# Patient Record
Sex: Female | Born: 1940 | Race: Black or African American | Hispanic: No | Smoking: Former smoker
Health system: Southern US, Community
[De-identification: ages and names within clinical notes are randomized; demographics above are authoritative.]

## PROBLEM LIST (undated history)

## (undated) DIAGNOSIS — C50919 Malignant neoplasm of unspecified site of unspecified female breast: Secondary | ICD-10-CM

## (undated) DIAGNOSIS — B952 Enterococcus as the cause of diseases classified elsewhere: Secondary | ICD-10-CM

## (undated) DIAGNOSIS — J449 Chronic obstructive pulmonary disease, unspecified: Secondary | ICD-10-CM

## (undated) DIAGNOSIS — I10 Essential (primary) hypertension: Secondary | ICD-10-CM

## (undated) DIAGNOSIS — E1122 Type 2 diabetes mellitus with diabetic chronic kidney disease: Secondary | ICD-10-CM

## (undated) DIAGNOSIS — N289 Disorder of kidney and ureter, unspecified: Secondary | ICD-10-CM

## (undated) DIAGNOSIS — I5022 Chronic systolic (congestive) heart failure: Secondary | ICD-10-CM

## (undated) DIAGNOSIS — I2510 Atherosclerotic heart disease of native coronary artery without angina pectoris: Secondary | ICD-10-CM

## (undated) DIAGNOSIS — M1990 Unspecified osteoarthritis, unspecified site: Secondary | ICD-10-CM

## (undated) DIAGNOSIS — K219 Gastro-esophageal reflux disease without esophagitis: Secondary | ICD-10-CM

## (undated) DIAGNOSIS — N186 End stage renal disease: Secondary | ICD-10-CM

## (undated) DIAGNOSIS — N189 Chronic kidney disease, unspecified: Secondary | ICD-10-CM

## (undated) DIAGNOSIS — R7881 Bacteremia: Secondary | ICD-10-CM

## (undated) DIAGNOSIS — G4730 Sleep apnea, unspecified: Secondary | ICD-10-CM

## (undated) HISTORY — DX: Enterococcus as the cause of diseases classified elsewhere: B95.2

## (undated) HISTORY — DX: Bacteremia: R78.81

## (undated) HISTORY — PX: BREAST SURGERY: SHX581

## (undated) HISTORY — DX: Atherosclerotic heart disease of native coronary artery without angina pectoris: I25.10

---

## 2010-03-24 DIAGNOSIS — I10 Essential (primary) hypertension: Secondary | ICD-10-CM | POA: Diagnosis present

## 2010-03-24 DIAGNOSIS — N289 Disorder of kidney and ureter, unspecified: Secondary | ICD-10-CM | POA: Insufficient documentation

## 2010-11-21 DIAGNOSIS — E119 Type 2 diabetes mellitus without complications: Secondary | ICD-10-CM | POA: Insufficient documentation

## 2010-11-21 DIAGNOSIS — N184 Chronic kidney disease, stage 4 (severe): Secondary | ICD-10-CM | POA: Insufficient documentation

## 2010-11-21 DIAGNOSIS — R609 Edema, unspecified: Secondary | ICD-10-CM | POA: Insufficient documentation

## 2012-07-15 DIAGNOSIS — C50412 Malignant neoplasm of upper-outer quadrant of left female breast: Secondary | ICD-10-CM | POA: Diagnosis present

## 2012-08-11 DIAGNOSIS — M1990 Unspecified osteoarthritis, unspecified site: Secondary | ICD-10-CM | POA: Diagnosis present

## 2012-08-18 DIAGNOSIS — Z9012 Acquired absence of left breast and nipple: Secondary | ICD-10-CM | POA: Insufficient documentation

## 2012-11-05 DIAGNOSIS — T451X5A Adverse effect of antineoplastic and immunosuppressive drugs, initial encounter: Secondary | ICD-10-CM | POA: Insufficient documentation

## 2012-11-06 DIAGNOSIS — Z418 Encounter for other procedures for purposes other than remedying health state: Secondary | ICD-10-CM | POA: Insufficient documentation

## 2012-12-12 DIAGNOSIS — G4733 Obstructive sleep apnea (adult) (pediatric): Secondary | ICD-10-CM | POA: Insufficient documentation

## 2012-12-12 DIAGNOSIS — J90 Pleural effusion, not elsewhere classified: Secondary | ICD-10-CM | POA: Insufficient documentation

## 2013-06-25 DIAGNOSIS — R911 Solitary pulmonary nodule: Secondary | ICD-10-CM | POA: Insufficient documentation

## 2013-06-25 DIAGNOSIS — R0600 Dyspnea, unspecified: Secondary | ICD-10-CM | POA: Diagnosis present

## 2014-08-02 DIAGNOSIS — I890 Lymphedema, not elsewhere classified: Secondary | ICD-10-CM | POA: Insufficient documentation

## 2014-10-12 ENCOUNTER — Inpatient Hospital Stay (HOSPITAL_COMMUNITY): Payer: Medicare (Managed Care)

## 2014-10-12 ENCOUNTER — Other Ambulatory Visit (HOSPITAL_COMMUNITY): Payer: Medicare Other

## 2014-10-12 ENCOUNTER — Emergency Department (HOSPITAL_COMMUNITY): Payer: Medicare (Managed Care)

## 2014-10-12 ENCOUNTER — Encounter (HOSPITAL_COMMUNITY): Payer: Self-pay | Admitting: Emergency Medicine

## 2014-10-12 ENCOUNTER — Inpatient Hospital Stay (HOSPITAL_COMMUNITY)
Admission: EM | Admit: 2014-10-12 | Discharge: 2014-10-27 | DRG: 264 | Disposition: A | Discharge status: Home Health | Payer: Medicare (Managed Care) | Attending: Internal Medicine | Admitting: Internal Medicine

## 2014-10-12 DIAGNOSIS — I472 Ventricular tachycardia: Secondary | ICD-10-CM | POA: Diagnosis not present

## 2014-10-12 DIAGNOSIS — E1122 Type 2 diabetes mellitus with diabetic chronic kidney disease: Secondary | ICD-10-CM | POA: Diagnosis present

## 2014-10-12 DIAGNOSIS — K219 Gastro-esophageal reflux disease without esophagitis: Secondary | ICD-10-CM | POA: Diagnosis present

## 2014-10-12 DIAGNOSIS — N141 Nephropathy induced by other drugs, medicaments and biological substances: Secondary | ICD-10-CM | POA: Diagnosis not present

## 2014-10-12 DIAGNOSIS — I214 Non-ST elevation (NSTEMI) myocardial infarction: Secondary | ICD-10-CM | POA: Diagnosis not present

## 2014-10-12 DIAGNOSIS — K761 Chronic passive congestion of liver: Secondary | ICD-10-CM | POA: Diagnosis present

## 2014-10-12 DIAGNOSIS — N189 Chronic kidney disease, unspecified: Secondary | ICD-10-CM | POA: Diagnosis not present

## 2014-10-12 DIAGNOSIS — C50912 Malignant neoplasm of unspecified site of left female breast: Secondary | ICD-10-CM

## 2014-10-12 DIAGNOSIS — Z79899 Other long term (current) drug therapy: Secondary | ICD-10-CM | POA: Diagnosis not present

## 2014-10-12 DIAGNOSIS — N186 End stage renal disease: Secondary | ICD-10-CM | POA: Diagnosis not present

## 2014-10-12 DIAGNOSIS — N179 Acute kidney failure, unspecified: Secondary | ICD-10-CM | POA: Insufficient documentation

## 2014-10-12 DIAGNOSIS — R55 Syncope and collapse: Secondary | ICD-10-CM | POA: Diagnosis present

## 2014-10-12 DIAGNOSIS — I5023 Acute on chronic systolic (congestive) heart failure: Secondary | ICD-10-CM | POA: Diagnosis present

## 2014-10-12 DIAGNOSIS — R52 Pain, unspecified: Secondary | ICD-10-CM | POA: Diagnosis not present

## 2014-10-12 DIAGNOSIS — Z923 Personal history of irradiation: Secondary | ICD-10-CM | POA: Diagnosis not present

## 2014-10-12 DIAGNOSIS — G4733 Obstructive sleep apnea (adult) (pediatric): Secondary | ICD-10-CM | POA: Diagnosis present

## 2014-10-12 DIAGNOSIS — R079 Chest pain, unspecified: Secondary | ICD-10-CM | POA: Diagnosis present

## 2014-10-12 DIAGNOSIS — Z823 Family history of stroke: Secondary | ICD-10-CM

## 2014-10-12 DIAGNOSIS — Z9221 Personal history of antineoplastic chemotherapy: Secondary | ICD-10-CM

## 2014-10-12 DIAGNOSIS — M1612 Unilateral primary osteoarthritis, left hip: Secondary | ICD-10-CM | POA: Diagnosis present

## 2014-10-12 DIAGNOSIS — J449 Chronic obstructive pulmonary disease, unspecified: Secondary | ICD-10-CM | POA: Diagnosis present

## 2014-10-12 DIAGNOSIS — I429 Cardiomyopathy, unspecified: Secondary | ICD-10-CM | POA: Diagnosis not present

## 2014-10-12 DIAGNOSIS — R778 Other specified abnormalities of plasma proteins: Secondary | ICD-10-CM | POA: Diagnosis present

## 2014-10-12 DIAGNOSIS — Z8249 Family history of ischemic heart disease and other diseases of the circulatory system: Secondary | ICD-10-CM

## 2014-10-12 DIAGNOSIS — I272 Other secondary pulmonary hypertension: Secondary | ICD-10-CM | POA: Diagnosis present

## 2014-10-12 DIAGNOSIS — I447 Left bundle-branch block, unspecified: Secondary | ICD-10-CM | POA: Diagnosis present

## 2014-10-12 DIAGNOSIS — Z853 Personal history of malignant neoplasm of breast: Secondary | ICD-10-CM | POA: Diagnosis not present

## 2014-10-12 DIAGNOSIS — I890 Lymphedema, not elsewhere classified: Secondary | ICD-10-CM | POA: Diagnosis present

## 2014-10-12 DIAGNOSIS — D638 Anemia in other chronic diseases classified elsewhere: Secondary | ICD-10-CM | POA: Diagnosis present

## 2014-10-12 DIAGNOSIS — E875 Hyperkalemia: Secondary | ICD-10-CM | POA: Diagnosis not present

## 2014-10-12 DIAGNOSIS — Z6841 Body Mass Index (BMI) 40.0 and over, adult: Secondary | ICD-10-CM | POA: Diagnosis not present

## 2014-10-12 DIAGNOSIS — I10 Essential (primary) hypertension: Secondary | ICD-10-CM | POA: Diagnosis not present

## 2014-10-12 DIAGNOSIS — I132 Hypertensive heart and chronic kidney disease with heart failure and with stage 5 chronic kidney disease, or end stage renal disease: Secondary | ICD-10-CM | POA: Diagnosis not present

## 2014-10-12 DIAGNOSIS — M1990 Unspecified osteoarthritis, unspecified site: Secondary | ICD-10-CM | POA: Diagnosis not present

## 2014-10-12 DIAGNOSIS — E119 Type 2 diabetes mellitus without complications: Secondary | ICD-10-CM | POA: Insufficient documentation

## 2014-10-12 DIAGNOSIS — J9621 Acute and chronic respiratory failure with hypoxia: Secondary | ICD-10-CM

## 2014-10-12 DIAGNOSIS — Z794 Long term (current) use of insulin: Secondary | ICD-10-CM | POA: Diagnosis not present

## 2014-10-12 DIAGNOSIS — E785 Hyperlipidemia, unspecified: Secondary | ICD-10-CM | POA: Diagnosis present

## 2014-10-12 DIAGNOSIS — T508X5A Adverse effect of diagnostic agents, initial encounter: Secondary | ICD-10-CM | POA: Diagnosis not present

## 2014-10-12 DIAGNOSIS — N184 Chronic kidney disease, stage 4 (severe): Secondary | ICD-10-CM | POA: Diagnosis not present

## 2014-10-12 DIAGNOSIS — R7989 Other specified abnormal findings of blood chemistry: Secondary | ICD-10-CM | POA: Diagnosis not present

## 2014-10-12 DIAGNOSIS — E1165 Type 2 diabetes mellitus with hyperglycemia: Secondary | ICD-10-CM | POA: Diagnosis present

## 2014-10-12 DIAGNOSIS — R0602 Shortness of breath: Secondary | ICD-10-CM | POA: Diagnosis not present

## 2014-10-12 DIAGNOSIS — I2510 Atherosclerotic heart disease of native coronary artery without angina pectoris: Secondary | ICD-10-CM | POA: Diagnosis present

## 2014-10-12 DIAGNOSIS — I5021 Acute systolic (congestive) heart failure: Secondary | ICD-10-CM | POA: Diagnosis present

## 2014-10-12 DIAGNOSIS — M25552 Pain in left hip: Secondary | ICD-10-CM | POA: Insufficient documentation

## 2014-10-12 DIAGNOSIS — N185 Chronic kidney disease, stage 5: Secondary | ICD-10-CM | POA: Diagnosis not present

## 2014-10-12 DIAGNOSIS — C50412 Malignant neoplasm of upper-outer quadrant of left female breast: Secondary | ICD-10-CM | POA: Diagnosis present

## 2014-10-12 DIAGNOSIS — R0600 Dyspnea, unspecified: Secondary | ICD-10-CM | POA: Diagnosis present

## 2014-10-12 DIAGNOSIS — J42 Unspecified chronic bronchitis: Secondary | ICD-10-CM

## 2014-10-12 DIAGNOSIS — I509 Heart failure, unspecified: Secondary | ICD-10-CM

## 2014-10-12 DIAGNOSIS — J9620 Acute and chronic respiratory failure, unspecified whether with hypoxia or hypercapnia: Secondary | ICD-10-CM | POA: Diagnosis not present

## 2014-10-12 DIAGNOSIS — I255 Ischemic cardiomyopathy: Secondary | ICD-10-CM | POA: Diagnosis present

## 2014-10-12 DIAGNOSIS — Z992 Dependence on renal dialysis: Secondary | ICD-10-CM

## 2014-10-12 HISTORY — DX: Disorder of kidney and ureter, unspecified: N28.9

## 2014-10-12 HISTORY — DX: Essential (primary) hypertension: I10

## 2014-10-12 HISTORY — DX: Chronic kidney disease, unspecified: N18.9

## 2014-10-12 HISTORY — DX: Malignant neoplasm of unspecified site of unspecified female breast: C50.919

## 2014-10-12 HISTORY — DX: Unspecified osteoarthritis, unspecified site: M19.90

## 2014-10-12 HISTORY — DX: Chronic obstructive pulmonary disease, unspecified: J44.9

## 2014-10-12 HISTORY — DX: Gastro-esophageal reflux disease without esophagitis: K21.9

## 2014-10-12 LAB — I-STAT ARTERIAL BLOOD GAS, ED
Acid-base deficit: 3 mmol/L — ABNORMAL HIGH (ref 0.0–2.0)
Bicarbonate: 25.4 mEq/L — ABNORMAL HIGH (ref 20.0–24.0)
O2 Saturation: 100 %
PCO2 ART: 64.1 mmHg — AB (ref 35.0–45.0)
Patient temperature: 98.6
TCO2: 27 mmol/L (ref 0–100)
pH, Arterial: 7.206 — ABNORMAL LOW (ref 7.350–7.450)
pO2, Arterial: 252 mmHg — ABNORMAL HIGH (ref 80.0–100.0)

## 2014-10-12 LAB — URINALYSIS, ROUTINE W REFLEX MICROSCOPIC
Bilirubin Urine: NEGATIVE
Glucose, UA: 250 mg/dL — AB
Ketones, ur: NEGATIVE mg/dL
Leukocytes, UA: NEGATIVE
Nitrite: NEGATIVE
PH: 5 (ref 5.0–8.0)
SPECIFIC GRAVITY, URINE: 1.01 (ref 1.005–1.030)
Urobilinogen, UA: 0.2 mg/dL (ref 0.0–1.0)

## 2014-10-12 LAB — HEPARIN LEVEL (UNFRACTIONATED): HEPARIN UNFRACTIONATED: 0.15 [IU]/mL — AB (ref 0.30–0.70)

## 2014-10-12 LAB — COMPREHENSIVE METABOLIC PANEL
ALT: 37 U/L (ref 14–54)
ANION GAP: 14 (ref 5–15)
AST: 59 U/L — ABNORMAL HIGH (ref 15–41)
Albumin: 3.1 g/dL — ABNORMAL LOW (ref 3.5–5.0)
Alkaline Phosphatase: 101 U/L (ref 38–126)
BUN: 42 mg/dL — AB (ref 6–20)
CHLORIDE: 103 mmol/L (ref 101–111)
CO2: 20 mmol/L — ABNORMAL LOW (ref 22–32)
Calcium: 7.2 mg/dL — ABNORMAL LOW (ref 8.9–10.3)
Creatinine, Ser: 3.6 mg/dL — ABNORMAL HIGH (ref 0.44–1.00)
GFR calc non Af Amer: 12 mL/min — ABNORMAL LOW (ref >60)
GFR, EST AFRICAN AMERICAN: 14 mL/min — AB (ref >60)
Glucose, Bld: 352 mg/dL — ABNORMAL HIGH (ref 65–99)
POTASSIUM: 4.3 mmol/L (ref 3.5–5.1)
Sodium: 137 mmol/L (ref 135–145)
TOTAL PROTEIN: 6.9 g/dL (ref 6.5–8.1)
Total Bilirubin: 0.2 mg/dL — ABNORMAL LOW (ref 0.3–1.2)

## 2014-10-12 LAB — CBC WITH DIFFERENTIAL/PLATELET
Basophils Absolute: 0 10*3/uL (ref 0.0–0.1)
Basophils Relative: 0 % (ref 0–1)
Eosinophils Absolute: 0.1 10*3/uL (ref 0.0–0.7)
Eosinophils Relative: 2 % (ref 0–5)
HEMATOCRIT: 36 % (ref 36.0–46.0)
HEMOGLOBIN: 11.1 g/dL — AB (ref 12.0–15.0)
Lymphocytes Relative: 30 % (ref 12–46)
Lymphs Abs: 2.4 10*3/uL (ref 0.7–4.0)
MCH: 25.6 pg — AB (ref 26.0–34.0)
MCHC: 30.8 g/dL (ref 30.0–36.0)
MCV: 83.1 fL (ref 78.0–100.0)
MONOS PCT: 6 % (ref 3–12)
Monocytes Absolute: 0.5 10*3/uL (ref 0.1–1.0)
NEUTROS PCT: 62 % (ref 43–77)
Neutro Abs: 4.9 10*3/uL (ref 1.7–7.7)
Platelets: 278 10*3/uL (ref 150–400)
RBC: 4.33 MIL/uL (ref 3.87–5.11)
RDW: 13.4 % (ref 11.5–15.5)
WBC: 8 10*3/uL (ref 4.0–10.5)

## 2014-10-12 LAB — LIPID PANEL
CHOL/HDL RATIO: 3.8 ratio
Cholesterol: 199 mg/dL (ref 0–200)
HDL: 53 mg/dL (ref >40)
LDL CALC: 123 mg/dL — AB (ref 0–99)
Triglycerides: 113 mg/dL (ref <150)
VLDL: 23 mg/dL (ref 0–40)

## 2014-10-12 LAB — GLUCOSE, CAPILLARY
GLUCOSE-CAPILLARY: 288 mg/dL — AB (ref 65–99)
GLUCOSE-CAPILLARY: 360 mg/dL — AB (ref 65–99)
GLUCOSE-CAPILLARY: 350 mg/dL — AB (ref 65–99)
Glucose-Capillary: 346 mg/dL — ABNORMAL HIGH (ref 65–99)

## 2014-10-12 LAB — TROPONIN I
TROPONIN I: 0.51 ng/mL — AB (ref <0.031)
TROPONIN I: 0.6 ng/mL — AB (ref <0.031)
Troponin I: 0.58 ng/mL (ref <0.031)

## 2014-10-12 LAB — MRSA PCR SCREENING: MRSA by PCR: NEGATIVE

## 2014-10-12 LAB — CREATININE, URINE, RANDOM: Creatinine, Urine: 37.77 mg/dL

## 2014-10-12 LAB — I-STAT TROPONIN, ED: Troponin i, poc: 0.03 ng/mL (ref 0.00–0.08)

## 2014-10-12 LAB — APTT: aPTT: 33 seconds (ref 24–37)

## 2014-10-12 LAB — NA AND K (SODIUM & POTASSIUM), RAND UR
POTASSIUM UR: 22 mmol/L
Sodium, Ur: 117 mmol/L

## 2014-10-12 LAB — PROTIME-INR
INR: 1.06 (ref 0.00–1.49)
PROTHROMBIN TIME: 14 s (ref 11.6–15.2)

## 2014-10-12 LAB — URINE MICROSCOPIC-ADD ON

## 2014-10-12 LAB — BRAIN NATRIURETIC PEPTIDE: B NATRIURETIC PEPTIDE 5: 143.8 pg/mL — AB (ref 0.0–100.0)

## 2014-10-12 MED ORDER — GABAPENTIN 300 MG PO CAPS
300.0000 mg | ORAL_CAPSULE | Freq: Two times a day (BID) | ORAL | Status: DC
  Administered 2014-10-12 – 2014-10-17 (×11): 300 mg via ORAL
  Filled 2014-10-12 (×12): qty 1

## 2014-10-12 MED ORDER — INSULIN ASPART 100 UNIT/ML ~~LOC~~ SOLN
0.0000 [IU] | Freq: Every day | SUBCUTANEOUS | Status: DC
  Administered 2014-10-12: 4 [IU] via SUBCUTANEOUS
  Administered 2014-10-13: 5 [IU] via SUBCUTANEOUS
  Administered 2014-10-14: 4 [IU] via SUBCUTANEOUS
  Administered 2014-10-15: 3 [IU] via SUBCUTANEOUS
  Administered 2014-10-18: 2 [IU] via SUBCUTANEOUS

## 2014-10-12 MED ORDER — CLONIDINE HCL 0.2 MG PO TABS
0.2000 mg | ORAL_TABLET | Freq: Three times a day (TID) | ORAL | Status: DC
  Administered 2014-10-12 – 2014-10-13 (×4): 0.2 mg via ORAL
  Filled 2014-10-12 (×9): qty 1

## 2014-10-12 MED ORDER — CARVEDILOL 6.25 MG PO TABS
6.2500 mg | ORAL_TABLET | Freq: Two times a day (BID) | ORAL | Status: DC
  Administered 2014-10-12 – 2014-10-15 (×7): 6.25 mg via ORAL
  Filled 2014-10-12 (×9): qty 1

## 2014-10-12 MED ORDER — ALBUTEROL SULFATE (2.5 MG/3ML) 0.083% IN NEBU
5.0000 mg | INHALATION_SOLUTION | Freq: Once | RESPIRATORY_TRACT | Status: AC
  Administered 2014-10-12: 5 mg via RESPIRATORY_TRACT
  Filled 2014-10-12: qty 6

## 2014-10-12 MED ORDER — CHLORHEXIDINE GLUCONATE 0.12 % MT SOLN
15.0000 mL | Freq: Two times a day (BID) | OROMUCOSAL | Status: DC
  Administered 2014-10-12: 15 mL via OROMUCOSAL
  Filled 2014-10-12: qty 15

## 2014-10-12 MED ORDER — SODIUM CHLORIDE 0.9 % IJ SOLN
3.0000 mL | Freq: Two times a day (BID) | INTRAMUSCULAR | Status: DC
  Administered 2014-10-13 – 2014-10-25 (×10): 3 mL via INTRAVENOUS

## 2014-10-12 MED ORDER — IPRATROPIUM-ALBUTEROL 0.5-2.5 (3) MG/3ML IN SOLN
3.0000 mL | RESPIRATORY_TRACT | Status: DC
  Administered 2014-10-12: 3 mL via RESPIRATORY_TRACT
  Filled 2014-10-12: qty 3

## 2014-10-12 MED ORDER — CAPSAICIN 0.025 % EX CREA: 1.0000 "application " | TOPICAL_CREAM | Freq: Two times a day (BID) | CUTANEOUS | Status: DC | PRN

## 2014-10-12 MED ORDER — ADULT MULTIVITAMIN W/MINERALS CH
1.0000 | ORAL_TABLET | Freq: Every day | ORAL | Status: DC
  Administered 2014-10-12 – 2014-10-27 (×15): 1 via ORAL
  Filled 2014-10-12 (×16): qty 1

## 2014-10-12 MED ORDER — INSULIN DETEMIR 100 UNIT/ML ~~LOC~~ SOLN
70.0000 [IU] | Freq: Every day | SUBCUTANEOUS | Status: DC
  Filled 2014-10-12: qty 0.7

## 2014-10-12 MED ORDER — METHYLPREDNISOLONE SODIUM SUCC 125 MG IJ SOLR
60.0000 mg | Freq: Every day | INTRAMUSCULAR | Status: DC
  Administered 2014-10-12 – 2014-10-13 (×2): 60 mg via INTRAVENOUS
  Filled 2014-10-12: qty 0.96
  Filled 2014-10-12: qty 2

## 2014-10-12 MED ORDER — ALBUTEROL SULFATE (2.5 MG/3ML) 0.083% IN NEBU: 5.0000 mg | INHALATION_SOLUTION | RESPIRATORY_TRACT | Status: DC | PRN

## 2014-10-12 MED ORDER — METHYLPREDNISOLONE SODIUM SUCC 125 MG IJ SOLR
125.0000 mg | Freq: Once | INTRAMUSCULAR | Status: AC
  Administered 2014-10-12: 125 mg via INTRAVENOUS

## 2014-10-12 MED ORDER — FUROSEMIDE 10 MG/ML IJ SOLN
40.0000 mg | Freq: Once | INTRAMUSCULAR | Status: AC
  Administered 2014-10-12: 40 mg via INTRAVENOUS
  Filled 2014-10-12: qty 4

## 2014-10-12 MED ORDER — HEPARIN BOLUS VIA INFUSION
2000.0000 [IU] | Freq: Once | INTRAVENOUS | Status: AC
  Administered 2014-10-12: 2000 [IU] via INTRAVENOUS
  Filled 2014-10-12: qty 2000

## 2014-10-12 MED ORDER — PANTOPRAZOLE SODIUM 40 MG PO TBEC
40.0000 mg | DELAYED_RELEASE_TABLET | Freq: Every day | ORAL | Status: DC
  Administered 2014-10-12 – 2014-10-27 (×15): 40 mg via ORAL
  Filled 2014-10-12 (×13): qty 1

## 2014-10-12 MED ORDER — METHYLPREDNISOLONE SODIUM SUCC 125 MG IJ SOLR
INTRAMUSCULAR | Status: AC
  Filled 2014-10-12: qty 2

## 2014-10-12 MED ORDER — SODIUM CHLORIDE 0.9 % IJ SOLN
3.0000 mL | INTRAMUSCULAR | Status: DC | PRN
  Administered 2014-10-12: 3 mL via INTRAVENOUS
  Filled 2014-10-12: qty 3

## 2014-10-12 MED ORDER — INSULIN DETEMIR 100 UNIT/ML ~~LOC~~ SOLN
35.0000 [IU] | Freq: Every day | SUBCUTANEOUS | Status: DC
  Administered 2014-10-13: 35 [IU] via SUBCUTANEOUS
  Filled 2014-10-12: qty 0.35

## 2014-10-12 MED ORDER — ACETAMINOPHEN 325 MG PO TABS
650.0000 mg | ORAL_TABLET | ORAL | Status: DC | PRN
  Filled 2014-10-12: qty 2

## 2014-10-12 MED ORDER — FUROSEMIDE 10 MG/ML IJ SOLN
40.0000 mg | Freq: Two times a day (BID) | INTRAMUSCULAR | Status: DC
  Administered 2014-10-12 – 2014-10-14 (×5): 40 mg via INTRAVENOUS
  Filled 2014-10-12 (×7): qty 4

## 2014-10-12 MED ORDER — HEPARIN SODIUM (PORCINE) 5000 UNIT/ML IJ SOLN
5000.0000 [IU] | Freq: Three times a day (TID) | INTRAMUSCULAR | Status: DC
  Administered 2014-10-12: 5000 [IU] via SUBCUTANEOUS
  Filled 2014-10-12: qty 1

## 2014-10-12 MED ORDER — GUAIFENESIN-CODEINE 100-10 MG/5ML PO SOLN
10.0000 mL | Freq: Four times a day (QID) | ORAL | Status: DC | PRN
  Administered 2014-10-14: 10 mL via ORAL
  Filled 2014-10-12: qty 10

## 2014-10-12 MED ORDER — HEPARIN (PORCINE) IN NACL 100-0.45 UNIT/ML-% IJ SOLN
1900.0000 [IU]/h | INTRAMUSCULAR | Status: DC
  Administered 2014-10-12: 1100 [IU]/h via INTRAVENOUS
  Administered 2014-10-13: 1450 [IU]/h via INTRAVENOUS
  Administered 2014-10-13 – 2014-10-15 (×2): 1400 [IU]/h via INTRAVENOUS
  Administered 2014-10-16: 1700 [IU]/h via INTRAVENOUS
  Administered 2014-10-16: 1750 [IU]/h via INTRAVENOUS
  Administered 2014-10-17 – 2014-10-19 (×2): 1900 [IU]/h via INTRAVENOUS
  Filled 2014-10-12 (×21): qty 250

## 2014-10-12 MED ORDER — CETYLPYRIDINIUM CHLORIDE 0.05 % MT LIQD
7.0000 mL | Freq: Two times a day (BID) | OROMUCOSAL | Status: DC
  Administered 2014-10-12 (×2): 7 mL via OROMUCOSAL

## 2014-10-12 MED ORDER — CETYLPYRIDINIUM CHLORIDE 0.05 % MT LIQD
7.0000 mL | Freq: Two times a day (BID) | OROMUCOSAL | Status: DC
  Administered 2014-10-12 – 2014-10-27 (×23): 7 mL via OROMUCOSAL

## 2014-10-12 MED ORDER — PERFLUTREN LIPID MICROSPHERE
1.0000 mL | INTRAVENOUS | Status: AC | PRN
  Administered 2014-10-12: 2 mL via INTRAVENOUS
  Filled 2014-10-12: qty 10

## 2014-10-12 MED ORDER — ALBUTEROL SULFATE (2.5 MG/3ML) 0.083% IN NEBU
INHALATION_SOLUTION | RESPIRATORY_TRACT | Status: AC
  Filled 2014-10-12: qty 6

## 2014-10-12 MED ORDER — ASPIRIN EC 81 MG PO TBEC
81.0000 mg | DELAYED_RELEASE_TABLET | Freq: Every day | ORAL | Status: DC
  Administered 2014-10-12 – 2014-10-27 (×15): 81 mg via ORAL
  Filled 2014-10-12 (×17): qty 1

## 2014-10-12 MED ORDER — SODIUM CHLORIDE 0.9 % IV SOLN
250.0000 mL | INTRAVENOUS | Status: DC | PRN
Start: 2014-10-12 — End: 2014-10-27

## 2014-10-12 MED ORDER — ONDANSETRON HCL 4 MG/2ML IJ SOLN
4.0000 mg | Freq: Four times a day (QID) | INTRAMUSCULAR | Status: DC | PRN
  Administered 2014-10-15 – 2014-10-26 (×3): 4 mg via INTRAVENOUS
  Filled 2014-10-12 (×2): qty 2

## 2014-10-12 MED ORDER — IPRATROPIUM-ALBUTEROL 0.5-2.5 (3) MG/3ML IN SOLN
3.0000 mL | Freq: Three times a day (TID) | RESPIRATORY_TRACT | Status: DC
  Administered 2014-10-12 – 2014-10-17 (×17): 3 mL via RESPIRATORY_TRACT
  Filled 2014-10-12 (×17): qty 3

## 2014-10-12 MED ORDER — INSULIN ASPART 100 UNIT/ML ~~LOC~~ SOLN
0.0000 [IU] | Freq: Three times a day (TID) | SUBCUTANEOUS | Status: DC
  Administered 2014-10-12: 11 [IU] via SUBCUTANEOUS
  Administered 2014-10-12: 8 [IU] via SUBCUTANEOUS
  Administered 2014-10-12: 15 [IU] via SUBCUTANEOUS
  Administered 2014-10-13: 8 [IU] via SUBCUTANEOUS
  Administered 2014-10-13 (×2): 11 [IU] via SUBCUTANEOUS
  Administered 2014-10-14: 8 [IU] via SUBCUTANEOUS
  Administered 2014-10-14: 5 [IU] via SUBCUTANEOUS
  Administered 2014-10-14: 8 [IU] via SUBCUTANEOUS
  Administered 2014-10-15: 3 [IU] via SUBCUTANEOUS
  Administered 2014-10-15: 8 [IU] via SUBCUTANEOUS
  Administered 2014-10-15: 3 [IU] via SUBCUTANEOUS
  Administered 2014-10-16 (×3): 2 [IU] via SUBCUTANEOUS
  Administered 2014-10-17: 3 [IU] via SUBCUTANEOUS
  Administered 2014-10-17 (×2): 5 [IU] via SUBCUTANEOUS
  Administered 2014-10-18 (×2): 3 [IU] via SUBCUTANEOUS
  Administered 2014-10-18: 12 [IU] via SUBCUTANEOUS
  Administered 2014-10-19 (×2): 3 [IU] via SUBCUTANEOUS
  Administered 2014-10-19: 5 [IU] via SUBCUTANEOUS
  Administered 2014-10-20 (×3): 3 [IU] via SUBCUTANEOUS
  Administered 2014-10-21: 2 [IU] via SUBCUTANEOUS
  Administered 2014-10-21: 5 [IU] via SUBCUTANEOUS
  Administered 2014-10-22 (×2): 2 [IU] via SUBCUTANEOUS
  Administered 2014-10-23: 5 [IU] via SUBCUTANEOUS
  Administered 2014-10-23 – 2014-10-24 (×3): 2 [IU] via SUBCUTANEOUS
  Administered 2014-10-24: 3 [IU] via SUBCUTANEOUS
  Administered 2014-10-24: 2 [IU] via SUBCUTANEOUS
  Administered 2014-10-25: 3 [IU] via SUBCUTANEOUS
  Administered 2014-10-26: 5 [IU] via SUBCUTANEOUS
  Administered 2014-10-26: 2 [IU] via SUBCUTANEOUS
  Administered 2014-10-27 (×3): 3 [IU] via SUBCUTANEOUS

## 2014-10-12 MED ORDER — HYDROCODONE-ACETAMINOPHEN 5-325 MG PO TABS
1.0000 | ORAL_TABLET | Freq: Four times a day (QID) | ORAL | Status: DC | PRN
  Administered 2014-10-13 – 2014-10-15 (×6): 1 via ORAL
  Filled 2014-10-12 (×6): qty 1

## 2014-10-12 MED ORDER — NITROGLYCERIN IN D5W 200-5 MCG/ML-% IV SOLN
10.0000 ug/min | INTRAVENOUS | Status: DC
  Administered 2014-10-12: 10 ug/min via INTRAVENOUS
  Filled 2014-10-12 (×2): qty 250

## 2014-10-12 NOTE — ED Provider Notes (Signed)
CSN: [id number]     Arrival date & time 10/12/14  0045 History   None    This chart was scribed for Julianne Rice, MD by Forrestine Him, ED Scribe. This patient was seen in room [facility] and the patient's care was started 12:48 AM.   Chief Complaint  Patient presents with  . Respiratory Distress   HPI  LEVEL 5 CAVEAT DUE TO SEVERE RESPIRATORY DISTRESS  HPI Comments: Jordan [name] is a 74 y.o. female with a PMHx of CHF and A-Fib who presents to the Emergency Department here for constant, ongoing, unchanged respiratory distress this evening. Pt is from out of town and was found unresponsive prior to arrival. Pt was given 5 mg of albuterol breathing treatment en route to department. No known allergies to medications.  Past Medical History  Diagnosis Date  . Breast cancer   . Diabetes mellitus without complication   . Hypertension   . Arthritis   . COPD (chronic obstructive pulmonary disease)   . Renal disorder     Family reports acute renal failure  . GERD (gastroesophageal reflux disease)   . CKD (chronic kidney disease)    Past Surgical History  Procedure Laterality Date  . Breast surgery Left    Family History  Problem Relation Age of Onset  . Diabetes Mother   . Hypertension Mother   . Stroke Sister   . Hypertension Sister    History  Substance Use Topics  . Smoking status: Never Smoker   . Smokeless tobacco: Not on file  . Alcohol Use: No   OB History    No data available     Review of Systems  Unable to perform ROS: Severe respiratory distress      Allergies  Review of patient's allergies indicates no known allergies.  Home Medications   Prior to Admission medications   Medication Sig Start Date End Date Taking? Authorizing Provider  bumetanide (BUMEX) 2 MG tablet Take 2 mg by mouth 2 (two) times daily.   Yes Historical Provider, MD  capsaicin (ZOSTRIX) 0.025 % cream Apply 1 application topically 2 (two) times daily as needed (pain).   Yes  Historical Provider, MD  carvedilol (COREG) 6.25 MG tablet Take 6.25 mg by mouth 2 (two) times daily with a meal.   Yes Historical Provider, MD  cloNIDine (CATAPRES) 0.2 MG tablet Take 0.2 mg by mouth 3 (three) times daily.   Yes Historical Provider, MD  Dextromethorphan Polistirex (DELSYM PO) Take 10 mLs by mouth every 6 (six) hours as needed (cough).   Yes Historical Provider, MD  gabapentin (NEURONTIN) 300 MG capsule Take 300 mg by mouth 2 (two) times daily.   Yes Historical Provider, MD  guaiFENesin-codeine (ROBITUSSIN AC) 100-10 MG/5ML syrup Take 10 mLs by mouth every 6 (six) hours as needed for cough.   Yes Historical Provider, MD  HYDROcodone-acetaminophen (NORCO/VICODIN) 5-325 MG per tablet Take 1 tablet by mouth every 6 (six) hours as needed for moderate pain.   Yes Historical Provider, MD  Insulin Detemir (LEVEMIR FLEXPEN) 100 UNIT/ML Pen Inject 70 Units into the skin every morning.   Yes Historical Provider, MD  insulin lispro (HUMALOG KWIKPEN) 100 UNIT/ML KiwkPen Inject 6-12 Units into the skin 3 (three) times daily as needed (blood sugar).   Yes Historical Provider, MD  Multiple Vitamins-Minerals (CENTRUM SILVER ADULT 50+ PO) Take 1 tablet by mouth daily.   Yes Historical Provider, MD  omeprazole (PRILOSEC) 40 MG capsule Take 40 mg by mouth daily.  Yes Historical Provider, MD  OVER THE COUNTER MEDICATION Place 2-3 tablets under the tongue every 4 (four) hours as needed (restful legs).   Yes Historical Provider, MD  promethazine (PHENERGAN) 12.5 MG tablet Take 12.5 mg by mouth every 6 (six) hours as needed for nausea or vomiting.   Yes Historical Provider, MD  Tiotropium Bromide-Olodaterol (STIOLTO RESPIMAT) 2.5-2.5 MCG/ACT AERS Inhale 2 puffs into the lungs 2 (two) times daily as needed (shortness of breath).    Yes Historical Provider, MD   Triage Vitals: BP 152/50 mmHg  Pulse 75  Temp(Src) 98.4 F (36.9 C) (Oral)  Resp 17  Ht 5\' 5"  (1.651 m)  Wt 320 lb 5.3 oz (145.3 kg)  BMI  53.31 kg/m2  SpO2 98%  Physical Exam  Constitutional: She is oriented to person, place, and time. She appears well-developed and well-nourished. She appears distressed.  HENT:  Head: Normocephalic and atraumatic.  Mouth/Throat: Oropharynx is clear and moist.  Eyes: EOM are normal. Pupils are equal, round, and reactive to light.  Neck: Normal range of motion. Neck supple.  Cardiovascular: Normal rate and regular rhythm.   Pulmonary/Chest: She is in respiratory distress. She has wheezes. She has no rales.  Increased respiratory effort. Using accessory muscles. Extremities and throughout.  Abdominal: Soft. Bowel sounds are normal.  Musculoskeletal: Normal range of motion. She exhibits edema. She exhibits no tenderness.  2+ bilateral pitting edema.  Neurological: She is alert and oriented to person, place, and time.  Moves all extremities. Sensation is grossly intact.  Skin: Skin is warm and dry. No rash noted. No erythema.  Psychiatric: She has a normal mood and affect. Her behavior is normal.  Nursing note and vitals reviewed.   ED Course  Procedures (including critical care time)  DIAGNOSTIC STUDIES: Oxygen Saturation is 60% on RA, low by my interpretation.    COORDINATION OF CARE: 12:50 AM- Will give Lasix, Nitroglycerin, Solu-Medrol, Proventil, CBC, CMP, BNP, i-stat Troponin, and EKG. . Discussed treatment plan with pt at bedside and pt agreed to plan.     Labs Review Labs Reviewed  CBC WITH DIFFERENTIAL/PLATELET - Abnormal; Notable for the following:    Hemoglobin 11.1 (*)    MCH 25.6 (*)    All other components within normal limits  COMPREHENSIVE METABOLIC PANEL - Abnormal; Notable for the following:    CO2 20 (*)    Glucose, Bld 352 (*)    BUN 42 (*)    Creatinine, Ser 3.60 (*)    Calcium 7.2 (*)    Albumin 3.1 (*)    AST 59 (*)    Total Bilirubin 0.2 (*)    GFR calc non Af Amer 12 (*)    GFR calc Af Amer 14 (*)    All other components within normal limits  BRAIN  NATRIURETIC PEPTIDE - Abnormal; Notable for the following:    B Natriuretic Peptide 143.8 (*)    All other components within normal limits  HEMOGLOBIN A1C - Abnormal; Notable for the following:    Hgb A1c MFr Bld 9.4 (*)    All other components within normal limits  LIPID PANEL - Abnormal; Notable for the following:    LDL Cholesterol 123 (*)    All other components within normal limits  TROPONIN I - Abnormal; Notable for the following:    Troponin I 0.51 (*)    All other components within normal limits  TROPONIN I - Abnormal; Notable for the following:    Troponin I 0.58 (*)  All other components within normal limits  TROPONIN I - Abnormal; Notable for the following:    Troponin I 0.60 (*)    All other components within normal limits  GLUCOSE, CAPILLARY - Abnormal; Notable for the following:    Glucose-Capillary 288 (*)    All other components within normal limits  URINALYSIS, ROUTINE W REFLEX MICROSCOPIC (NOT AT [facility]) - Abnormal; Notable for the following:    Glucose, UA 250 (*)    Hgb urine dipstick SMALL (*)    Protein, ur >300 (*)    All other components within normal limits  URINE MICROSCOPIC-ADD ON - Abnormal; Notable for the following:    Bacteria, UA MANY (*)    Casts GRANULAR CAST (*)    All other components within normal limits  GLUCOSE, CAPILLARY - Abnormal; Notable for the following:    Glucose-Capillary 360 (*)    All other components within normal limits  HEPARIN LEVEL (UNFRACTIONATED) - Abnormal; Notable for the following:    Heparin Unfractionated 0.15 (*)    All other components within normal limits  GLUCOSE, CAPILLARY - Abnormal; Notable for the following:    Glucose-Capillary 350 (*)    All other components within normal limits  GLUCOSE, CAPILLARY - Abnormal; Notable for the following:    Glucose-Capillary 346 (*)    All other components within normal limits  BASIC METABOLIC PANEL - Abnormal; Notable for the following:    Chloride 99 (*)    Glucose,  Bld 300 (*)    BUN 53 (*)    Creatinine, Ser 3.90 (*)    Calcium 7.4 (*)    GFR calc non Af Amer 11 (*)    GFR calc Af Amer 12 (*)    All other components within normal limits  CBC - Abnormal; Notable for the following:    WBC 14.5 (*)    Hemoglobin 10.2 (*)    HCT 32.9 (*)    MCH 25.8 (*)    All other components within normal limits  GLUCOSE, CAPILLARY - Abnormal; Notable for the following:    Glucose-Capillary 328 (*)    All other components within normal limits  GLUCOSE, CAPILLARY - Abnormal; Notable for the following:    Glucose-Capillary 296 (*)    All other components within normal limits  BASIC METABOLIC PANEL - Abnormal; Notable for the following:    Sodium 134 (*)    Chloride 96 (*)    Glucose, Bld 316 (*)    BUN 59 (*)    Creatinine, Ser 4.14 (*)    Calcium 7.0 (*)    GFR calc non Af Amer 10 (*)    GFR calc Af Amer 12 (*)    All other components within normal limits  CBC - Abnormal; Notable for the following:    WBC 12.5 (*)    RBC 3.70 (*)    Hemoglobin 9.3 (*)    HCT 30.6 (*)    MCH 25.1 (*)    All other components within normal limits  GLUCOSE, CAPILLARY - Abnormal; Notable for the following:    Glucose-Capillary 319 (*)    All other components within normal limits  GLUCOSE, CAPILLARY - Abnormal; Notable for the following:    Glucose-Capillary 383 (*)    All other components within normal limits  GLUCOSE, CAPILLARY - Abnormal; Notable for the following:    Glucose-Capillary 257 (*)    All other components within normal limits  GLUCOSE, CAPILLARY - Abnormal; Notable for the following:    Glucose-Capillary  218 (*)    All other components within normal limits  BASIC METABOLIC PANEL - Abnormal; Notable for the following:    Glucose, Bld 239 (*)    BUN 71 (*)    Creatinine, Ser 4.03 (*)    Calcium 6.9 (*)    GFR calc non Af Amer 10 (*)    GFR calc Af Amer 12 (*)    All other components within normal limits  CBC - Abnormal; Notable for the following:     WBC 12.9 (*)    RBC 3.76 (*)    Hemoglobin 9.5 (*)    HCT 30.9 (*)    MCH 25.3 (*)    All other components within normal limits  GLUCOSE, CAPILLARY - Abnormal; Notable for the following:    Glucose-Capillary 287 (*)    All other components within normal limits  GLUCOSE, CAPILLARY - Abnormal; Notable for the following:    Glucose-Capillary 309 (*)    All other components within normal limits  GLUCOSE, CAPILLARY - Abnormal; Notable for the following:    Glucose-Capillary 253 (*)    All other components within normal limits  GLUCOSE, CAPILLARY - Abnormal; Notable for the following:    Glucose-Capillary 186 (*)    All other components within normal limits  CBC - Abnormal; Notable for the following:    RBC 3.64 (*)    Hemoglobin 9.5 (*)    HCT 30.7 (*)    All other components within normal limits  HEPARIN LEVEL (UNFRACTIONATED) - Abnormal; Notable for the following:    Heparin Unfractionated 0.18 (*)    All other components within normal limits  GLUCOSE, CAPILLARY - Abnormal; Notable for the following:    Glucose-Capillary 191 (*)    All other components within normal limits  GLUCOSE, CAPILLARY - Abnormal; Notable for the following:    Glucose-Capillary 252 (*)    All other components within normal limits  I-STAT ARTERIAL BLOOD GAS, ED - Abnormal; Notable for the following:    pH, Arterial 7.206 (*)    pCO2 arterial 64.1 (*)    pO2, Arterial 252.0 (*)    Bicarbonate 25.4 (*)    Acid-base deficit 3.0 (*)    All other components within normal limits  MRSA PCR SCREENING  CREATININE, URINE, RANDOM  PROTIME-INR  APTT  NA AND K (SODIUM & POTASSIUM), RAND UR  HEPARIN LEVEL (UNFRACTIONATED)  HEPARIN LEVEL (UNFRACTIONATED)  HEPARIN LEVEL (UNFRACTIONATED)  HEPARIN LEVEL (UNFRACTIONATED)  PROTIME-INR  HEPARIN LEVEL (UNFRACTIONATED)  BASIC METABOLIC PANEL  I-STAT TROPOININ, ED    Imaging Review Dg Chest Port 1 View  10/15/2014   CLINICAL DATA:  CHF  EXAM: PORTABLE CHEST - 1 VIEW   COMPARISON:  10/12/2014  FINDINGS: Cardiomegaly with mild perihilar edema, improved. Left lung base is obscured, likely a combination of atelectasis and small pleural effusion.  IMPRESSION: Cardiomegaly with mild perihilar edema, improved.  Suspected small left pleural effusion.   Electronically Signed   By: Julian Hy M.D.   On: 10/15/2014 08:53     EKG Interpretation   Date/Time:  Tuesday October 12 2014 06:01:10 EDT Ventricular Rate:  67 PR Interval:  225 QRS Duration: 166 QT Interval:  531 QTC Calculation: 561 R Axis:   -38 Text Interpretation:  Sinus rhythm Prolonged PR interval Consider left  atrial enlargement Left bundle branch block Baseline wander in lead(s) V2  ED PHYSICIAN INTERPRETATION AVAILABLE IN [facility] Confirmed by  TEST, Record ([id number]) on 10/13/2014 8:05:00 AM  MDM   Final diagnoses:  CHF exacerbation  Breast cancer, left  Diabetes mellitus without complication  Essential hypertension  Arthritis  AKI (acute kidney injury)   I personally performed the services described in this documentation, which was scribed in my presence. The recorded information has been reviewed and is accurate.  Patient with evidence of pulmonary edema on chest x-ray. Sats improve on nonrebreather. Patient appears comfortable. Discussed with [facility] and will admit.  Julianne Rice, MD 10/16/14 [phone]

## 2014-10-12 NOTE — ED Notes (Signed)
Per Vicente Males, RN, per EMS, pt found unresponsive, shallow breaths, initially 60% on RA, placed on NRB and 02 sats reading 96%. EMS reports pt in A-fib.

## 2014-10-12 NOTE — Evaluation (Signed)
Physical Therapy Evaluation Patient Details Name: Jordan [name] MRN: [id number] DOB: 11/02/1940 Today's Date: 10/12/2014   History of Present Illness  74 y.o. female with PMH of hypertension, diabetes mellitus, GERD, breast cancer, COPD, arthritis, chronic kidney disease (unknown stage), chronic leg edema, who presents with shortness of breath and syncope. Pt visiting from [place] for 3 weeks where she plans to return to care for sister s/p CVA and her spouse is in a SNF in [place].   Clinical Impression  Pt very pleasant and determined to be able to walk and move to return to [place]. Pt states she tends to wear depends and will not get out of the car the entire 5 hr ride to [place], [place]. Pt educated for benefit of emptying her bladder with travel, walking and mobility to prevent DVT and weakness. Pt educated for activity, transfers gait and encouraged to continue to mobilize daily with nursing. Pt with generalized weakness and decreased activity tolerance who will benefit from acute therapy to maximize mobility, function and gait to decrease burden of care and return pt to caring for others.     Follow Up Recommendations Home health PT;Supervision for mobility/OOB    Equipment Recommendations  None recommended by PT    Recommendations for Other Services       Precautions / Restrictions Precautions Precautions: Fall Precaution Comments: watch sats Restrictions Weight Bearing Restrictions: No      Mobility  Bed Mobility Overal bed mobility: Modified Independent             General bed mobility comments: increased time with rail and momentum, pt denied need for assist  Transfers Overall transfer level: Needs assistance   Transfers: Sit to/from Stand Sit to Stand: Min guard         General transfer comment: cues for hand placement with 2 trials, pt not able to push with RUE and standing propping on Right forearm due to pain  Ambulation/Gait Ambulation/Gait assistance:  Min guard;+2 safety/equipment Ambulation Distance (Feet): 20 Feet Assistive device: Rolling walker (2 wheeled) Gait Pattern/deviations: Step-through pattern;Decreased stride length;Trunk flexed   Gait velocity interpretation: Below normal speed for age/gender General Gait Details: cues for posture and position in RW with pt maintaining flexed trunk and propping on LUE  Stairs            Wheelchair Mobility    Modified Rankin (Stroke Patients Only)       Balance Overall balance assessment: Needs assistance   Sitting balance-Leahy Scale: Good       Standing balance-Leahy Scale: Poor                               Pertinent Vitals/Pain Pain Assessment: No/denies pain  HR 88 sats 90-96% on 2L with activity    Home Living Family/patient expects to be discharged to:: Private residence Living Arrangements: Other relatives Available Help at Discharge: Family;Available 24 hours/day Type of Home: House Home Access: Stairs to enter   [employer] of Steps: 1 Home Layout: Two level Home Equipment: Walker - 2 wheels;Walker - 4 wheels;Grab bars - tub/shower;Hand held shower head;Shower seat      Prior Function Level of Independence: Independent with assistive device(s)         Comments: family does the housework and cooking she uses a RW and does her own ADLs     Hand Dominance        Extremity/Trunk Assessment   Upper Extremity  Assessment: Generalized weakness;LUE deficits/detail       LUE Deficits / Details: edema with pain s/p breast CA states she is awaiting compression sleeve   Lower Extremity Assessment: Generalized weakness (bil LE edema with blisters)      Cervical / Trunk Assessment: Kyphotic  Communication   Communication: HOH  Cognition Arousal/Alertness: Awake/alert Behavior During Therapy: WFL for tasks assessed/performed Overall Cognitive Status: Within Functional Limits for tasks assessed                       General Comments      Exercises        Assessment/Plan    PT Assessment Patient needs continued PT services  PT Diagnosis Generalized weakness;Difficulty walking   PT Problem List Decreased strength;Decreased activity tolerance;Decreased balance;Decreased mobility;Cardiopulmonary status limiting activity;Decreased knowledge of use of DME;Obesity  PT Treatment Interventions Gait training;DME instruction;Functional mobility training;Therapeutic activities;Therapeutic exercise;Patient/family education   PT Goals (Current goals can be found in the Care Plan section) Acute Rehab PT Goals Patient Stated Goal: return to [place] PT Goal Formulation: With patient/family Time For Goal Achievement: 10/26/14 Potential to Achieve Goals: Good    Frequency Min 3X/week   Barriers to discharge        Co-evaluation               End of Session Equipment Utilized During Treatment: Oxygen Activity Tolerance: Patient tolerated treatment well Patient left: in chair;with call bell/phone within reach;with family/visitor present Nurse Communication: Mobility status         Time: [id number] PT Time Calculation (min) (ACUTE ONLY): 20 min   Charges:   PT Evaluation $Initial PT Evaluation Tier I: 1 Procedure     PT G CodesMelford Aase 10/12/2014, 9:40 AM Elwyn Reach, [place]

## 2014-10-12 NOTE — Evaluation (Signed)
Occupational Therapy Evaluation Patient Details Name: Jordan [name] MRN: [id number] DOB: 06/19/1940 Today's Date: 10/12/2014    History of Present Illness 74 y.o. female with PMH of hypertension, diabetes mellitus, GERD, breast cancer, COPD, arthritis, chronic kidney disease (unknown stage), chronic leg edema, who presents with shortness of breath and syncope. Pt visiting from [place] for 3 weeks where she plans to return to care for sister s/p CVA and her spouse is in a SNF in [place].    Clinical Impression   Pt was performing self care and mobility at a modified independent level prior to admission.  She uses 02 intermittently at home during exertion.  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  Pt presents with generalized weakness, impaired balance and decreased activity tolerance interfering with ability to perform ADL and mobility at her baseline. Pt tearful this visit, does not want to be a burden to her grandson and is eager to return home to [place].  Will follow acutely.     Follow Up Recommendations  Home health OT    Equipment Recommendations       Recommendations for Other Services       Precautions / Restrictions Precautions Precautions: Fall Precaution Comments: watch sats Restrictions Weight Bearing Restrictions: No      Mobility Bed Mobility               General bed mobility comments: pt up in chair, returned to chair  Transfers Overall transfer level: Needs assistance Equipment used: Rolling walker (2 wheeled) Transfers: Sit to/from Stand;Stand Pivot Transfers Sit to Stand: Min guard Stand pivot transfers: Min guard       General transfer comment: used momentum and UE assist for sit to stand    Balance     Sitting balance-Leahy Scale: Good       Standing balance-Leahy Scale: Poor                              ADL Overall ADL's : Needs assistance/impaired Eating/Feeding: Independent;Sitting   Grooming:  Wash/dry hands;Min guard;Standing   Upper Body Bathing: Set up;Sitting   Lower Body Bathing: Moderate assistance;Sit to/from stand   Upper Body Dressing : Set up;Sitting   Lower Body Dressing: Moderate assistance;Sit to/from stand   Toilet Transfer: Min guard;Stand-pivot;BSC;RW   Toileting- Water quality scientist and Hygiene: Minimal assistance;Sit to/from stand         General ADL Comments: Pt uses a bath brush for washing feet and back at home.  She only wears stockings on Sundays, otherwise wears only slip on shoes.     Vision     Perception     Praxis      Pertinent Vitals/Pain Pain Assessment: No/denies pain     Hand Dominance Right   Extremity/Trunk Assessment Upper Extremity Assessment Upper Extremity Assessment: RUE deficits/detail;LUE deficits/detail RUE Deficits / Details: generalized weakness LUE Deficits / Details: edema with pain s/p breast CA states she is awaiting compression sleeve   Lower Extremity Assessment Lower Extremity Assessment: Defer to PT evaluation   Cervical / Trunk Assessment Cervical / Trunk Assessment: Kyphotic   Communication Communication Communication: HOH   Cognition Arousal/Alertness: Awake/alert Behavior During Therapy: WFL for tasks assessed/performed (tearful at times) Overall Cognitive Status: Within Functional Limits for tasks assessed                     General Comments  Exercises       Shoulder Instructions      Home Living Family/patient expects to be discharged to:: Private residence Living Arrangements: Other relatives (currently staying with her grandson, his wife and their kids) Available Help at Discharge: Family;Available 24 hours/day Type of Home: House Home Access: Stairs to enter [employer] of Steps: 1   Home Layout: Two level     Bathroom Shower/Tub: Teacher, early years/pre: Standard     Home Equipment: Environmental consultant - 2 wheels;Walker - 4 wheels;Grab bars -  tub/shower;Hand held shower head;Shower seat;Other (comment) (02, CPAP)          Prior Functioning/Environment Level of Independence: Independent with assistive device(s)        Comments: family does the housework and cooking she uses a RW and does her own ADLs, at home pt has assist with housekeeping only 1 x per month    OT Diagnosis: Generalized weakness   OT Problem List: Decreased strength;Decreased activity tolerance;Impaired balance (sitting and/or standing);Decreased knowledge of use of DME or AE;Cardiopulmonary status limiting activity;Obesity   OT Treatment/Interventions: Self-care/ADL training;DME and/or AE instruction;Patient/family education;Balance training;Energy conservation    OT Goals(Current goals can be found in the care plan section) Acute Rehab OT Goals Patient Stated Goal: return to [place] OT Goal Formulation: With patient Time For Goal Achievement: 10/26/14 Potential to Achieve Goals: Good ADL Goals Pt Will Perform Grooming: with supervision;standing Pt Will Perform Lower Body Bathing: with supervision;sit to/from stand Pt Will Perform Lower Body Dressing: with supervision;sit to/from stand Pt Will Transfer to Toilet: with supervision;ambulating;regular height toilet Pt Will Perform Toileting - Clothing Manipulation and hygiene: with supervision;sit to/from stand Pt Will Perform Tub/Shower Transfer: Tub transfer;with supervision;ambulating;shower seat;rolling walker Additional ADL Goal #1: Pt will generalize energy conservation strategies in ADL and mobility with minimal verbal cues. Additional ADL Goal #2: Pt will be knowledgeable in use/availability of AE for LB dressing.  OT Frequency: Min 2X/week   Barriers to D/C:            Co-evaluation              End of Session Equipment Utilized During Treatment: Rolling walker;Oxygen  Activity Tolerance: Patient limited by fatigue Patient left: in chair;with call bell/phone within reach;with  nursing/sitter in room;with family/visitor present   Time: 1350-1411 OT Time Calculation (min): 21 min Charges:  OT General Charges $OT Visit: 1 Procedure OT Evaluation $Initial OT Evaluation Tier I: 1 Procedure G-Codes:    Malka So 10/12/2014, 2:43 PM  [phone]

## 2014-10-12 NOTE — Consult Note (Signed)
Patient ID: Jordan [name] MRN: [id number], DOB/AGE: Sep 13, 1940   Admit date: 10/12/2014   Primary Physician: Pcp Not In System Primary Cardiologist: New  Pt. Profile:  74 y/o AAF with h/o HTN, IDDM, CKD and breast CA s/p radiation but no know h/o CAD, admitted after being found unresponsive at home. With elevated troponin and recent h/o CP.  Problem List  Past Medical History  Diagnosis Date  . Breast cancer   . Diabetes mellitus without complication   . Hypertension   . Arthritis   . COPD (chronic obstructive pulmonary disease)   . Renal disorder     Family reports acute renal failure  . GERD (gastroesophageal reflux disease)   . CKD (chronic kidney disease)     Past Surgical History  Procedure Laterality Date  . Breast surgery Left      Allergies  No Known Allergies  HPI  The patient is a 74 y/o AA female, from [place], [place], outside of [place]. She is in [place] visiting her daughter and grandchildren. She has a h/o HTN, DM, COPD, CKD and breast CA s/p chemo and radiation. She presented to the [facility] ED, early this morning just after midnight, via  EMS after being found unresponsive with shallow breaths. She was found by her daughter.   The patient reports a 2 day history of intermittent chest discomfort awakening her from her sleep. Described as substernal chest pressure/ heaviness with associated dyspnea. She tried TUMS at home with little relief. She recalls having recurrent symptoms last night and called for her daughter to get her inhaler and extra pillows. She had no improvement. Shortly after, her daughter noticed that she was unresponsive with shallow breathing. Her granddaughter reportedly tried to give rescue breaths but the patient does not recall this happening. She remembers waking up in the ambulance while being transported to [facility] Per EMS report, O2 sats on arrival to home was 60% on RA. She was placed on a NRB and transported to the ED.   On  arrival, patient was found to have a of BNP of 143, initial troponin was negative, afebrile, slight bradycardia, elevated creatinine at 3.60 (baseline unknown) and BUN at 42. ABG showed pH 7.206, PCO2 64.1, PO2 252. Chest x-ray showed alvola edema. U/A negative. Renal ultrasound negative for hydronephrosis. She was admitted by Internal Medicine. 2D echo is pending and her 2nd and 3rd troponins both returned elevated at 0.51 and 0.58. EKG shows SR with LBBB (no prior EKGs to compare to). She is currently out of bed and in a chair. She is asymptomatic.    Home Medications  Prior to Admission medications   Medication Sig Start Date End Date Taking? Authorizing Provider  bumetanide (BUMEX) 2 MG tablet Take 2 mg by mouth 2 (two) times daily.   Yes Historical Provider, MD  capsaicin (ZOSTRIX) 0.025 % cream Apply 1 application topically 2 (two) times daily as needed (pain).   Yes Historical Provider, MD  carvedilol (COREG) 6.25 MG tablet Take 6.25 mg by mouth 2 (two) times daily with a meal.   Yes Historical Provider, MD  cloNIDine (CATAPRES) 0.2 MG tablet Take 0.2 mg by mouth 3 (three) times daily.   Yes Historical Provider, MD  Dextromethorphan Polistirex (DELSYM PO) Take 10 mLs by mouth every 6 (six) hours as needed (cough).   Yes Historical Provider, MD  gabapentin (NEURONTIN) 300 MG capsule Take 300 mg by mouth 2 (two) times daily.   Yes Historical Provider, MD  guaiFENesin-codeine [facility]  AC) 100-10 MG/5ML syrup Take 10 mLs by mouth every 6 (six) hours as needed for cough.   Yes Historical Provider, MD  HYDROcodone-acetaminophen (NORCO/VICODIN) 5-325 MG per tablet Take 1 tablet by mouth every 6 (six) hours as needed for moderate pain.   Yes Historical Provider, MD  Insulin Detemir (LEVEMIR FLEXPEN) 100 UNIT/ML Pen Inject 70 Units into the skin every morning.   Yes Historical Provider, MD  insulin lispro (HUMALOG KWIKPEN) 100 UNIT/ML KiwkPen Inject 6-12 Units into the skin 3 (three) times daily as  needed (blood sugar).   Yes Historical Provider, MD  Multiple Vitamins-Minerals (CENTRUM SILVER ADULT 50+ PO) Take 1 tablet by mouth daily.   Yes Historical Provider, MD  omeprazole (PRILOSEC) 40 MG capsule Take 40 mg by mouth daily.   Yes Historical Provider, MD  OVER THE COUNTER MEDICATION Place 2-3 tablets under the tongue every 4 (four) hours as needed (restful legs).   Yes Historical Provider, MD  promethazine (PHENERGAN) 12.5 MG tablet Take 12.5 mg by mouth every 6 (six) hours as needed for nausea or vomiting.   Yes Historical Provider, MD  Tiotropium Bromide-Olodaterol (STIOLTO RESPIMAT) 2.5-2.5 MCG/ACT AERS Inhale 2 puffs into the lungs 2 (two) times daily as needed (shortness of breath).    Yes Historical Provider, MD    Family History  Family History  Problem Relation Age of Onset  . Diabetes Mother   . Hypertension Mother   . Stroke Sister   . Hypertension Sister     Social History  History   Social History  . Marital Status: Single    Spouse Name: [name]  . Number of Children: [name]  . Years of Education: [name]   Occupational History  . Not on file.   Social History Main Topics  . Smoking status: Never Smoker   . Smokeless tobacco: Not on file  . Alcohol Use: No  . Drug Use: Not on file  . Sexual Activity: Not on file   Other Topics Concern  . Not on file   Social History Narrative  . No narrative on file     Review of Systems General:  No chills, fever, night sweats or weight changes.  Cardiovascular:  No chest pain, dyspnea on exertion, edema, orthopnea, palpitations, paroxysmal nocturnal dyspnea. Dermatological: No rash, lesions/masses Respiratory: No cough, dyspnea Urologic: No hematuria, dysuria Abdominal:   No nausea, vomiting, diarrhea, bright red blood per rectum, melena, or hematemesis Neurologic:  No visual changes, wkns, changes in mental status. All other systems reviewed and are otherwise negative except as noted above.  Physical Exam  Blood  pressure 204/87, pulse 71, temperature 98.2 F (36.8 C), temperature source Oral, resp. rate 20, height 5\' 5"  (1.651 m), weight 302 lb (136.986 kg), SpO2 100 %.  General: Pleasant, NAD, obese Psych: Normal affect. Neuro: Alert and oriented X 3. Moves all extremities spontaneously. HEENT: Normal  Neck: Supple without bruits or JVD. Lungs:  Resp regular and unlabored, CTA. Heart: RRR no s3, s4, or murmurs. Abdomen: Soft, non-tender, non-distended, BS + x 4.  Extremities: No clubbing, cyanosis or edema. DP/PT/Radials 2+ and equal bilaterally.  Labs  Troponin [facility] of Care Test)  Recent Labs  10/12/14 0137  TROPIPOC 0.03    Recent Labs  10/12/14 0745 10/12/14 1053  TROPONINI 0.51* 0.58*   Lab Results  Component Value Date   WBC 8.0 10/12/2014   HGB 11.1* 10/12/2014   HCT 36.0 10/12/2014   MCV 83.1 10/12/2014   PLT 278 10/12/2014  Recent Labs Lab 10/12/14 0100  NA 137  K 4.3  CL 103  CO2 20*  BUN 42*  CREATININE 3.60*  CALCIUM 7.2*  PROT 6.9  BILITOT 0.2*  ALKPHOS 101  ALT 37  AST 59*  GLUCOSE 352*   Lab Results  Component Value Date   CHOL 199 10/12/2014   HDL 53 10/12/2014   LDLCALC 123* 10/12/2014   TRIG 113 10/12/2014   No results found for: DDIMER   Radiology/Studies  US Renal  10/12/2014   CLINICAL DATA:  Acute renal insufficiency  EXAM: RENAL / URINARY TRACT ULTRASOUND COMPLETE  COMPARISON:  None.  FINDINGS: Right Kidney:  Length: 11.7 cm.  Mildly increased echogenicity.  No hydronephrosis.  Left Kidney:  Length: 12.1 cm.  Mildly increased echogenicity.  No hydronephrosis.  Bladder:  Appears normal for degree of bladder distention.  IMPRESSION: Negative for hydronephrosis. There is mildly increased renal parenchymal echogenicity consistent with medical renal disease.   Electronically Signed   By: Andreas Newport M.D.   On: 10/12/2014 06:29   Dg Chest Portable 1 View  10/12/2014   CLINICAL DATA:  Respiratory distress  EXAM: PORTABLE CHEST - 1  VIEW  COMPARISON:  None.  FINDINGS: Central and basilar airspace opacities are present bilaterally. There is cardiomegaly. There probably is a small left pleural effusion.  IMPRESSION: Extensive airspace opacities and central and basilar distribution. This may represent alveolar edema associated with congestive heart failure. Probable left effusion. Cannot exclude infectious infiltrates.   Electronically Signed   By: Andreas Newport M.D.   On: 10/12/2014 01:51    ECG  SR with LBBB (nor prior EKGs to compare to).   Echocardiogram - pending   ASSESSMENT AND PLAN  Principal Problem:   SOB (shortness of breath) Active Problems:   CHF exacerbation   Breast cancer   Diabetes mellitus without complication   Hypertension   Arthritis   COPD (chronic obstructive pulmonary disease)   Syncope   CKD (chronic kidney disease)   Acute on chronic respiratory failure   1. Elevated Troponin: in the setting of recent 2 day history of intermittent SSCP and dyspnea awakening her from her sleep. Trend 0.51-->0.58. This could potentially be secondary to demand ischemia, however she has multiple cardiac risk factors including IDDM, HTN, HLD (LDL at 123), obesity and prior radiation exposure to chest for breast CA treatments. Her EKG also shows a LBBB. She may potentially have CAD. Would recommend ischemic testing. However, with abnormal renal function with SCr > 3, would not pursue cardiac catheterization initially. Would recommend initial screening with NST to r/o ischemia. Continue medical therapy with ASA and BB. Hold initiation of statin due to elevated AST. Keep BP controlled. Agree with 2D echo.  2. HTN: elevated in the [id number] systolic. Clonidine just recently given. Continue Coreg. Monitor for bradycardia given clonidine and BB. Not a candidate for ACE/ARB given CKD. If 2D echo shows LV dysfunction, would recommend addition of hydralazine + nitrate.  3. HLD: LDL elevated at 123. CMP shows elevated AST at  59. Hold statin therapy.   4. DM: management per IM.   5. Acute CHF: mildly elevated BNP at 143. IV lasix ordered by primary team. 2D echo pending.     Signed, Lyda Jester, PA-C 10/12/2014, 2:46 PM Patient seen and examined and history reviewed. Agree with above findings and plan. Very pleasant 74 yo BF from [place] admitted with dyspnea, chest pain, and hypoxia. She has a history of DM, HL, and HTN. No  known prior cardiac history. Apparent history of OSA and was on CPAP for some time but later discontinued by physician. She reports a nuclear stress test was done 2014 prior to breast surgery and was OK. No prior cardiac cath. Says prior Ecgs were "normal". She has CKD with unknown prior creatinine.  On exam she is obese. Lungs are clear. CV exam without murmur, rub, or gallop. She has 2+ edema with bullous lesions on legs. Ecg shows a LBBB. troponins are slightly elevated. She has a high risk for CAD. Ordinarily I would recommend cardiac cath to define her anatomy and risk but given CKD stage 4-5 we need to proceed cautiously. Echo is pending. Need to procure records from [place]. Will schedule for a Lexiscan myoview study in am. If her stress test is abnormal may need to still consider cardiac cath but there would be a high risk of contrast induced nephropathy and Renal would need to be consulted.   Peter Martinique, [place] 10/12/2014 4:16 PM

## 2014-10-12 NOTE — Care Management Note (Signed)
Case Management Note  Patient Details  Name: Jordan [name] MRN: [id number] Date of Birth: [birth year]  Subjective/Objective:          Adm w shortness of breath         Action/Plan: visiting from [place]   Expected Discharge Date:                  Expected Discharge Plan:     In-House Referral:     Discharge planning Services     Post Acute Care Choice:    Choice offered to:     DME Arranged:    DME Agency:     HH Arranged:    [place] Agency:     Status of Service:     Medicare Important Message Given:    Date Medicare IM Given:    Medicare IM give by:    Date Additional Medicare IM Given:    Additional Medicare Important Message give by:     If discussed at [place] of Stay Meetings, dates discussed:    Additional Comments: ur review done  Lacretia Leigh, RN 10/12/2014, 8:37 AM

## 2014-10-12 NOTE — Progress Notes (Addendum)
Patient admitted after midnight, please see H&P.  Patient is visiting [place] from [place]  SOB (shortness of breath) and acute on chronic respiratory failure:  ?CHF exacerbation vs COPD -will admit to sdu  -prn BiPAP -treat CHF exacerbation and COPD as below  Possible CHF exacerbation: no 2d echo data available. - IV lasix 40 mg bid -continue coreg -will cycle CE X3 -will get 2-D echo to evaluate EF -weight, i/os -No on ACEI because of CKD  COPD: Patient has very mild wheezing on lung auscultation, but no chest pain or cough. Does not seem to have acute exacerbation. -Nebulizers: scheduled Duoneb and prn albuterol -Solu-Medrol 60 mg IV daily  -Robitussin for cough   Syncope: Etiology is not clear. Patient mental status is normal now. No signs of stroke. No signs of infection. Potential differential diagnosis is pulmonary embolism given shortness of breath, but patient does not have any chest pain. Her shortness of breath can be explained by CHF and COPD. -neuro check q4h  DM-II: No A1c on record. Patient is taking levimir at home -will continue levimir 70 units qAM -SSI -Check A1c  Hx of Breast cancer: s/p of L mastectomy, chemotherapy and radiation therapy. Patient has been followed up with oncologist. Last seen was 2 months ago, was told that her breast cancer was stable. -Follow-up with her oncologist.  Hypertension: -on lasix, Coreg, clonidine,  GERD: -Protonix  CKD (chronic kidney disease): stage unknown. Creatinine 3.60, BUN 42. -follow up renal Fx by BMP - US-renal- medical renal disease Urine lytes  Increased troponin -? CHF ex with kidney failure -start heparin gtt and consult cards -on ASA -echo pending  Will request records from PCP in [place] Will try off Bipap= wears 3L O2 at home PRN (mainly with exertion)  Eulogio Bear DO

## 2014-10-12 NOTE — Progress Notes (Signed)
[place] for heparin Indication: chest pain/ACS  No Known Allergies  Patient Measurements: Height: 5\' 5"  (165.1 cm) Weight: (!) 302 lb (136.986 kg) IBW/kg (Calculated) : 57 Heparin Dosing Weight: 89kg  Vital Signs: Temp: 98.1 F (36.7 C) (06/28 1957) Temp Source: Oral (06/28 1957) BP: 157/58 mmHg (06/28 1900) Pulse Rate: 57 (06/28 1900)  Labs:  Recent Labs  10/12/14 0100 10/12/14 0745 10/12/14 1053 10/12/14 1630 10/12/14 2246  HGB 11.1*  --   --   --   --   HCT 36.0  --   --   --   --   PLT 278  --   --   --   --   APTT  --  33  --   --   --   LABPROT  --  14.0  --   --   --   INR  --  1.06  --   --   --   HEPARINUNFRC  --   --   --   --  0.15*  CREATININE 3.60*  --   --   --   --   TROPONINI  --  0.51* 0.58* 0.60*  --     Estimated Creatinine Clearance: 20.1 mL/min (by C-G formula based on Cr of 3.6).  Assessment: 74 yo female with here with SOB/elevated cardiac markers, possible ACS, for heparin   Goal of Therapy:  Heparin level 0.3-0.7 units/ml Monitor platelets by anticoagulation protocol: Yes   Plan:  Heparin 2000 units IV bolus, then increase heparin 1450 units/hr Follow-up am labs.   Phillis Knack, PharmD, BCPS   10/12/2014 11:21 PM

## 2014-10-12 NOTE — H&P (Signed)
[facility] History and Physical  Jordan [name] [id number] DOB: 1940/04/18 DOA: 10/12/2014  Referring physician: ED physician PCP: Pcp Not In System  Specialists:   Chief Complaint: SOB and Syncope  HPI: Jordan [name] is a 74 y.o. female with PMH of hypertension, diabetes mellitus, GERD, breast cancer, COPD, arthritis, chronic kidney disease (unknown stage), chronic leg edema, who presents with shortness of breath and syncope.  Patient comes here from [place] for visiting her daughter. She has been doing fine until 11:30 yesterday when she started having SOB. She does not have cough and chest pain. Per her daughter, and patient moved around in her bed and did some exertion, then she passed out for few seconds. She did not have seizure. No head injury. Patient reports that she is taking Bumex for chronic leg edema, but denies congestive heart failure. She states that her leg swelling has been worsening recently. Patient does not have abdominal pain, diarrhea. No symptoms for UTI. No unilateral weakness numbness or tenderness physician.  In ED, patient was found to have BNP 143, troponin negative, temperature normal, slight bradycardia, creatinine 3.60, BUN 42. ABG showed pH 7.206, PCO2 64.1, PO2 252. Chest x-ray showed alvola edema. Patient is admitted to inpatient for further evaluation and treatment.   Where does patient live?   At home   Can patient participate in ADLs?  Little   Review of Systems:   General: no fevers, chills, has poor appetite, has fatigue HEENT: no blurry vision, hearing changes or sore throat Pulm: has dyspnea, no coughing, has mild wheezing CV: no chest pain, palpitations Abd: no nausea, vomiting, abdominal pain, diarrhea, constipation GU: no dysuria, burning on urination, increased urinary frequency, hematuria  Ext: has leg edema Neuro: no unilateral weakness, numbness, or tingling, no vision change or hearing loss.  Skin: no  rash MSK: No muscle spasm, no deformity, no limitation of range of movement in spin Heme: No easy bruising.  Travel history: No recent long distant travel.  Allergy: No Known Allergies  Past Medical History  Diagnosis Date  . Breast cancer   . Diabetes mellitus without complication   . Hypertension   . Arthritis   . COPD (chronic obstructive pulmonary disease)   . Renal disorder     Family reports acute renal failure  . GERD (gastroesophageal reflux disease)   . CKD (chronic kidney disease)     Past Surgical History  Procedure Laterality Date  . Breast surgery Left     Social History:  reports that she has never smoked. She does not have any smokeless tobacco history on file. She reports that she does not drink alcohol. Her drug history is not on file.  Family History:  Family History  Problem Relation Age of Onset  . Diabetes Mother   . Hypertension Mother   . Stroke Sister   . Hypertension Sister      Prior to Admission medications   Not on File    Physical Exam: Filed Vitals:   10/12/14 0445 10/12/14 0500 10/12/14 0515 10/12/14 0545  BP: 173/79 158/79 166/77 168/88  Pulse: 64 63 62 63  Temp:      TempSrc:      Resp: 14  13 18   Height:      Weight:      SpO2: 100% 99% 100% 100%   General: Not in acute distress HEENT:       Eyes: PERRL, EOMI, no scleral icterus.       ENT: No discharge  from the ears and nose, no pharynx injection, no tonsillar enlargement.        Neck: difficult to assess JVD due to morbid obesity.  no bruit, no mass felt. Heme: No neck lymph node enlargement. Cardiac: S1/S2, RRR, No murmurs, No gallops or rubs. Pulm: has mild wheezing, No rale or rubs. Abd: Soft, nondistended, nontender, no rebound pain, no organomegaly, BS present. Ext: 2 + pitting leg edema bilaterally. 2+DP/PT pulse bilaterally. Musculoskeletal: No joint deformities, No joint redness or warmth, no limitation of ROM in spin. Skin: No rashes.  Neuro: Alert, oriented  X3, cranial nerves II-XII grossly intact, muscle strength 5/5 in all extremities, sensation to light touch intact. Brachial reflex 1+ bilaterally. Knee reflex 1+ bilaterally. Negative Babinski's sign. Normal finger to nose test. Psych: Patient is not psychotic, no suicidal or hemocidal ideation.  Labs on Admission:  Basic Metabolic Panel:  Recent Labs Lab 10/12/14 0100  NA 137  K 4.3  CL 103  CO2 20*  GLUCOSE 352*  BUN 42*  CREATININE 3.60*  CALCIUM 7.2*   Liver Function Tests:  Recent Labs Lab 10/12/14 0100  AST 59*  ALT 37  ALKPHOS 101  BILITOT 0.2*  PROT 6.9  ALBUMIN 3.1*   No results for input(s): LIPASE, AMYLASE in the last 168 hours. No results for input(s): AMMONIA in the last 168 hours. CBC:  Recent Labs Lab 10/12/14 0100  WBC 8.0  NEUTROABS 4.9  HGB 11.1*  HCT 36.0  MCV 83.1  PLT 278   Cardiac Enzymes: No results for input(s): CKTOTAL, CKMB, CKMBINDEX, TROPONINI in the last 168 hours.  BNP (last 3 results)  Recent Labs  10/12/14 0100  BNP 143.8*    ProBNP (last 3 results) No results for input(s): PROBNP in the last 8760 hours.  CBG: No results for input(s): GLUCAP in the last 168 hours.  Radiological Exams on Admission: Dg Chest Portable 1 View  10/12/2014   CLINICAL DATA:  Respiratory distress  EXAM: PORTABLE CHEST - 1 VIEW  COMPARISON:  None.  FINDINGS: Central and basilar airspace opacities are present bilaterally. There is cardiomegaly. There probably is a small left pleural effusion.  IMPRESSION: Extensive airspace opacities and central and basilar distribution. This may represent alveolar edema associated with congestive heart failure. Probable left effusion. Cannot exclude infectious infiltrates.   Electronically Signed   By: Andreas Newport M.D.   On: 10/12/2014 01:51    EKG: Independently reviewed.  Abnormal findings: QTC 548, sinus arrhythmia, widened QRS, LAD,  Assessment/Plan Principal Problem:   SOB (shortness of  breath) Active Problems:   CHF exacerbation   Breast cancer   Diabetes mellitus without complication   Hypertension   Arthritis   COPD (chronic obstructive pulmonary disease)   Syncope   CKD (chronic kidney disease)   Acute on chronic respiratory failure  SOB (shortness of breath) and acute on chronic respiratory failure: Patient's shortness of breath and acute respiratory failure are most likely caused by CHF exacerbation. Patient denies history of CHF, but she is taking Bumex for chronic leg edema. It is likely that the patient has undiagnosed congestive heart failure, alternatively she may not know that she has congestive heart failure although she is being treated for CHF by her doctor. Patient's shortness of breath, chest x-ray finding, leg edema are consistent with congestive heart failure. Her comparatively low BNP is likely due to obesity. Patient has COPD, which may have also contributed partially.  -will admit to sdu  -prn BiPAP -treat CHF exacerbation  and COPD as below  Possible CHF exacerbation: no 2d echo data available. -will treat with IV lasix 40 mg bid -start  -continue coreg -will cycle CE X3 -will get 2-D echo to evaluate EF -will get EKG -strict In/Out -Daily body weight. -heart diet -No on ACEI because of CKD  COPD: Patient has very mild wheezing on lung auscultation, but no chest pain or cough. Does not seem to have acute exacerbation. -Nebulizers: scheduled Duoneb and prn albuterol -Solu-Medrol 60 mg IV daily  -Robitussin for cough   Syncope: Etiology is not clear. Patient mental status is normal now. No signs of stroke. No signs of infection. Potential differential diagnosis is pulmonary embolism given shortness of breath, but patient does not have any chest pain. Her shortness of breath can be explained by CHF and COPD. -neuro check q4h  DM-II: No A1c on record. Patient is taking levimir at home -will continue levimir 70 units qAM -SSI -Check  A1c  Hx of Breast cancer: s/p of L mastectomy, chemotherapy and radiation therapy. Patient has been followed up with oncologist. Last seen was 2 months ago, was told that her breast cancer was stable. -Follow-up with her oncologist.  Hypertension: -on lasix, Coreg, clonidine,  GERD: -Protonix  CKD (chronic kidney disease): stage unknown. Creatinine 3.60, BUN 42. -follow up renal Fx by BMP -check FeUrea and US-renal   DVT ppx: SQ Heparin   Code Status: Full code Family Communication:  Yes, patient's family      at bed side Disposition Plan: Admit to inpatient   Date of Service 10/12/2014    Ivor Costa [facility] Pager [phone]  If 7PM-7AM, please contact night-coverage [email or web address] Password [facility] 10/12/2014, 6:08 AM

## 2014-10-12 NOTE — ED Notes (Signed)
Ultrasound at bedside

## 2014-10-12 NOTE — Progress Notes (Signed)
Patient taken off of Bipap and placed on 4L nasal cannula.  Currently tolerating well.  Will continue to monitor.

## 2014-10-12 NOTE — Progress Notes (Signed)
Critical ABG value given to MD.

## 2014-10-12 NOTE — Progress Notes (Signed)
ANTICOAGULATION CONSULT NOTE - Initial Consult  Pharmacy Consult for heparin Indication: chest pain/ACS  No Known Allergies  Patient Measurements: Height: 5\' 5"  (165.1 cm) Weight: (!) 302 lb (136.986 kg) IBW/kg (Calculated) : 57 Heparin Dosing Weight: 89kg  Vital Signs: Temp: 98.2 F (36.8 C) (06/28 1218) Temp Source: Oral (06/28 1218) BP: 179/87 mmHg (06/28 1218) Pulse Rate: 67 (06/28 1218)  Labs:  Recent Labs  10/12/14 0100 10/12/14 0745 10/12/14 1053  HGB 11.1*  --   --   HCT 36.0  --   --   PLT 278  --   --   APTT  --  33  --   LABPROT  --  14.0  --   INR  --  1.06  --   CREATININE 3.60*  --   --   TROPONINI  --  0.51* 0.58*    Estimated Creatinine Clearance: 20.1 mL/min (by C-G formula based on Cr of 3.6).   Medical History: Past Medical History  Diagnosis Date  . Breast cancer   . Diabetes mellitus without complication   . Hypertension   . Arthritis   . COPD (chronic obstructive pulmonary disease)   . Renal disorder     Family reports acute renal failure  . GERD (gastroesophageal reflux disease)   . CKD (chronic kidney disease)     Medications:  Prescriptions prior to admission  Medication Sig Dispense Refill Last Dose  . bumetanide (BUMEX) 2 MG tablet Take 2 mg by mouth 2 (two) times daily.   10/11/2014 at Unknown time  . capsaicin (ZOSTRIX) 0.025 % cream Apply 1 application topically 2 (two) times daily as needed (pain).   unk  . carvedilol (COREG) 6.25 MG tablet Take 6.25 mg by mouth 2 (two) times daily with a meal.   10/11/2014 at unk  . cloNIDine (CATAPRES) 0.2 MG tablet Take 0.2 mg by mouth 3 (three) times daily.   10/11/2014 at Unknown time  . Dextromethorphan Polistirex (DELSYM PO) Take 10 mLs by mouth every 6 (six) hours as needed (cough).   unk  . gabapentin (NEURONTIN) 300 MG capsule Take 300 mg by mouth 2 (two) times daily.   10/11/2014 at Unknown time  . guaiFENesin-codeine (ROBITUSSIN AC) 100-10 MG/5ML syrup Take 10 mLs by mouth every 6  (six) hours as needed for cough.   unk  . HYDROcodone-acetaminophen (NORCO/VICODIN) 5-325 MG per tablet Take 1 tablet by mouth every 6 (six) hours as needed for moderate pain.   unk  . Insulin Detemir (LEVEMIR FLEXPEN) 100 UNIT/ML Pen Inject 70 Units into the skin every morning.   10/11/2014 at Unknown time  . insulin lispro (HUMALOG KWIKPEN) 100 UNIT/ML KiwkPen Inject 6-12 Units into the skin 3 (three) times daily as needed (blood sugar).   10/11/2014 at Unknown time  . Multiple Vitamins-Minerals (CENTRUM SILVER ADULT 50+ PO) Take 1 tablet by mouth daily.   10/11/2014 at Unknown time  . omeprazole (PRILOSEC) 40 MG capsule Take 40 mg by mouth daily.   10/11/2014 at Unknown time  . OVER THE COUNTER MEDICATION Place 2-3 tablets under the tongue every 4 (four) hours as needed (restful legs).   unk  . promethazine (PHENERGAN) 12.5 MG tablet Take 12.5 mg by mouth every 6 (six) hours as needed for nausea or vomiting.   unk  . Tiotropium Bromide-Olodaterol (STIOLTO RESPIMAT) 2.5-2.5 MCG/ACT AERS Inhale 2 puffs into the lungs 2 (two) times daily as needed (shortness of breath).    10/11/2014 at Unknown time   Scheduled:  .  antiseptic oral rinse  7 mL Mouth Rinse q12n4p  . aspirin EC  81 mg Oral Daily  . carvedilol  6.25 mg Oral BID WC  . chlorhexidine  15 mL Mouth Rinse BID  . cloNIDine  0.2 mg Oral TID  . furosemide  40 mg Intravenous Q12H  . gabapentin  300 mg Oral BID  . insulin aspart  0-15 Units Subcutaneous TID WC  . insulin aspart  0-5 Units Subcutaneous QHS  . [START ON 10/13/2014] insulin detemir  35 Units Subcutaneous Daily  . ipratropium-albuterol  3 mL Nebulization TID  . methylPREDNISolone (SOLU-MEDROL) injection  60 mg Intravenous Daily  . multivitamin with minerals  1 tablet Oral Daily  . pantoprazole  40 mg Oral Daily  . sodium chloride  3 mL Intravenous Q12H    Assessment: 74 yo female with here with SOB and syncope and possible CHF now noted with increased troponin. Pharmacy has been  consulted to dose heparin for ACS. Heparin sq 5000 units was given at about 6am today.   Goal of Therapy:  Heparin level 0.3-0.7 units/ml Monitor platelets by anticoagulation protocol: Yes   Plan:  -Heparin bolus 2000 units IV followed by 1100 units/hr (~ 13 units/kg/hr) -Heparin level in 8 hours and daily wth CBC daily  Hildred Laser, Pharm D 10/12/2014 1:37 PM

## 2014-10-12 NOTE — ED Notes (Signed)
Admitting MD at bedside.

## 2014-10-12 NOTE — Progress Notes (Signed)
Troponin-0.51, MD aware. Huddle done for foley insertion as ordered by MD.

## 2014-10-12 NOTE — Progress Notes (Signed)
  Echocardiogram 2D Echocardiogram has been performed.  Darlina Sicilian M 10/12/2014, 12:49 PM

## 2014-10-13 ENCOUNTER — Inpatient Hospital Stay (HOSPITAL_COMMUNITY): Payer: Medicare (Managed Care)

## 2014-10-13 DIAGNOSIS — I5021 Acute systolic (congestive) heart failure: Secondary | ICD-10-CM

## 2014-10-13 DIAGNOSIS — N189 Chronic kidney disease, unspecified: Secondary | ICD-10-CM

## 2014-10-13 DIAGNOSIS — I214 Non-ST elevation (NSTEMI) myocardial infarction: Principal | ICD-10-CM

## 2014-10-13 DIAGNOSIS — I272 Pulmonary hypertension, unspecified: Secondary | ICD-10-CM | POA: Diagnosis present

## 2014-10-13 LAB — BASIC METABOLIC PANEL
Anion gap: 9 (ref 5–15)
BUN: 53 mg/dL — AB (ref 6–20)
CALCIUM: 7.4 mg/dL — AB (ref 8.9–10.3)
CHLORIDE: 99 mmol/L — AB (ref 101–111)
CO2: 28 mmol/L (ref 22–32)
CREATININE: 3.9 mg/dL — AB (ref 0.44–1.00)
GFR calc non Af Amer: 11 mL/min — ABNORMAL LOW (ref >60)
GFR, EST AFRICAN AMERICAN: 12 mL/min — AB (ref >60)
Glucose, Bld: 300 mg/dL — ABNORMAL HIGH (ref 65–99)
POTASSIUM: 4.8 mmol/L (ref 3.5–5.1)
SODIUM: 136 mmol/L (ref 135–145)

## 2014-10-13 LAB — CBC
HCT: 32.9 % — ABNORMAL LOW (ref 36.0–46.0)
HEMOGLOBIN: 10.2 g/dL — AB (ref 12.0–15.0)
MCH: 25.8 pg — ABNORMAL LOW (ref 26.0–34.0)
MCHC: 31 g/dL (ref 30.0–36.0)
MCV: 83.3 fL (ref 78.0–100.0)
Platelets: 220 10*3/uL (ref 150–400)
RBC: 3.95 MIL/uL (ref 3.87–5.11)
RDW: 13.6 % (ref 11.5–15.5)
WBC: 14.5 10*3/uL — AB (ref 4.0–10.5)

## 2014-10-13 LAB — GLUCOSE, CAPILLARY
GLUCOSE-CAPILLARY: 328 mg/dL — AB (ref 65–99)
GLUCOSE-CAPILLARY: 296 mg/dL — AB (ref 65–99)
GLUCOSE-CAPILLARY: 319 mg/dL — AB (ref 65–99)
Glucose-Capillary: 383 mg/dL — ABNORMAL HIGH (ref 65–99)

## 2014-10-13 LAB — HEMOGLOBIN A1C
HEMOGLOBIN A1C: 9.4 % — AB (ref 4.8–5.6)
MEAN PLASMA GLUCOSE: 223 mg/dL

## 2014-10-13 LAB — HEPARIN LEVEL (UNFRACTIONATED)
Heparin Unfractionated: 0.59 IU/mL (ref 0.30–0.70)
Heparin Unfractionated: 0.68 IU/mL (ref 0.30–0.70)

## 2014-10-13 MED ORDER — PREDNISONE 20 MG PO TABS
40.0000 mg | ORAL_TABLET | Freq: Every day | ORAL | Status: AC
  Administered 2014-10-14: 40 mg via ORAL
  Filled 2014-10-13 (×2): qty 2

## 2014-10-13 MED ORDER — SODIUM CHLORIDE 0.9 % IJ SOLN: 3.0000 mL | INTRAMUSCULAR | Status: DC | PRN

## 2014-10-13 MED ORDER — INSULIN DETEMIR 100 UNIT/ML ~~LOC~~ SOLN
40.0000 [IU] | Freq: Every day | SUBCUTANEOUS | Status: DC
  Filled 2014-10-13: qty 0.4

## 2014-10-13 MED ORDER — HYDRALAZINE HCL 25 MG PO TABS
25.0000 mg | ORAL_TABLET | Freq: Three times a day (TID) | ORAL | Status: DC
Start: 2014-10-13 — End: 2014-10-15
  Administered 2014-10-13 – 2014-10-15 (×6): 25 mg via ORAL
  Filled 2014-10-13 (×9): qty 1

## 2014-10-13 MED ORDER — ISOSORBIDE MONONITRATE ER 30 MG PO TB24
30.0000 mg | ORAL_TABLET | Freq: Every day | ORAL | Status: DC
  Administered 2014-10-13 – 2014-10-14 (×2): 30 mg via ORAL
  Filled 2014-10-13 (×3): qty 1

## 2014-10-13 MED ORDER — SODIUM CHLORIDE 0.9 % WEIGHT BASED INFUSION
1.0000 mL/kg/h | INTRAVENOUS | Status: DC
  Administered 2014-10-13 – 2014-10-14 (×2): 1 mL/kg/h via INTRAVENOUS

## 2014-10-13 MED ORDER — SODIUM CHLORIDE 0.9 % IV SOLN: 250.0000 mL | INTRAVENOUS | Status: DC | PRN

## 2014-10-13 MED ORDER — CLONIDINE HCL 0.1 MG PO TABS
0.1000 mg | ORAL_TABLET | Freq: Three times a day (TID) | ORAL | Status: DC
  Administered 2014-10-13 – 2014-10-14 (×3): 0.1 mg via ORAL
  Filled 2014-10-13 (×6): qty 1

## 2014-10-13 MED ORDER — SODIUM CHLORIDE 0.9 % IJ SOLN
3.0000 mL | Freq: Two times a day (BID) | INTRAMUSCULAR | Status: DC
  Administered 2014-10-13 – 2014-10-19 (×9): 3 mL via INTRAVENOUS

## 2014-10-13 NOTE — Progress Notes (Signed)
PROGRESS NOTE  Jordan [name] [id number] DOB: 1941-02-20 DOA: 10/12/2014 PCP: Pcp Not In System  Assessment/Plan: SOB (shortness of breath) and acute on chronic respiratory failure:  ?CHF exacerbation vs COPD -treat CHF exacerbation and COPD as below  Possible CHF exacerbation: no 2d echo data available. - IV lasix 40 mg bid -continue coreg -will cycle CE X3 -will get 2-D echo to evaluate EF -weight, i/os -No on ACEI because of CKD  COPD: Patient has very mild wheezing on lung auscultation, but no chest pain or cough. Does not seem to have acute exacerbation. -Nebulizers: scheduled Duoneb and prn albuterol -change to prednisone and taper quickly -Robitussin for cough   Syncope: Etiology is not clear. Patient mental status is normal now. No signs of stroke. No signs of infection. Potential differential diagnosis is pulmonary embolism given shortness of breath, but patient does not have any chest pain. Her shortness of breath can be explained by CHF and COPD.  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 Patient is taking levimir at home -resume levemir at lower dose -SSI -Check A1c  Hx of Breast cancer: s/p of L mastectomy, chemotherapy and radiation therapy. Patient has been followed up with oncologist. Last seen was 2 months ago, was told that her breast cancer was stable. -Follow-up with her oncologist.  Hypertension: -on lasix, Coreg, clonidine,  GERD: -Protonix  CKD (chronic kidney disease): stage unknown. Creatinine 3.60, BUN 42. -renal consult as cath needed - US-renal- medical renal disease -called PCP to get baseline- placed on hold for > 10 minutes. Records have been requested  Increased troponin -start heparin gtt and consult cards -on ASA -plan for cath in AM -echo:  - Left ventricle: The images without contrast are impossible to interpret. Contrast images show akinesis of the inferior septum, akinesis of the inferior wall, hypokinesis of the  anterior septum, and akinesis of the anterior wall. The EF is 30%.  Hip pain -dg hip x ray   Code Status:full Family Communication:  No family at bedside Disposition Plan:    Consultants:  Renal  cardiology  Procedures:      HPI/Subjective: C/o hip pain  Objective: Filed Vitals:   10/13/14 0400  BP: 154/63  Pulse: 57  Temp: 97.8 F (36.6 C)  Resp: 13    Intake/Output Summary (Last 24 hours) at 10/13/14 0818 Last data filed at 10/13/14 0600  Gross per 24 hour  Intake  700.5 ml  Output   1800 ml  Net -1099.5 ml   Filed Weights   10/12/14 0211 10/13/14 0400  Weight: 136.986 kg (302 lb) 141.84 kg (312 lb 11.2 oz)    Exam:   General:  No increased work of breathing, obsese  Cardiovascular: rrr  Respiratory: clear  Abdomen: +Bs, soft  Musculoskeletal: pain on left hip with palpation  Data Reviewed: Basic Metabolic Panel:  Recent Labs Lab 10/12/14 0100  NA 137  K 4.3  CL 103  CO2 20*  GLUCOSE 352*  BUN 42*  CREATININE 3.60*  CALCIUM 7.2*   Liver Function Tests:  Recent Labs Lab 10/12/14 0100  AST 59*  ALT 37  ALKPHOS 101  BILITOT 0.2*  PROT 6.9  ALBUMIN 3.1*   No results for input(s): LIPASE, AMYLASE in the last 168 hours. No results for input(s): AMMONIA in the last 168 hours. CBC:  Recent Labs Lab 10/12/14 0100  WBC 8.0  NEUTROABS 4.9  HGB 11.1*  HCT 36.0  MCV 83.1  PLT 278   Cardiac Enzymes:  Recent Labs Lab  10/12/14 0745 10/12/14 1053 10/12/14 1630  TROPONINI 0.51* 0.58* 0.60*   BNP (last 3 results)  Recent Labs  10/12/14 0100  BNP 143.8*    ProBNP (last 3 results) No results for input(s): PROBNP in the last 8760 hours.  CBG:  Recent Labs Lab 10/12/14 0817 10/12/14 1221 10/12/14 1620 10/12/14 2210  GLUCAP 288* 360* 350* 346*    Recent Results (from the past 240 hour(s))  MRSA PCR Screening     Status: None   Collection Time: 10/12/14  6:42 AM  Result Value Ref Range Status   MRSA  by PCR NEGATIVE NEGATIVE Final    Comment:        The GeneXpert MRSA Assay (FDA approved for NASAL specimens only), is one component of a comprehensive MRSA colonization surveillance program. It is not intended to diagnose MRSA infection nor to guide or monitor treatment for MRSA infections.      Studies: US Renal  10/12/2014   CLINICAL DATA:  Acute renal insufficiency  EXAM: RENAL / URINARY TRACT ULTRASOUND COMPLETE  COMPARISON:  None.  FINDINGS: Right Kidney:  Length: 11.7 cm.  Mildly increased echogenicity.  No hydronephrosis.  Left Kidney:  Length: 12.1 cm.  Mildly increased echogenicity.  No hydronephrosis.  Bladder:  Appears normal for degree of bladder distention.  IMPRESSION: Negative for hydronephrosis. There is mildly increased renal parenchymal echogenicity consistent with medical renal disease.   Electronically Signed   By: Andreas Newport M.D.   On: 10/12/2014 06:29   Dg Chest Portable 1 View  10/12/2014   CLINICAL DATA:  Respiratory distress  EXAM: PORTABLE CHEST - 1 VIEW  COMPARISON:  None.  FINDINGS: Central and basilar airspace opacities are present bilaterally. There is cardiomegaly. There probably is a small left pleural effusion.  IMPRESSION: Extensive airspace opacities and central and basilar distribution. This may represent alveolar edema associated with congestive heart failure. Probable left effusion. Cannot exclude infectious infiltrates.   Electronically Signed   By: Andreas Newport M.D.   On: 10/12/2014 01:51    Scheduled Meds: . antiseptic oral rinse  7 mL Mouth Rinse BID  . aspirin EC  81 mg Oral Daily  . carvedilol  6.25 mg Oral BID WC  . cloNIDine  0.2 mg Oral TID  . furosemide  40 mg Intravenous Q12H  . gabapentin  300 mg Oral BID  . insulin aspart  0-15 Units Subcutaneous TID WC  . insulin aspart  0-5 Units Subcutaneous QHS  . insulin detemir  35 Units Subcutaneous Daily  . ipratropium-albuterol  3 mL Nebulization TID  . methylPREDNISolone  (SOLU-MEDROL) injection  60 mg Intravenous Daily  . multivitamin with minerals  1 tablet Oral Daily  . pantoprazole  40 mg Oral Daily  . sodium chloride  3 mL Intravenous Q12H   Continuous Infusions: . heparin 1,450 Units/hr (10/13/14 0615)   Antibiotics Given (last 72 hours)    None      Principal Problem:   SOB (shortness of breath) Active Problems:   CHF exacerbation   Breast cancer   Diabetes mellitus without complication   Hypertension   Arthritis   COPD (chronic obstructive pulmonary disease)   Syncope   CKD (chronic kidney disease)   Acute on chronic respiratory failure   Chest pain with high risk for cardiac etiology   LBBB (left bundle branch block)   Elevated troponin    Time spent: 35 min    Trashaun Streight  [facility] Pager [phone]. If 7PM-7AM, please contact night-coverage  at [email or web address], password [facility] 10/13/2014, 8:18 AM  LOS: 1 day

## 2014-10-13 NOTE — Consult Note (Signed)
Jordan [name] is a 74 y.o. female with PMH of hypertension, diabetes mellitus, GERD, breast cancer, COPD, arthritis, chronic kidney disease (unknown stage), chronic leg edema, who presented yesterday with shortness of breath and syncope. In ED, patient was found to have BNP 143, troponin negative, temperature normal, slight bradycardia, creatinine 3.60, BUN 42. ABG showed pH 7.206, PCO2 64.1, PO2 252. Chest x-ray showed alvola edema. She is felt high risk for CAD and cardiology isd planning heart catheterization.  Renal is asked to assist.  Of note, pt reports seeing a nephrologist in [place] for several years for CKD.  Admission creat was 3.38m/dl.  Past Medical History  Diagnosis Date  . Breast cancer   . Diabetes mellitus without complication   . Hypertension   . Arthritis   . COPD (chronic obstructive pulmonary disease)   . Renal disorder     Family reports acute renal failure  . GERD (gastroesophageal reflux disease)   . CKD (chronic kidney disease)    Past Surgical History  Procedure Laterality Date  . Breast surgery Left    Social History:  reports that she has never smoked. She does not have any smokeless tobacco history on file. She reports that she does not drink alcohol. Her drug history is not on file. Allergies: No Known Allergies Family History  Problem Relation Age of Onset  . Diabetes Mother   . Hypertension Mother   . Stroke Sister   . Hypertension Sister     Medications:  Scheduled: . antiseptic oral rinse  7 mL Mouth Rinse BID  . aspirin EC  81 mg Oral Daily  . carvedilol  6.25 mg Oral BID WC  . cloNIDine  0.1 mg Oral TID  . furosemide  40 mg Intravenous Q12H  . gabapentin  300 mg Oral BID  . hydrALAZINE  25 mg Oral 3 times per day  . insulin aspart  0-15 Units Subcutaneous TID WC  . insulin aspart  0-5 Units Subcutaneous QHS  . [START ON 10/14/2014] insulin detemir  40 Units Subcutaneous Daily  . ipratropium-albuterol  3 mL Nebulization TID  .  isosorbide mononitrate  30 mg Oral Daily  . multivitamin with minerals  1 tablet Oral Daily  . pantoprazole  40 mg Oral Daily  . [START ON 10/14/2014] predniSONE  40 mg Oral Q breakfast  . sodium chloride  3 mL Intravenous Q12H   ROS: as per HPI   Blood pressure 138/46, pulse 57, temperature 97.9 F (36.6 C), temperature source Oral, resp. rate 18, height _0  (1.651 m), weight 141.84 kg (312 lb 11.2 oz), SpO2 99 %.  General appearance: alert and cooperative Head: Normocephalic, without obvious abnormality, atraumatic Eyes: negative  EOMI Nose: Nares normal. Septum midline. Mucosa normal. No drainage or sinus tenderness. Throat: lips, mucosa, and tongue normal; teeth and gums normal Resp: clear to auscultation bilaterally few wheezes Chest wall: no tenderness Cardio: regular rate and rhythm, S1, S2 normal, no murmur, click, rub or gallop GI: soft, non-tender; bowel sounds normal; no masses,  no organomegaly Extremities: edema tr Skin: Skin color, texture, turgor normal. No rashes or lesions Neurologic: Grossly normal Results for orders placed or performed during the hospital encounter of 10/12/14 (from the past 48 hour(s))  CBC with Differential     Status: Abnormal   Collection Time: 10/12/14  1:00 AM  Result Value Ref Range   WBC 8.0 4.0 - 10.5 K/uL   RBC 4.33 3.87 - 5.11 MIL/uL   Hemoglobin 11.1 (L) 12.0 -  15.0 g/dL   HCT 36.0 36.0 - 46.0 %   MCV 83.1 78.0 - 100.0 fL   MCH 25.6 (L) 26.0 - 34.0 pg   MCHC 30.8 30.0 - 36.0 g/dL   RDW 13.4 11.5 - 15.5 %   Platelets 278 150 - 400 K/uL   Neutrophils Relative % 62 43 - 77 %   Neutro Abs 4.9 1.7 - 7.7 K/uL   Lymphocytes Relative 30 12 - 46 %   Lymphs Abs 2.4 0.7 - 4.0 K/uL   Monocytes Relative 6 3 - 12 %   Monocytes Absolute 0.5 0.1 - 1.0 K/uL   Eosinophils Relative 2 0 - 5 %   Eosinophils Absolute 0.1 0.0 - 0.7 K/uL   Basophils Relative 0 0 - 1 %   Basophils Absolute 0.0 0.0 - 0.1 K/uL  Comprehensive metabolic panel      Status: Abnormal   Collection Time: 10/12/14  1:00 AM  Result Value Ref Range   Sodium 137 135 - 145 mmol/L   Potassium 4.3 3.5 - 5.1 mmol/L   Chloride 103 101 - 111 mmol/L   CO2 20 (L) 22 - 32 mmol/L   Glucose, Bld 352 (H) 65 - 99 mg/dL   BUN 42 (H) 6 - 20 mg/dL   Creatinine, Ser 3.60 (H) 0.44 - 1.00 mg/dL   Calcium 7.2 (L) 8.9 - 10.3 mg/dL   Total Protein 6.9 6.5 - 8.1 g/dL   Albumin 3.1 (L) 3.5 - 5.0 g/dL   AST 59 (H) 15 - 41 U/L   ALT 37 14 - 54 U/L   Alkaline Phosphatase 101 38 - 126 U/L   Total Bilirubin 0.2 (L) 0.3 - 1.2 mg/dL   GFR calc non Af Amer 12 (L) >60 mL/min   GFR calc Af Amer 14 (L) >60 mL/min    Comment: (NOTE) The eGFR has been calculated using the CKD EPI equation. This calculation has not been validated in all clinical situations. eGFR's persistently <60 mL/min signify possible Chronic Kidney Disease.    Anion gap 14 5 - 15  Brain natriuretic peptide     Status: Abnormal   Collection Time: 10/12/14  1:00 AM  Result Value Ref Range   B Natriuretic Peptide 143.8 (H) 0.0 - 100.0 pg/mL  I-Stat arterial blood gas, ED     Status: Abnormal   Collection Time: 10/12/14  1:11 AM  Result Value Ref Range   pH, Arterial 7.206 (L) 7.350 - 7.450   pCO2 arterial 64.1 (HH) 35.0 - 45.0 mmHg   pO2, Arterial 252.0 (H) 80.0 - 100.0 mmHg   Bicarbonate 25.4 (H) 20.0 - 24.0 mEq/L   TCO2 27 0 - 100 mmol/L   O2 Saturation 100.0 %   Acid-base deficit 3.0 (H) 0.0 - 2.0 mmol/L   Patient temperature 98.6 F    Collection site RADIAL, ALLEN'S TEST ACCEPTABLE    Drawn by RT    Sample type ARTERIAL    Comment NOTIFIED PHYSICIAN   I-Stat Troponin, ED (not at [facility])     Status: None   Collection Time: 10/12/14  1:37 AM  Result Value Ref Range   Troponin i, poc 0.03 0.00 - 0.08 ng/mL   Comment 3            Comment: Due to the release kinetics of cTnI, a negative result within the first hours of the onset of symptoms does not rule out myocardial infarction with certainty. If  myocardial infarction is still suspected, repeat the test at  appropriate intervals.   MRSA PCR Screening     Status: None   Collection Time: 10/12/14  6:42 AM  Result Value Ref Range   MRSA by PCR NEGATIVE NEGATIVE    Comment:        The GeneXpert MRSA Assay (FDA approved for NASAL specimens only), is one component of a comprehensive MRSA colonization surveillance program. It is not intended to diagnose MRSA infection nor to guide or monitor treatment for MRSA infections.   Hemoglobin A1c     Status: Abnormal   Collection Time: 10/12/14  7:45 AM  Result Value Ref Range   Hgb A1c MFr Bld 9.4 (H) 4.8 - 5.6 %    Comment: (NOTE)         Pre-diabetes: 5.7 - 6.4         Diabetes: >6.4         Glycemic control for adults with diabetes: <7.0    Mean Plasma Glucose 223 mg/dL    Comment: (NOTE) Performed At: [facility] [place], [place] [id number] Lindon Romp MD [id number]   Lipid panel     Status: Abnormal   Collection Time: 10/12/14  7:45 AM  Result Value Ref Range   Cholesterol 199 0 - 200 mg/dL   Triglycerides 113 <150 mg/dL   HDL 53 >40 mg/dL   Total CHOL/HDL Ratio 3.8 RATIO   VLDL 23 0 - 40 mg/dL   LDL Cholesterol 123 (H) 0 - 99 mg/dL    Comment:        Total Cholesterol/HDL:CHD Risk Coronary Heart Disease Risk Table                     Men   Women  1/2 Average Risk   3.4   3.3  Average Risk       5.0   4.4  2 X Average Risk   9.6   7.1  3 X Average Risk  23.4   11.0        Use the calculated Patient Ratio above and the CHD Risk Table to determine the patient's CHD Risk.        ATP III CLASSIFICATION (LDL):  <100     mg/dL   Optimal  100-129  mg/dL   Near or Above                    Optimal  130-159  mg/dL   Borderline  160-189  mg/dL   High  >190     mg/dL   Very High   Troponin I (q 6hr x 3)     Status: Abnormal   Collection Time: 10/12/14  7:45 AM  Result Value Ref Range   Troponin I 0.51 (HH) <0.031 ng/mL    Comment:         POSSIBLE MYOCARDIAL ISCHEMIA. SERIAL TESTING RECOMMENDED. CRITICAL RESULT CALLED TO, READ BACK BY AND VERIFIED WITH: BENCA C RN 10/12/14 0931 COSTELLO B REPEATED TO VERIFY   Protime-INR     Status: None   Collection Time: 10/12/14  7:45 AM  Result Value Ref Range   Prothrombin Time 14.0 11.6 - 15.2 seconds   INR 1.06 0.00 - 1.49  APTT     Status: None   Collection Time: 10/12/14  7:45 AM  Result Value Ref Range   aPTT 33 24 - 37 seconds  Glucose, capillary     Status: Abnormal   Collection Time: 10/12/14  8:17 AM  Result  Value Ref Range   Glucose-Capillary 288 (H) 65 - 99 mg/dL   Comment 1 Capillary Specimen   Creatinine, urine, random     Status: None   Collection Time: 10/12/14 10:05 AM  Result Value Ref Range   Creatinine, Urine 37.77 mg/dL  Urinalysis, Routine w reflex microscopic (not at [facility])     Status: Abnormal   Collection Time: 10/12/14 10:05 AM  Result Value Ref Range   Color, Urine YELLOW YELLOW   APPearance CLEAR CLEAR   Specific Gravity, Urine 1.010 1.005 - 1.030   pH 5.0 5.0 - 8.0   Glucose, UA 250 (A) NEGATIVE mg/dL   Hgb urine dipstick SMALL (A) NEGATIVE   Bilirubin Urine NEGATIVE NEGATIVE   Ketones, ur NEGATIVE NEGATIVE mg/dL   Protein, ur >300 (A) NEGATIVE mg/dL   Urobilinogen, UA 0.2 0.0 - 1.0 mg/dL   Nitrite NEGATIVE NEGATIVE   Leukocytes, UA NEGATIVE NEGATIVE  Na and K (sodium & potassium), rand urine     Status: None   Collection Time: 10/12/14 10:05 AM  Result Value Ref Range   Sodium, Ur 117 mmol/L   Potassium Urine Timed 22 mmol/L  Urine microscopic-add on     Status: Abnormal   Collection Time: 10/12/14 10:05 AM  Result Value Ref Range   Squamous Epithelial / LPF RARE RARE   WBC, UA 3-6 <3 WBC/hpf   RBC / HPF 0-2 <3 RBC/hpf   Bacteria, UA MANY (A) RARE   Casts GRANULAR CAST (A) NEGATIVE   Urine-Other AMORPHOUS URATES/PHOSPHATES   Troponin I (q 6hr x 3)     Status: Abnormal   Collection Time: 10/12/14 10:53 AM  Result Value Ref  Range   Troponin I 0.58 (HH) <0.031 ng/mL    Comment:        POSSIBLE MYOCARDIAL ISCHEMIA. SERIAL TESTING RECOMMENDED. CRITICAL VALUE NOTED.  VALUE IS CONSISTENT WITH PREVIOUSLY REPORTED AND CALLED VALUE. REPEATED TO VERIFY   Glucose, capillary     Status: Abnormal   Collection Time: 10/12/14 12:21 PM  Result Value Ref Range   Glucose-Capillary 360 (H) 65 - 99 mg/dL   Comment 1 Capillary Specimen   Glucose, capillary     Status: Abnormal   Collection Time: 10/12/14  4:20 PM  Result Value Ref Range   Glucose-Capillary 350 (H) 65 - 99 mg/dL   Comment 1 Capillary Specimen   Troponin I (q 6hr x 3)     Status: Abnormal   Collection Time: 10/12/14  4:30 PM  Result Value Ref Range   Troponin I 0.60 (HH) <0.031 ng/mL    Comment:        POSSIBLE MYOCARDIAL ISCHEMIA. SERIAL TESTING RECOMMENDED. REPEATED TO VERIFY CRITICAL VALUE NOTED.  VALUE IS CONSISTENT WITH PREVIOUSLY REPORTED AND CALLED VALUE.   Glucose, capillary     Status: Abnormal   Collection Time: 10/12/14 10:10 PM  Result Value Ref Range   Glucose-Capillary 346 (H) 65 - 99 mg/dL  Heparin level (unfractionated)     Status: Abnormal   Collection Time: 10/12/14 10:46 PM  Result Value Ref Range   Heparin Unfractionated 0.15 (L) 0.30 - 0.70 IU/mL    Comment:        IF HEPARIN RESULTS ARE BELOW EXPECTED VALUES, AND PATIENT DOSAGE HAS BEEN CONFIRMED, SUGGEST FOLLOW UP TESTING OF ANTITHROMBIN III LEVELS.   Glucose, capillary     Status: Abnormal   Collection Time: 10/13/14  8:24 AM  Result Value Ref Range   Glucose-Capillary 328 (H) 65 - 99 mg/dL  Comment 1 Capillary Specimen   Basic metabolic panel     Status: Abnormal   Collection Time: 10/13/14  9:05 AM  Result Value Ref Range   Sodium 136 135 - 145 mmol/L   Potassium 4.8 3.5 - 5.1 mmol/L   Chloride 99 (L) 101 - 111 mmol/L   CO2 28 22 - 32 mmol/L   Glucose, Bld 300 (H) 65 - 99 mg/dL   BUN 53 (H) 6 - 20 mg/dL   Creatinine, Ser 3.90 (H) 0.44 - 1.00 mg/dL    Calcium 7.4 (L) 8.9 - 10.3 mg/dL   GFR calc non Af Amer 11 (L) >60 mL/min   GFR calc Af Amer 12 (L) >60 mL/min    Comment: (NOTE) The eGFR has been calculated using the CKD EPI equation. This calculation has not been validated in all clinical situations. eGFR's persistently <60 mL/min signify possible Chronic Kidney Disease.    Anion gap 9 5 - 15  CBC     Status: Abnormal   Collection Time: 10/13/14  9:05 AM  Result Value Ref Range   WBC 14.5 (H) 4.0 - 10.5 K/uL   RBC 3.95 3.87 - 5.11 MIL/uL   Hemoglobin 10.2 (L) 12.0 - 15.0 g/dL   HCT 32.9 (L) 36.0 - 46.0 %   MCV 83.3 78.0 - 100.0 fL   MCH 25.8 (L) 26.0 - 34.0 pg   MCHC 31.0 30.0 - 36.0 g/dL   RDW 13.6 11.5 - 15.5 %   Platelets 220 150 - 400 K/uL  Heparin level (unfractionated)     Status: None   Collection Time: 10/13/14  9:05 AM  Result Value Ref Range   Heparin Unfractionated 0.59 0.30 - 0.70 IU/mL    Comment:        IF HEPARIN RESULTS ARE BELOW EXPECTED VALUES, AND PATIENT DOSAGE HAS BEEN CONFIRMED, SUGGEST FOLLOW UP TESTING OF ANTITHROMBIN III LEVELS.   Glucose, capillary     Status: Abnormal   Collection Time: 10/13/14 11:58 AM  Result Value Ref Range   Glucose-Capillary 296 (H) 65 - 99 mg/dL   US Renal  10/12/2014   CLINICAL DATA:  Acute renal insufficiency  EXAM: RENAL / URINARY TRACT ULTRASOUND COMPLETE  COMPARISON:  None.  FINDINGS: Right Kidney:  Length: 11.7 cm.  Mildly increased echogenicity.  No hydronephrosis.  Left Kidney:  Length: 12.1 cm.  Mildly increased echogenicity.  No hydronephrosis.  Bladder:  Appears normal for degree of bladder distention.  IMPRESSION: Negative for hydronephrosis. There is mildly increased renal parenchymal echogenicity consistent with medical renal disease.   Electronically Signed   By: Andreas Newport M.D.   On: 10/12/2014 06:29   Dg Chest Portable 1 View  10/12/2014   CLINICAL DATA:  Respiratory distress  EXAM: PORTABLE CHEST - 1 VIEW  COMPARISON:  None.  FINDINGS: Central  and basilar airspace opacities are present bilaterally. There is cardiomegaly. There probably is a small left pleural effusion.  IMPRESSION: Extensive airspace opacities and central and basilar distribution. This may represent alveolar edema associated with congestive heart failure. Probable left effusion. Cannot exclude infectious infiltrates.   Electronically Signed   By: Andreas Newport M.D.   On: 10/12/2014 01:51   Dg Hip Unilat With Pelvis 2-3 Views Left  10/13/2014   CLINICAL DATA:  Left hip pain.  EXAM: LEFT HIP (WITH PELVIS) 2-3 VIEWS  COMPARISON:  None.  FINDINGS: The left hip is located. No acute bone or soft tissue abnormality is present. Degenerative changes are noted in the  SI joints bilaterally and lower lumbar spine.  IMPRESSION: 1. Normal radiographic appearance of the left hip. 2. Degenerative changes within the lower lumbar spine and SI joints.   Electronically Signed   By: San Morelle M.D.   On: 10/13/2014 09:48    Renal Assessment:  1 Stage IV CKD  Rec: 1 Minimize contrast and IVF hydration if possible per cardiology 2 Risk of CIN discussed with pt. Dorrance Sellick C 10/13/2014, 2:48 PM

## 2014-10-13 NOTE — Progress Notes (Signed)
CR on 6/17: 3.15 Echo: 09/08/14: No regional wall motion abnormalities, EF 55-60, mild LVH  Eulogio Bear

## 2014-10-13 NOTE — Progress Notes (Addendum)
TELEMETRY: Reviewed telemetry pt in NSR: Filed Vitals:   10/13/14 0000 10/13/14 0400 10/13/14 0825 10/13/14 0837  BP: 166/60 154/63 154/52 154/52  Pulse: 69 57 56 56  Temp: 98.1 F (36.7 C) 97.8 F (36.6 C) 97.9 F (36.6 C)   TempSrc: Oral Oral Oral   Resp: 21 13 13 13   Height:      Weight:  141.84 kg (312 lb 11.2 oz)    SpO2: 99% 99% 99% 99%    Intake/Output Summary (Last 24 hours) at 10/13/14 0857 Last data filed at 10/13/14 0600  Gross per 24 hour  Intake  700.5 ml  Output   1800 ml  Net -1099.5 ml   Filed Weights   10/12/14 0211 10/13/14 0400  Weight: 136.986 kg (302 lb) 141.84 kg (312 lb 11.2 oz)    Subjective Complains of left hip pain. No chest pain or SOB.   Marland Kitchen antiseptic oral rinse  7 mL Mouth Rinse BID  . aspirin EC  81 mg Oral Daily  . carvedilol  6.25 mg Oral BID WC  . cloNIDine  0.2 mg Oral TID  . furosemide  40 mg Intravenous Q12H  . gabapentin  300 mg Oral BID  . insulin aspart  0-15 Units Subcutaneous TID WC  . insulin aspart  0-5 Units Subcutaneous QHS  . insulin detemir  35 Units Subcutaneous Daily  . ipratropium-albuterol  3 mL Nebulization TID  . methylPREDNISolone (SOLU-MEDROL) injection  60 mg Intravenous Daily  . multivitamin with minerals  1 tablet Oral Daily  . pantoprazole  40 mg Oral Daily  . sodium chloride  3 mL Intravenous Q12H   . heparin 1,450 Units/hr (10/13/14 0615)    LABS: Basic Metabolic Panel:  Recent Labs  10/12/14 0100  NA 137  K 4.3  CL 103  CO2 20*  GLUCOSE 352*  BUN 42*  CREATININE 3.60*  CALCIUM 7.2*   Liver Function Tests:  Recent Labs  10/12/14 0100  AST 59*  ALT 37  ALKPHOS 101  BILITOT 0.2*  PROT 6.9  ALBUMIN 3.1*   No results for input(s): LIPASE, AMYLASE in the last 72 hours. CBC:  Recent Labs  10/12/14 0100  WBC 8.0  NEUTROABS 4.9  HGB 11.1*  HCT 36.0  MCV 83.1  PLT 278   Cardiac Enzymes:  Recent Labs  10/12/14 0745 10/12/14 1053 10/12/14 1630  TROPONINI 0.51* 0.58*  0.60*   BNP: No results for input(s): PROBNP in the last 72 hours. D-Dimer: No results for input(s): DDIMER in the last 72 hours. Hemoglobin A1C:  Recent Labs  10/12/14 0745  HGBA1C 9.4*   Fasting Lipid Panel:  Recent Labs  10/12/14 0745  CHOL 199  HDL 53  LDLCALC 123*  TRIG 113  CHOLHDL 3.8   Thyroid Function Tests: No results for input(s): TSH, T4TOTAL, T3FREE, THYROIDAB in the last 72 hours.  Invalid input(s): FREET3   Radiology/Studies:  US Renal  10/12/2014   CLINICAL DATA:  Acute renal insufficiency  EXAM: RENAL / URINARY TRACT ULTRASOUND COMPLETE  COMPARISON:  None.  FINDINGS: Right Kidney:  Length: 11.7 cm.  Mildly increased echogenicity.  No hydronephrosis.  Left Kidney:  Length: 12.1 cm.  Mildly increased echogenicity.  No hydronephrosis.  Bladder:  Appears normal for degree of bladder distention.  IMPRESSION: Negative for hydronephrosis. There is mildly increased renal parenchymal echogenicity consistent with medical renal disease.   Electronically Signed   By: Andreas Newport M.D.   On: 10/12/2014 06:29   Dg Chest  Portable 1 View  10/12/2014   CLINICAL DATA:  Respiratory distress  EXAM: PORTABLE CHEST - 1 VIEW  COMPARISON:  None.  FINDINGS: Central and basilar airspace opacities are present bilaterally. There is cardiomegaly. There probably is a small left pleural effusion.  IMPRESSION: Extensive airspace opacities and central and basilar distribution. This may represent alveolar edema associated with congestive heart failure. Probable left effusion. Cannot exclude infectious infiltrates.   Electronically Signed   By: Andreas Newport M.D.   On: 10/12/2014 01:51   Ecg NSR with LBBB. LAD  Echo: Study Conclusions  - Left ventricle: The images without contrast are impossible to interpret. Contrast images show akinesis of the inferior septum, akinesis of the inferior wall, hypokinesis of the anterior septum, and akinesis of the anterior wall. The EF is  30%. - Mitral valve: Calcified annulus. - Right ventricle: The cavity size was normal. Systolic function was normal. - Pulmonary arteries: PA peak pressure: 57 mm Hg (S).  I have personally reviewed and interpreted this study.   PHYSICAL EXAM General: Well developed, obese, in no acute distress. Head: Normocephalic, atraumatic, sclera non-icteric, oropharynx is clear Neck: Negative for carotid bruits. JVD is elevated. No adenopathy Lungs: Clear bilaterally to auscultation without wheezes, rales, or rhonchi. Breathing is unlabored. Heart: RRR S1 S2 without murmurs, rubs, or gallops.  Abdomen: Soft, non-tender, non-distended with normoactive bowel sounds. No hepatomegaly. No rebound/guarding. No obvious abdominal masses. Msk:  Strength and tone appears normal for age. Extremities: 2+ edema with bullous lesions on legs.  Distal pedal pulses are 2+ and equal bilaterally. Neuro: Alert and oriented X 3. Moves all extremities spontaneously. Psych:  Responds to questions appropriately with a normal affect.  ASSESSMENT AND PLAN: 1. NSTEMI. Troponin elevation with flat curve suggests more of a demand type ischemia due to CHF and hypoxia. She has a LBBB of unknown duration. Echo demonstrates severe LV dysfunction with EF of 30% and regional wall motion abnormalities consistent with ischemic cardiomyopathy. She is high risk. I do not think that stress testing will add useful information. Since she is high risk I would favor a right and left heart cath to define coronary anatomy and define treatment options. She is at higher risk for contrast induced nephropathy. Will ask Renal to see today. Tentatively schedule for right and left heart cath tomorrow. Hold lasix tonight and begin hydration. Will need to minimize IV contrast (coronaries only). If PCI indicated this will need to be staged. Continue ASA and IV heparin. Add oral nitrates. Continue Coreg. The procedure and risks were reviewed including but not  limited to death, myocardial infarction, stroke, arrythmias, bleeding, transfusion, emergency surgery, dye allergy, or renal dysfunction. The patient voices understanding and is agreeable to proceed..  2. Acute on chronic systolic CHF. EF 30%. Diuresing well on IV lasix. I/O negative 1399 cc. Weigh increased. Breathing is doing much better and oxygen levels improved. Not a candidate for ACEi/ARB due to CKD. Continue Coreg. Add nitrates. I would hold clonidine and start hydralazine.   3. CKD stage 4. Repeat BMET pending. Baseline is unknown. Renal to see. Awaiting old records from [place].   4. DM poorly controlled. On insulin. Per primary team.   5. HTN  6. Hyperlipidemia. I would recommend continued statins. Elevated AST probably due to hepatic congestion with CHF.   7. COPD. I suspect much of her breathing issues more related to CHF and obesity.   8. History of breast CA  9. Obesity.  10. Pulmonary HTN  Present  on Admission:  . COPD (chronic obstructive pulmonary disease) . Hypertension . Breast cancer . Arthritis . Syncope . SOB (shortness of breath) . CKD (chronic kidney disease) . Acute on chronic respiratory failure . Chest pain with high risk for cardiac etiology . LBBB (left bundle branch block) . Elevated troponin  Signed, Haroon Shatto [place], [place] 10/13/2014 8:57 AM

## 2014-10-13 NOTE — Progress Notes (Signed)
[facility] for heparin Indication: chest pain/ACS  No Known Allergies  Patient Measurements: Height: 5\' 5"  (165.1 cm) Weight: (!) 312 lb 11.2 oz (141.84 kg) IBW/kg (Calculated) : 57 Heparin Dosing Weight: 89kg  Vital Signs: Temp: 97.9 F (36.6 C) (06/29 0825) Temp Source: Oral (06/29 0825) BP: 155/61 mmHg (06/29 0900) Pulse Rate: 59 (06/29 0900)  Labs:  Recent Labs  10/12/14 0100 10/12/14 0745 10/12/14 1053 10/12/14 1630 10/12/14 2246 10/13/14 0905  HGB 11.1*  --   --   --   --  10.2*  HCT 36.0  --   --   --   --  32.9*  PLT 278  --   --   --   --  220  APTT  --  33  --   --   --   --   LABPROT  --  14.0  --   --   --   --   INR  --  1.06  --   --   --   --   HEPARINUNFRC  --   --   --   --  0.15* 0.59  CREATININE 3.60*  --   --   --   --   --   TROPONINI  --  0.51* 0.58* 0.60*  --   --     Estimated Creatinine Clearance: 20.6 mL/min (by C-G formula based on Cr of 3.6).  Assessment: 74 yo female with here with SOB/elevated cardiac markers and noted with NSTEMI. Pharmacy is dosing heparin -Heparin level=  0.59, Hg= 10.2, plt= 220 -Noted for cath 6/30  Goal of Therapy:  Heparin level 0.3-0.7 units/ml Monitor platelets by anticoagulation protocol: Yes   Plan:  -No heparin changes needed -Will confirm a heparin level later today -Daily heparin level and CBC  Hildred Laser, Pharm D 10/13/2014 10:26 AM

## 2014-10-13 NOTE — Progress Notes (Signed)
ANTICOAGULATION CONSULT NOTE  Pharmacy Consult for heparin Indication: chest pain/ACS  No Known Allergies  Patient Measurements: Height: 5\' 5"  (165.1 cm) Weight: (!) 312 lb 11.2 oz (141.84 kg) IBW/kg (Calculated) : 57 Heparin Dosing Weight: 89kg  Vital Signs: Temp: 98 F (36.7 C) (06/29 1628) Temp Source: Oral (06/29 1628) BP: 138/83 mmHg (06/29 1628) Pulse Rate: 55 (06/29 1628)  Labs:  Recent Labs  10/12/14 0100 10/12/14 0745 10/12/14 1053 10/12/14 1630 10/12/14 2246 10/13/14 0905 10/13/14 1656  HGB 11.1*  --   --   --   --  10.2*  --   HCT 36.0  --   --   --   --  32.9*  --   PLT 278  --   --   --   --  220  --   APTT  --  33  --   --   --   --   --   LABPROT  --  14.0  --   --   --   --   --   INR  --  1.06  --   --   --   --   --   HEPARINUNFRC  --   --   --   --  0.15* 0.59 0.68  CREATININE 3.60*  --   --   --   --  3.90*  --   TROPONINI  --  0.51* 0.58* 0.60*  --   --   --     Estimated Creatinine Clearance: 19 mL/min (by C-G formula based on Cr of 3.9).  Assessment: 74 yo female with here with SOB/elevated cardiac markers and noted with NSTEMI. Pharmacy is dosing heparin -Heparin level=  0.68 this evening. Second therapetuic level at this rate, though is on the higher end of range.   Goal of Therapy:  Heparin level 0.3-0.7 units/ml Monitor platelets by anticoagulation protocol: Yes   Plan:  -decrease heparin slightly to 1400 units/hr so level does not become too high -Daily heparin level and CBC -Noted for cath 6/30  Cylee Dattilo D. Rima Blizzard, PharmD, BCPS Clinical Pharmacist Pager: [phone] 10/13/2014 5:31 PM

## 2014-10-14 ENCOUNTER — Encounter (HOSPITAL_COMMUNITY)
Admission: EM | Disposition: A | Discharge status: Home Health | Payer: Self-pay | Source: Home / Self Care | Attending: Family Medicine

## 2014-10-14 DIAGNOSIS — I5023 Acute on chronic systolic (congestive) heart failure: Secondary | ICD-10-CM

## 2014-10-14 DIAGNOSIS — E785 Hyperlipidemia, unspecified: Secondary | ICD-10-CM

## 2014-10-14 DIAGNOSIS — N184 Chronic kidney disease, stage 4 (severe): Secondary | ICD-10-CM

## 2014-10-14 DIAGNOSIS — I214 Non-ST elevation (NSTEMI) myocardial infarction: Secondary | ICD-10-CM

## 2014-10-14 LAB — BASIC METABOLIC PANEL
ANION GAP: 10 (ref 5–15)
BUN: 59 mg/dL — AB (ref 6–20)
CHLORIDE: 96 mmol/L — AB (ref 101–111)
CO2: 28 mmol/L (ref 22–32)
Calcium: 7 mg/dL — ABNORMAL LOW (ref 8.9–10.3)
Creatinine, Ser: 4.14 mg/dL — ABNORMAL HIGH (ref 0.44–1.00)
GFR calc Af Amer: 12 mL/min — ABNORMAL LOW (ref >60)
GFR calc non Af Amer: 10 mL/min — ABNORMAL LOW (ref >60)
Glucose, Bld: 316 mg/dL — ABNORMAL HIGH (ref 65–99)
Potassium: 4.7 mmol/L (ref 3.5–5.1)
Sodium: 134 mmol/L — ABNORMAL LOW (ref 135–145)

## 2014-10-14 LAB — CBC
HCT: 30.6 % — ABNORMAL LOW (ref 36.0–46.0)
Hemoglobin: 9.3 g/dL — ABNORMAL LOW (ref 12.0–15.0)
MCH: 25.1 pg — ABNORMAL LOW (ref 26.0–34.0)
MCHC: 30.4 g/dL (ref 30.0–36.0)
MCV: 82.7 fL (ref 78.0–100.0)
Platelets: 215 10*3/uL (ref 150–400)
RBC: 3.7 MIL/uL — ABNORMAL LOW (ref 3.87–5.11)
RDW: 13.6 % (ref 11.5–15.5)
WBC: 12.5 10*3/uL — ABNORMAL HIGH (ref 4.0–10.5)

## 2014-10-14 LAB — GLUCOSE, CAPILLARY
GLUCOSE-CAPILLARY: 257 mg/dL — AB (ref 65–99)
GLUCOSE-CAPILLARY: 309 mg/dL — AB (ref 65–99)
Glucose-Capillary: 218 mg/dL — ABNORMAL HIGH (ref 65–99)
Glucose-Capillary: 287 mg/dL — ABNORMAL HIGH (ref 65–99)

## 2014-10-14 LAB — HEPARIN LEVEL (UNFRACTIONATED): HEPARIN UNFRACTIONATED: 0.48 [IU]/mL (ref 0.30–0.70)

## 2014-10-14 SURGERY — RIGHT/LEFT HEART CATH AND CORONARY ANGIOGRAPHY

## 2014-10-14 MED ORDER — ATORVASTATIN CALCIUM 40 MG PO TABS
40.0000 mg | ORAL_TABLET | Freq: Every day | ORAL | Status: DC
  Administered 2014-10-14 – 2014-10-27 (×14): 40 mg via ORAL
  Filled 2014-10-14 (×14): qty 1

## 2014-10-14 MED ORDER — INSULIN ASPART 100 UNIT/ML ~~LOC~~ SOLN
7.0000 [IU] | Freq: Three times a day (TID) | SUBCUTANEOUS | Status: DC
  Administered 2014-10-14 – 2014-10-17 (×9): 7 [IU] via SUBCUTANEOUS

## 2014-10-14 MED ORDER — INSULIN DETEMIR 100 UNIT/ML ~~LOC~~ SOLN
50.0000 [IU] | Freq: Every day | SUBCUTANEOUS | Status: DC
  Administered 2014-10-14 – 2014-10-17 (×4): 50 [IU] via SUBCUTANEOUS
  Filled 2014-10-14 (×4): qty 0.5

## 2014-10-14 MED ORDER — SODIUM CHLORIDE 0.9 % IV SOLN
INTRAVENOUS | Status: DC
  Administered 2014-10-14 – 2014-10-15 (×2): via INTRAVENOUS

## 2014-10-14 MED ORDER — LIDOCAINE 5 % EX PTCH
1.0000 | MEDICATED_PATCH | CUTANEOUS | Status: DC
  Administered 2014-10-14 – 2014-10-27 (×13): 1 via TRANSDERMAL
  Filled 2014-10-14 (×14): qty 1

## 2014-10-14 NOTE — Progress Notes (Signed)
  1 Stage IV CKD ( Cr3.15 on 6/17 per Dr. Eliseo Squires), with acute component, likely hemodynamically mediated 2 Hypertension, labile  Rec: At risk for CIN with plans for usual preventative strategies Plans per cardiology regarding timing of cardiac intervention based upon clinical urgency   Subjective: Interval History: Feels better today  Objective: Vital signs in last 24 hours: Temp:  [97.5 F (36.4 [name])-98.3 F (36.8 [name])] 98.1 F (36.7 [name]) (06/30 1118) Pulse Rate:  [50-69] 57 (06/30 1118) Resp:  [12-20] 18 (06/30 1118) BP: (138-223)/(45-88) 157/63 mmHg (06/30 1118) SpO2:  [95 %-100 %] 98 % (06/30 1118) Weight:  [144.244 kg (318 lb)] 144.244 kg (318 lb) (06/30 0300) Weight change: 2.404 kg (5 lb 4.8 oz)  Intake/Output from previous day: 06/29 0701 - 06/30 0700 In: 2369.3 [P.O.:480; I.V.:1889.3] Out: 1520 [Urine:1520] Intake/Output this shift: Total I/O In: 1106.9 [P.O.:360; I.V.:746.9] Out: 700 [Urine:700]  General appearance: alert and cooperative Chest wall: no tenderness Cardio: regular rate and rhythm, S1, S2 normal, no murmur, click, rub or gallop Extremities: edema 1-2+\  Lungs decreased BS Pt weak  Lab Results:  Recent Labs  10/13/14 0905 10/14/14 0320  WBC 14.5* 12.5*  HGB 10.2* 9.3*  HCT 32.9* 30.6*  PLT 220 215   BMET:  Recent Labs  10/13/14 0905 10/14/14 0320  NA 136 134*  K 4.8 4.7  CL 99* 96*  CO2 28 28  GLUCOSE 300* 316*  BUN 53* 59*  CREATININE 3.90* 4.14*  CALCIUM 7.4* 7.0*   No results for input(s): PTH in the last 72 hours. Iron Studies: No results for input(s): IRON, TIBC, TRANSFERRIN, FERRITIN in the last 72 hours. Studies/Results: Dg Hip Unilat With Pelvis 2-3 Views Left  10/13/2014   CLINICAL DATA:  Left hip pain.  EXAM: LEFT HIP (WITH PELVIS) 2-3 VIEWS  COMPARISON:  None.  FINDINGS: The left hip is located. No acute bone or soft tissue abnormality is present. Degenerative changes are noted in the SI joints bilaterally and lower lumbar  spine.  IMPRESSION: 1. Normal radiographic appearance of the left hip. 2. Degenerative changes within the lower lumbar spine and SI joints.   Electronically Signed   By: San Morelle M.D.   On: 10/13/2014 09:48    Scheduled: . antiseptic oral rinse  7 mL Mouth Rinse BID  . aspirin EC  81 mg Oral Daily  . atorvastatin  40 mg Oral q1800  . carvedilol  6.25 mg Oral BID WC  . gabapentin  300 mg Oral BID  . hydrALAZINE  25 mg Oral 3 times per day  . insulin aspart  0-15 Units Subcutaneous TID WC  . insulin aspart  0-5 Units Subcutaneous QHS  . insulin aspart  7 Units Subcutaneous TID WC  . insulin detemir  50 Units Subcutaneous Daily  . ipratropium-albuterol  3 mL Nebulization TID  . isosorbide mononitrate  30 mg Oral Daily  . lidocaine  1 patch Transdermal Q24H  . multivitamin with minerals  1 tablet Oral Daily  . pantoprazole  40 mg Oral Daily  . sodium chloride  3 mL Intravenous Q12H  . sodium chloride  3 mL Intravenous Q12H      LOS: 2 days   Jordan [name] 10/14/2014,1:05 PM

## 2014-10-14 NOTE — Progress Notes (Signed)
[place] for heparin Indication: chest pain/ACS  No Known Allergies  Patient Measurements: Height: 5\' 5"  (165.1 cm) Weight: (!) 318 lb (144.244 kg) IBW/kg (Calculated) : 57 Heparin Dosing Weight: 89kg  Vital Signs: Temp: 98.3 F (36.8 C) (06/30 0813) Temp Source: Oral (06/30 0813) BP: 192/55 mmHg (06/30 0856) Pulse Rate: 65 (06/30 0856)  Labs:  Recent Labs  10/12/14 0100 10/12/14 0745 10/12/14 1053 10/12/14 1630  10/13/14 0905 10/13/14 1656 10/14/14 0320  HGB 11.1*  --   --   --   --  10.2*  --  9.3*  HCT 36.0  --   --   --   --  32.9*  --  30.6*  PLT 278  --   --   --   --  220  --  215  APTT  --  33  --   --   --   --   --   --   LABPROT  --  14.0  --   --   --   --   --   --   INR  --  1.06  --   --   --   --   --   --   HEPARINUNFRC  --   --   --   --   < > 0.59 0.68 0.48  CREATININE 3.60*  --   --   --   --  3.90*  --  4.14*  TROPONINI  --  0.51* 0.58* 0.60*  --   --   --   --   < > = values in this interval not displayed.  Estimated Creatinine Clearance: 18.1 mL/min (by C-G formula based on Cr of 4.14).  Assessment: 74 yo female with here with SOB/elevated cardiac markers and noted with NSTEMI. Pharmacy is dosing heparin -Heparin level=  048 and at goal, Hg= 9.3 and plt= 215. Cath cancelled today due to renal function.    Goal of Therapy:  Heparin level 0.3-0.7 units/ml Monitor platelets by anticoagulation protocol: Yes   Plan:  -No heparin changes needed -Will follow cath plans -Daily heparin level and CBC  Hildred Laser, Pharm D 10/14/2014 10:11 AM

## 2014-10-14 NOTE — Progress Notes (Addendum)
PROGRESS NOTE  Jordan [name] [id number] DOB: Oct 12, 1940 DOA: 10/12/2014 PCP: Pcp Not In [name] is a 74 y.o. female with PMH of hypertension, diabetes mellitus, GERD, breast cancer, COPD, arthritis, chronic kidney disease (unknown stage), chronic leg edema, who presents with shortness of breath and syncope. Patient comes here from [place] for visiting her daughter. She has been doing fine until 11:30 yesterday when she started having SOB. She does not have cough and chest pain. Per her daughter, and patient moved around in her bed and did some exertion, then she passed out for few seconds. She did not have seizure. No head injury. Patient reports that she is taking Bumex for chronic leg edema, but denies congestive heart failure. She states that her leg swelling has been worsening recently. Patient does not have abdominal pain, diarrhea. No symptoms for UTI. No unilateral weakness numbness or tenderness physician.   Assessment/Plan: SOB (shortness of breath) and acute on chronic respiratory failure:  ?CHF exacerbation vs COPD -treat CHF exacerbation and COPD as below  Possible CHF exacerbation:  Echo from 5/16 in [place] shows 55-60%- no wall abnormalities -continue coreg +CE - 2-D echo to evaluate EF here shows: Contrast images show akinesis of the inferior septum, akinesis of the inferior wall, hypokinesis of the anterior septum, and akinesis of the anterior wall. The EF is 30% -weight, i/os -No on ACEI because of CKD  COPD: Patient has very mild wheezing on lung auscultation, but no chest pain or cough. Does not seem to have acute exacerbation. -Nebulizers: scheduled Duoneb and prn albuterol -change to prednisone and d/c today -Robitussin for cough   Syncope: Etiology is not clear. Patient mental status is normal now. No signs of stroke. No signs of infection. Potential differential diagnosis is pulmonary embolism given shortness of breath, but patient  does not have any chest pain. Her shortness of breath can be explained by CHF and COPD.  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 Patient is taking levimir at home -titrate up levemir -SSI+ meal coverage - A1c: 9.4 -d/c steroids  Hx of Breast cancer: s/p of L mastectomy, chemotherapy and radiation therapy. Patient has been followed up with oncologist. Last seen was 2 months ago, was told that her breast cancer was stable. -Follow-up with her oncologist.  Hypertension: -Coreg, clonidine, nitrates  GERD: -Protonix  AKI on CKD (chronic kidney disease): baseline appears to be: 3.1/16 -renal consult - US-renal- medical renal disease - hold lasix -gentle IVF  NSTEMI -start heparin gtt  -on ASA -cath on hold due to renal issues Card consult  Hip pain -hip x ray does not show fracture -if pain continues may need CT scan for occult fracture rule out   Code Status:full Family Communication:  No family at bedside Disposition Plan:    Consultants:  Renal  cardiology  Procedures:      HPI/Subjective: Hip pain slightly better  Objective: Filed Vitals:   10/14/14 0400  BP:   Pulse: 50  Temp:   Resp: 12    Intake/Output Summary (Last 24 hours) at 10/14/14 0801 Last data filed at 10/14/14 0600  Gross per 24 hour  Intake 2354.77 ml  Output   1520 ml  Net 834.77 ml   Filed Weights   10/12/14 0211 10/13/14 0400 10/14/14 0300  Weight: 136.986 kg (302 lb) 141.84 kg (312 lb 11.2 oz) 144.244 kg (318 lb)    Exam:   General:  Obese, NAD  Cardiovascular: rrr  Respiratory: clear, no wheezing  Abdomen: +  Bs, soft  Musculoskeletal: pain on left hip with palpation  Data Reviewed: Basic Metabolic Panel:  Recent Labs Lab 10/12/14 0100 10/13/14 0905 10/14/14 0320  NA 137 136 134*  K 4.3 4.8 4.7  CL 103 99* 96*  CO2 20* 28 28  GLUCOSE 352* 300* 316*  BUN 42* 53* 59*  CREATININE 3.60* 3.90* 4.14*  CALCIUM 7.2* 7.4* 7.0*   Liver Function Tests:  Recent Labs Lab  10/12/14 0100  AST 59*  ALT 37  ALKPHOS 101  BILITOT 0.2*  PROT 6.9  ALBUMIN 3.1*   No results for input(s): LIPASE, AMYLASE in the last 168 hours. No results for input(s): AMMONIA in the last 168 hours. CBC:  Recent Labs Lab 10/12/14 0100 10/13/14 0905 10/14/14 0320  WBC 8.0 14.5* 12.5*  NEUTROABS 4.9  --   --   HGB 11.1* 10.2* 9.3*  HCT 36.0 32.9* 30.6*  MCV 83.1 83.3 82.7  PLT 278 220 215   Cardiac Enzymes:  Recent Labs Lab 10/12/14 0745 10/12/14 1053 10/12/14 1630  TROPONINI 0.51* 0.58* 0.60*   BNP (last 3 results)  Recent Labs  10/12/14 0100  BNP 143.8*    ProBNP (last 3 results) No results for input(s): PROBNP in the last 8760 hours.  CBG:  Recent Labs Lab 10/12/14 2210 10/13/14 0824 10/13/14 1158 10/13/14 1627 10/13/14 2142  GLUCAP 346* 328* 296* 319* 383*    Recent Results (from the past 240 hour(s))  MRSA PCR Screening     Status: None   Collection Time: 10/12/14  6:42 AM  Result Value Ref Range Status   MRSA by PCR NEGATIVE NEGATIVE Final    Comment:        The GeneXpert MRSA Assay (FDA approved for NASAL specimens only), is one component of a comprehensive MRSA colonization surveillance program. It is not intended to diagnose MRSA infection nor to guide or monitor treatment for MRSA infections.      Studies: Dg Hip Unilat With Pelvis 2-3 Views Left  10/13/2014   CLINICAL DATA:  Left hip pain.  EXAM: LEFT HIP (WITH PELVIS) 2-3 VIEWS  COMPARISON:  None.  FINDINGS: The left hip is located. No acute bone or soft tissue abnormality is present. Degenerative changes are noted in the SI joints bilaterally and lower lumbar spine.  IMPRESSION: 1. Normal radiographic appearance of the left hip. 2. Degenerative changes within the lower lumbar spine and SI joints.   Electronically Signed   By: San Morelle M.D.   On: 10/13/2014 09:48    Scheduled Meds: . antiseptic oral rinse  7 mL Mouth Rinse BID  . aspirin EC  81 mg Oral Daily   . carvedilol  6.25 mg Oral BID WC  . cloNIDine  0.1 mg Oral TID  . gabapentin  300 mg Oral BID  . hydrALAZINE  25 mg Oral 3 times per day  . insulin aspart  0-15 Units Subcutaneous TID WC  . insulin aspart  0-5 Units Subcutaneous QHS  . insulin aspart  7 Units Subcutaneous TID WC  . insulin detemir  50 Units Subcutaneous Daily  . ipratropium-albuterol  3 mL Nebulization TID  . isosorbide mononitrate  30 mg Oral Daily  . multivitamin with minerals  1 tablet Oral Daily  . pantoprazole  40 mg Oral Daily  . predniSONE  40 mg Oral Q breakfast  . sodium chloride  3 mL Intravenous Q12H  . sodium chloride  3 mL Intravenous Q12H   Continuous Infusions: . sodium chloride 1 mL/kg/hr (  10/14/14 0303)  . heparin 1,400 Units/hr (10/14/14 0000)   Antibiotics Given (last 72 hours)    None      Principal Problem:   SOB (shortness of breath) Active Problems:   Breast cancer   Diabetes mellitus without complication   Hypertension   Arthritis   COPD (chronic obstructive pulmonary disease)   Syncope   CKD (chronic kidney disease)   Acute on chronic respiratory failure   Chest pain with high risk for cardiac etiology   LBBB (left bundle branch block)   Elevated troponin   Acute systolic CHF (congestive heart failure)   NSTEMI (non-ST elevated myocardial infarction)   Pulmonary HTN    Time spent: 35 min    Kitt Ledet  [facility] Pager [phone]. If 7PM-7AM, please contact night-coverage at [email or web address], password [facility] 10/14/2014, 8:01 AM  LOS: 2 days

## 2014-10-14 NOTE — Progress Notes (Signed)
Physical Therapy Treatment Patient Details Name: Jordan [name] MRN: [id number] DOB: 07/26/1940 Today's Date: 10/14/2014    History of Present Illness 74 y.o. female with PMH of hypertension, diabetes mellitus, GERD, breast cancer, COPD, arthritis, chronic kidney disease (unknown stage), chronic leg edema, who presents with shortness of breath and syncope. Pt visiting from [place] for 3 weeks where she plans to return to care for sister s/p CVA and her spouse is in a SNF in [place].     PT Comments    Pt continues to be very motivated to return to caring for herself and her family. Pt with sats 96% on RA and with first gait trial dropped to 80% with 46'. With seated rest and 2L recovered to 96% and maintained 92% on 2L with return gait. HR 60-70 throughout. Encouraged increased mobility and bil LE HEP throughout the day and pt reports left hip pain with gait limiting distance.   Follow Up Recommendations  Home health PT;Supervision for mobility/OOB     Equipment Recommendations       Recommendations for Other Services       Precautions / Restrictions Precautions Precautions: Fall Precaution Comments: watch sats    Mobility  Bed Mobility               General bed mobility comments: in chair on arrival  Transfers Overall transfer level: Needs assistance   Transfers: Sit to/from Stand Sit to Stand: Min guard         General transfer comment: cues for sequence  Ambulation/Gait Ambulation/Gait assistance: Min guard Ambulation Distance (Feet): 46 Feet Assistive device: Rolling walker (2 wheeled) Gait Pattern/deviations: Step-through pattern;Decreased stride length;Trunk flexed   Gait velocity interpretation: Below normal speed for age/gender General Gait Details: cues for posture and position in RW with pt maintaining flexed trunk but able to support herself on RUE today with gait. seated rest between two gait trials of 46' each   Stairs             Wheelchair Mobility    Modified Rankin (Stroke Patients Only)       Balance Overall balance assessment: Needs assistance   Sitting balance-Leahy Scale: Good       Standing balance-Leahy Scale: Poor                      Cognition Arousal/Alertness: Awake/alert Behavior During Therapy: WFL for tasks assessed/performed Overall Cognitive Status: Within Functional Limits for tasks assessed                      Exercises General Exercises - Lower Extremity Long Arc Quad: AROM;Seated;Both;20 reps Hip Flexion/Marching: AROM;Seated;Both;20 reps    General Comments        Pertinent Vitals/Pain Pain Assessment: 0-10 Pain Score: 5  Pain Location: left hip pain at rest with increase to 8/10 with gait Pain Descriptors / Indicators: Aching Pain Intervention(s): Limited activity within patient's tolerance;Repositioned    Home Living                      Prior Function            PT Goals (current goals can now be found in the care plan section) Progress towards PT goals: Progressing toward goals    Frequency       PT Plan Current plan remains appropriate    Co-evaluation             End of Session  Equipment Utilized During Treatment: Oxygen Activity Tolerance: Patient tolerated treatment well Patient left: in chair;with call bell/phone within [name]     Time: 1131-1156 PT Time Calculation (min) (ACUTE ONLY): 25 min  Charges:  $Gait Training: 8-22 mins $Therapeutic Exercise: 8-22 mins                    G Codes:      Jordan [name] 2014-10-25, 1:51 PM Jordan [name], [place]

## 2014-10-14 NOTE — Progress Notes (Signed)
TELEMETRY: Reviewed telemetry pt in NSR: Filed Vitals:   10/13/14 2000 10/14/14 0000 10/14/14 0300 10/14/14 0400  BP: 138/55  167/66   Pulse: 53 69 51 50  Temp:   97.5 F (36.4 C)   TempSrc:   Oral   Resp: 17 20 12 12   Height:      Weight:   144.244 kg (318 lb)   SpO2: 100% 100% 100% 100%    Intake/Output Summary (Last 24 hours) at 10/14/14 0807 Last data filed at 10/14/14 0600  Gross per 24 hour  Intake 2354.77 ml  Output   1520 ml  Net 834.77 ml   Filed Weights   10/12/14 0211 10/13/14 0400 10/14/14 0300  Weight: 136.986 kg (302 lb) 141.84 kg (312 lb 11.2 oz) 144.244 kg (318 lb)    Subjective Still complains of left hip pain. Minor left chest pain. Denies SOB.    Marland Kitchen antiseptic oral rinse  7 mL Mouth Rinse BID  . aspirin EC  81 mg Oral Daily  . carvedilol  6.25 mg Oral BID WC  . cloNIDine  0.1 mg Oral TID  . gabapentin  300 mg Oral BID  . hydrALAZINE  25 mg Oral 3 times per day  . insulin aspart  0-15 Units Subcutaneous TID WC  . insulin aspart  0-5 Units Subcutaneous QHS  . insulin aspart  7 Units Subcutaneous TID WC  . insulin detemir  50 Units Subcutaneous Daily  . ipratropium-albuterol  3 mL Nebulization TID  . isosorbide mononitrate  30 mg Oral Daily  . multivitamin with minerals  1 tablet Oral Daily  . pantoprazole  40 mg Oral Daily  . predniSONE  40 mg Oral Q breakfast  . sodium chloride  3 mL Intravenous Q12H  . sodium chloride  3 mL Intravenous Q12H   . sodium chloride    . heparin 1,400 Units/hr (10/14/14 0000)    LABS: Basic Metabolic Panel:  Recent Labs  10/13/14 0905 10/14/14 0320  NA 136 134*  K 4.8 4.7  CL 99* 96*  CO2 28 28  GLUCOSE 300* 316*  BUN 53* 59*  CREATININE 3.90* 4.14*  CALCIUM 7.4* 7.0*   Liver Function Tests:  Recent Labs  10/12/14 0100  AST 59*  ALT 37  ALKPHOS 101  BILITOT 0.2*  PROT 6.9  ALBUMIN 3.1*   No results for input(s): LIPASE, AMYLASE in the last 72 hours. CBC:  Recent Labs  10/12/14 0100  10/13/14 0905 10/14/14 0320  WBC 8.0 14.5* 12.5*  NEUTROABS 4.9  --   --   HGB 11.1* 10.2* 9.3*  HCT 36.0 32.9* 30.6*  MCV 83.1 83.3 82.7  PLT 278 220 215   Cardiac Enzymes:  Recent Labs  10/12/14 0745 10/12/14 1053 10/12/14 1630  TROPONINI 0.51* 0.58* 0.60*   BNP: No results for input(s): PROBNP in the last 72 hours. D-Dimer: No results for input(s): DDIMER in the last 72 hours. Hemoglobin A1C:  Recent Labs  10/12/14 0745  HGBA1C 9.4*   Fasting Lipid Panel:  Recent Labs  10/12/14 0745  CHOL 199  HDL 53  LDLCALC 123*  TRIG 113  CHOLHDL 3.8   Thyroid Function Tests: No results for input(s): TSH, T4TOTAL, T3FREE, THYROIDAB in the last 72 hours.  Invalid input(s): FREET3   Radiology/Studies:  Dg Hip Unilat With Pelvis 2-3 Views Left  10/13/2014   CLINICAL DATA:  Left hip pain.  EXAM: LEFT HIP (WITH PELVIS) 2-3 VIEWS  COMPARISON:  None.  FINDINGS: The left  hip is located. No acute bone or soft tissue abnormality is present. Degenerative changes are noted in the SI joints bilaterally and lower lumbar spine.  IMPRESSION: 1. Normal radiographic appearance of the left hip. 2. Degenerative changes within the lower lumbar spine and SI joints.   Electronically Signed   By: San Morelle M.D.   On: 10/13/2014 09:48   Ecg NSR with LBBB. LAD  Echo: Study Conclusions  - Left ventricle: The images without contrast are impossible to interpret. Contrast images show akinesis of the inferior septum, akinesis of the inferior wall, hypokinesis of the anterior septum, and akinesis of the anterior wall. The EF is 30%. - Mitral valve: Calcified annulus. - Right ventricle: The cavity size was normal. Systolic function was normal. - Pulmonary arteries: PA peak pressure: 57 mm Hg (S).  I have personally reviewed and interpreted this study.   PHYSICAL EXAM General: Well developed, obese, in no acute distress. Head: Normocephalic, atraumatic, sclera non-icteric,  oropharynx is clear Neck: Negative for carotid bruits. JVD is elevated. No adenopathy Lungs: Clear bilaterally to auscultation without wheezes, rales, or rhonchi. Breathing is unlabored. Heart: RRR S1 S2 without murmurs, rubs, or gallops.  Abdomen: Soft, non-tender, non-distended with normoactive bowel sounds. No hepatomegaly. No rebound/guarding. No obvious abdominal masses. Msk:  Strength and tone appears normal for age. Extremities: 2+ edema with bullous lesions on legs.  Distal pedal pulses are 2+ and equal bilaterally. Neuro: Alert and oriented X 3. Moves all extremities spontaneously. Psych:  Responds to questions appropriately with a normal affect.  ASSESSMENT AND PLAN: 1. NSTEMI. Troponin elevation with flat curve suggests more of a demand type ischemia due to CHF and hypoxia. She has a LBBB of unknown duration. Echo demonstrates severe LV dysfunction with EF of 30% and regional wall motion abnormalities consistent with ischemic cardiomyopathy. These findings are new compared to Echo she had in [place] in May. She is high risk.  Since she is high risk I would favor a right and left heart cath to define coronary anatomy and define treatment options. Cath was planned for today but renal function is worse despite overnight hydration. We will need to postpone cardiac cath until renal function has stabilized. Renal input appreciated.   2. Acute on chronic systolic CHF. EF 30%. I/O positive 863 cc with hydration.  Weigh increased. Breathing is doing much better and oxygen levels are good. Not a candidate for ACEi/ARB due to CKD. Continue Coreg. Add nitrates. I would DC clonidine and titrate hydralazine as tolerated. Will reduce IVF to 75 cc/hr and hold diuretics for now.   3. CKD stage 4. Baseline creatinine 3.15 on June 8. Now rising due to acute cardiac issues with CHF. Will hold lasix and continue hydration today. Renal following.   4. DM poorly controlled. On insulin. Per primary team.   5.  HTN  6. Hyperlipidemia. I would recommend continued statins. Elevated AST probably due to hepatic congestion with CHF.   7. COPD. I suspect much of her breathing issues more related to CHF and obesity.   8. History of breast CA  9. Obesity.  10. Pulmonary HTN  Present on Admission:  . COPD (chronic obstructive pulmonary disease) . Hypertension . Breast cancer . Arthritis . Syncope . SOB (shortness of breath) . CKD (chronic kidney disease) . Acute on chronic respiratory failure . Chest pain with high risk for cardiac etiology . LBBB (left bundle branch block) . Elevated troponin . Acute systolic CHF (congestive heart failure) . NSTEMI (non-ST  elevated myocardial infarction) . Pulmonary HTN  Signed, Peter Martinique, [place] 10/14/2014 8:07 AM

## 2014-10-15 ENCOUNTER — Inpatient Hospital Stay (HOSPITAL_COMMUNITY): Payer: Medicare (Managed Care)

## 2014-10-15 DIAGNOSIS — I270 Primary pulmonary hypertension: Secondary | ICD-10-CM

## 2014-10-15 DIAGNOSIS — N179 Acute kidney failure, unspecified: Secondary | ICD-10-CM | POA: Insufficient documentation

## 2014-10-15 LAB — GLUCOSE, CAPILLARY
GLUCOSE-CAPILLARY: 191 mg/dL — AB (ref 65–99)
GLUCOSE-CAPILLARY: 252 mg/dL — AB (ref 65–99)
Glucose-Capillary: 253 mg/dL — ABNORMAL HIGH (ref 65–99)
Glucose-Capillary: 186 mg/dL — ABNORMAL HIGH (ref 65–99)

## 2014-10-15 LAB — BASIC METABOLIC PANEL
Anion gap: 8 (ref 5–15)
BUN: 71 mg/dL — ABNORMAL HIGH (ref 6–20)
CHLORIDE: 102 mmol/L (ref 101–111)
CO2: 27 mmol/L (ref 22–32)
Calcium: 6.9 mg/dL — ABNORMAL LOW (ref 8.9–10.3)
Creatinine, Ser: 4.03 mg/dL — ABNORMAL HIGH (ref 0.44–1.00)
GFR calc Af Amer: 12 mL/min — ABNORMAL LOW (ref >60)
GFR, EST NON AFRICAN AMERICAN: 10 mL/min — AB (ref >60)
GLUCOSE: 239 mg/dL — AB (ref 65–99)
Potassium: 4.7 mmol/L (ref 3.5–5.1)
Sodium: 137 mmol/L (ref 135–145)

## 2014-10-15 LAB — CBC
HEMATOCRIT: 30.9 % — AB (ref 36.0–46.0)
Hemoglobin: 9.5 g/dL — ABNORMAL LOW (ref 12.0–15.0)
MCH: 25.3 pg — AB (ref 26.0–34.0)
MCHC: 30.7 g/dL (ref 30.0–36.0)
MCV: 82.2 fL (ref 78.0–100.0)
Platelets: 229 10*3/uL (ref 150–400)
RBC: 3.76 MIL/uL — ABNORMAL LOW (ref 3.87–5.11)
RDW: 13.8 % (ref 11.5–15.5)
WBC: 12.9 10*3/uL — ABNORMAL HIGH (ref 4.0–10.5)

## 2014-10-15 LAB — HEPARIN LEVEL (UNFRACTIONATED): HEPARIN UNFRACTIONATED: 0.35 [IU]/mL (ref 0.30–0.70)

## 2014-10-15 LAB — PROTIME-INR
INR: 1.09 (ref 0.00–1.49)
Prothrombin Time: 14.3 seconds (ref 11.6–15.2)

## 2014-10-15 MED ORDER — HYDROCODONE-ACETAMINOPHEN 5-325 MG PO TABS
1.0000 | ORAL_TABLET | Freq: Four times a day (QID) | ORAL | Status: DC | PRN
  Administered 2014-10-15 (×2): 2 via ORAL
  Administered 2014-10-16: 1 via ORAL
  Filled 2014-10-15 (×2): qty 2
  Filled 2014-10-15: qty 1

## 2014-10-15 MED ORDER — HYDRALAZINE HCL 50 MG PO TABS
50.0000 mg | ORAL_TABLET | Freq: Three times a day (TID) | ORAL | Status: DC
  Administered 2014-10-15 – 2014-10-17 (×6): 50 mg via ORAL
  Filled 2014-10-15 (×9): qty 1

## 2014-10-15 MED ORDER — DEXTROSE 5 % IV SOLN
100.0000 mg | Freq: Three times a day (TID) | INTRAVENOUS | Status: DC
  Administered 2014-10-15 – 2014-10-16 (×4): 100 mg via INTRAVENOUS
  Filled 2014-10-15 (×7): qty 10

## 2014-10-15 MED ORDER — ISOSORBIDE MONONITRATE ER 60 MG PO TB24
60.0000 mg | ORAL_TABLET | Freq: Every day | ORAL | Status: DC
  Administered 2014-10-15 – 2014-10-16 (×2): 60 mg via ORAL
  Filled 2014-10-15 (×3): qty 1

## 2014-10-15 MED ORDER — CARVEDILOL 12.5 MG PO TABS
12.5000 mg | ORAL_TABLET | Freq: Two times a day (BID) | ORAL | Status: DC
  Administered 2014-10-15 – 2014-10-16 (×3): 12.5 mg via ORAL
  Filled 2014-10-15 (×6): qty 1

## 2014-10-15 NOTE — Progress Notes (Signed)
Inpatient Diabetes Program Recommendations  AACE/ADA: New Consensus Statement on Inpatient Glycemic Control (2013)  Target Ranges:  Prepandial:   less than 140 mg/dL      Peak postprandial:   less than 180 mg/dL (1-2 hours)      Critically ill patients:  140 - 180 mg/dL   Inpatient Diabetes Program Recommendations Insulin - Basal: Increase Levemir to home dose  Thank you  Raoul Pitch BSN, RN,CDE Inpatient Diabetes Coordinator [phone] (team pager)

## 2014-10-15 NOTE — Progress Notes (Signed)
[place] for heparin Indication: chest pain/ACS  No Known Allergies  Patient Measurements: Height: 5\' 5"  (165.1 cm) Weight: (!) 322 lb (146.058 kg) IBW/kg (Calculated) : 57 Heparin Dosing Weight: 89kg  Vital Signs: Temp: 97.8 F (36.6 C) (07/01 0300) Temp Source: Oral (07/01 0716) BP: 202/66 mmHg (07/01 0716) Pulse Rate: 72 (07/01 0800)  Labs:  Recent Labs  10/12/14 1053 10/12/14 1630  10/13/14 0905 10/13/14 1656 10/14/14 0320 10/15/14 0324  HGB  --   --   < > 10.2*  --  9.3* 9.5*  HCT  --   --   --  32.9*  --  30.6* 30.9*  PLT  --   --   --  220  --  215 229  LABPROT  --   --   --   --   --   --  14.3  INR  --   --   --   --   --   --  1.09  HEPARINUNFRC  --   --   < > 0.59 0.68 0.48 0.35  CREATININE  --   --   --  3.90*  --  4.14* 4.03*  TROPONINI 0.58* 0.60*  --   --   --   --   --   < > = values in this interval not displayed.  Estimated Creatinine Clearance: 18.7 mL/min (by C-G formula based on Cr of 4.03).  Assessment: 74 yo female with here with SOB/elevated cardiac markers and noted with NSTEMI. Pharmacy is dosing heparin -Heparin level=  0.35 and at goal, Hg= 9.5 and plt= 229. Possible cath on Monday.     Goal of Therapy:  Heparin level 0.3-0.7 units/ml Monitor platelets by anticoagulation protocol: Yes   Plan:  -No heparin changes needed -Will follow cath plans -Daily heparin level and CBC  Hildred Laser, Pharm D 10/15/2014 9:28 AM

## 2014-10-15 NOTE — Progress Notes (Signed)
PROGRESS NOTE  Jordan [name] [id number] DOB: Mar 18, 1941 DOA: 10/12/2014 PCP: Pcp Not In [place] [name] is a 74 y.o. female with PMH of hypertension, diabetes mellitus, GERD, breast cancer, COPD, arthritis, chronic kidney disease (unknown stage), chronic leg edema, who presents with shortness of breath and syncope. Patient comes here from [place] for visiting her daughter. She had been doing fine until 11:30 on night of admission when she started having SOB. She does not have cough and chest pain. Per her daughter, patient  did some exertion, then she passed out for few seconds. She did not have seizure. No head injury. Patient reports that she is taking Bumex for chronic leg edema, but denies congestive heart failure. She states that her leg swelling has been worsening recently.  Patient was found to have NSTEMI and is in need of a cath.  Cath has been postponed while her kidney function is optimized.   Assessment/Plan: SOB (shortness of breath) and acute on chronic respiratory failure:  ?CHF exacerbation vs COPD -treat CHF exacerbation and COPD as below-- most likely CHF  Possible CHF exacerbation:  Echo from 5/16 in [place] shows 55-60%- no wall abnormalities -continue coreg +CE - 2-D echo to evaluate EF here shows: Contrast images show akinesis of the inferior septum, akinesis of the inferior wall, hypokinesis of the anterior septum, and akinesis of the anterior wall. The EF is 30% -weight, i/os- weight up -No on ACEI because of CKD  COPD:  - Does not seem to have acute exacerbation- wheezing was most likely fluid -Nebulizers: scheduled Duoneb and prn albuterol -change to  PO prednisone and d/c 7/1 -Robitussin for cough   DM-II:  -titrate up levemir closer to home dose (watch kidney function) -SSI+ meal coverage - A1c: 9.4 -d/c steroids  Hx of Breast cancer: s/p of L mastectomy, chemotherapy and radiation therapy. Patient has been followed up with  oncologist. Last seen was 2 months ago, was told that her breast cancer was stable. -Follow-up with her oncologist in [place].  Hypertension: -Coreg, nitrates  GERD: -Protonix  AKI on CKD (chronic kidney disease): baseline appears to be: 3.1/16 -renal consult - US-renal- medical renal disease - high dose lasix resumed-- may need dialysis sooner rather than later  NSTEMI - heparin gtt  - ASA -cath on hold due to renal issues Card consulted  Hip pain -hip x ray does not show fracture -if pain continues may need CT scan for occult fracture rule out  obesity -encourage weight loss   Code Status:full Family Communication:  No family at bedside Disposition Plan:    Consultants:  Renal  cardiology  Procedures:      HPI/Subjective: C/o pain in b/l (LE tight with fluid)  Objective: Filed Vitals:   10/15/14 0716  BP: 202/66  Pulse: 72  Temp:   Resp: 20    Intake/Output Summary (Last 24 hours) at 10/15/14 0759 Last data filed at 10/15/14 0600  Gross per 24 hour  Intake 2846.5 ml  Output   1825 ml  Net 1021.5 ml   Filed Weights   10/13/14 0400 10/14/14 0300 10/15/14 0300  Weight: 141.84 kg (312 lb 11.2 oz) 144.244 kg (318 lb) 146.058 kg (322 lb)    Exam:   General:  Obese, NAD  Cardiovascular: rrr  Respiratory: crackles at bases  Abdomen: +Bs, soft  Musculoskeletal: +edema- tight  Data Reviewed: Basic Metabolic Panel:  Recent Labs Lab 10/12/14 0100 10/13/14 0905 10/14/14 0320 10/15/14 0324  NA 137 136 134* 137  K 4.3 4.8 4.7 4.7  CL 103 99* 96* 102  CO2 20* 28 28 27   GLUCOSE 352* 300* 316* 239*  BUN 42* 53* 59* 71*  CREATININE 3.60* 3.90* 4.14* 4.03*  CALCIUM 7.2* 7.4* 7.0* 6.9*   Liver Function Tests:  Recent Labs Lab 10/12/14 0100  AST 59*  ALT 37  ALKPHOS 101  BILITOT 0.2*  PROT 6.9  ALBUMIN 3.1*   No results for input(s): LIPASE, AMYLASE in the last 168 hours. No results for input(s): AMMONIA in the last 168  hours. CBC:  Recent Labs Lab 10/12/14 0100 10/13/14 0905 10/14/14 0320 10/15/14 0324  WBC 8.0 14.5* 12.5* 12.9*  NEUTROABS 4.9  --   --   --   HGB 11.1* 10.2* 9.3* 9.5*  HCT 36.0 32.9* 30.6* 30.9*  MCV 83.1 83.3 82.7 82.2  PLT 278 220 215 229   Cardiac Enzymes:  Recent Labs Lab 10/12/14 0745 10/12/14 1053 10/12/14 1630  TROPONINI 0.51* 0.58* 0.60*   BNP (last 3 results)  Recent Labs  10/12/14 0100  BNP 143.8*    ProBNP (last 3 results) No results for input(s): PROBNP in the last 8760 hours.  CBG:  Recent Labs Lab 10/13/14 2142 10/14/14 0815 10/14/14 1219 10/14/14 1635 10/14/14 2125  GLUCAP 383* 257* 218* 287* 309*    Recent Results (from the past 240 hour(s))  MRSA PCR Screening     Status: None   Collection Time: 10/12/14  6:42 AM  Result Value Ref Range Status   MRSA by PCR NEGATIVE NEGATIVE Final    Comment:        The GeneXpert MRSA Assay (FDA approved for NASAL specimens only), is one component of a comprehensive MRSA colonization surveillance program. It is not intended to diagnose MRSA infection nor to guide or monitor treatment for MRSA infections.      Studies: Dg Hip Unilat With Pelvis 2-3 Views Left  10/13/2014   CLINICAL DATA:  Left hip pain.  EXAM: LEFT HIP (WITH PELVIS) 2-3 VIEWS  COMPARISON:  None.  FINDINGS: The left hip is located. No acute bone or soft tissue abnormality is present. Degenerative changes are noted in the SI joints bilaterally and lower lumbar spine.  IMPRESSION: 1. Normal radiographic appearance of the left hip. 2. Degenerative changes within the lower lumbar spine and SI joints.   Electronically Signed   By: San Morelle M.D.   On: 10/13/2014 09:48    Scheduled Meds: . antiseptic oral rinse  7 mL Mouth Rinse BID  . aspirin EC  81 mg Oral Daily  . atorvastatin  40 mg Oral q1800  . carvedilol  6.25 mg Oral BID WC  . gabapentin  300 mg Oral BID  . hydrALAZINE  25 mg Oral 3 times per day  . insulin  aspart  0-15 Units Subcutaneous TID WC  . insulin aspart  0-5 Units Subcutaneous QHS  . insulin aspart  7 Units Subcutaneous TID WC  . insulin detemir  50 Units Subcutaneous Daily  . ipratropium-albuterol  3 mL Nebulization TID  . isosorbide mononitrate  30 mg Oral Daily  . lidocaine  1 patch Transdermal Q24H  . multivitamin with minerals  1 tablet Oral Daily  . pantoprazole  40 mg Oral Daily  . sodium chloride  3 mL Intravenous Q12H  . sodium chloride  3 mL Intravenous Q12H   Continuous Infusions: . sodium chloride 75 mL/hr at 10/15/14 0100  . heparin 1,400 Units/hr (10/14/14 2000)   Antibiotics Given (last 72  hours)    None      Principal Problem:   SOB (shortness of breath) Active Problems:   Breast cancer   Diabetes mellitus without complication   Hypertension   Arthritis   COPD (chronic obstructive pulmonary disease)   Syncope   CKD (chronic kidney disease)   Acute on chronic respiratory failure   Chest pain with high risk for cardiac etiology   LBBB (left bundle branch block)   Elevated troponin   Acute systolic CHF (congestive heart failure)   NSTEMI (non-ST elevated myocardial infarction)   Pulmonary HTN    Time spent: 35 min    Laurella Tull  [facility] Pager [phone]. If 7PM-7AM, please contact night-coverage at [email or web address], password [facility] 10/15/2014, 7:59 AM  LOS: 3 days

## 2014-10-15 NOTE — Progress Notes (Signed)
Occupational Therapy Treatment Patient Details Name: Jordan [name] MRN: [id number] DOB: [birth year] Today's Date: 10/15/2014    History of present illness 74 y.o. female with PMH of hypertension, diabetes mellitus, GERD, breast cancer, COPD, arthritis, chronic kidney disease (unknown stage), chronic leg edema, who presents with shortness of breath and syncope. Pt visiting from [place] for 3 weeks where she plans to return to care for sister s/p CVA and her spouse is in a SNF in [place].    OT comments  Focus of session on educating pt in energy conservation and instruction in use of adaptive equipment. Pt with LE pain and fatigue today, declined working on self care activities.  Will continue to follow.  Follow Up Recommendations  Home health OT    Equipment Recommendations       Recommendations for Other Services      Precautions / Restrictions Precautions Precautions: Fall Precaution Comments: watch sats Restrictions Weight Bearing Restrictions: No       Mobility Bed Mobility                  Transfers                      Balance                                   ADL                                         General ADL Comments: Pt up in chair. Declined working on bathing, dressing and standing at the sink due to not feeling well.  Used session to instruct in use of adaptive equipment for LB bathing and dressing and education in energy conservation strategies with handout issued.      Vision                     Perception     Praxis      Cognition   Behavior During Therapy: WFL for tasks assessed/performed Overall Cognitive Status: Within Functional Limits for tasks assessed                       Extremity/Trunk Assessment               Exercises     Shoulder Instructions       General Comments      Pertinent Vitals/ Pain       Pain Assessment: 0-10 Pain Score: 7  Pain Location:  LEs Pain Descriptors / Indicators: Grimacing Pain Intervention(s): Limited activity within patient's tolerance;Monitored during session  Home Living                                          Prior Functioning/Environment              Frequency Min 2X/week     Progress Toward Goals  OT Goals(current goals can now be found in the care plan section)  Progress towards OT goals: Progressing toward goals  Acute Rehab OT Goals Patient Stated Goal: return to [place] Time For Goal Achievement: 10/26/14  Plan Discharge plan remains appropriate    Co-evaluation  End of Session Equipment Utilized During Treatment: Oxygen   Activity Tolerance Patient limited by fatigue;No increased pain   Patient Left in chair;with call bell/phone within reach;with family/visitor present   Nurse Communication          Time: [id number] OT Time Calculation (min): 15 min  Charges: OT General Charges $OT Visit: 1 Procedure OT Treatments $Self Care/Home Management : 8-22 mins  Malka So 10/15/2014, 11:56 AM  [phone]

## 2014-10-15 NOTE — Progress Notes (Signed)
TELEMETRY: Reviewed telemetry pt in NSR: Filed Vitals:   10/15/14 0625 10/15/14 0716 10/15/14 0800 10/15/14 0828  BP: 190/80 202/66    Pulse: 66 72 72   Temp:      TempSrc:  Oral    Resp: 17 20 22    Height:      Weight:      SpO2: 98% 98% 98% 98%    Intake/Output Summary (Last 24 hours) at 10/15/14 0905 Last data filed at 10/15/14 0800  Gross per 24 hour  Intake   2810 ml  Output   1650 ml  Net   1160 ml   Filed Weights   10/13/14 0400 10/14/14 0300 10/15/14 0300  Weight: 141.84 kg (312 lb 11.2 oz) 144.244 kg (318 lb) 146.058 kg (322 lb)    Subjective Still complains of left hip pain and bilateral leg pain. No chest pain. Denies SOB but has some cough.  Marland Kitchen antiseptic oral rinse  7 mL Mouth Rinse BID  . aspirin EC  81 mg Oral Daily  . atorvastatin  40 mg Oral q1800  . carvedilol  12.5 mg Oral BID WC  . furosemide  100 mg Intravenous TID  . gabapentin  300 mg Oral BID  . hydrALAZINE  50 mg Oral 3 times per day  . insulin aspart  0-15 Units Subcutaneous TID WC  . insulin aspart  0-5 Units Subcutaneous QHS  . insulin aspart  7 Units Subcutaneous TID WC  . insulin detemir  50 Units Subcutaneous Daily  . ipratropium-albuterol  3 mL Nebulization TID  . isosorbide mononitrate  60 mg Oral Daily  . lidocaine  1 patch Transdermal Q24H  . multivitamin with minerals  1 tablet Oral Daily  . pantoprazole  40 mg Oral Daily  . sodium chloride  3 mL Intravenous Q12H  . sodium chloride  3 mL Intravenous Q12H   . heparin 1,400 Units/hr (10/14/14 2000)    LABS: Basic Metabolic Panel:  Recent Labs  10/14/14 0320 10/15/14 0324  NA 134* 137  K 4.7 4.7  CL 96* 102  CO2 28 27  GLUCOSE 316* 239*  BUN 59* 71*  CREATININE 4.14* 4.03*  CALCIUM 7.0* 6.9*   Liver Function Tests: No results for input(s): AST, ALT, ALKPHOS, BILITOT, PROT, ALBUMIN in the last 72 hours. No results for input(s): LIPASE, AMYLASE in the last 72 hours. CBC:  Recent Labs  10/14/14 0320  10/15/14 0324  WBC 12.5* 12.9*  HGB 9.3* 9.5*  HCT 30.6* 30.9*  MCV 82.7 82.2  PLT 215 229   Cardiac Enzymes:  Recent Labs  10/12/14 1053 10/12/14 1630  TROPONINI 0.58* 0.60*   BNP: No results for input(s): PROBNP in the last 72 hours. D-Dimer: No results for input(s): DDIMER in the last 72 hours. Hemoglobin A1C: No results for input(s): HGBA1C in the last 72 hours. Fasting Lipid Panel: No results for input(s): CHOL, HDL, LDLCALC, TRIG, CHOLHDL, LDLDIRECT in the last 72 hours. Thyroid Function Tests: No results for input(s): TSH, T4TOTAL, T3FREE, THYROIDAB in the last 72 hours.  Invalid input(s): FREET3   Radiology/Studies:  Dg Chest Port 1 View  10/15/2014   CLINICAL DATA:  CHF  EXAM: PORTABLE CHEST - 1 VIEW  COMPARISON:  10/12/2014  FINDINGS: Cardiomegaly with mild perihilar edema, improved. Left lung base is obscured, likely a combination of atelectasis and small pleural effusion.  IMPRESSION: Cardiomegaly with mild perihilar edema, improved.  Suspected small left pleural effusion.   Electronically Signed   By: Bertis Ruddy  [place] Pink M.D.   On: 10/15/2014 08:53   Dg Hip Unilat With Pelvis 2-3 Views Left  10/13/2014   CLINICAL DATA:  Left hip pain.  EXAM: LEFT HIP (WITH PELVIS) 2-3 VIEWS  COMPARISON:  None.  FINDINGS: The left hip is located. No acute bone or soft tissue abnormality is present. Degenerative changes are noted in the SI joints bilaterally and lower lumbar spine.  IMPRESSION: 1. Normal radiographic appearance of the left hip. 2. Degenerative changes within the lower lumbar spine and SI joints.   Electronically Signed   By: San Morelle M.D.   On: 10/13/2014 09:48   Ecg NSR with LBBB. LAD  Echo: Study Conclusions  - Left ventricle: The images without contrast are impossible to interpret. Contrast images show akinesis of the inferior septum, akinesis of the inferior wall, hypokinesis of the anterior septum, and akinesis of the anterior wall. The EF is  30%. - Mitral valve: Calcified annulus. - Right ventricle: The cavity size was normal. Systolic function was normal. - Pulmonary arteries: PA peak pressure: 57 mm Hg (S).  I have personally reviewed and interpreted this study.   PHYSICAL EXAM General: Well developed, obese, in no acute distress. Head: Normocephalic, atraumatic, sclera non-icteric, oropharynx is clear Neck: Negative for carotid bruits. JVD is elevated. No adenopathy Lungs: Clear bilaterally to auscultation without wheezes, rales, or rhonchi. Breathing is unlabored. Heart: RRR S1 S2 without murmurs, rubs, or gallops.  Abdomen: Soft, non-tender, non-distended with normoactive bowel sounds. No hepatomegaly. No rebound/guarding. No obvious abdominal masses. Msk:  Strength and tone appears normal for age. Extremities: 2+ edema with bullous lesions on legs.  Distal pedal pulses are 2+ and equal bilaterally. Neuro: Alert and oriented X 3. Moves all extremities spontaneously. Psych:  Responds to questions appropriately with a normal affect.  ASSESSMENT AND PLAN: 1. NSTEMI. Troponin elevation with flat curve suggests more of a demand type ischemia due to CHF and hypoxia. She has a LBBB of unknown duration. Echo demonstrates severe LV dysfunction with EF of 30% and regional wall motion abnormalities consistent with ischemic cardiomyopathy. These findings are new compared to Echo she had in [place] in May. She is high risk.  Since she is high risk I would favor a right and left heart cath to define coronary anatomy and define treatment options.  We will need to postpone cardiac cath until renal function has stabilized. Renal input appreciated. It appears that renal function is leveling off. If we see continued stability over the weekend then hopefully we can proceed with cardiac cath on Monday. If she progresses to needing dialysis then we could proceed with cath as well.   2. Acute on chronic systolic CHF. EF 30%. I/O positive 1447  cc.   Weigh increased. Breathing is better oxygen levels are good. CXR with left pleural effusion. Not a candidate for ACEi/ARB due to CKD. Continue Coreg. Titrate nitrates and hydralazine. Lasix resumed today per Renal.  3. CKD stage 4. Baseline creatinine 3.15 on June 8. Now rising due to acute cardiac issues with CHF.  Renal following. Resume lasix today.  4. DM poorly controlled. On insulin. Per primary team.   5. HTN- severe. Clonidine held. Will titrate Coreg, hydralazine, and nitrates for now. Diuresis may help as well.   6. Hyperlipidemia. I would recommend continued statins. Elevated AST probably due to hepatic congestion with CHF.   7. COPD. I suspect much of her breathing issues more related to CHF and obesity.   8. History of breast CA  9. Obesity.  10. Pulmonary HTN  Present on Admission:  . COPD (chronic obstructive pulmonary disease) . Hypertension . Breast cancer . Arthritis . Syncope . SOB (shortness of breath) . CKD (chronic kidney disease) . Acute on chronic respiratory failure . Chest pain with high risk for cardiac etiology . LBBB (left bundle branch block) . Elevated troponin . Acute systolic CHF (congestive heart failure) . NSTEMI (non-ST elevated myocardial infarction) . Pulmonary HTN  Signed, Twinkle Sockwell [place], [place] 10/15/2014 9:05 AM

## 2014-10-15 NOTE — Progress Notes (Signed)
PT Cancellation Note  Patient Details Name: Jordan [name] MRN: [id number] DOB: 1940/10/20   Cancelled Treatment:    Reason Eval/Treat Not Completed: Pain limiting ability to participate (pt with bil LE pain 7/10 at rest and declined mobility at this time)   Melford Aase 10/15/2014, 9:50 AM Elwyn Reach, [place]

## 2014-10-15 NOTE — Progress Notes (Signed)
  1 Stage IV CKD ( Cr3.15 on 6/17 per Dr. Eliseo Squires), with acute component, likely hemodynamically mediated 2 Hypertension, uncontrolled 3 Volume overload/CHF, weight up, pos fluid balance 4 NSTEMI  Rec: Will attempt to diurese and obtain CXR.  Not sure we will avoid dialysis this admission At risk for CIN with plans for usual preventative strategies Plans per cardiology regarding timing of cardiac intervention based upon clinical urgency  Subjective: Interval History: Increased WOB, feels bloated  Objective: Vital signs in last 24 hours: Temp:  [97.8 F (36.6 C)-98.3 F (36.8 C)] 97.8 F (36.6 C) (07/01 0300) Pulse Rate:  [57-79] 72 (07/01 0716) Resp:  [11-21] 20 (07/01 0716) BP: (157-223)/(45-80) 202/66 mmHg (07/01 0716) SpO2:  [96 %-100 %] 98 % (07/01 0716) Weight:  [146.058 kg (322 lb)] 146.058 kg (322 lb) (07/01 0300) Weight change: 1.814 kg (4 lb)  Intake/Output from previous day: 06/30 0701 - 07/01 0700 In: 3271.9 [P.O.:920; I.V.:2351.9] Out: 1825 [Urine:1825] Intake/Output this shift:    General appearance: alert and mild distress Resp: diminished breath sounds bilaterally Chest wall: no tenderness Cardio: regular rate and rhythm, S1, S2 normal, no murmur, click, rub or gallop GI: protuberant and large Extremities: edema 2-3+ WITH CHRONIC CHANGES  Lab Results:  Recent Labs  10/14/14 0320 10/15/14 0324  WBC 12.5* 12.9*  HGB 9.3* 9.5*  HCT 30.6* 30.9*  PLT 215 229   BMET:  Recent Labs  10/14/14 0320 10/15/14 0324  NA 134* 137  K 4.7 4.7  CL 96* 102  CO2 28 27  GLUCOSE 316* 239*  BUN 59* 71*  CREATININE 4.14* 4.03*  CALCIUM 7.0* 6.9*   No results for input(s): PTH in the last 72 hours. Iron Studies: No results for input(s): IRON, TIBC, TRANSFERRIN, FERRITIN in the last 72 hours. Studies/Results: Dg Hip Unilat With Pelvis 2-3 Views Left  10/13/2014   CLINICAL DATA:  Left hip pain.  EXAM: LEFT HIP (WITH PELVIS) 2-3 VIEWS  COMPARISON:  None.   FINDINGS: The left hip is located. No acute bone or soft tissue abnormality is present. Degenerative changes are noted in the SI joints bilaterally and lower lumbar spine.  IMPRESSION: 1. Normal radiographic appearance of the left hip. 2. Degenerative changes within the lower lumbar spine and SI joints.   Electronically Signed   By: San Morelle M.D.   On: 10/13/2014 09:48   Scheduled: . antiseptic oral rinse  7 mL Mouth Rinse BID  . aspirin EC  81 mg Oral Daily  . atorvastatin  40 mg Oral q1800  . carvedilol  6.25 mg Oral BID WC  . gabapentin  300 mg Oral BID  . hydrALAZINE  25 mg Oral 3 times per day  . insulin aspart  0-15 Units Subcutaneous TID WC  . insulin aspart  0-5 Units Subcutaneous QHS  . insulin aspart  7 Units Subcutaneous TID WC  . insulin detemir  50 Units Subcutaneous Daily  . ipratropium-albuterol  3 mL Nebulization TID  . isosorbide mononitrate  30 mg Oral Daily  . lidocaine  1 patch Transdermal Q24H  . multivitamin with minerals  1 tablet Oral Daily  . pantoprazole  40 mg Oral Daily  . sodium chloride  3 mL Intravenous Q12H  . sodium chloride  3 mL Intravenous Q12H     LOS: 3 days   [name] C 10/15/2014,7:37 AM

## 2014-10-16 ENCOUNTER — Inpatient Hospital Stay (HOSPITAL_COMMUNITY): Payer: Medicare (Managed Care)

## 2014-10-16 ENCOUNTER — Encounter (HOSPITAL_COMMUNITY): Payer: Self-pay | Admitting: Radiology

## 2014-10-16 DIAGNOSIS — N185 Chronic kidney disease, stage 5: Secondary | ICD-10-CM

## 2014-10-16 DIAGNOSIS — N179 Acute kidney failure, unspecified: Secondary | ICD-10-CM

## 2014-10-16 LAB — CBC
HEMATOCRIT: 30.7 % — AB (ref 36.0–46.0)
HEMOGLOBIN: 9.5 g/dL — AB (ref 12.0–15.0)
MCH: 26.1 pg (ref 26.0–34.0)
MCHC: 30.9 g/dL (ref 30.0–36.0)
MCV: 84.3 fL (ref 78.0–100.0)
PLATELETS: 208 10*3/uL (ref 150–400)
RBC: 3.64 MIL/uL — AB (ref 3.87–5.11)
RDW: 14.1 % (ref 11.5–15.5)
WBC: 9.7 10*3/uL (ref 4.0–10.5)

## 2014-10-16 LAB — GLUCOSE, CAPILLARY
Glucose-Capillary: 133 mg/dL — ABNORMAL HIGH (ref 65–99)
Glucose-Capillary: 130 mg/dL — ABNORMAL HIGH (ref 65–99)
Glucose-Capillary: 141 mg/dL — ABNORMAL HIGH (ref 65–99)
Glucose-Capillary: 136 mg/dL — ABNORMAL HIGH (ref 65–99)

## 2014-10-16 LAB — BASIC METABOLIC PANEL
Anion gap: 8 (ref 5–15)
BUN: 73 mg/dL — AB (ref 6–20)
CHLORIDE: 97 mmol/L — AB (ref 101–111)
CO2: 30 mmol/L (ref 22–32)
Calcium: 6.9 mg/dL — ABNORMAL LOW (ref 8.9–10.3)
Creatinine, Ser: 3.98 mg/dL — ABNORMAL HIGH (ref 0.44–1.00)
GFR, EST AFRICAN AMERICAN: 12 mL/min — AB (ref >60)
GFR, EST NON AFRICAN AMERICAN: 10 mL/min — AB (ref >60)
GLUCOSE: 141 mg/dL — AB (ref 65–99)
Potassium: 4.6 mmol/L (ref 3.5–5.1)
Sodium: 135 mmol/L (ref 135–145)

## 2014-10-16 LAB — HEPARIN LEVEL (UNFRACTIONATED)
HEPARIN UNFRACTIONATED: 0.28 [IU]/mL — AB (ref 0.30–0.70)
Heparin Unfractionated: 0.18 IU/mL — ABNORMAL LOW (ref 0.30–0.70)

## 2014-10-16 MED ORDER — FUROSEMIDE 10 MG/ML IJ SOLN
80.0000 mg | Freq: Three times a day (TID) | INTRAMUSCULAR | Status: DC
  Administered 2014-10-16 – 2014-10-17 (×3): 80 mg via INTRAVENOUS
  Filled 2014-10-16 (×6): qty 8

## 2014-10-16 MED ORDER — HEPARIN BOLUS VIA INFUSION
2000.0000 [IU] | Freq: Once | INTRAVENOUS | Status: AC
  Administered 2014-10-16: 2000 [IU] via INTRAVENOUS
  Filled 2014-10-16: qty 2000

## 2014-10-16 NOTE — Progress Notes (Signed)
Patient Name: Jordan [name] Date of Encounter: 10/16/2014  Principal Problem:   SOB (shortness of breath) Active Problems:   Breast cancer   Diabetes mellitus without complication   Hypertension   Arthritis   COPD (chronic obstructive pulmonary disease)   Syncope   CKD (chronic kidney disease)   Acute on chronic respiratory failure   Chest pain with high risk for cardiac etiology   LBBB (left bundle branch block)   Elevated troponin   Acute systolic CHF (congestive heart failure)   NSTEMI (non-ST elevated myocardial infarction)   Pulmonary HTN   AKI (acute kidney injury)   Length of Stay: 4  SUBJECTIVE  Substantially improved from yesterday, no chest pressure, improved orthopnea. Mild "plateau" elevation in troponin. BP better. Slight improvement in renal parameters yesterday, creat 4.0. No BMET today  CURRENT MEDS . antiseptic oral rinse  7 mL Mouth Rinse BID  . aspirin EC  81 mg Oral Daily  . atorvastatin  40 mg Oral q1800  . carvedilol  12.5 mg Oral BID WC  . furosemide  100 mg Intravenous TID  . gabapentin  300 mg Oral BID  . hydrALAZINE  50 mg Oral 3 times per day  . insulin aspart  0-15 Units Subcutaneous TID WC  . insulin aspart  0-5 Units Subcutaneous QHS  . insulin aspart  7 Units Subcutaneous TID WC  . insulin detemir  50 Units Subcutaneous Daily  . ipratropium-albuterol  3 mL Nebulization TID  . isosorbide mononitrate  60 mg Oral Daily  . lidocaine  1 patch Transdermal Q24H  . multivitamin with minerals  1 tablet Oral Daily  . pantoprazole  40 mg Oral Daily  . sodium chloride  3 mL Intravenous Q12H  . sodium chloride  3 mL Intravenous Q12H    OBJECTIVE   Intake/Output Summary (Last 24 hours) at 10/16/14 0925 Last data filed at 10/16/14 0600  Gross per 24 hour  Intake  734.1 ml  Output   2550 ml  Net -1815.9 ml   Filed Weights   10/14/14 0300 10/15/14 0300 10/16/14 0500  Weight: 318 lb (144.244 kg) 322 lb (146.058 kg) 320 lb 5.3 oz  (145.3 kg)    PHYSICAL EXAM Filed Vitals:   10/16/14 0041 10/16/14 0418 10/16/14 0500 10/16/14 0830  BP: 132/68 152/50  96/54  Pulse: 70 75  82  Temp: 97.3 F (36.3 C) 98.4 F (36.9 C)  98.8 F (37.1 C)  TempSrc: Oral Oral  Oral  Resp: 18 17  16   Height:      Weight:   320 lb 5.3 oz (145.3 kg)   SpO2: 98% 98%  96%   General: Alert, oriented x3, no distress; morbid obesity limits the exam Head: no evidence of trauma, PERRL, EOMI, no exophtalmos or lid lag, no myxedema, no xanthelasma; normal ears, nose and oropharynx Neck: hard to evaluate the jugular venous pulsations and no hepatojugular reflux; brisk carotid pulses without delay and no carotid bruits Chest: clear to auscultation, no signs of consolidation by percussion or palpation, normal fremitus, symmetrical and full respiratory excursions Cardiovascular: unable to locate the apical impulse, regular rhythm, normal first and second heart sounds, no rubs or gallops, no murmur Abdomen: no tenderness or distention, no masses by palpation, no abnormal pulsatility or arterial bruits, normal bowel sounds, no hepatosplenomegaly Extremities: no clubbing, cyanosis; lymphedema left upper extremity; 3-4+ chronic brawny edema of both legs, to level of knees, R>L; 2+ radial, ulnar and brachial pulses bilaterally; no subclavian bruits. Unable  to evaluate lower extremity pulses Neurological: grossly nonfocal  LABS  CBC  Recent Labs  10/15/14 0324 10/16/14 0225  WBC 12.9* 9.7  HGB 9.5* 9.5*  HCT 30.9* 30.7*  MCV 82.2 84.3  PLT 229 [id number]   Basic Metabolic Panel  Recent Labs  10/14/14 0320 10/15/14 0324  NA 134* 137  K 4.7 4.7  CL 96* 102  CO2 28 27  GLUCOSE 316* 239*  BUN 59* 71*  CREATININE 4.14* 4.03*  CALCIUM 7.0* 6.9*    Imaging results have been reviewed and Dg Chest Port 1 View  10/15/2014   CLINICAL DATA:  CHF  EXAM: PORTABLE CHEST - 1 VIEW  COMPARISON:  10/12/2014  FINDINGS: Cardiomegaly with mild perihilar edema,  improved. Left lung base is obscured, likely a combination of atelectasis and small pleural effusion.  IMPRESSION: Cardiomegaly with mild perihilar edema, improved.  Suspected small left pleural effusion.   Electronically Signed   By: Julian Hy M.D.   On: 10/15/2014 08:53    TELE NSR  ECG NSR, LBBB  ASSESSMENT AND PLAN  1. NSTEMI. Troponin elevation with flat curve suggests more of a demand type ischemia due to CHF and hypoxia. She has a LBBB of unknown duration. Echo demonstrates severe LV dysfunction with EF of 30% and regional wall motion abnormalities consistent with ischemic cardiomyopathy. These findings are new compared to Echo she had in [place] in May. She is high risk.   Plan right and left heart cath to define coronary anatomy and define treatment options, no sooner than Monday if renal function allows. If she progresses to dialysis then we could proceed with cath as well.   2. Acute on chronic systolic CHF. EF 30%. I/O negative 1.5L in last 24 h, almost neutral from admission. Weight recording probably inaccurate on first two hospital days. Not a candidate for ACEi/ARB due to CKD. Continue Coreg, nitrates and hydralazine. Diuretics per Renal.  3. CKD stage 4. Baseline creatinine 3.15 on June 8. Waiting for labs today  4. DM poorly controlled. On insulin. Per primary team.   5. HTN- severe, improved control. Will titrate Coreg, hydralazine, and nitrates for now. Diuresis may help as well.   6. Hyperlipidemia - continue statins. Elevated AST probably due to hepatic congestion with CHF.   7. COPD. Agree much of her breathing issues more related to CHF and obesity , "cardiac asthma".   8. History of breast CA  With right arm lymphedema  9. Super Obesity.  10. Pulmonary HTN - suspect major component of OSA/obesity-hypoventilation, but also left heart failure  11. LBBB - CRT may be an option down the road, if she fails medical management. Obesity increases complexity of  device implantation and increases risk of complications.  Sanda Klein, MD, [facility] [phone] office [phone] pager 10/16/2014 9:25 AM

## 2014-10-16 NOTE — Progress Notes (Signed)
[facility]                                                                              Patient Demographics  Jordan [name], is a 74 y.o. female, DOB - 1940-10-19, [id number]  Admit date - 10/12/2014   Admitting Physician Ivor Costa, MD  Outpatient Primary MD for the patient is Pcp Not In System  LOS - 4   Chief Complaint  Patient presents with  . Respiratory Distress       Brief HPI   Jordan [name] is a 74 y.o. female with PMH of hypertension, diabetes mellitus, GERD, breast cancer, COPD, arthritis, chronic kidney disease (unknown stage), chronic leg edema, who presents with shortness of breath and syncope. Patient comes here from [place] for visiting her daughter. She had been doing fine until 11:30 on night of admission when she started having SOB. She does not have cough and chest pain. Per her daughter, patient did some exertion, then she passed out for few seconds. She did not have seizure. No head injury. Patient reports that she is taking Bumex for chronic leg edema, but denies congestive heart failure. She states that her leg swelling has been worsening recently.  Patient was found to have NSTEMI and is in need of a cath. Cath has been postponed while her kidney function is optimized.   Assessment & Plan    Principal Problem: Acute on chronic hypoxic respiratory failure likely due to NSTEMI and acute on chronic systolic CHF - Progressively improving  Active problems:  Acute on chronic systolic CHF exacerbation: Patient has left bundle branch block of unknown duration, troponin elevation - Echo from 5/16 in [place] shows 55-60%- no wall abnormalities - 2-D echo 6/28 showed EF of 30% with akinesis of inferior septum, akinesis of inferior wall, hypokinesis of anterior septum and akinesis of anterior wall - Not a candidate for ACEI/ARB due to chronic kidney disease - Continue Coreg, nitrates, hydralazine - Renal service also following for  diuresis, negative balance of 1.4 L, on Lasix 80 mg IV 3 times a day  NSTEMI - 2-D echo 6/28 showed EF of 30% with akinesis of inferior septum, akinesis of inferior wall, hypokinesis of anterior septum and akinesis of anterior wall - Cardiology planning right and left heart cath if the renal function allows, continue heparin drip, Coreg, statin  COPD:  - Does not seem to have acute exacerbation- wheezing was most likely fluid - Improving, continued on abscess, diuresis   DM-II: Insulin-dependent -Continue Levemir, sliding scale insulin and meal coverage, fluctuating  - Hemoglobin A1c: 9.4  Hx of Breast cancer: s/p of L mastectomy, chemotherapy and radiation therapy. Patient has been followed up with oncologist. Last seen was 2 months ago, was told that her breast cancer was stable. -Follow-up with her oncologist in [place].  Hypertension: -Continue  Coreg, nitrates  GERD: -Continue PPI   AKI on CKD (chronic kidney disease): baseline appears to be: 3 1/16 -Nephrology following, urine output improving, has underlying stage IV COPD - US-renal- medical renal disease - high dose lasix resumed-- may need dialysis,   Hip pain: Continues to complain of left hip  pain, has history of osteoarthritis  -hip x ray does not show fracture -Obtain  CT scan for occult fracture rule out  obesity -encourage weight loss, diet control  Code Status: full code  Family Communication: Discussed in detail with the patient, all imaging results, lab results explained to the patient    Disposition Plan: Remain in stepdown unit  Time Spent in minutes   25 minutes  Procedures  2d echo Left hip xray Renal US  Consults   Cardiology  Nephrology  DVT Prophylaxis  heparin drip  Medications  Scheduled Meds: . antiseptic oral rinse  7 mL Mouth Rinse BID  . aspirin EC  81 mg Oral Daily  . atorvastatin  40 mg Oral q1800  . carvedilol  12.5 mg Oral BID WC  . furosemide  80 mg Intravenous TID  .  gabapentin  300 mg Oral BID  . hydrALAZINE  50 mg Oral 3 times per day  . insulin aspart  0-15 Units Subcutaneous TID WC  . insulin aspart  0-5 Units Subcutaneous QHS  . insulin aspart  7 Units Subcutaneous TID WC  . insulin detemir  50 Units Subcutaneous Daily  . ipratropium-albuterol  3 mL Nebulization TID  . isosorbide mononitrate  60 mg Oral Daily  . lidocaine  1 patch Transdermal Q24H  . multivitamin with minerals  1 tablet Oral Daily  . pantoprazole  40 mg Oral Daily  . sodium chloride  3 mL Intravenous Q12H  . sodium chloride  3 mL Intravenous Q12H   Continuous Infusions: . heparin 1,700 Units/hr (10/16/14 0830)   PRN Meds:.sodium chloride, sodium chloride, acetaminophen, albuterol, capsaicin, guaiFENesin-codeine, HYDROcodone-acetaminophen, ondansetron (ZOFRAN) IV, sodium chloride, sodium chloride   Antibiotics   Anti-infectives    None        Subjective:   Jordan [name] was seen and examined today. Patient denies dizziness, chest pain, shortness of breath, abdominal pain, N/V/D/C, new weakness, numbess, tingling.Complaining of hip pain bothering her.  Objective:   Blood pressure 96/54, pulse 82, temperature 98.8 F (37.1 C), temperature source Oral, resp. rate 16, height 5\' 5"  (1.651 m), weight 145.3 kg (320 lb 5.3 oz), SpO2 96 %.  Wt Readings from Last 3 Encounters:  10/16/14 145.3 kg (320 lb 5.3 oz)     Intake/Output Summary (Last 24 hours) at 10/16/14 1045 Last data filed at 10/16/14 0926  Gross per 24 hour  Intake  607.6 ml  Output   3450 ml  Net -2842.4 ml    Exam  General: Alert and oriented x 3, NAD  HEENT:  PERRLA, EOMI, Anicteric Sclera, mucous membranes moist.   Neck: Supple, no JVD, no masses  CVS: S1 S2 auscultated, no rubs, murmurs or gallops. Regular rate and rhythm.  Respiratory:Bibasilar crackles   Abdomen:  obese, Soft, nontender, nondistended, + bowel sounds  Ext: no cyanosis clubbing, 3+ edemaBilateral lower extremities    Neuro: AAOx3, Cr N's II- XII. Strength 5/5 upper and lower extremities bilaterally  Skin: No rashes  Psych: Normal affect and demeanor, alert and oriented x3    Data Review   Micro Results Recent Results (from the past 240 hour(s))  MRSA PCR Screening     Status: None   Collection Time: 10/12/14  6:42 AM  Result Value Ref Range Status   MRSA by PCR NEGATIVE NEGATIVE Final    Comment:        The GeneXpert MRSA Assay (FDA approved for NASAL specimens only), is one component of a comprehensive MRSA colonization surveillance  program. It is not intended to diagnose MRSA infection nor to guide or monitor treatment for MRSA infections.     Radiology Reports US Renal  10/12/2014   CLINICAL DATA:  Acute renal insufficiency  EXAM: RENAL / URINARY TRACT ULTRASOUND COMPLETE  COMPARISON:  None.  FINDINGS: Right Kidney:  Length: 11.7 cm.  Mildly increased echogenicity.  No hydronephrosis.  Left Kidney:  Length: 12.1 cm.  Mildly increased echogenicity.  No hydronephrosis.  Bladder:  Appears normal for degree of bladder distention.  IMPRESSION: Negative for hydronephrosis. There is mildly increased renal parenchymal echogenicity consistent with medical renal disease.   Electronically Signed   By: Andreas Newport M.D.   On: 10/12/2014 06:29   Dg Chest Port 1 View  10/15/2014   CLINICAL DATA:  CHF  EXAM: PORTABLE CHEST - 1 VIEW  COMPARISON:  10/12/2014  FINDINGS: Cardiomegaly with mild perihilar edema, improved. Left lung base is obscured, likely a combination of atelectasis and small pleural effusion.  IMPRESSION: Cardiomegaly with mild perihilar edema, improved.  Suspected small left pleural effusion.   Electronically Signed   By: Julian Hy M.D.   On: 10/15/2014 08:53   Dg Chest Portable 1 View  10/12/2014   CLINICAL DATA:  Respiratory distress  EXAM: PORTABLE CHEST - 1 VIEW  COMPARISON:  None.  FINDINGS: Central and basilar airspace opacities are present bilaterally. There is  cardiomegaly. There probably is a small left pleural effusion.  IMPRESSION: Extensive airspace opacities and central and basilar distribution. This may represent alveolar edema associated with congestive heart failure. Probable left effusion. Cannot exclude infectious infiltrates.   Electronically Signed   By: Andreas Newport M.D.   On: 10/12/2014 01:51   Dg Hip Unilat With Pelvis 2-3 Views Left  10/13/2014   CLINICAL DATA:  Left hip pain.  EXAM: LEFT HIP (WITH PELVIS) 2-3 VIEWS  COMPARISON:  None.  FINDINGS: The left hip is located. No acute bone or soft tissue abnormality is present. Degenerative changes are noted in the SI joints bilaterally and lower lumbar spine.  IMPRESSION: 1. Normal radiographic appearance of the left hip. 2. Degenerative changes within the lower lumbar spine and SI joints.   Electronically Signed   By: [place] M.D.   On: 10/13/2014 09:48    CBC  Recent Labs Lab 10/12/14 0100 10/13/14 0905 10/14/14 0320 10/15/14 0324 10/16/14 0225  WBC 8.0 14.5* 12.5* 12.9* 9.7  HGB 11.1* 10.2* 9.3* 9.5* 9.5*  HCT 36.0 32.9* 30.6* 30.9* 30.7*  PLT 278 220 215 229 208  MCV 83.1 83.3 82.7 82.2 84.3  MCH 25.6* 25.8* 25.1* 25.3* 26.1  MCHC 30.8 31.0 30.4 30.7 30.9  RDW 13.4 13.6 13.6 13.8 14.1  LYMPHSABS 2.4  --   --   --   --   MONOABS 0.5  --   --   --   --   EOSABS 0.1  --   --   --   --   BASOSABS 0.0  --   --   --   --     Chemistries   Recent Labs Lab 10/12/14 0100 10/13/14 0905 10/14/14 0320 10/15/14 0324 10/16/14 0845  NA 137 136 134* 137 135  K 4.3 4.8 4.7 4.7 4.6  CL 103 99* 96* 102 97*  CO2 20* 28 28 27 30   GLUCOSE 352* 300* 316* 239* 141*  BUN 42* 53* 59* 71* 73*  CREATININE 3.60* 3.90* 4.14* 4.03* 3.98*  CALCIUM 7.2* 7.4* 7.0* 6.9* 6.9*  AST 59*  --   --   --   --  ALT 37  --   --   --   --   ALKPHOS 101  --   --   --   --   BILITOT 0.2*  --   --   --   --     ------------------------------------------------------------------------------------------------------------------ estimated creatinine clearance is 18.9 mL/min (by C-G formula based on Cr of 3.98). ------------------------------------------------------------------------------------------------------------------ No results for input(s): HGBA1C in the last 72 hours. ------------------------------------------------------------------------------------------------------------------ No results for input(s): CHOL, HDL, LDLCALC, TRIG, CHOLHDL, LDLDIRECT in the last 72 hours. ------------------------------------------------------------------------------------------------------------------ No results for input(s): TSH, T4TOTAL, T3FREE, THYROIDAB in the last 72 hours.  Invalid input(s): FREET3 ------------------------------------------------------------------------------------------------------------------ No results for input(s): VITAMINB12, FOLATE, FERRITIN, TIBC, IRON, RETICCTPCT in the last 72 hours.  Coagulation profile  Recent Labs Lab 10/12/14 0745 10/15/14 0324  INR 1.06 1.09    No results for input(s): DDIMER in the last 72 hours.  Cardiac Enzymes  Recent Labs Lab 10/12/14 0745 10/12/14 1053 10/12/14 1630  TROPONINI 0.51* 0.58* 0.60*   ------------------------------------------------------------------------------------------------------------------ Invalid input(s): POCBNP   Recent Labs  10/14/14 2125 10/15/14 0726 10/15/14 1225 10/15/14 1708 10/15/14 2151 10/16/14 0827  GLUCAP 309* 253* 186* 191* 48* 133*     RAI,RIPUDEEP M.D. [facility] 10/16/2014, 10:45 AM  Pager: [phone]   Between 7am to 7pm - call Pager - [phone]  After 7pm go to [email or web address] - password [email or web address]  Call night coverage person covering after 7pm

## 2014-10-16 NOTE — Progress Notes (Signed)
ANTICOAGULATION CONSULT NOTE - Follow Up Consult  Pharmacy Consult for heparin Indication: NSTEMI   Labs:  Recent Labs  10/13/14 0905  10/14/14 0320 10/15/14 0324 10/16/14 0225  HGB 10.2*  --  9.3* 9.5* 9.5*  HCT 32.9*  --  30.6* 30.9* 30.7*  PLT 220  --  215 229 208  LABPROT  --   --   --  14.3  --   INR  --   --   --  1.09  --   HEPARINUNFRC 0.59  < > 0.48 0.35 0.18*  CREATININE 3.90*  --  4.14* 4.03*  --   < > = values in this interval not displayed.   Assessment: 74yo female now subtherapeutic on heparin after several levels at goal though had been trending down, plan to cath when renal function stabilizes.  Goal of Therapy:  Heparin level 0.3-0.7 units/ml   Plan:  Will rebolus with heparin 2000 units and increase gtt by 3 units/kgABW/hr to 1700 units/hr and check level in [place], PharmD, BCPS  10/16/2014,3:28 AM

## 2014-10-16 NOTE — Progress Notes (Signed)
[facility] for heparin Indication: chest pain/ACS  No Known Allergies  Patient Measurements: Height: 5\' 5"  (165.1 cm) Weight: (!) 320 lb 5.3 oz (145.3 kg) IBW/kg (Calculated) : 57 Heparin Dosing Weight: 89kg  Vital Signs: Temp: 98.8 F (37.1 C) (07/02 0830) Temp Source: Oral (07/02 0830) BP: 174/62 mmHg (07/02 1200) Pulse Rate: 75 (07/02 1200)  Labs:  Recent Labs  10/14/14 0320 10/15/14 0324 10/16/14 0225 10/16/14 0845 10/16/14 1119  HGB 9.3* 9.5* 9.5*  --   --   HCT 30.6* 30.9* 30.7*  --   --   PLT 215 229 208  --   --   LABPROT  --  14.3  --   --   --   INR  --  1.09  --   --   --   HEPARINUNFRC 0.48 0.35 0.18*  --  0.28*  CREATININE 4.14* 4.03*  --  3.98*  --     Estimated Creatinine Clearance: 18.9 mL/min (by C-G formula based on Cr of 3.98).  Assessment: 74 yo female with here with SOB/elevated cardiac markers and noted with NSTEMI possible cath next week. Pharmacy is dosing heparin -Heparin level=  0.28 slightly less than goal drip rate 1700 uts/hr.  No bleeding noted, CBC stable.      Goal of Therapy:  Heparin level 0.3-0.7 units/ml Monitor platelets by anticoagulation protocol: Yes   Plan:  -increase heparin 1750 uts/hr -Will follow cath plans -Daily heparin level and CBC  Bonnita Nasuti Pharm.D. CPP, BCPS Clinical Pharmacist [phone] 10/16/2014 1:10 PM

## 2014-10-16 NOTE — Progress Notes (Signed)
1 Stage IV CKD ( Cr3.15 on 6/17 per Dr. Eliseo Squires), with acute component, likely hemodynamically mediated and stabilized 2 Hypertension, variable control 3 Volume overload/CHF, weight up, pos fluid balance 4 NSTEMI  Rec: With improved UOP will decrease Furosemide dosage At risk for CIN with plans for usual preventative strategies Plans per cardiology regarding timing of cardiac intervention based upon clinical urgency  Subjective: Interval History: Improved UOP  Objective: Vital signs in last 24 hours: Temp:  [97.3 F (36.3 C)-98.8 F (37.1 C)] 98.8 F (37.1 C) (07/02 0830) Pulse Rate:  [66-82] 82 (07/02 0830) Resp:  [16-22] 16 (07/02 0830) BP: (96-158)/(33-68) 96/54 mmHg (07/02 0830) SpO2:  [95 %-100 %] 96 % (07/02 0955) Weight:  [145.3 kg (320 lb 5.3 oz)] 145.3 kg (320 lb 5.3 oz) (07/02 0500) Weight change: -0.758 kg (-1 lb 10.8 oz)  Intake/Output from previous day: 07/01 0701 - 07/02 0700 In: 1157.1 [P.O.:520; I.V.:517.1; IV Piggyback:120] Out: 2725 [Urine:2725] Intake/Output this shift: Total I/O In: 60 [IV Piggyback:60] Out: 900 [Urine:900]  General appearance: alert and cooperative Chest wall: no tenderness Cardio: regular rate and rhythm, S1, S2 normal, no murmur, click, rub or gallop Extremities: edema 2-3+  Lab Results:  Recent Labs  10/15/14 0324 10/16/14 0225  WBC 12.9* 9.7  HGB 9.5* 9.5*  HCT 30.9* 30.7*  PLT 229 208   BMET:  Recent Labs  10/15/14 0324 10/16/14 0845  NA 137 135  K 4.7 4.6  CL 102 97*  CO2 27 30  GLUCOSE 239* 141*  BUN 71* 73*  CREATININE 4.03* 3.98*  CALCIUM 6.9* 6.9*   No results for input(s): PTH in the last 72 hours. Iron Studies: No results for input(s): IRON, TIBC, TRANSFERRIN, FERRITIN in the last 72 hours. Studies/Results: Dg Chest Port 1 View  10/15/2014   CLINICAL DATA:  CHF  EXAM: PORTABLE CHEST - 1 VIEW  COMPARISON:  10/12/2014  FINDINGS: Cardiomegaly with mild perihilar edema, improved. Left lung base is obscured,  likely a combination of atelectasis and small pleural effusion.  IMPRESSION: Cardiomegaly with mild perihilar edema, improved.  Suspected small left pleural effusion.   Electronically Signed   By: Julian Hy M.D.   On: 10/15/2014 08:53   Scheduled: . antiseptic oral rinse  7 mL Mouth Rinse BID  . aspirin EC  81 mg Oral Daily  . atorvastatin  40 mg Oral q1800  . carvedilol  12.5 mg Oral BID WC  . furosemide  100 mg Intravenous TID  . gabapentin  300 mg Oral BID  . hydrALAZINE  50 mg Oral 3 times per day  . insulin aspart  0-15 Units Subcutaneous TID WC  . insulin aspart  0-5 Units Subcutaneous QHS  . insulin aspart  7 Units Subcutaneous TID WC  . insulin detemir  50 Units Subcutaneous Daily  . ipratropium-albuterol  3 mL Nebulization TID  . isosorbide mononitrate  60 mg Oral Daily  . lidocaine  1 patch Transdermal Q24H  . multivitamin with minerals  1 tablet Oral Daily  . pantoprazole  40 mg Oral Daily  . sodium chloride  3 mL Intravenous Q12H  . sodium chloride  3 mL Intravenous Q12H     LOS: 4 days   Jasir Rother C 10/16/2014,10:22 AM

## 2014-10-17 ENCOUNTER — Inpatient Hospital Stay (HOSPITAL_COMMUNITY): Payer: Medicare (Managed Care)

## 2014-10-17 DIAGNOSIS — R52 Pain, unspecified: Secondary | ICD-10-CM

## 2014-10-17 LAB — CBC
HEMATOCRIT: 29.7 % — AB (ref 36.0–46.0)
HEMOGLOBIN: 9.2 g/dL — AB (ref 12.0–15.0)
MCH: 25.5 pg — ABNORMAL LOW (ref 26.0–34.0)
MCHC: 31 g/dL (ref 30.0–36.0)
MCV: 82.3 fL (ref 78.0–100.0)
PLATELETS: 222 10*3/uL (ref 150–400)
RBC: 3.61 MIL/uL — ABNORMAL LOW (ref 3.87–5.11)
RDW: 13.9 % (ref 11.5–15.5)
WBC: 9.5 10*3/uL (ref 4.0–10.5)

## 2014-10-17 LAB — BASIC METABOLIC PANEL
ANION GAP: 10 (ref 5–15)
BUN: 72 mg/dL — AB (ref 6–20)
CO2: 31 mmol/L (ref 22–32)
Calcium: 7.2 mg/dL — ABNORMAL LOW (ref 8.9–10.3)
Chloride: 96 mmol/L — ABNORMAL LOW (ref 101–111)
Creatinine, Ser: 3.99 mg/dL — ABNORMAL HIGH (ref 0.44–1.00)
GFR calc Af Amer: 12 mL/min — ABNORMAL LOW (ref >60)
GFR calc non Af Amer: 10 mL/min — ABNORMAL LOW (ref >60)
GLUCOSE: 232 mg/dL — AB (ref 65–99)
Potassium: 4.7 mmol/L (ref 3.5–5.1)
Sodium: 137 mmol/L (ref 135–145)

## 2014-10-17 LAB — GLUCOSE, CAPILLARY
GLUCOSE-CAPILLARY: 210 mg/dL — AB (ref 65–99)
GLUCOSE-CAPILLARY: 175 mg/dL — AB (ref 65–99)
Glucose-Capillary: 246 mg/dL — ABNORMAL HIGH (ref 65–99)
Glucose-Capillary: 146 mg/dL — ABNORMAL HIGH (ref 65–99)

## 2014-10-17 LAB — HEPARIN LEVEL (UNFRACTIONATED)
HEPARIN UNFRACTIONATED: 0.21 [IU]/mL — AB (ref 0.30–0.70)
Heparin Unfractionated: 0.37 IU/mL (ref 0.30–0.70)

## 2014-10-17 MED ORDER — POLYETHYLENE GLYCOL 3350 17 G PO PACK
17.0000 g | PACK | Freq: Every day | ORAL | Status: DC | PRN
  Filled 2014-10-17: qty 1

## 2014-10-17 MED ORDER — FUROSEMIDE 10 MG/ML IJ SOLN
40.0000 mg | Freq: Three times a day (TID) | INTRAMUSCULAR | Status: DC
  Administered 2014-10-17 (×2): 40 mg via INTRAVENOUS
  Filled 2014-10-17 (×3): qty 4

## 2014-10-17 MED ORDER — CARVEDILOL 25 MG PO TABS
25.0000 mg | ORAL_TABLET | Freq: Two times a day (BID) | ORAL | Status: DC
  Administered 2014-10-17 – 2014-10-24 (×14): 25 mg via ORAL
  Filled 2014-10-17 (×19): qty 1

## 2014-10-17 MED ORDER — HYDRALAZINE HCL 50 MG PO TABS
100.0000 mg | ORAL_TABLET | Freq: Three times a day (TID) | ORAL | Status: DC
  Administered 2014-10-17 – 2014-10-24 (×19): 100 mg via ORAL
  Filled 2014-10-17 (×27): qty 2

## 2014-10-17 MED ORDER — INSULIN ASPART 100 UNIT/ML ~~LOC~~ SOLN
10.0000 [IU] | Freq: Three times a day (TID) | SUBCUTANEOUS | Status: DC
  Administered 2014-10-17 – 2014-10-27 (×23): 10 [IU] via SUBCUTANEOUS

## 2014-10-17 MED ORDER — GABAPENTIN 300 MG PO CAPS
300.0000 mg | ORAL_CAPSULE | Freq: Three times a day (TID) | ORAL | Status: DC
  Administered 2014-10-17 – 2014-10-19 (×8): 300 mg via ORAL
  Filled 2014-10-17 (×11): qty 1

## 2014-10-17 MED ORDER — INSULIN DETEMIR 100 UNIT/ML ~~LOC~~ SOLN
60.0000 [IU] | Freq: Every day | SUBCUTANEOUS | Status: DC
Start: 2014-10-18 — End: 2014-10-19
  Administered 2014-10-18: 60 [IU] via SUBCUTANEOUS
  Filled 2014-10-17 (×2): qty 0.6

## 2014-10-17 MED ORDER — ISOSORBIDE MONONITRATE ER 60 MG PO TB24
120.0000 mg | ORAL_TABLET | Freq: Every day | ORAL | Status: DC
  Administered 2014-10-17 – 2014-10-24 (×7): 120 mg via ORAL
  Filled 2014-10-17 (×9): qty 2

## 2014-10-17 MED ORDER — INSULIN DETEMIR 100 UNIT/ML ~~LOC~~ SOLN: 70.0000 [IU] | Freq: Every day | SUBCUTANEOUS | Status: DC

## 2014-10-17 MED ORDER — OXYCODONE HCL 5 MG PO TABS
5.0000 mg | ORAL_TABLET | ORAL | Status: DC | PRN
  Administered 2014-10-19 – 2014-10-23 (×3): 5 mg via ORAL
  Filled 2014-10-17 (×3): qty 1

## 2014-10-17 MED ORDER — IPRATROPIUM-ALBUTEROL 0.5-2.5 (3) MG/3ML IN SOLN: 3.0000 mL | Freq: Four times a day (QID) | RESPIRATORY_TRACT | Status: DC | PRN

## 2014-10-17 MED ORDER — DOCUSATE SODIUM 100 MG PO CAPS
100.0000 mg | ORAL_CAPSULE | Freq: Two times a day (BID) | ORAL | Status: DC
  Administered 2014-10-17 – 2014-10-27 (×7): 100 mg via ORAL
  Filled 2014-10-17 (×22): qty 1

## 2014-10-17 MED ORDER — LABETALOL HCL 5 MG/ML IV SOLN
20.0000 mg | Freq: Once | INTRAVENOUS | Status: AC
  Administered 2014-10-17: 20 mg via INTRAVENOUS
  Filled 2014-10-17: qty 4

## 2014-10-17 NOTE — Progress Notes (Signed)
[facility] for heparin Indication: chest pain/ACS  No Known Allergies  Patient Measurements: Height: 5\' 5"  (165.1 cm) Weight: (!) 318 lb 6.4 oz (144.425 kg) IBW/kg (Calculated) : 57 Heparin Dosing Weight: 89kg  Vital Signs: Temp: 98.2 F (36.8 C) (07/03 1123) Temp Source: Oral (07/03 1123) BP: 119/41 mmHg (07/03 1325) Pulse Rate: 70 (07/03 1325)  Labs:  Recent Labs  10/15/14 0324 10/16/14 0225 10/16/14 0845 10/16/14 1119 10/17/14 0250 10/17/14 1242  HGB 9.5* 9.5*  --   --  9.2*  --   HCT 30.9* 30.7*  --   --  29.7*  --   PLT 229 208  --   --  222  --   LABPROT 14.3  --   --   --   --   --   INR 1.09  --   --   --   --   --   HEPARINUNFRC 0.35 0.18*  --  0.28* 0.21* 0.37  CREATININE 4.03*  --  3.98*  --  3.99*  --     Estimated Creatinine Clearance: 18.8 mL/min (by C-G formula based on Cr of 3.99).  Assessment: 74 yo female with here with SOB/elevated cardiac markers and noted with NSTEMI probable cath next week. Pharmacy is dosing heparin -Heparin level=  0.37 at goal drip rate 1900 uts/hr.  No bleeding noted, CBC stable.      Goal of Therapy:  Heparin level 0.3-0.7 units/ml Monitor platelets by anticoagulation protocol: Yes   Plan:  - Continue heparin 1900 uts/hr -Will follow cath plans -Daily heparin level and CBC  Bonnita Nasuti Pharm.D. CPP, BCPS Clinical Pharmacist [phone] 10/17/2014 2:02 PM

## 2014-10-17 NOTE — Progress Notes (Signed)
[place] for heparin Indication: chest pain/ACS  No Known Allergies  Patient Measurements: Height: 5\' 5"  (165.1 cm) Weight: (!) 320 lb 5.3 oz (145.3 kg) IBW/kg (Calculated) : 57 Heparin Dosing Weight: 89kg  Vital Signs: Temp: 98 F (36.7 C) (07/02 2351) Temp Source: Oral (07/02 2351) BP: 183/61 mmHg (07/03 0400) Pulse Rate: 84 (07/03 0400)  Labs:  Recent Labs  10/15/14 0324 10/16/14 0225 10/16/14 0845 10/16/14 1119 10/17/14 0250  HGB 9.5* 9.5*  --   --  9.2*  HCT 30.9* 30.7*  --   --  29.7*  PLT 229 208  --   --  222  LABPROT 14.3  --   --   --   --   INR 1.09  --   --   --   --   HEPARINUNFRC 0.35 0.18*  --  0.28* 0.21*  CREATININE 4.03*  --  3.98*  --   --     Estimated Creatinine Clearance: 18.9 mL/min (by C-G formula based on Cr of 3.98).  Assessment: 74 yo female with here with SOB/elevated cardiac markers and noted with NSTEMI possible cath next week. Pharmacy is dosing heparin. Heparin level 0.21 (subtherapeutic) - actually down since small rate increase yesterday. CBC stable. No bleeding noted.  Goal of Therapy:  Heparin level 0.3-0.7 units/ml Monitor platelets by anticoagulation protocol: Yes   Plan:  -Increase heparin to 1900 units/hr -F/u 8 hour heparin level -Will follow cath plans -Daily heparin level and CBC  Sherlon Handing, PharmD, BCPS Clinical pharmacist, pager [phone] 10/17/2014 4:46 AM

## 2014-10-17 NOTE — Progress Notes (Signed)
Assessment: 1 Stage IV CKD ( Cr3.15 on 6/17 per Dr. Eliseo Squires), with acute component, likely hemodynamically mediated and stabilized 2 Hypertension, variable control 3 Volume overload/CHF / anasarca LE's 4 NSTEMI 5 Ischemic CM w wall motion abnormalities  Rec: Diuretics on hold per cardiology in prep for heart cath on Tuesday. Nothing further to offer at this time, will sign off,k please call as needed.    Subjective: Interval History: Improved UOP  Objective: Vital signs in last 24 hours: Temp:  [97.9 F (36.6 C)-98.3 F (36.8 C)] 98.2 F (36.8 C) (07/03 1123) Pulse Rate:  [66-96] 66 (07/03 1123) Resp:  [11-22] 18 (07/03 1123) BP: (133-199)/(39-74) 180/53 mmHg (07/03 1123) SpO2:  [94 %-99 %] 99 % (07/03 1123) Weight:  [144.425 kg (318 lb 6.4 oz)] 144.425 kg (318 lb 6.4 oz) (07/03 0500) Weight change: -0.875 kg (-1 lb 14.9 oz)  Intake/Output from previous day: 07/02 0701 - 07/03 0700 In: 274.5 [I.V.:214.5; IV Piggyback:60] Out: [id number] [Urine:4600] Intake/Output this shift: Total I/O In: 114 [I.V.:114] Out: 500 [Urine:500]  General appearance: alert and cooperative Chest wall: no tenderness Cardio: regular rate and rhythm, S1, S2 normal, no murmur, click, rub or gallop Extremities: edema 2-3+  Lab Results:  Recent Labs  10/16/14 0225 10/17/14 0250  WBC 9.7 9.5  HGB 9.5* 9.2*  HCT 30.7* 29.7*  PLT 208 222   BMET:   Recent Labs  10/16/14 0845 10/17/14 0250  NA 135 137  K 4.6 4.7  CL 97* 96*  CO2 30 31  GLUCOSE 141* 232*  BUN 73* 72*  CREATININE 3.98* 3.99*  CALCIUM 6.9* 7.2*   No results for input(s): PTH in the last 72 hours. Iron Studies: No results for input(s): IRON, TIBC, TRANSFERRIN, FERRITIN in the last 72 hours. Studies/Results: Ct Hip Left Wo Contrast  10/16/2014   CLINICAL DATA:  Patient presented 10/12/2014 with shortness of breath and syncope and was found to have a non ST elevation MI. Prior history of left breast cancer post mastectomy. Acute  superimposed upon chronic left hip pain. No known injuries.  EXAM: CT OF THE LEFT HIP WITHOUT CONTRAST  TECHNIQUE: Multidetector CT imaging of the left hip was performed according to the standard protocol. Multiplanar CT image reconstructions were also generated.  COMPARISON:  Left hip x-rays 10/13/2014.  FINDINGS: No evidence of acute, subacute or healed fractures. Severe narrowing of the joint space posteriorly with associated hypertrophic spurring involving the posterior column of the acetabulum. Moderate joint space narrowing elsewhere. Very small joint effusion. No evidence of osseous metastatic disease involving the visualized bones comprising the left hip.  IMPRESSION: 1. No acute or subacute osseous abnormality. 2. Moderate osteoarthritis. 3. Very small joint effusion. 4. No evidence of osseous metastatic disease.   Electronically Signed   By: Evangeline Dakin M.D.   On: 10/16/2014 14:15   Scheduled: . antiseptic oral rinse  7 mL Mouth Rinse BID  . aspirin EC  81 mg Oral Daily  . atorvastatin  40 mg Oral q1800  . carvedilol  25 mg Oral BID WC  . docusate sodium  100 mg Oral BID  . furosemide  40 mg Intravenous TID  . gabapentin  300 mg Oral TID  . hydrALAZINE  100 mg Oral 3 times per day  . insulin aspart  0-15 Units Subcutaneous TID WC  . insulin aspart  0-5 Units Subcutaneous QHS  . insulin aspart  10 Units Subcutaneous TID WC  . [START ON 10/18/2014] insulin detemir  60 Units Subcutaneous  Daily  . ipratropium-albuterol  3 mL Nebulization TID  . isosorbide mononitrate  120 mg Oral Daily  . lidocaine  1 patch Transdermal Q24H  . multivitamin with minerals  1 tablet Oral Daily  . pantoprazole  40 mg Oral Daily  . sodium chloride  3 mL Intravenous Q12H  . sodium chloride  3 mL Intravenous Q12H     LOS: 5 days   Jordan [name] D 10/17/2014,1:16 PM

## 2014-10-17 NOTE — Progress Notes (Signed)
Patient Name: Jordan [name] Date of Encounter: 10/17/2014  Principal Problem:   SOB (shortness of breath) Active Problems:   Breast cancer   Diabetes mellitus without complication   Hypertension   Arthritis   COPD (chronic obstructive pulmonary disease)   Syncope   CKD (chronic kidney disease)   Acute on chronic respiratory failure   Chest pain with high risk for cardiac etiology   LBBB (left bundle branch block)   Elevated troponin   Acute systolic CHF (congestive heart failure)   NSTEMI (non-ST elevated myocardial infarction)   Pulmonary HTN   AKI (acute kidney injury)   Length of Stay: 5  SUBJECTIVE  Feeling better each day, but still orthopneic. >4L net fluid loss last 24 h,  Admission weight was likely inaccurate, but weight down 4 lb last 48h Creatinine unchanged around 4.0 last 3 days (admission 3.6, peak 4.3, "baseline" 3.15 on June 8). BP still mostly elevated, but improving CURRENT MEDS . antiseptic oral rinse  7 mL Mouth Rinse BID  . aspirin EC  81 mg Oral Daily  . atorvastatin  40 mg Oral q1800  . carvedilol  25 mg Oral BID WC  . furosemide  40 mg Intravenous TID  . gabapentin  300 mg Oral BID  . hydrALAZINE  100 mg Oral 3 times per day  . insulin aspart  0-15 Units Subcutaneous TID WC  . insulin aspart  0-5 Units Subcutaneous QHS  . insulin aspart  7 Units Subcutaneous TID WC  . insulin detemir  50 Units Subcutaneous Daily  . ipratropium-albuterol  3 mL Nebulization TID  . isosorbide mononitrate  60 mg Oral Daily  . lidocaine  1 patch Transdermal Q24H  . multivitamin with minerals  1 tablet Oral Daily  . pantoprazole  40 mg Oral Daily  . sodium chloride  3 mL Intravenous Q12H  . sodium chloride  3 mL Intravenous Q12H    OBJECTIVE   Intake/Output Summary (Last 24 hours) at 10/17/14 0903 Last data filed at 10/17/14 0700  Gross per 24 hour  Intake  274.5 ml  Output   4600 ml  Net -4325.5 ml   Filed Weights   10/15/14 0300 10/16/14 0500  10/17/14 0500  Weight: 322 lb (146.058 kg) 320 lb 5.3 oz (145.3 kg) 318 lb 6.4 oz (144.425 kg)    PHYSICAL EXAM Filed Vitals:   10/17/14 0022 10/17/14 0400 10/17/14 0500 10/17/14 0720  BP: 195/60 183/61  199/67  Pulse: 96 84  80  Temp:  98.1 F (36.7 C)  98 F (36.7 C)  TempSrc:  Oral  Oral  Resp: 22 18  13   Height:      Weight:   318 lb 6.4 oz (144.425 kg)   SpO2: 95% 98%  98%   General: Alert, oriented x3, no distress; morbid obesity limits the exam Head: no evidence of trauma, PERRL, EOMI, no exophtalmos or lid lag, no myxedema, no xanthelasma; normal ears, nose and oropharynx Neck: hard to evaluate the jugular venous pulsations and no hepatojugular reflux; brisk carotid pulses without delay and no carotid bruits Chest: clear to auscultation, no signs of consolidation by percussion or palpation, normal fremitus, symmetrical and full respiratory excursions Cardiovascular: unable to locate the apical impulse, regular rhythm, normal first and second heart sounds, no rubs or gallops, no murmur Abdomen: no tenderness or distention, no masses by palpation, no abnormal pulsatility or arterial bruits, normal bowel sounds, no hepatosplenomegaly Extremities: no clubbing, cyanosis; lymphedema left upper extremity; 3-4+ chronic brawny  edema of both legs, to level of knees, R>L; 2+ radial, ulnar and brachial pulses bilaterally; no subclavian bruits. Unable to evaluate lower extremity pulses Neurological: grossly nonfocal  LABS  CBC  Recent Labs  10/16/14 0225 10/17/14 0250  WBC 9.7 9.5  HGB 9.5* 9.2*  HCT 30.7* 29.7*  MCV 84.3 82.3  PLT 208 [id number]   Basic Metabolic Panel  Recent Labs  10/16/14 0845 10/17/14 0250  NA 135 137  K 4.6 4.7  CL 97* 96*  CO2 30 31  GLUCOSE 141* 232*  BUN 73* 72*  CREATININE 3.98* 3.99*  CALCIUM 6.9* 7.2*    Radiology Studies Imaging results have been reviewed and Ct Hip Left Wo Contrast  10/16/2014   CLINICAL DATA:  Patient presented 10/12/2014  with shortness of breath and syncope and was found to have a non ST elevation MI. Prior history of left breast cancer post mastectomy. Acute superimposed upon chronic left hip pain. No known injuries.  EXAM: CT OF THE LEFT HIP WITHOUT CONTRAST  TECHNIQUE: Multidetector CT imaging of the left hip was performed according to the standard protocol. Multiplanar CT image reconstructions were also generated.  COMPARISON:  Left hip x-rays 10/13/2014.  FINDINGS: No evidence of acute, subacute or healed fractures. Severe narrowing of the joint space posteriorly with associated hypertrophic spurring involving the posterior column of the acetabulum. Moderate joint space narrowing elsewhere. Very small joint effusion. No evidence of osseous metastatic disease involving the visualized bones comprising the left hip.  IMPRESSION: 1. No acute or subacute osseous abnormality. 2. Moderate osteoarthritis. 3. Very small joint effusion. 4. No evidence of osseous metastatic disease.   Electronically Signed   By: Evangeline Dakin M.D.   On: 10/16/2014 14:15    TELE NSR   ASSESSMENT AND PLAN  1. NSTEMI. Troponin elevation with flat curve suggests more of a demand type ischemia due to CHF and hypoxia. She has a LBBB of unknown duration. Echo demonstrates severe LV dysfunction with EF of 30% and regional wall motion abnormalities consistent with ischemic cardiomyopathy. These findings are new compared to Echo she had in [place] in May. She is high risk.   Plan right and left heart cath to define coronary anatomy and define treatment options, no sooner than Monday if renal function allows. If she progresses to dialysis then we could proceed with cath as well.   2. Acute on chronic systolic CHF. EF 30%. I/O negative >4L in last 24 h, almost 5L from admission. Weight recording probably inaccurate on first two hospital days. Not a candidate for ACEi/ARB due to CKD. Continue Coreg, nitrates (increase dose today) and hydralazine.  Diuretics per Renal.  3. CKD stage 4. Baseline creatinine 3.15 on June 8. Waiting for labs today. Slow down diuretics tomorrow in anticipation of contrast exposure on Tuesday  4. DM poorly controlled. On insulin. Per primary team.   5. HTN- severe, improved control. Will titrate Coreg, hydralazine, and nitrates for now. Diuresis may help as well.   6. Hyperlipidemia - continue statins. Elevated AST probably due to hepatic congestion with CHF.   7. COPD. Agree much of her breathing issues more related to CHF and obesity , "cardiac asthma".   8. History of breast CA With left arm lymphedema - do not use for IV or radial access  9. Super Obesity.  10. Pulmonary HTN - suspect major component of OSA/obesity-hypoventilation, but also left heart failure  11. LBBB - CRT may be an option down the road, if she fails  medical management. Obesity increases complexity of device implantation and increases risk of complications.   Sanda Klein, MD, [facility] [phone] office [phone] pager 10/17/2014 9:03 AM

## 2014-10-17 NOTE — Progress Notes (Signed)
*  Preliminary Results* Left lower extremity venous duplex completed. Study was technically limited due to patient body habitus. Visualized veins of the left lower extremity are negative for deep vein thrombosis. There is no evidence of left Baker's cyst.  10/17/2014 3:14 PM  Maudry Mayhew, RVT, RDCS, RDMS

## 2014-10-17 NOTE — Progress Notes (Addendum)
[facility]                                                                              Patient Demographics  Jordan [name], is a 74 y.o. female, DOB - 1941/03/13, [id number]  Admit date - 10/12/2014   Admitting Physician Ivor Costa, MD  Outpatient Primary MD for the patient is Pcp Not In System  LOS - 5   Chief Complaint  Patient presents with  . Respiratory Distress       Brief HPI   Jordan [name] is a 74 y.o. female with PMH of hypertension, diabetes mellitus, GERD, breast cancer, COPD, arthritis, chronic kidney disease (unknown stage), chronic leg edema, who presents with shortness of breath and syncope. Patient comes here from [place] for visiting her daughter. She had been doing fine until 11:30 on night of admission when she started having SOB. She does not have cough and chest pain. Per her daughter, patient did some exertion, then she passed out for few seconds. She did not have seizure. No head injury. Patient reports that she is taking Bumex for chronic leg edema, but denies congestive heart failure. She states that her leg swelling has been worsening recently.  Patient was found to have NSTEMI and is in need of a cath. Cath has been postponed while her kidney function is optimized.   Assessment & Plan    Principal Problem: Acute on chronic hypoxic respiratory failure likely due to NSTEMI and acute on chronic systolic CHF - Progressively improving  Active problems:  Acute on chronic systolic CHF exacerbation: Patient has left bundle branch block of unknown duration, troponin elevation - Echo from 5/16 in [place] shows 55-60%- no wall abnormalities, 2-D echo 6/28 showed EF of 30% with akinesis of inferior septum, akinesis of inferior wall, hypokinesis of anterior septum and akinesis of anterior wall - Not a candidate for ACEI/ARB due to chronic kidney disease - Continue Coreg, nitrates, hydralazine - Renal service also following for  diuresis, negative balance of 5.3 L , on IV Lasix - Cardiology planning cardiac cath  NSTEMI - 2-D echo 6/28 showed EF of 30% with akinesis of inferior septum, akinesis of inferior wall, hypokinesis of anterior septum and akinesis of anterior wall - Cardiology planning right and left heart cath if the renal function allows, continue heparin drip, Coreg, statin  COPD:  - Does not seem to have acute exacerbation- wheezing was most likely fluid - Improving, continue diuresis     DM-II: Insulin-dependent, uncontrolled -Increase Levemir to 60 units, increase meal coverage to 10 units 3 times a day, sliding scale insulin  - Hemoglobin A1c: 9.4  Hx of Breast cancer: s/p of L mastectomy, chemotherapy and radiation therapy. Patient has been followed up with oncologist. Last seen was 2 months ago, was told that her breast cancer was stable. -Follow-up with her oncologist in [place].  Hypertension: -Continue  Coreg, nitrates  GERD: -Continue PPI   AKI on CKD (chronic kidney disease): baseline appears to be: 3 1/16, underlying stage IV CKD - US-renal- medical renal disease -May need hemodialysis, nephrology following   Hip pain: Continues to complain of left hip  pain, has history of osteoarthritis  - hip x ray does not show fracture - CT of the left hip showed moderate osteoarthritis with very small joint effusion, no acute or subacute abnormality, will need. PT OT evaluation, increase ambulation - Place on pain control, increase Neurontin, added oxycodone    obesity -encourage weight loss, diet control  Code Status: full code  Family Communication: Discussed in detail with the patient, all imaging results, lab results explained to the patient    Disposition Plan: Remain in stepdown unit  Time Spent in minutes   25 minutes  Procedures  2d echo Left hip xray Renal US  Consults   Cardiology  Nephrology  DVT Prophylaxis  heparin drip  Medications  Scheduled Meds: . antiseptic  oral rinse  7 mL Mouth Rinse BID  . aspirin EC  81 mg Oral Daily  . atorvastatin  40 mg Oral q1800  . carvedilol  25 mg Oral BID WC  . furosemide  40 mg Intravenous TID  . gabapentin  300 mg Oral BID  . hydrALAZINE  100 mg Oral 3 times per day  . insulin aspart  0-15 Units Subcutaneous TID WC  . insulin aspart  0-5 Units Subcutaneous QHS  . insulin aspart  7 Units Subcutaneous TID WC  . insulin detemir  50 Units Subcutaneous Daily  . ipratropium-albuterol  3 mL Nebulization TID  . isosorbide mononitrate  120 mg Oral Daily  . lidocaine  1 patch Transdermal Q24H  . multivitamin with minerals  1 tablet Oral Daily  . pantoprazole  40 mg Oral Daily  . sodium chloride  3 mL Intravenous Q12H  . sodium chloride  3 mL Intravenous Q12H   Continuous Infusions: . heparin 1,900 Units/hr (10/17/14 0453)   PRN Meds:.sodium chloride, sodium chloride, acetaminophen, albuterol, capsaicin, guaiFENesin-codeine, HYDROcodone-acetaminophen, ondansetron (ZOFRAN) IV, sodium chloride, sodium chloride   Antibiotics   Anti-infectives    None        Subjective:   Jordan [name] was seen and examined today. Except for the hip pain, no other complaints. Patient denies dizziness, chest pain, shortness of breath, abdominal pain, N/V/D/C, new weakness, numbess, tingling.   Objective:   Blood pressure 145/41, pulse 66, temperature 98 F (36.7 C), temperature source Oral, resp. rate 17, height 5\' 5"  (1.651 m), weight 144.425 kg (318 lb 6.4 oz), SpO2 99 %.  Wt Readings from Last 3 Encounters:  10/17/14 144.425 kg (318 lb 6.4 oz)     Intake/Output Summary (Last 24 hours) at 10/17/14 1016 Last data filed at 10/17/14 1000  Gross per 24 hour  Intake  271.5 ml  Output   4200 ml  Net -3928.5 ml    Exam  General: Alert and oriented x 3, NAD  HEENT:  PERRLA, EOMI,  Neck: Supple,   CVS: S1 S2 auscultated, no mrg, NSR  Respiratory: Clear to auscultation bilaterally  Abdomen:  obese, Soft,  nontender, nondistended, + bowel sounds  Ext: no cyanosis clubbing, 3+ edema b/l LE   Neuro: no new changes  Skin: No rashes  Psych: Normal affect and demeanor, alert and oriented x3    Data Review   Micro Results Recent Results (from the past 240 hour(s))  MRSA PCR Screening     Status: None   Collection Time: 10/12/14  6:42 AM  Result Value Ref Range Status   MRSA by PCR NEGATIVE NEGATIVE Final    Comment:        The GeneXpert MRSA Assay (FDA approved for NASAL  specimens only), is one component of a comprehensive MRSA colonization surveillance program. It is not intended to diagnose MRSA infection nor to guide or monitor treatment for MRSA infections.     Radiology Reports US Renal  10/12/2014   CLINICAL DATA:  Acute renal insufficiency  EXAM: RENAL / URINARY TRACT ULTRASOUND COMPLETE  COMPARISON:  None.  FINDINGS: Right Kidney:  Length: 11.7 cm.  Mildly increased echogenicity.  No hydronephrosis.  Left Kidney:  Length: 12.1 cm.  Mildly increased echogenicity.  No hydronephrosis.  Bladder:  Appears normal for degree of bladder distention.  IMPRESSION: Negative for hydronephrosis. There is mildly increased renal parenchymal echogenicity consistent with medical renal disease.   Electronically Signed   By: Andreas Newport M.D.   On: 10/12/2014 06:29   Ct Hip Left Wo Contrast  10/16/2014   CLINICAL DATA:  Patient presented 10/12/2014 with shortness of breath and syncope and was found to have a non ST elevation MI. Prior history of left breast cancer post mastectomy. Acute superimposed upon chronic left hip pain. No known injuries.  EXAM: CT OF THE LEFT HIP WITHOUT CONTRAST  TECHNIQUE: Multidetector CT imaging of the left hip was performed according to the standard protocol. Multiplanar CT image reconstructions were also generated.  COMPARISON:  Left hip x-rays 10/13/2014.  FINDINGS: No evidence of acute, subacute or healed fractures. Severe narrowing of the joint space posteriorly  with associated hypertrophic spurring involving the posterior column of the acetabulum. Moderate joint space narrowing elsewhere. Very small joint effusion. No evidence of osseous metastatic disease involving the visualized bones comprising the left hip.  IMPRESSION: 1. No acute or subacute osseous abnormality. 2. Moderate osteoarthritis. 3. Very small joint effusion. 4. No evidence of osseous metastatic disease.   Electronically Signed   By: Evangeline Dakin M.D.   On: 10/16/2014 14:15   Dg Chest Port 1 View  10/15/2014   CLINICAL DATA:  CHF  EXAM: PORTABLE CHEST - 1 VIEW  COMPARISON:  10/12/2014  FINDINGS: Cardiomegaly with mild perihilar edema, improved. Left lung base is obscured, likely a combination of atelectasis and small pleural effusion.  IMPRESSION: Cardiomegaly with mild perihilar edema, improved.  Suspected small left pleural effusion.   Electronically Signed   By: Julian Hy M.D.   On: 10/15/2014 08:53   Dg Chest Portable 1 View  10/12/2014   CLINICAL DATA:  Respiratory distress  EXAM: PORTABLE CHEST - 1 VIEW  COMPARISON:  None.  FINDINGS: Central and basilar airspace opacities are present bilaterally. There is cardiomegaly. There probably is a small left pleural effusion.  IMPRESSION: Extensive airspace opacities and central and basilar distribution. This may represent alveolar edema associated with congestive heart failure. Probable left effusion. Cannot exclude infectious infiltrates.   Electronically Signed   By: Andreas Newport M.D.   On: 10/12/2014 01:51   Dg Hip Unilat With Pelvis 2-3 Views Left  10/13/2014   CLINICAL DATA:  Left hip pain.  EXAM: LEFT HIP (WITH PELVIS) 2-3 VIEWS  COMPARISON:  None.  FINDINGS: The left hip is located. No acute bone or soft tissue abnormality is present. Degenerative changes are noted in the SI joints bilaterally and lower lumbar spine.  IMPRESSION: 1. Normal radiographic appearance of the left hip. 2. Degenerative changes within the lower lumbar  spine and SI joints.   Electronically Signed   By: San Morelle M.D.   On: 10/13/2014 09:48    CBC  Recent Labs Lab 10/12/14 0100 10/13/14 0905 10/14/14 0320 10/15/14 0324 10/16/14 0225 10/17/14  0250  WBC 8.0 14.5* 12.5* 12.9* 9.7 9.5  HGB 11.1* 10.2* 9.3* 9.5* 9.5* 9.2*  HCT 36.0 32.9* 30.6* 30.9* 30.7* 29.7*  PLT 278 220 215 229 208 222  MCV 83.1 83.3 82.7 82.2 84.3 82.3  MCH 25.6* 25.8* 25.1* 25.3* 26.1 25.5*  MCHC 30.8 31.0 30.4 30.7 30.9 31.0  RDW 13.4 13.6 13.6 13.8 14.1 13.9  LYMPHSABS 2.4  --   --   --   --   --   MONOABS 0.5  --   --   --   --   --   EOSABS 0.1  --   --   --   --   --   BASOSABS 0.0  --   --   --   --   --     Chemistries   Recent Labs Lab 10/12/14 0100 10/13/14 0905 10/14/14 0320 10/15/14 0324 10/16/14 0845 10/17/14 0250  NA 137 136 134* 137 135 137  K 4.3 4.8 4.7 4.7 4.6 4.7  CL 103 99* 96* 102 97* 96*  CO2 20* 28 28 27 30 31   GLUCOSE 352* 300* 316* 239* 141* 232*  BUN 42* 53* 59* 71* 73* 72*  CREATININE 3.60* 3.90* 4.14* 4.03* 3.98* 3.99*  CALCIUM 7.2* 7.4* 7.0* 6.9* 6.9* 7.2*  AST 59*  --   --   --   --   --   ALT 37  --   --   --   --   --   ALKPHOS 101  --   --   --   --   --   BILITOT 0.2*  --   --   --   --   --    ------------------------------------------------------------------------------------------------------------------ estimated creatinine clearance is 18.8 mL/min (by C-G formula based on Cr of 3.99). ------------------------------------------------------------------------------------------------------------------ No results for input(s): HGBA1C in the last 72 hours. ------------------------------------------------------------------------------------------------------------------ No results for input(s): CHOL, HDL, LDLCALC, TRIG, CHOLHDL, LDLDIRECT in the last 72 hours. ------------------------------------------------------------------------------------------------------------------ No results for input(s):  TSH, T4TOTAL, T3FREE, THYROIDAB in the last 72 hours.  Invalid input(s): FREET3 ------------------------------------------------------------------------------------------------------------------ No results for input(s): VITAMINB12, FOLATE, FERRITIN, TIBC, IRON, RETICCTPCT in the last 72 hours.  Coagulation profile  Recent Labs Lab 10/12/14 0745 10/15/14 0324  INR 1.06 1.09    No results for input(s): DDIMER in the last 72 hours.  Cardiac Enzymes  Recent Labs Lab 10/12/14 0745 10/12/14 1053 10/12/14 1630  TROPONINI 0.51* 0.58* 0.60*   ------------------------------------------------------------------------------------------------------------------ Invalid input(s): POCBNP   Recent Labs  10/15/14 2151 10/16/14 0827 10/16/14 1208 10/16/14 1538 10/16/14 2159 10/17/14 0719  GLUCAP 252* 133* 130* 141* 136* 210*     Ollen Rao M.D. [facility] 10/17/2014, 10:16 AM  Pager: [phone]   Between 7am to 7pm - call Pager - [phone]  After 7pm go to [email or web address] - password [email or web address]  Call night coverage person covering after 7pm

## 2014-10-18 LAB — CBC
HEMATOCRIT: 27.2 % — AB (ref 36.0–46.0)
HEMOGLOBIN: 8.4 g/dL — AB (ref 12.0–15.0)
MCH: 25.5 pg — ABNORMAL LOW (ref 26.0–34.0)
MCHC: 30.9 g/dL (ref 30.0–36.0)
MCV: 82.4 fL (ref 78.0–100.0)
PLATELETS: 204 10*3/uL (ref 150–400)
RBC: 3.3 MIL/uL — ABNORMAL LOW (ref 3.87–5.11)
RDW: 13.9 % (ref 11.5–15.5)
WBC: 10.7 10*3/uL — ABNORMAL HIGH (ref 4.0–10.5)

## 2014-10-18 LAB — BASIC METABOLIC PANEL
Anion gap: 12 (ref 5–15)
BUN: 80 mg/dL — ABNORMAL HIGH (ref 6–20)
CALCIUM: 6.9 mg/dL — AB (ref 8.9–10.3)
CHLORIDE: 94 mmol/L — AB (ref 101–111)
CO2: 27 mmol/L (ref 22–32)
CREATININE: 4.39 mg/dL — AB (ref 0.44–1.00)
GFR calc Af Amer: 11 mL/min — ABNORMAL LOW (ref >60)
GFR calc non Af Amer: 9 mL/min — ABNORMAL LOW (ref >60)
GLUCOSE: 154 mg/dL — AB (ref 65–99)
POTASSIUM: 4.8 mmol/L (ref 3.5–5.1)
Sodium: 133 mmol/L — ABNORMAL LOW (ref 135–145)

## 2014-10-18 LAB — HEPARIN LEVEL (UNFRACTIONATED): Heparin Unfractionated: 0.39 IU/mL (ref 0.30–0.70)

## 2014-10-18 LAB — GLUCOSE, CAPILLARY
GLUCOSE-CAPILLARY: 168 mg/dL — AB (ref 65–99)
GLUCOSE-CAPILLARY: 209 mg/dL — AB (ref 65–99)
Glucose-Capillary: 171 mg/dL — ABNORMAL HIGH (ref 65–99)
Glucose-Capillary: 138 mg/dL — ABNORMAL HIGH (ref 65–99)

## 2014-10-18 MED ORDER — SODIUM CHLORIDE 0.9 % IJ SOLN: 3.0000 mL | INTRAMUSCULAR | Status: DC | PRN

## 2014-10-18 MED ORDER — SODIUM CHLORIDE 0.9 % IJ SOLN
3.0000 mL | Freq: Two times a day (BID) | INTRAMUSCULAR | Status: DC
Start: 2014-10-18 — End: 2014-10-18

## 2014-10-18 MED ORDER — SODIUM CHLORIDE 0.9 % WEIGHT BASED INFUSION
1.0000 mL/kg/h | INTRAVENOUS | Status: DC
  Administered 2014-10-18 – 2014-10-19 (×2): 1 mL/kg/h via INTRAVENOUS

## 2014-10-18 MED ORDER — SODIUM CHLORIDE 0.9 % IV SOLN: 250.0000 mL | INTRAVENOUS | Status: DC | PRN

## 2014-10-18 NOTE — Progress Notes (Signed)
Physical Therapy Treatment Patient Details Name: Jordan [name] MRN: [id number] DOB: [birth year] Today's Date: 10/18/2014    History of Present Illness 74 y.o. female with PMH of hypertension, diabetes mellitus, GERD, breast cancer, COPD, arthritis, chronic kidney disease (unknown stage), chronic leg edema, who presents with shortness of breath and syncope. Pt visiting from [place] for 3 weeks where she plans to return to care for sister s/p CVA and her spouse is in a SNF in [place].     PT Comments    Patient progressing slowly with ambulation.  More stiff and painful today reports arthritis flare.  Will continue skilled PT during acute stay and progress as tolerated.  Continue to recommend HHPT at d/c.  Follow Up Recommendations  Home health PT;Supervision for mobility/OOB     Equipment Recommendations  None recommended by PT    Recommendations for Other Services       Precautions / Restrictions Precautions Precautions: Fall Precaution Comments: watch sats Restrictions Weight Bearing Restrictions: No    Mobility  Bed Mobility               General bed mobility comments: in chair on arrival  Transfers Overall transfer level: Needs assistance Equipment used: Rolling walker (2 wheeled) Transfers: Sit to/from Stand Sit to Stand: +2 physical assistance;Mod assist         General transfer comment: increased assist needed from low surface of chair and due to c/o increased arthritic pain today  Ambulation/Gait Ambulation/Gait assistance: Min assist Ambulation Distance (Feet): 20 Feet (and 40') Assistive device: Rolling walker (2 wheeled) Gait Pattern/deviations: Step-through pattern;Decreased stride length;Shuffle;Wide base of support;Trunk flexed     General Gait Details: pt with flexed posture throughout and c/o weird feeling in chest after first walk to doorway, RN reports due to activity so pt encouraged to try second trial of ambulation and able to go another 40'  after seated rest   Stairs            Wheelchair Mobility    Modified Rankin (Stroke Patients Only)       Balance Overall balance assessment: Needs assistance         Standing balance support: Bilateral upper extremity supported Standing balance-Leahy Scale: Poor Standing balance comment: heavy reliance on UE support due to flexed posture (h/o arthritis)                    Cognition Arousal/Alertness: Awake/alert Behavior During Therapy: WFL for tasks assessed/performed Overall Cognitive Status: Within Functional Limits for tasks assessed                      Exercises General Exercises - Lower Extremity Ankle Circles/Pumps: AROM;Seated;Both;10 reps Long Arc Quad: AROM;Seated;Both;10 reps Hip Flexion/Marching: AROM;Seated;Both;10 reps    General Comments        Pertinent Vitals/Pain Pain Assessment: Faces Faces Pain Scale: Hurts even more Pain Location: legs, left hip Pain Descriptors / Indicators: Aching Pain Intervention(s): Monitored during session;Repositioned    Home Living                      Prior Function            PT Goals (current goals can now be found in the care plan section) Progress towards PT goals: Progressing toward goals    Frequency  Min 3X/week    PT Plan Current plan remains appropriate    Co-evaluation  End of Session Equipment Utilized During Treatment: Gait belt;Oxygen Activity Tolerance: Patient limited by fatigue Patient left: in chair;with call bell/phone within reach     Time: 0857-0924 PT Time Calculation (min) (ACUTE ONLY): 27 min  Charges:  $Gait Training: 8-22 mins $Therapeutic Exercise: 8-22 mins                    G Codes:      Tyaira Heward,CYNDI 2014-11-06, 10:11 AM  Magda Kiel, PT [phone] 2014/11/06

## 2014-10-18 NOTE — Progress Notes (Signed)
[place] for heparin Indication: chest pain/ACS Labs:  Recent Labs  10/16/14 0225 10/16/14 0845  10/17/14 0250 10/17/14 1242 10/18/14 0236  HGB 9.5*  --   --  9.2*  --  8.4*  HCT 30.7*  --   --  29.7*  --  27.2*  PLT 208  --   --  222  --  204  HEPARINUNFRC 0.18*  --   < > 0.21* 0.37 0.39  CREATININE  --  3.98*  --  3.99*  --  4.39*  < > = values in this interval not displayed.  Estimated Creatinine Clearance: 16.9 mL/min (by C-G formula based on Cr of 4.39).  Assessment: 74 yo female with admitted 10/12/2014  with SOB and syncope and possible CHF now noted with increased troponin. Pharmacy has been consulted to dose heparin for ACS. Pt from [place] here visiting daughter  PMH: arthritis, breast CA, CKD, COPD, DM, HTN   Anticoagulation/heme: ACS.  HL at goal, CBC trend down, no bleeding noted Hep 1900 u/h    Goal of Therapy:  Heparin level 0.3-0.7 units/ml Monitor platelets by anticoagulation protocol: Yes   Plan:  - Continue heparin 1900 uts/hr -Will follow cath plans -Daily heparin level and CBC  Thank you for allowing pharmacy to be a part of this patients care team.  Rowe Robert Pharm.D., BCPS, AQ-Cardiology Clinical Pharmacist 10/18/2014 9:41 AM Pager: [phone] Phone: [phone]

## 2014-10-18 NOTE — Progress Notes (Signed)
[facility]                                                                              Patient Demographics  Jordan [name], is a 74 y.o. female, DOB - 1940/07/21, [id number]  Admit date - 10/12/2014   Admitting Physician Ivor Costa, MD  Outpatient Primary MD for the patient is Pcp Not In System  LOS - 6   Chief Complaint  Patient presents with  . Respiratory Distress       Brief HPI   Jordan [name] is a 74 y.o. female with PMH of hypertension, diabetes mellitus, GERD, breast cancer, COPD, arthritis, chronic kidney disease (unknown stage), chronic leg edema, who presents with shortness of breath and syncope. Patient comes here from [place] for visiting her daughter. She had been doing fine until 11:30 on night of admission when she started having SOB. She does not have cough and chest pain. Per her daughter, patient did some exertion, then she passed out for few seconds. She did not have seizure. No head injury. Patient reports that she is taking Bumex for chronic leg edema, but denies congestive heart failure. She states that her leg swelling has been worsening recently.  Patient was found to have NSTEMI and is in need of a cath. Cath has been postponed while her kidney function is optimized.   Assessment & Plan    Principal Problem: Acute on chronic hypoxic respiratory failure likely due to NSTEMI and acute on chronic systolic CHF - Progressively improving  Active problems:  Acute on chronic systolic CHF exacerbation: Patient has left bundle branch block of unknown duration, troponin elevation - Echo from 5/16 in [place] shows 55-60%- no wall abnormalities, 2-D echo 6/28 showed EF of 30% with akinesis of inferior septum, akinesis of inferior wall, hypokinesis of anterior septum and akinesis of anterior wall - Not a candidate for ACEI/ARB due to chronic kidney disease - Continue Coreg, nitrates, hydralazine - Renal service also following for  diuresis, negative balance of 5.3 L , on IV Lasix - Cardiology planning cardiac cath tomorrow  NSTEMI - 2-D echo 6/28 showed EF of 30% with akinesis of inferior septum, akinesis of inferior wall, hypokinesis of anterior septum and akinesis of anterior wall - Cardiology planning right and left heart cath if the renal function allows, continue heparin drip, Coreg, statin - Dietetics currently on hold for planned cath  COPD:  - Does not seem to have acute exacerbation- wheezing was most likely fluid  DM-II: Insulin-dependent, uncontrolled - CBGs better today, continue Levemir 60 units, meal coverage to 10 units 3 times a day, sliding scale insulin  - Hemoglobin A1c: 9.4  Hx of Breast cancer: s/p of L mastectomy, chemotherapy and radiation therapy. Patient has been followed up with oncologist. Last seen was 2 months ago, was told that her breast cancer was stable. -Follow-up with her oncologist in [place].  Hypertension: Improving today -Continue Coreg, nitrates, hydralazine  GERD: -Continue PPI   AKI on CKD (chronic kidney disease): baseline appears to be: 3 1/16, underlying stage IV CKD - US-renal- medical renal disease - Nephrology signed off 7/3, if creatinine worsening after  the cardiac cath, will need to call back  Left Hip pain has history of osteoarthritis : Feeling better today - hip x ray does not show fracture - CT of the left hip showed moderate osteoarthritis with very small joint effusion, no acute or subacute abnormality, will need.  - PT OT evaluation, increase ambulation - Place on pain control, increase Neurontin, added oxycodone    obesity -encourage weight loss, diet control  Code Status: full code  Family Communication: Discussed in detail with the patient, all imaging results, lab results explained to the patient    Disposition Plan: Remain in stepdown unit   Time Spent in minutes   25 minutes  Procedures  2d echo Left hip xray Renal US  Consults     Cardiology  Nephrology  DVT Prophylaxis  heparin drip  Medications  Scheduled Meds: . antiseptic oral rinse  7 mL Mouth Rinse BID  . aspirin EC  81 mg Oral Daily  . atorvastatin  40 mg Oral q1800  . carvedilol  25 mg Oral BID WC  . docusate sodium  100 mg Oral BID  . gabapentin  300 mg Oral TID  . hydrALAZINE  100 mg Oral 3 times per day  . insulin aspart  0-15 Units Subcutaneous TID WC  . insulin aspart  0-5 Units Subcutaneous QHS  . insulin aspart  10 Units Subcutaneous TID WC  . insulin detemir  60 Units Subcutaneous Daily  . isosorbide mononitrate  120 mg Oral Daily  . lidocaine  1 patch Transdermal Q24H  . multivitamin with minerals  1 tablet Oral Daily  . pantoprazole  40 mg Oral Daily  . sodium chloride  3 mL Intravenous Q12H  . sodium chloride  3 mL Intravenous Q12H   Continuous Infusions: . heparin 1,900 Units/hr (10/18/14 0000)   PRN Meds:.sodium chloride, sodium chloride, acetaminophen, albuterol, capsaicin, guaiFENesin-codeine, ipratropium-albuterol, ondansetron (ZOFRAN) IV, oxyCODONE, polyethylene glycol, sodium chloride, sodium chloride   Antibiotics   Anti-infectives    None        Subjective:   Myanna [name] was seen and examined today. Feeling better today, left hip pain better, no other complaints, no chest pain, no shortness of breath, no fevers or chills. Patient denies dizziness, abdominal pain, N/V/D/C, new weakness, numbess, tingling.   Objective:   Blood pressure 142/40, pulse 93, temperature 98 F (36.7 C), temperature source Oral, resp. rate 17, height 5\' 5"  (1.651 m), weight 142.702 kg (314 lb 9.6 oz), SpO2 92 %.  Wt Readings from Last 3 Encounters:  10/18/14 142.702 kg (314 lb 9.6 oz)     Intake/Output Summary (Last 24 hours) at 10/18/14 1134 Last data filed at 10/18/14 1100  Gross per 24 hour  Intake   1157 ml  Output   1550 ml  Net   -393 ml    Exam  General: Alert and oriented x 3, NAD  HEENT:  PERRLA, EOMI,  Neck:  Supple,   CVS: S1 S2clear, no mrg  Respiratory: CTAB  Abdomen:  obese, soft, NT, ND, NBS  Ext: no cyanosis clubbing, 3+ edema b/l LE   Neuro: no new changes  Skin: No rashes  Psych: Normal affect and demeanor, alert and oriented x3    Data Review   Micro Results Recent Results (from the past 240 hour(s))  MRSA PCR Screening     Status: None   Collection Time: 10/12/14  6:42 AM  Result Value Ref Range Status   MRSA by PCR NEGATIVE NEGATIVE Final  Comment:        The GeneXpert MRSA Assay (FDA approved for NASAL specimens only), is one component of a comprehensive MRSA colonization surveillance program. It is not intended to diagnose MRSA infection nor to guide or monitor treatment for MRSA infections.     Radiology Reports US Renal  10/12/2014   CLINICAL DATA:  Acute renal insufficiency  EXAM: RENAL / URINARY TRACT ULTRASOUND COMPLETE  COMPARISON:  None.  FINDINGS: Right Kidney:  Length: 11.7 cm.  Mildly increased echogenicity.  No hydronephrosis.  Left Kidney:  Length: 12.1 cm.  Mildly increased echogenicity.  No hydronephrosis.  Bladder:  Appears normal for degree of bladder distention.  IMPRESSION: Negative for hydronephrosis. There is mildly increased renal parenchymal echogenicity consistent with medical renal disease.   Electronically Signed   By: Andreas Newport M.D.   On: 10/12/2014 06:29   Ct Hip Left Wo Contrast  10/16/2014   CLINICAL DATA:  Patient presented 10/12/2014 with shortness of breath and syncope and was found to have a non ST elevation MI. Prior history of left breast cancer post mastectomy. Acute superimposed upon chronic left hip pain. No known injuries.  EXAM: CT OF THE LEFT HIP WITHOUT CONTRAST  TECHNIQUE: Multidetector CT imaging of the left hip was performed according to the standard protocol. Multiplanar CT image reconstructions were also generated.  COMPARISON:  Left hip x-rays 10/13/2014.  FINDINGS: No evidence of acute, subacute or healed  fractures. Severe narrowing of the joint space posteriorly with associated hypertrophic spurring involving the posterior column of the acetabulum. Moderate joint space narrowing elsewhere. Very small joint effusion. No evidence of osseous metastatic disease involving the visualized bones comprising the left hip.  IMPRESSION: 1. No acute or subacute osseous abnormality. 2. Moderate osteoarthritis. 3. Very small joint effusion. 4. No evidence of osseous metastatic disease.   Electronically Signed   By: Evangeline Dakin M.D.   On: 10/16/2014 14:15   Dg Chest Port 1 View  10/15/2014   CLINICAL DATA:  CHF  EXAM: PORTABLE CHEST - 1 VIEW  COMPARISON:  10/12/2014  FINDINGS: Cardiomegaly with mild perihilar edema, improved. Left lung base is obscured, likely a combination of atelectasis and small pleural effusion.  IMPRESSION: Cardiomegaly with mild perihilar edema, improved.  Suspected small left pleural effusion.   Electronically Signed   By: Julian Hy M.D.   On: 10/15/2014 08:53   Dg Chest Portable 1 View  10/12/2014   CLINICAL DATA:  Respiratory distress  EXAM: PORTABLE CHEST - 1 VIEW  COMPARISON:  None.  FINDINGS: Central and basilar airspace opacities are present bilaterally. There is cardiomegaly. There probably is a small left pleural effusion.  IMPRESSION: Extensive airspace opacities and central and basilar distribution. This may represent alveolar edema associated with congestive heart failure. Probable left effusion. Cannot exclude infectious infiltrates.   Electronically Signed   By: Andreas Newport M.D.   On: 10/12/2014 01:51   Dg Hip Unilat With Pelvis 2-3 Views Left  10/13/2014   CLINICAL DATA:  Left hip pain.  EXAM: LEFT HIP (WITH PELVIS) 2-3 VIEWS  COMPARISON:  None.  FINDINGS: The left hip is located. No acute bone or soft tissue abnormality is present. Degenerative changes are noted in the SI joints bilaterally and lower lumbar spine.  IMPRESSION: 1. Normal radiographic appearance of the  left hip. 2. Degenerative changes within the lower lumbar spine and SI joints.   Electronically Signed   By: San Morelle M.D.   On: 10/13/2014 09:48  CBC  Recent Labs Lab 10/12/14 0100  10/14/14 0320 10/15/14 0324 10/16/14 0225 10/17/14 0250 10/18/14 0236  WBC 8.0  < > 12.5* 12.9* 9.7 9.5 10.7*  HGB 11.1*  < > 9.3* 9.5* 9.5* 9.2* 8.4*  HCT 36.0  < > 30.6* 30.9* 30.7* 29.7* 27.2*  PLT 278  < > 215 229 208 222 204  MCV 83.1  < > 82.7 82.2 84.3 82.3 82.4  MCH 25.6*  < > 25.1* 25.3* 26.1 25.5* 25.5*  MCHC 30.8  < > 30.4 30.7 30.9 31.0 30.9  RDW 13.4  < > 13.6 13.8 14.1 13.9 13.9  LYMPHSABS 2.4  --   --   --   --   --   --   MONOABS 0.5  --   --   --   --   --   --   EOSABS 0.1  --   --   --   --   --   --   BASOSABS 0.0  --   --   --   --   --   --   < > = values in this interval not displayed.  Chemistries   Recent Labs Lab 10/12/14 0100  10/14/14 0320 10/15/14 0324 10/16/14 0845 10/17/14 0250 10/18/14 0236  NA 137  < > 134* 137 135 137 133*  K 4.3  < > 4.7 4.7 4.6 4.7 4.8  CL 103  < > 96* 102 97* 96* 94*  CO2 20*  < > 28 27 30 31 27   GLUCOSE 352*  < > 316* 239* 141* 232* 154*  BUN 42*  < > 59* 71* 73* 72* 80*  CREATININE 3.60*  < > 4.14* 4.03* 3.98* 3.99* 4.39*  CALCIUM 7.2*  < > 7.0* 6.9* 6.9* 7.2* 6.9*  AST 59*  --   --   --   --   --   --   ALT 37  --   --   --   --   --   --   ALKPHOS 101  --   --   --   --   --   --   BILITOT 0.2*  --   --   --   --   --   --   < > = values in this interval not displayed. ------------------------------------------------------------------------------------------------------------------ estimated creatinine clearance is 16.9 mL/min (by C-G formula based on Cr of 4.39). ------------------------------------------------------------------------------------------------------------------ No results for input(s): HGBA1C in the last 72  hours. ------------------------------------------------------------------------------------------------------------------ No results for input(s): CHOL, HDL, LDLCALC, TRIG, CHOLHDL, LDLDIRECT in the last 72 hours. ------------------------------------------------------------------------------------------------------------------ No results for input(s): TSH, T4TOTAL, T3FREE, THYROIDAB in the last 72 hours.  Invalid input(s): FREET3 ------------------------------------------------------------------------------------------------------------------ No results for input(s): VITAMINB12, FOLATE, FERRITIN, TIBC, IRON, RETICCTPCT in the last 72 hours.  Coagulation profile  Recent Labs Lab 10/12/14 0745 10/15/14 0324  INR 1.06 1.09    No results for input(s): DDIMER in the last 72 hours.  Cardiac Enzymes  Recent Labs Lab 10/12/14 0745 10/12/14 1053 10/12/14 1630  TROPONINI 0.51* 0.58* 0.60*   ------------------------------------------------------------------------------------------------------------------ Invalid input(s): POCBNP   Recent Labs  10/16/14 2159 10/17/14 0719 10/17/14 1126 10/17/14 1617 10/17/14 2111 10/18/14 0812  GLUCAP 136* 210* 246* 175* 146* 168*     Camyah Pultz M.D. [facility] 10/18/2014, 11:34 AM  Pager: [phone]   Between 7am to 7pm - call Pager - [phone]  After 7pm go to [email or web address] - password [email or web address]  Call night coverage person covering after 7pm

## 2014-10-18 NOTE — Progress Notes (Signed)
Patient Name: Jordan [name] Date of Encounter: 10/18/2014  Principal Problem:   SOB (shortness of breath) Active Problems:   Breast cancer   Diabetes mellitus without complication   Hypertension   Arthritis   COPD (chronic obstructive pulmonary disease)   Syncope   CKD (chronic kidney disease)   Acute on chronic respiratory failure   Chest pain with high risk for cardiac etiology   LBBB (left bundle branch block)   Elevated troponin   Acute systolic CHF (congestive heart failure)   NSTEMI (non-ST elevated myocardial infarction)   Pulmonary HTN   AKI (acute kidney injury)   Length of Stay: 6  SUBJECTIVE  Feeling better each day, breathing is better 5.7L net fluid loss this admission Admission weight was likely inaccurate, but weight down 8 lb last 72h BP improved CURRENT MEDS . antiseptic oral rinse  7 mL Mouth Rinse BID  . aspirin EC  81 mg Oral Daily  . atorvastatin  40 mg Oral q1800  . carvedilol  25 mg Oral BID WC  . docusate sodium  100 mg Oral BID  . furosemide  40 mg Intravenous TID  . gabapentin  300 mg Oral TID  . hydrALAZINE  100 mg Oral 3 times per day  . insulin aspart  0-15 Units Subcutaneous TID WC  . insulin aspart  0-5 Units Subcutaneous QHS  . insulin aspart  10 Units Subcutaneous TID WC  . insulin detemir  60 Units Subcutaneous Daily  . isosorbide mononitrate  120 mg Oral Daily  . lidocaine  1 patch Transdermal Q24H  . multivitamin with minerals  1 tablet Oral Daily  . pantoprazole  40 mg Oral Daily  . sodium chloride  3 mL Intravenous Q12H  . sodium chloride  3 mL Intravenous Q12H    OBJECTIVE   Intake/Output Summary (Last 24 hours) at 10/18/14 [id number] Last data filed at 10/18/14 0500  Gross per 24 hour  Intake    999 ml  Output   1825 ml  Net   -826 ml   Filed Weights   10/16/14 0500 10/17/14 0500 10/18/14 0500  Weight: 145.3 kg (320 lb 5.3 oz) 144.425 kg (318 lb 6.4 oz) 142.702 kg (314 lb 9.6 oz)    PHYSICAL EXAM Filed  Vitals:   10/17/14 2330 10/18/14 0000 10/18/14 0400 10/18/14 0500  BP:      Pulse: 70 64 63   Temp: 98.5 F (36.9 C)  98.7 F (37.1 C)   TempSrc: Oral  Oral   Resp: 23 9 14    Height:      Weight:    142.702 kg (314 lb 9.6 oz)  SpO2: 97% 99% 98%    General: Alert, oriented x3, no distress; morbid obesity limits the exam Head: Normal Neck: hard to evaluate the jugular venous pulsations and no hepatojugular reflux; brisk carotid pulses without delay and no carotid bruits Chest: clear to auscultation, no signs of consolidation by percussion or palpation,symmetrical and full respiratory excursions Cardiovascular:  regular rhythm, normal first and second heart sounds, no rubs or gallops, no murmur Abdomen: no tenderness or distention, no masses by palpation, no abnormal pulsatility or arterial bruits, normal bowel sounds, no hepatosplenomegaly Extremities: no clubbing, cyanosis; lymphedema left upper extremity; 2+ chronic brawny edema of both legs, to level of knees, R>L; 2+ radial, ulnar and brachial pulses bilaterally; no subclavian bruits. Unable to evaluate lower extremity pulses Neurological: grossly nonfocal  LABS  CBC  Recent Labs  10/17/14 0250 10/18/14 0236  WBC  9.5 10.7*  HGB 9.2* 8.4*  HCT 29.7* 27.2*  MCV 82.3 82.4  PLT 222 0000000   Basic Metabolic Panel  Recent Labs  10/17/14 0250 10/18/14 0236  NA 137 133*  K 4.7 4.8  CL 96* 94*  CO2 31 27  GLUCOSE 232* 154*  BUN 72* 80*  CREATININE 3.99* 4.39*  CALCIUM 7.2* 6.9*    Radiology Studies Imaging results have been reviewed and Ct Hip Left Wo Contrast  10/16/2014   CLINICAL DATA:  Patient presented 10/12/2014 with shortness of breath and syncope and was found to have a non ST elevation MI. Prior history of left breast cancer post mastectomy. Acute superimposed upon chronic left hip pain. No known injuries.  EXAM: CT OF THE LEFT HIP WITHOUT CONTRAST  TECHNIQUE: Multidetector CT imaging of the left hip was performed  according to the standard protocol. Multiplanar CT image reconstructions were also generated.  COMPARISON:  Left hip x-rays 10/13/2014.  FINDINGS: No evidence of acute, subacute or healed fractures. Severe narrowing of the joint space posteriorly with associated hypertrophic spurring involving the posterior column of the acetabulum. Moderate joint space narrowing elsewhere. Very small joint effusion. No evidence of osseous metastatic disease involving the visualized bones comprising the left hip.  IMPRESSION: 1. No acute or subacute osseous abnormality. 2. Moderate osteoarthritis. 3. Very small joint effusion. 4. No evidence of osseous metastatic disease.   Electronically Signed   By: Evangeline Dakin M.D.   On: 10/16/2014 14:15    TELE NSR   ASSESSMENT AND PLAN  1. NSTEMI. Troponin elevation with flat curve suggests more of a demand type ischemia due to CHF and hypoxia. She has a LBBB of unknown duration. Echo demonstrates severe LV dysfunction with EF of 30% and regional wall motion abnormalities consistent with ischemic cardiomyopathy. These findings are new compared to Echo she had in [place] in May. She is high risk.   Plan right and left heart cath to define coronary anatomy and define treatment options, will plan to do Monday. If she progresses to dialysis then we could proceed with cath as well.   2. Acute on chronic systolic CHF. EF 30%. I/O negative 5.7L since admission. Weight is down. Not a candidate for ACEi/ARB due to CKD. Continue Coreg, nitrates, and hydralazine. Diuretics on hold for planned cath.  3. CKD stage 4. Baseline creatinine 3.15 on June 8. Creatinine fluctuating 3.65-4.3 this admission. Renal evaluated and signed off yesterday. Will hydrate overnight tonight in plan for cardiac cath tomorrow.   4. DM poorly controlled. On insulin. Per primary team.   5. HTN- severe, improved control. On Coreg, hydralazine, and nitrates for now.    6. Hyperlipidemia - continue statins.  Elevated AST probably due to hepatic congestion with CHF.   7. COPD. Agree much of her breathing issues more related to CHF and obesity , "cardiac asthma".   8. History of breast CA With left arm lymphedema - do not use for IV or radial access  9. Super Obesity.  10. Pulmonary HTN - suspect major component of OSA/obesity-hypoventilation, but also left heart failure  11. LBBB - CRT may be an option down the road, if she fails medical management. Obesity increases complexity of device implantation and increases risk of complications.  Peter Martinique MD, [facility]   [facility] [phone] office [phone] pager 10/18/2014 7:12 AM

## 2014-10-18 NOTE — Care Management (Signed)
Important Message  Patient Details  Name: Jordan [name] MRN: [id number] Date of Birth: 1940-07-28   Medicare Important Message Given:  Yes-second notification given    Loann Quill 10/18/2014, 9:16 AM

## 2014-10-19 ENCOUNTER — Encounter (HOSPITAL_COMMUNITY)
Admission: EM | Disposition: A | Discharge status: Home Health | Payer: Self-pay | Source: Home / Self Care | Attending: Family Medicine

## 2014-10-19 ENCOUNTER — Encounter (HOSPITAL_COMMUNITY): Payer: Self-pay | Admitting: Cardiology

## 2014-10-19 HISTORY — PX: CARDIAC CATHETERIZATION: SHX172

## 2014-10-19 LAB — BASIC METABOLIC PANEL
Anion gap: 11 (ref 5–15)
BUN: 90 mg/dL — ABNORMAL HIGH (ref 6–20)
CALCIUM: 6.8 mg/dL — AB (ref 8.9–10.3)
CHLORIDE: 97 mmol/L — AB (ref 101–111)
CO2: 26 mmol/L (ref 22–32)
CREATININE: 4.49 mg/dL — AB (ref 0.44–1.00)
GFR calc Af Amer: 10 mL/min — ABNORMAL LOW (ref >60)
GFR calc non Af Amer: 9 mL/min — ABNORMAL LOW (ref >60)
Glucose, Bld: 184 mg/dL — ABNORMAL HIGH (ref 65–99)
Potassium: 5.1 mmol/L (ref 3.5–5.1)
Sodium: 134 mmol/L — ABNORMAL LOW (ref 135–145)

## 2014-10-19 LAB — CBC
HCT: 26.6 % — ABNORMAL LOW (ref 36.0–46.0)
Hemoglobin: 8.5 g/dL — ABNORMAL LOW (ref 12.0–15.0)
MCH: 26.6 pg (ref 26.0–34.0)
MCHC: 32 g/dL (ref 30.0–36.0)
MCV: 83.1 fL (ref 78.0–100.0)
PLATELETS: 204 10*3/uL (ref 150–400)
RBC: 3.2 MIL/uL — AB (ref 3.87–5.11)
RDW: 14.3 % (ref 11.5–15.5)
WBC: 10.5 10*3/uL (ref 4.0–10.5)

## 2014-10-19 LAB — GLUCOSE, CAPILLARY
GLUCOSE-CAPILLARY: 176 mg/dL — AB (ref 65–99)
Glucose-Capillary: 181 mg/dL — ABNORMAL HIGH (ref 65–99)
Glucose-Capillary: 218 mg/dL — ABNORMAL HIGH (ref 65–99)
Glucose-Capillary: 180 mg/dL — ABNORMAL HIGH (ref 65–99)

## 2014-10-19 LAB — POCT I-STAT 3, ART BLOOD GAS (G3+)
Acid-Base Excess: 1 mmol/L (ref 0.0–2.0)
BICARBONATE: 27.5 meq/L — AB (ref 20.0–24.0)
O2 Saturation: 96 %
PCO2 ART: 53 mmHg — AB (ref 35.0–45.0)
PH ART: 7.322 — AB (ref 7.350–7.450)
TCO2: 29 mmol/L (ref 0–100)
pO2, Arterial: 88 mmHg (ref 80.0–100.0)

## 2014-10-19 LAB — IRON AND TIBC
Iron: 60 ug/dL (ref 28–170)
Saturation Ratios: 27 % (ref 10.4–31.8)
TIBC: 221 ug/dL — AB (ref 250–450)
UIBC: 161 ug/dL

## 2014-10-19 LAB — POCT I-STAT 3, VENOUS BLOOD GAS (G3P V)
Acid-Base Excess: 2 mmol/L (ref 0.0–2.0)
BICARBONATE: 28.7 meq/L — AB (ref 20.0–24.0)
O2 Saturation: 58 %
PCO2 VEN: 59 mmHg — AB (ref 45.0–50.0)
PH VEN: 7.296 (ref 7.250–7.300)
TCO2: 30 mmol/L (ref 0–100)
pO2, Ven: 34 mmHg (ref 30.0–45.0)

## 2014-10-19 LAB — FERRITIN: FERRITIN: 296 ng/mL (ref 11–307)

## 2014-10-19 LAB — HEPARIN LEVEL (UNFRACTIONATED): HEPARIN UNFRACTIONATED: 0.27 [IU]/mL — AB (ref 0.30–0.70)

## 2014-10-19 SURGERY — RIGHT/LEFT HEART CATH AND CORONARY ANGIOGRAPHY
Anesthesia: LOCAL

## 2014-10-19 MED ORDER — SODIUM CHLORIDE 0.9 % IJ SOLN: 3.0000 mL | INTRAMUSCULAR | Status: DC | PRN

## 2014-10-19 MED ORDER — HEPARIN SODIUM (PORCINE) 5000 UNIT/ML IJ SOLN
5000.0000 [IU] | Freq: Three times a day (TID) | INTRAMUSCULAR | Status: DC
  Administered 2014-10-19 – 2014-10-20 (×3): 5000 [IU] via SUBCUTANEOUS
  Filled 2014-10-19 (×6): qty 1

## 2014-10-19 MED ORDER — INSULIN DETEMIR 100 UNIT/ML ~~LOC~~ SOLN
60.0000 [IU] | Freq: Every day | SUBCUTANEOUS | Status: DC
  Administered 2014-10-20 – 2014-10-27 (×6): 60 [IU] via SUBCUTANEOUS
  Filled 2014-10-19 (×8): qty 0.6

## 2014-10-19 MED ORDER — LIDOCAINE HCL (PF) 1 % IJ SOLN
INTRAMUSCULAR | Status: DC | PRN
  Administered 2014-10-19: 12:00:00

## 2014-10-19 MED ORDER — SODIUM CHLORIDE 0.9 % IJ SOLN
3.0000 mL | Freq: Two times a day (BID) | INTRAMUSCULAR | Status: DC
  Administered 2014-10-19 – 2014-10-20 (×2): 3 mL via INTRAVENOUS

## 2014-10-19 MED ORDER — SODIUM CHLORIDE 0.9 % IV SOLN: 250.0000 mL | INTRAVENOUS | Status: DC | PRN

## 2014-10-19 MED ORDER — HEPARIN SODIUM (PORCINE) 1000 UNIT/ML IJ SOLN
INTRAMUSCULAR | Status: DC | PRN
  Administered 2014-10-19: 6000 [IU] via INTRAVENOUS

## 2014-10-19 MED ORDER — INSULIN DETEMIR 100 UNIT/ML ~~LOC~~ SOLN
30.0000 [IU] | Freq: Once | SUBCUTANEOUS | Status: AC
  Administered 2014-10-19: 30 [IU] via SUBCUTANEOUS
  Filled 2014-10-19 (×2): qty 0.3

## 2014-10-19 MED ORDER — IOHEXOL 350 MG/ML SOLN
INTRAVENOUS | Status: DC | PRN
  Administered 2014-10-19: 40 mL via INTRA_ARTERIAL

## 2014-10-19 MED ORDER — FUROSEMIDE 10 MG/ML IJ SOLN
80.0000 mg | Freq: Two times a day (BID) | INTRAMUSCULAR | Status: DC
  Administered 2014-10-19 – 2014-10-22 (×5): 80 mg via INTRAVENOUS
  Filled 2014-10-19 (×9): qty 8

## 2014-10-19 MED ORDER — HEPARIN (PORCINE) IN NACL 2-0.9 UNIT/ML-% IJ SOLN
INTRAMUSCULAR | Status: AC
Start: 2014-10-19 — End: 2014-10-19
  Filled 2014-10-19: qty 1500

## 2014-10-19 MED ORDER — HEPARIN SODIUM (PORCINE) 1000 UNIT/ML IJ SOLN
INTRAMUSCULAR | Status: AC
  Filled 2014-10-19: qty 1

## 2014-10-19 MED ORDER — VERAPAMIL HCL 2.5 MG/ML IV SOLN
INTRAVENOUS | Status: DC | PRN
  Administered 2014-10-19: 11:00:00 via INTRA_ARTERIAL

## 2014-10-19 MED ORDER — LIDOCAINE HCL (PF) 1 % IJ SOLN
INTRAMUSCULAR | Status: AC
  Filled 2014-10-19: qty 30

## 2014-10-19 MED ORDER — HEPARIN (PORCINE) IN NACL 2-0.9 UNIT/ML-% IJ SOLN
INTRAMUSCULAR | Status: AC
  Filled 2014-10-19: qty 500

## 2014-10-19 MED ORDER — SODIUM CHLORIDE 0.9 % IJ SOLN
3.0000 mL | Freq: Two times a day (BID) | INTRAMUSCULAR | Status: DC
  Administered 2014-10-19 – 2014-10-24 (×10): 3 mL via INTRAVENOUS

## 2014-10-19 MED ORDER — VERAPAMIL HCL 2.5 MG/ML IV SOLN
INTRAVENOUS | Status: AC
  Filled 2014-10-19: qty 2

## 2014-10-19 SURGICAL SUPPLY — 21 items
CATH BALLN WEDGE 5F 110CM (CATHETERS) ×2 IMPLANT
CATH INFINITI 5 FR JL3.5 (CATHETERS) ×2 IMPLANT
CATH INFINITI 5FR ANG PIGTAIL (CATHETERS) ×2 IMPLANT
CATH INFINITI 5FR MULTPACK ANG (CATHETERS) IMPLANT
CATH INFINITI JR4 5F (CATHETERS) ×2 IMPLANT
CATH SWAN GANZ 7F STRAIGHT (CATHETERS) IMPLANT
DEVICE RAD COMP TR BAND LRG (VASCULAR PRODUCTS) ×2 IMPLANT
GLIDESHEATH SLEND SS 6F .021 (SHEATH) ×2 IMPLANT
GUIDEWIRE .025 260CM (WIRE) ×2 IMPLANT
HOVERMATT SINGLE USE (MISCELLANEOUS) ×2 IMPLANT
KIT HEART LEFT (KITS) ×2 IMPLANT
KIT HEART RIGHT NAMIC (KITS) ×2 IMPLANT
PACK CARDIAC CATHETERIZATION (CUSTOM PROCEDURE TRAY) ×2 IMPLANT
SHEATH FAST CATH BRACH 5F 5CM (SHEATH) ×2 IMPLANT
SHEATH PINNACLE 5F 10CM (SHEATH) IMPLANT
SHEATH PINNACLE 7F 10CM (SHEATH) IMPLANT
SYR MEDRAD MARK V 150ML (SYRINGE) ×2 IMPLANT
TRANSDUCER W/STOPCOCK (MISCELLANEOUS) ×4 IMPLANT
TUBING CIL FLEX 10 FLL-RA (TUBING) ×2 IMPLANT
WIRE EMERALD 3MM-J .035X150CM (WIRE) IMPLANT
WIRE SAFE-T 1.5MM-J .035X260CM (WIRE) ×2 IMPLANT

## 2014-10-19 NOTE — Progress Notes (Signed)
[facility]                                                                              Patient Demographics  Jordan [name], is a 74 y.o. female, DOB - [birth year], [id number]  Admit date - 10/12/2014   Admitting Physician Ivor Costa, MD  Outpatient Primary MD for the patient is Pcp Not In System  LOS - 7   Chief Complaint  Patient presents with  . Respiratory Distress       Brief HPI   Jordan [name] is a 74 y.o. female with PMH of hypertension, diabetes mellitus, GERD, breast cancer, COPD, arthritis, chronic kidney disease (unknown stage), chronic leg edema, who presents with shortness of breath and syncope. Patient comes here from [place] for visiting her daughter. She had been doing fine until 11:30 on night of admission when she started having SOB. She does not have cough and chest pain. Per her daughter, patient did some exertion, then she passed out for few seconds. She did not have seizure. No head injury. Patient reports that she is taking Bumex for chronic leg edema, but denies congestive heart failure. She states that her leg swelling has been worsening recently.  Patient was found to have NSTEMI and is in need of a cath. Cath has been postponed while her kidney function is optimized.   Assessment & Plan    Principal Problem: Acute on chronic hypoxic respiratory failure likely due to NSTEMI and acute on chronic systolic CHF - Progressively improving  Active problems:  Acute on chronic systolic CHF exacerbation: Patient has left bundle branch block of unknown duration, troponin elevation - Echo from 5/16 in [place] shows 55-60%- no wall abnormalities, 2-D echo 6/28 showed EF of 30% with akinesis of inferior septum, akinesis of inferior wall, hypokinesis of anterior septum and akinesis of anterior wall - Not a candidate for ACEI/ARB due to chronic kidney disease - Continue Coreg, nitrates, hydralazine - Cardiac cath today  NSTEMI - 2-D  echo 6/28 showed EF of 30% with akinesis of inferior septum, akinesis of inferior wall, hypokinesis of anterior septum and akinesis of anterior wall -Cardiac cath today  AKI on CKD (chronic kidney disease): baseline appears to be: 3 1/16, underlying stage IV CKD - US-renal- medical renal disease - Nephrology following, creatinine 4.49 today, may need hemodialysis if worsening  COPD:  - Does not seem to have acute exacerbation- wheezing was most likely fluid  DM-II: Insulin-dependent, uncontrolled - continue Levemir 60 units, meal coverage to 10 units 3 times a day, sliding scale insulin  - Hemoglobin A1c: 9.4  Hx of Breast cancer: s/p of L mastectomy, chemotherapy and radiation therapy. Patient has been followed up with oncologist. Last seen was 2 months ago, was told that her breast cancer was stable. -Follow-up with her oncologist in [place].  Hypertension: Improving today -Continue Coreg, nitrates, hydralazine  GERD: -Continue PPI   Left Hip pain has history of osteoarthritis : Feeling better today - hip x ray does not show fracture - CT of the left hip showed moderate osteoarthritis with very small joint effusion, no acute or subacute abnormality - Place on  pain control, increase Neurontin, added oxycodone    obesity -encourage weight loss, diet control  Code Status: full code  Family Communication: Discussed in detail with the patient, all imaging results, lab results explained to the patient    Disposition Plan: Remain in stepdown unit   Time Spent in minutes   25 minutes  Procedures  2d echo Left hip xray Renal US CT of the left hip Cardiac CATH  Consults   Cardiology  Nephrology   DVT Prophylaxis  heparin drip   Medications  Scheduled Meds: . antiseptic oral rinse  7 mL Mouth Rinse BID  . aspirin EC  81 mg Oral Daily  . atorvastatin  40 mg Oral q1800  . carvedilol  25 mg Oral BID WC  . docusate sodium  100 mg Oral BID  . furosemide  80 mg Intravenous  Q12H  . gabapentin  300 mg Oral TID  . heparin  5,000 Units Subcutaneous 3 times per day  . hydrALAZINE  100 mg Oral 3 times per day  . insulin aspart  0-15 Units Subcutaneous TID WC  . insulin aspart  0-5 Units Subcutaneous QHS  . insulin aspart  10 Units Subcutaneous TID WC  . [START ON 10/20/2014] insulin detemir  60 Units Subcutaneous Daily  . isosorbide mononitrate  120 mg Oral Daily  . lidocaine  1 patch Transdermal Q24H  . multivitamin with minerals  1 tablet Oral Daily  . pantoprazole  40 mg Oral Daily  . sodium chloride  3 mL Intravenous Q12H  . sodium chloride  3 mL Intravenous Q12H  . sodium chloride  3 mL Intravenous Q12H   Continuous Infusions:   PRN Meds:.sodium chloride, sodium chloride, sodium chloride, acetaminophen, albuterol, capsaicin, guaiFENesin-codeine, ipratropium-albuterol, ondansetron (ZOFRAN) IV, oxyCODONE, polyethylene glycol, sodium chloride, sodium chloride, sodium chloride   Antibiotics   Anti-infectives    None        Subjective:   Jordan [name] was seen and examined today. Denies any specific complaints, feeling nasal stuffiness today. Otherwise no chest pain or shortness of breath, no fevers or chills. Left hip pain controlled. Patient denies dizziness, abdominal pain, N/V/D/C, new weakness, numbess, tingling.   Objective:   Blood pressure 164/59, pulse 63, temperature 98.5 F (36.9 C), temperature source Oral, resp. rate 21, height 5\' 5"  (1.651 m), weight 142.1 kg (313 lb 4.4 oz), SpO2 100 %.  Wt Readings from Last 3 Encounters:  10/19/14 142.1 kg (313 lb 4.4 oz)     Intake/Output Summary (Last 24 hours) at 10/19/14 1256 Last data filed at 10/19/14 1200  Gross per 24 hour  Intake 2434.76 ml  Output    675 ml  Net 1759.76 ml    Exam  General: Alert and oriented x 3, NAD  HEENT:  PERRLA, EOMI,  Neck: Supple,    CVS: S1 S2clear, no mrg  Respiratory: Clear to auscultation bilaterally  Abdomen:  obese, soft, NT, ND,  NBS  Ext: no cyanosis clubbing, 3+ edema b/l LE   Neuro: no new changes  Skin: No rashes  Psych: Normal affect and demeanor, alert and oriented x3    Data Review   Micro Results Recent Results (from the past 240 hour(s))  MRSA PCR Screening     Status: None   Collection Time: 10/12/14  6:42 AM  Result Value Ref Range Status   MRSA by PCR NEGATIVE NEGATIVE Final    Comment:        The GeneXpert MRSA Assay (FDA approved for NASAL  specimens only), is one component of a comprehensive MRSA colonization surveillance program. It is not intended to diagnose MRSA infection nor to guide or monitor treatment for MRSA infections.     Radiology Reports US Renal  10/12/2014   CLINICAL DATA:  Acute renal insufficiency  EXAM: RENAL / URINARY TRACT ULTRASOUND COMPLETE  COMPARISON:  None.  FINDINGS: Right Kidney:  Length: 11.7 cm.  Mildly increased echogenicity.  No hydronephrosis.  Left Kidney:  Length: 12.1 cm.  Mildly increased echogenicity.  No hydronephrosis.  Bladder:  Appears normal for degree of bladder distention.  IMPRESSION: Negative for hydronephrosis. There is mildly increased renal parenchymal echogenicity consistent with medical renal disease.   Electronically Signed   By: Andreas Newport M.D.   On: 10/12/2014 06:29   Ct Hip Left Wo Contrast  10/16/2014   CLINICAL DATA:  Patient presented 10/12/2014 with shortness of breath and syncope and was found to have a non ST elevation MI. Prior history of left breast cancer post mastectomy. Acute superimposed upon chronic left hip pain. No known injuries.  EXAM: CT OF THE LEFT HIP WITHOUT CONTRAST  TECHNIQUE: Multidetector CT imaging of the left hip was performed according to the standard protocol. Multiplanar CT image reconstructions were also generated.  COMPARISON:  Left hip x-rays 10/13/2014.  FINDINGS: No evidence of acute, subacute or healed fractures. Severe narrowing of the joint space posteriorly with associated hypertrophic  spurring involving the posterior column of the acetabulum. Moderate joint space narrowing elsewhere. Very small joint effusion. No evidence of osseous metastatic disease involving the visualized bones comprising the left hip.  IMPRESSION: 1. No acute or subacute osseous abnormality. 2. Moderate osteoarthritis. 3. Very small joint effusion. 4. No evidence of osseous metastatic disease.   Electronically Signed   By: Evangeline Dakin M.D.   On: 10/16/2014 14:15   Dg Chest Port 1 View  10/15/2014   CLINICAL DATA:  CHF  EXAM: PORTABLE CHEST - 1 VIEW  COMPARISON:  10/12/2014  FINDINGS: Cardiomegaly with mild perihilar edema, improved. Left lung base is obscured, likely a combination of atelectasis and small pleural effusion.  IMPRESSION: Cardiomegaly with mild perihilar edema, improved.  Suspected small left pleural effusion.   Electronically Signed   By: Julian Hy M.D.   On: 10/15/2014 08:53   Dg Chest Portable 1 View  10/12/2014   CLINICAL DATA:  Respiratory distress  EXAM: PORTABLE CHEST - 1 VIEW  COMPARISON:  None.  FINDINGS: Central and basilar airspace opacities are present bilaterally. There is cardiomegaly. There probably is a small left pleural effusion.  IMPRESSION: Extensive airspace opacities and central and basilar distribution. This may represent alveolar edema associated with congestive heart failure. Probable left effusion. Cannot exclude infectious infiltrates.   Electronically Signed   By: Andreas Newport M.D.   On: 10/12/2014 01:51   Dg Hip Unilat With Pelvis 2-3 Views Left  10/13/2014   CLINICAL DATA:  Left hip pain.  EXAM: LEFT HIP (WITH PELVIS) 2-3 VIEWS  COMPARISON:  None.  FINDINGS: The left hip is located. No acute bone or soft tissue abnormality is present. Degenerative changes are noted in the SI joints bilaterally and lower lumbar spine.  IMPRESSION: 1. Normal radiographic appearance of the left hip. 2. Degenerative changes within the lower lumbar spine and SI joints.    Electronically Signed   By: San Morelle M.D.   On: 10/13/2014 09:48    CBC  Recent Labs Lab 10/15/14 0324 10/16/14 0225 10/17/14 0250 10/18/14 0236 10/19/14 0700  WBC 12.9* 9.7 9.5 10.7* 10.5  HGB 9.5* 9.5* 9.2* 8.4* 8.5*  HCT 30.9* 30.7* 29.7* 27.2* 26.6*  PLT 229 208 222 204 204  MCV 82.2 84.3 82.3 82.4 83.1  MCH 25.3* 26.1 25.5* 25.5* 26.6  MCHC 30.7 30.9 31.0 30.9 32.0  RDW 13.8 14.1 13.9 13.9 14.3    Chemistries   Recent Labs Lab 10/15/14 0324 10/16/14 0845 10/17/14 0250 10/18/14 0236 10/19/14 0700  NA 137 135 137 133* 134*  K 4.7 4.6 4.7 4.8 5.1  CL 102 97* 96* 94* 97*  CO2 27 30 31 27 26   GLUCOSE 239* 141* 232* 154* 184*  BUN 71* 73* 72* 80* 90*  CREATININE 4.03* 3.98* 3.99* 4.39* 4.49*  CALCIUM 6.9* 6.9* 7.2* 6.9* 6.8*   ------------------------------------------------------------------------------------------------------------------ estimated creatinine clearance is 16.5 mL/min (by C-G formula based on Cr of 4.49). ------------------------------------------------------------------------------------------------------------------ No results for input(s): HGBA1C in the last 72 hours. ------------------------------------------------------------------------------------------------------------------ No results for input(s): CHOL, HDL, LDLCALC, TRIG, CHOLHDL, LDLDIRECT in the last 72 hours. ------------------------------------------------------------------------------------------------------------------ No results for input(s): TSH, T4TOTAL, T3FREE, THYROIDAB in the last 72 hours.  Invalid input(s): FREET3 ------------------------------------------------------------------------------------------------------------------ No results for input(s): VITAMINB12, FOLATE, FERRITIN, TIBC, IRON, RETICCTPCT in the last 72 hours.  Coagulation profile  Recent Labs Lab 10/15/14 0324  INR 1.09    No results for input(s): DDIMER in the last 72 hours.  Cardiac  Enzymes  Recent Labs Lab 10/12/14 1630  TROPONINI 0.60*   ------------------------------------------------------------------------------------------------------------------ Invalid input(s): POCBNP   Recent Labs  10/17/14 2111 10/18/14 0812 10/18/14 1232 10/18/14 1723 10/18/14 2046 10/19/14 0810  GLUCAP 146* 168* 171* 138* 209* 181*     Jordi Kamm M.D. [facility] 10/19/2014, 12:56 PM  Pager: [phone]   Between 7am to 7pm - call Pager - [phone]  After 7pm go to [email or web address] - password [email or web address]  Call night coverage person covering after 7pm

## 2014-10-19 NOTE — Progress Notes (Signed)
Back from the cath lab buy bed awake and alert. TR band to right wrist in placed, elevated with pillow. Instructed to avoid moving right arm ,and observe for any signs of bleeding.

## 2014-10-19 NOTE — Progress Notes (Signed)
Patient ID: Jordan [name], female   DOB: January 27, 1941, 74 y.o.   MRN: [id number]  [facility] Progress Note    Assessment/ Plan:   1. Acute renal failure on chronic kidney disease stage IV: This appears to be hemodynamically mediated with recent new onset congestive heart failure-renal function also likely affected by diuretic therapy/RAS activation. She is now status post coronary angiogram (with contrast sparing, diuretics held and some gentle intravenous fluids to minimize nephrotoxicity). We'll continue to follow her closely as the risk for her to worsened renal function/need dialysis remains very high. I have ordered for vein mapping to help [place] with access planning should her renal function head that way. 2. Non-ST elevation myocardial infarction: With optimized medical management and now status post coronary angiogram 3. Hypertension: Blood pressure is elevated on current therapy-likely amplified by volume excess, monitor and adjust therapy 4. Volume overload/anasarca: Holding diuretic therapy at this time at least for the next 24 hours prior to restarting (as attempt to limiting nephrotoxicity from contrast) for anasarca management. 5. Anemia: We'll check iron studies today and decide on need for ESA therapy  Subjective:   Reports to be feeling well-denies any chest pain or shortness of breath and expresses some apprehension about possibly needing dialysis in the near future.    Objective:   BP 160/53 mmHg  Pulse 56  Temp(Src) 98.5 F (36.9 C) (Oral)  Resp 4  Ht 5\' 5"  (1.651 m)  Wt 142.1 kg (313 lb 4.4 oz)  BMI 52.13 kg/m2  SpO2 0%  Intake/Output Summary (Last 24 hours) at 10/19/14 1221 Last data filed at 10/19/14 1050  Gross per 24 hour  Intake 2424.76 ml  Output    675 ml  Net 1749.76 ml   Weight change: -0.602 kg (-1 lb 5.2 oz)  Physical Exam: [id number] resting in bed s/p coronary angiogram  CVS: Regular bradycardia, S1 and S2 normal Resp:  Clear to auscultation-no distinct rales or rhonchi Abd: Soft, obese, nontender Ext: 2+ lower extremity edema  Imaging: No results found.  Labs: BMET  Recent Labs Lab 10/13/14 0905 10/14/14 0320 10/15/14 0324 10/16/14 0845 10/17/14 0250 10/18/14 0236 10/19/14 0700  NA 136 134* 137 135 137 133* 134*  K 4.8 4.7 4.7 4.6 4.7 4.8 5.1  CL 99* 96* 102 97* 96* 94* 97*  CO2 28 28 27 30 31 27 26   GLUCOSE 300* 316* 239* 141* 232* 154* 184*  BUN 53* 59* 71* 73* 72* 80* 90*  CREATININE 3.90* 4.14* 4.03* 3.98* 3.99* 4.39* 4.49*  CALCIUM 7.4* 7.0* 6.9* 6.9* 7.2* 6.9* 6.8*   CBC  Recent Labs Lab 10/16/14 0225 10/17/14 0250 10/18/14 0236 10/19/14 0700  WBC 9.7 9.5 10.7* 10.5  HGB 9.5* 9.2* 8.4* 8.5*  HCT 30.7* 29.7* 27.2* 26.6*  MCV 84.3 82.3 82.4 83.1  PLT 208 222 204 204    Medications:    . antiseptic oral rinse  7 mL Mouth Rinse BID  . aspirin EC  81 mg Oral Daily  . atorvastatin  40 mg Oral q1800  . carvedilol  25 mg Oral BID WC  . docusate sodium  100 mg Oral BID  . furosemide  80 mg Intravenous Q12H  . gabapentin  300 mg Oral TID  . heparin  5,000 Units Subcutaneous 3 times per day  . hydrALAZINE  100 mg Oral 3 times per day  . insulin aspart  0-15 Units Subcutaneous TID WC  . insulin aspart  0-5 Units Subcutaneous QHS  . insulin  aspart  10 Units Subcutaneous TID WC  . [START ON 10/20/2014] insulin detemir  60 Units Subcutaneous Daily  . isosorbide mononitrate  120 mg Oral Daily  . lidocaine  1 patch Transdermal Q24H  . multivitamin with minerals  1 tablet Oral Daily  . pantoprazole  40 mg Oral Daily  . sodium chloride  3 mL Intravenous Q12H  . sodium chloride  3 mL Intravenous Q12H  . sodium chloride  3 mL Intravenous Q12H   Elmarie Shiley, MD 10/19/2014, 12:21 PM

## 2014-10-19 NOTE — Progress Notes (Signed)
Patient Name: Jordan [name] Date of Encounter: 10/19/2014  Principal Problem:   SOB (shortness of breath) Active Problems:   Breast cancer   Diabetes mellitus without complication   Hypertension   Arthritis   COPD (chronic obstructive pulmonary disease)   Syncope   CKD (chronic kidney disease)   Acute on chronic respiratory failure   Chest pain with high risk for cardiac etiology   LBBB (left bundle branch block)   Elevated troponin   Acute systolic CHF (congestive heart failure)   NSTEMI (non-ST elevated myocardial infarction)   Pulmonary HTN   AKI (acute kidney injury)   Length of Stay: 7  SUBJECTIVE Breathing has improved. Creatinine up today to 4.49 from 4.39. Weight down to 313.  CURRENT MEDS . antiseptic oral rinse  7 mL Mouth Rinse BID  . aspirin EC  81 mg Oral Daily  . atorvastatin  40 mg Oral q1800  . carvedilol  25 mg Oral BID WC  . docusate sodium  100 mg Oral BID  . gabapentin  300 mg Oral TID  . hydrALAZINE  100 mg Oral 3 times per day  . insulin aspart  0-15 Units Subcutaneous TID WC  . insulin aspart  0-5 Units Subcutaneous QHS  . insulin aspart  10 Units Subcutaneous TID WC  . insulin detemir  60 Units Subcutaneous Daily  . isosorbide mononitrate  120 mg Oral Daily  . lidocaine  1 patch Transdermal Q24H  . multivitamin with minerals  1 tablet Oral Daily  . pantoprazole  40 mg Oral Daily  . sodium chloride  3 mL Intravenous Q12H  . sodium chloride  3 mL Intravenous Q12H    OBJECTIVE   Intake/Output Summary (Last 24 hours) at 10/19/14 0853 Last data filed at 10/19/14 0700  Gross per 24 hour  Intake 2014.76 ml  Output    900 ml  Net 1114.76 ml   Filed Weights   10/17/14 0500 10/18/14 0500 10/19/14 0244  Weight: 318 lb 6.4 oz (144.425 kg) 314 lb 9.6 oz (142.702 kg) 313 lb 4.4 oz (142.1 kg)    PHYSICAL EXAM Filed Vitals:   10/19/14 0244 10/19/14 0400 10/19/14 0600 10/19/14 0800  BP:  147/47 132/102 139/42  Pulse:  67 80 85    Temp:  98.7 F (37.1 C)  98.5 F (36.9 C)  TempSrc:  Oral  Oral  Resp:  13 17 17   Height:      Weight: 313 lb 4.4 oz (142.1 kg)     SpO2:  99% 98% 100%   General: Alert, oriented x3, no distress; morbid obesity limits the exam Head: Normal Neck: hard to evaluate the jugular venous pulsations and no hepatojugular reflux; brisk carotid pulses without delay and no carotid bruits Chest: clear to auscultation, no signs of consolidation by percussion or palpation,symmetrical and full respiratory excursions Cardiovascular:  regular rhythm, normal first and second heart sounds, no rubs or gallops, no murmur Abdomen: no tenderness or distention, no masses by palpation, no abnormal pulsatility or arterial bruits, normal bowel sounds, no hepatosplenomegaly Extremities: no clubbing, cyanosis; lymphedema left upper extremity; 2+ chronic brawny edema of both legs, to level of knees, R>L; 2+ radial, ulnar and brachial pulses bilaterally; no subclavian bruits. Unable to evaluate lower extremity pulses Neurological: grossly nonfocal  LABS  CBC  Recent Labs  10/18/14 0236 10/19/14 0700  WBC 10.7* 10.5  HGB 8.4* 8.5*  HCT 27.2* 26.6*  MCV 82.4 83.1  PLT 204 0000000   Basic Metabolic Panel  Recent Labs  10/18/14 0236 10/19/14 0700  NA 133* 134*  K 4.8 5.1  CL 94* 97*  CO2 27 26  GLUCOSE 154* 184*  BUN 80* 90*  CREATININE 4.39* 4.49*  CALCIUM 6.9* 6.8*    Radiology Studies Imaging results have been reviewed and No results found.  TELE NSR   ASSESSMENT AND PLAN  1. NSTEMI. Troponin elevation with flat curve suggests more of a demand type ischemia due to CHF and hypoxia. She has a LBBB of unknown duration. Echo demonstrates severe LV dysfunction with EF of 30% and regional wall motion abnormalities consistent with ischemic cardiomyopathy. These findings are new compared to Echo she had in [place] in May. She is high risk.   Plan right and left heart cath to define coronary anatomy and  define treatment options- creatinine slowly rising, but seems that plan is to proceed today with minimal contrast dye. If she progresses to dialysis then we could proceed with cath as well.   2. Acute on chronic systolic CHF. EF 30%. I/O negative 5.7L since admission. Weight is down. Not a candidate for ACEi/ARB due to CKD. Continue Coreg, nitrates, and hydralazine. Diuretics on hold for planned cath.  3. CKD stage 4. Baseline creatinine 3.15 on June 8. Creatinine fluctuating 3.65-4.3 this admission. Renal evaluated and signed off yesterday. Creatinine slightly higher today.  4. DM poorly controlled. On insulin. Per primary team.   5. HTN- severe, improved control. On Coreg, hydralazine, and nitrates for now.    6. Hyperlipidemia - continue statins. Elevated AST probably due to hepatic congestion with CHF.   7. COPD. Agree much of her breathing issues more related to CHF and obesity , "cardiac asthma".   8. History of breast CA With left arm lymphedema - do not use for IV or radial access  9. Super Morbid Obesity.  10. Pulmonary HTN - suspect major component of OSA/obesity-hypoventilation, but also left heart failure  11. LBBB - CRT may be an option down the road, if she fails medical management. Obesity increases complexity of device implantation and increases risk of complications.  12. Hip pain - continue lidocaine patches.  Pixie Casino, MD, [facility] Attending Cardiologist [facility]  10/19/2014 8:53 AM

## 2014-10-19 NOTE — Progress Notes (Signed)
ANTICOAGULATION CONSULT NOTE  Pharmacy Consult for heparin Indication: chest pain/ACS Labs:  Recent Labs  10/17/14 0250 10/17/14 1242 10/18/14 0236 10/19/14 0700  HGB 9.2*  --  8.4* 8.5*  HCT 29.7*  --  27.2* 26.6*  PLT 222  --  204 204  HEPARINUNFRC 0.21* 0.37 0.39 0.27*  CREATININE 3.99*  --  4.39* 4.49*    Estimated Creatinine Clearance: 16.5 mL/min (by C-G formula based on Cr of 4.49).  Assessment: 74 yo female with admitted 10/12/2014  with SOB and syncope and possible CHF now noted with increased troponin. Pharmacy has been consulted to dose heparin for ACS. Pt from [place] here visiting daughter  PMH: arthritis, breast CA, CKD, COPD, DM, HTN   Anticoagulation/heme: ACS.  HL just below goal but plan for cath later this morning will continue at current rate for now Hgb low but stable at 8.5, pltc normal, no bleeding noted Hep 1900 u/h    Goal of Therapy:  Heparin level 0.3-0.7 units/ml Monitor platelets by anticoagulation protocol: Yes   Plan:  - Continue heparin 1900 uts/hr -Will follow cath plans -Daily heparin level and CBC  Thank you for allowing pharmacy to be a part of this patients care team.  Erin Hearing PharmD., BCPS Clinical Pharmacist Pager [phone] 10/19/2014 10:00 AM

## 2014-10-19 NOTE — Interval H&P Note (Signed)
History and Physical Interval Note:  10/19/2014 10:45 AM  Jordan [name]  has presented today for surgery, with the diagnosis of unstable   The various methods of treatment have been discussed with the patient and family. After consideration of risks, benefits and other options for treatment, the patient has consented to  Procedure(s): Right/Left Heart Cath and Coronary Angiography ([name]) as a surgical intervention .  The patient's history has been reviewed, patient examined, no change in status, stable for surgery.  I have reviewed the patient's chart and labs.  Questions were answered to the patient's satisfaction.   Cath Lab Visit (complete for each Cath Lab visit)  Clinical Evaluation Leading to the Procedure:   ACS: Yes.    Non-ACS:    Anginal Classification: CCS IV  Anti-ischemic medical therapy: Maximal Therapy (2 or more classes of medications)  Non-Invasive Test Results: No non-invasive testing performed  Prior CABG: No previous CABG        Jordan [name] [facility] 10/19/2014 10:45 AM

## 2014-10-19 NOTE — Progress Notes (Signed)
TR band removed 2x2  Gauze and tegaderm applied. Instructions given regarding right arm restrictions. Continue to monitor.

## 2014-10-19 NOTE — H&P (View-Only) (Signed)
Patient Name: Jordan [name] Date of Encounter: 10/19/2014  Principal Problem:   SOB (shortness of breath) Active Problems:   Breast cancer   Diabetes mellitus without complication   Hypertension   Arthritis   COPD (chronic obstructive pulmonary disease)   Syncope   CKD (chronic kidney disease)   Acute on chronic respiratory failure   Chest pain with high risk for cardiac etiology   LBBB (left bundle branch block)   Elevated troponin   Acute systolic CHF (congestive heart failure)   NSTEMI (non-ST elevated myocardial infarction)   Pulmonary HTN   AKI (acute kidney injury)   Length of Stay: 7  SUBJECTIVE Breathing has improved. Creatinine up today to 4.49 from 4.39. Weight down to 313.  CURRENT MEDS . antiseptic oral rinse  7 mL Mouth Rinse BID  . aspirin EC  81 mg Oral Daily  . atorvastatin  40 mg Oral q1800  . carvedilol  25 mg Oral BID WC  . docusate sodium  100 mg Oral BID  . gabapentin  300 mg Oral TID  . hydrALAZINE  100 mg Oral 3 times per day  . insulin aspart  0-15 Units Subcutaneous TID WC  . insulin aspart  0-5 Units Subcutaneous QHS  . insulin aspart  10 Units Subcutaneous TID WC  . insulin detemir  60 Units Subcutaneous Daily  . isosorbide mononitrate  120 mg Oral Daily  . lidocaine  1 patch Transdermal Q24H  . multivitamin with minerals  1 tablet Oral Daily  . pantoprazole  40 mg Oral Daily  . sodium chloride  3 mL Intravenous Q12H  . sodium chloride  3 mL Intravenous Q12H    OBJECTIVE   Intake/Output Summary (Last 24 hours) at 10/19/14 0853 Last data filed at 10/19/14 0700  Gross per 24 hour  Intake 2014.76 ml  Output    900 ml  Net 1114.76 ml   Filed Weights   10/17/14 0500 10/18/14 0500 10/19/14 0244  Weight: 318 lb 6.4 oz (144.425 kg) 314 lb 9.6 oz (142.702 kg) 313 lb 4.4 oz (142.1 kg)    PHYSICAL EXAM Filed Vitals:   10/19/14 0244 10/19/14 0400 10/19/14 0600 10/19/14 0800  BP:  147/47 132/102 139/42  Pulse:  67 80 85    Temp:  98.7 F (37.1 C)  98.5 F (36.9 C)  TempSrc:  Oral  Oral  Resp:  13 17 17   Height:      Weight: 313 lb 4.4 oz (142.1 kg)     SpO2:  99% 98% 100%   General: Alert, oriented x3, no distress; morbid obesity limits the exam Head: Normal Neck: hard to evaluate the jugular venous pulsations and no hepatojugular reflux; brisk carotid pulses without delay and no carotid bruits Chest: clear to auscultation, no signs of consolidation by percussion or palpation,symmetrical and full respiratory excursions Cardiovascular:  regular rhythm, normal first and second heart sounds, no rubs or gallops, no murmur Abdomen: no tenderness or distention, no masses by palpation, no abnormal pulsatility or arterial bruits, normal bowel sounds, no hepatosplenomegaly Extremities: no clubbing, cyanosis; lymphedema left upper extremity; 2+ chronic brawny edema of both legs, to level of knees, R>L; 2+ radial, ulnar and brachial pulses bilaterally; no subclavian bruits. Unable to evaluate lower extremity pulses Neurological: grossly nonfocal  LABS  CBC  Recent Labs  10/18/14 0236 10/19/14 0700  WBC 10.7* 10.5  HGB 8.4* 8.5*  HCT 27.2* 26.6*  MCV 82.4 83.1  PLT 204 0000000   Basic Metabolic Panel  Recent Labs  10/18/14 0236 10/19/14 0700  NA 133* 134*  K 4.8 5.1  CL 94* 97*  CO2 27 26  GLUCOSE 154* 184*  BUN 80* 90*  CREATININE 4.39* 4.49*  CALCIUM 6.9* 6.8*    Radiology Studies Imaging results have been reviewed and No results found.  TELE NSR   ASSESSMENT AND PLAN  1. NSTEMI. Troponin elevation with flat curve suggests more of a demand type ischemia due to CHF and hypoxia. She has a LBBB of unknown duration. Echo demonstrates severe LV dysfunction with EF of 30% and regional wall motion abnormalities consistent with ischemic cardiomyopathy. These findings are new compared to Echo she had in [place] in May. She is high risk.   Plan right and left heart cath to define coronary anatomy and  define treatment options- creatinine slowly rising, but seems that plan is to proceed today with minimal contrast dye. If she progresses to dialysis then we could proceed with cath as well.   2. Acute on chronic systolic CHF. EF 30%. I/O negative 5.7L since admission. Weight is down. Not a candidate for ACEi/ARB due to CKD. Continue Coreg, nitrates, and hydralazine. Diuretics on hold for planned cath.  3. CKD stage 4. Baseline creatinine 3.15 on June 8. Creatinine fluctuating 3.65-4.3 this admission. Renal evaluated and signed off yesterday. Creatinine slightly higher today.  4. DM poorly controlled. On insulin. Per primary team.   5. HTN- severe, improved control. On Coreg, hydralazine, and nitrates for now.    6. Hyperlipidemia - continue statins. Elevated AST probably due to hepatic congestion with CHF.   7. COPD. Agree much of her breathing issues more related to CHF and obesity , "cardiac asthma".   8. History of breast CA With left arm lymphedema - do not use for IV or radial access  9. Super Morbid Obesity.  10. Pulmonary HTN - suspect major component of OSA/obesity-hypoventilation, but also left heart failure  11. LBBB - CRT may be an option down the road, if she fails medical management. Obesity increases complexity of device implantation and increases risk of complications.  12. Hip pain - continue lidocaine patches.  Pixie Casino, MD, [facility] Attending Cardiologist [facility]  10/19/2014 8:53 AM

## 2014-10-19 NOTE — Progress Notes (Signed)
OT Cancellation Note  Patient Details Name: Jordan [name] MRN: [id number] DOB: 27-Jul-1940   Cancelled Treatment:    Reason Eval/Treat Not Completed: Patient at procedure or test/ unavailable Cardiac Cath. Will attempt tomorrow.  [place], OTR/L  [id number] 10/19/2014 10/19/2014, 11:31 AM

## 2014-10-20 ENCOUNTER — Ambulatory Visit (HOSPITAL_COMMUNITY): Payer: Medicare (Managed Care)

## 2014-10-20 ENCOUNTER — Encounter (HOSPITAL_COMMUNITY): Payer: Self-pay | Admitting: Physician Assistant

## 2014-10-20 DIAGNOSIS — N184 Chronic kidney disease, stage 4 (severe): Secondary | ICD-10-CM

## 2014-10-20 DIAGNOSIS — M25552 Pain in left hip: Secondary | ICD-10-CM | POA: Insufficient documentation

## 2014-10-20 LAB — BASIC METABOLIC PANEL
ANION GAP: 9 (ref 5–15)
Anion gap: 9 (ref 5–15)
BUN: 92 mg/dL — ABNORMAL HIGH (ref 6–20)
BUN: 95 mg/dL — ABNORMAL HIGH (ref 6–20)
CHLORIDE: 98 mmol/L — AB (ref 101–111)
CO2: 27 mmol/L (ref 22–32)
CO2: 28 mmol/L (ref 22–32)
CREATININE: 4.79 mg/dL — AB (ref 0.44–1.00)
Calcium: 7 mg/dL — ABNORMAL LOW (ref 8.9–10.3)
Calcium: 6.9 mg/dL — ABNORMAL LOW (ref 8.9–10.3)
Chloride: 97 mmol/L — ABNORMAL LOW (ref 101–111)
Creatinine, Ser: 4.96 mg/dL — ABNORMAL HIGH (ref 0.44–1.00)
GFR calc Af Amer: 10 mL/min — ABNORMAL LOW (ref >60)
GFR calc non Af Amer: 8 mL/min — ABNORMAL LOW (ref >60)
GFR, EST AFRICAN AMERICAN: 9 mL/min — AB (ref >60)
GFR, EST NON AFRICAN AMERICAN: 8 mL/min — AB (ref >60)
GLUCOSE: 202 mg/dL — AB (ref 65–99)
Glucose, Bld: 200 mg/dL — ABNORMAL HIGH (ref 65–99)
Potassium: 5.9 mmol/L — ABNORMAL HIGH (ref 3.5–5.1)
Potassium: 5.1 mmol/L (ref 3.5–5.1)
SODIUM: 134 mmol/L — AB (ref 135–145)
Sodium: 134 mmol/L — ABNORMAL LOW (ref 135–145)

## 2014-10-20 LAB — GLUCOSE, CAPILLARY
GLUCOSE-CAPILLARY: 162 mg/dL — AB (ref 65–99)
Glucose-Capillary: 180 mg/dL — ABNORMAL HIGH (ref 65–99)
Glucose-Capillary: 187 mg/dL — ABNORMAL HIGH (ref 65–99)
Glucose-Capillary: 99 mg/dL (ref 65–99)

## 2014-10-20 LAB — CBC
HEMATOCRIT: 25.6 % — AB (ref 36.0–46.0)
HEMOGLOBIN: 7.8 g/dL — AB (ref 12.0–15.0)
MCH: 25.7 pg — AB (ref 26.0–34.0)
MCHC: 30.5 g/dL (ref 30.0–36.0)
MCV: 84.2 fL (ref 78.0–100.0)
Platelets: 199 10*3/uL (ref 150–400)
RBC: 3.04 MIL/uL — ABNORMAL LOW (ref 3.87–5.11)
RDW: 14.7 % (ref 11.5–15.5)
WBC: 9.1 10*3/uL (ref 4.0–10.5)

## 2014-10-20 MED ORDER — DARBEPOETIN ALFA 60 MCG/0.3ML IJ SOSY
60.0000 ug | PREFILLED_SYRINGE | INTRAMUSCULAR | Status: DC
  Administered 2014-10-20: 60 ug via SUBCUTANEOUS
  Filled 2014-10-20: qty 0.3

## 2014-10-20 MED ORDER — GABAPENTIN 300 MG PO CAPS
300.0000 mg | ORAL_CAPSULE | Freq: Two times a day (BID) | ORAL | Status: DC
  Administered 2014-10-20 – 2014-10-22 (×4): 300 mg via ORAL
  Filled 2014-10-20 (×6): qty 1

## 2014-10-20 MED ORDER — HEPARIN SODIUM (PORCINE) 5000 UNIT/ML IJ SOLN
5000.0000 [IU] | Freq: Three times a day (TID) | INTRAMUSCULAR | Status: DC
  Administered 2014-10-20 – 2014-10-27 (×17): 5000 [IU] via SUBCUTANEOUS
  Filled 2014-10-20 (×21): qty 1

## 2014-10-20 MED ORDER — SODIUM POLYSTYRENE SULFONATE 15 GM/60ML PO SUSP
45.0000 g | Freq: Once | ORAL | Status: AC
  Administered 2014-10-20: 45 g via ORAL
  Filled 2014-10-20: qty 180

## 2014-10-20 NOTE — Progress Notes (Signed)
Pt transported off unit to vascular for procedure. Francis Gaines Manasa Spease RN.

## 2014-10-20 NOTE — Progress Notes (Signed)
Patient ID: Jordan [name], female   DOB: 1941-01-12, 74 y.o.   MRN: [id number]  [facility] Progress Note    Assessment/ Plan:   1. Acute renal failure on chronic kidney disease stage IV: This appears to be hemodynamically mediated with recent new onset congestive heart failure-renal function also likely affected by diuretic therapy/RAS activation. With evidence of contrast induced nephropathy and worsening of renal function, however, no acute HD needs. Remains at high risk for needing HD in near future 2. Non-ST elevation myocardial infarction: With optimized medical management and now status post coronary angiogram 3. Hypertension: Blood pressure is acceptable on current therapy 4. Volume overload/anasarca: Holding diuretic therapy at this time at least for the next 24 hours prior to restarting (as attempt to limiting nephrotoxicity from contrast) for anasarca management. 5. Anemia: Iron stores appear adequate, give aranesp 6. Hyperkalemia: give kayexalate and recheck labs today at 2pm-- plan for HD catheter tomorrow if continues to worsen  Subjective:   Reports to be feeling well-had a comfortable night. Denies any CP/SOB.   Objective:   BP 138/45 mmHg  Pulse 82  Temp(Src) 98.1 F (36.7 C) (Oral)  Resp 19  Ht 5\' 5"  (1.651 m)  Wt 151.637 kg (334 lb 4.8 oz)  BMI 55.63 kg/m2  SpO2 99%  Intake/Output Summary (Last 24 hours) at 10/20/14 0818 Last data filed at 10/20/14 0600  Gross per 24 hour  Intake    737 ml  Output    775 ml  Net    -38 ml   Weight change: 9.537 kg (21 lb 0.4 oz)  Physical Exam: [id number] resting in recliner  CVS: Regular bradycardia, S1 and S2 normal Resp: Clear to auscultation-no distinct rales or rhonchi Abd: Soft, obese, nontender Ext: 2+ lower extremity edema  Imaging: No results found.  Labs: BMET  Recent Labs Lab 10/14/14 0320 10/15/14 0324 10/16/14 0845 10/17/14 0250 10/18/14 0236 10/19/14 0700  10/20/14 0545  NA 134* 137 135 137 133* 134* 134*  K 4.7 4.7 4.6 4.7 4.8 5.1 5.9*  CL 96* 102 97* 96* 94* 97* 98*  CO2 28 27 30 31 27 26 27   GLUCOSE 316* 239* 141* 232* 154* 184* 202*  BUN 59* 71* 73* 72* 80* 90* 92*  CREATININE 4.14* 4.03* 3.98* 3.99* 4.39* 4.49* 4.79*  CALCIUM 7.0* 6.9* 6.9* 7.2* 6.9* 6.8* 7.0*   CBC  Recent Labs Lab 10/17/14 0250 10/18/14 0236 10/19/14 0700 10/20/14 0545  WBC 9.5 10.7* 10.5 9.1  HGB 9.2* 8.4* 8.5* 7.8*  HCT 29.7* 27.2* 26.6* 25.6*  MCV 82.3 82.4 83.1 84.2  PLT 222 204 204 199    Medications:    . antiseptic oral rinse  7 mL Mouth Rinse BID  . aspirin EC  81 mg Oral Daily  . atorvastatin  40 mg Oral q1800  . carvedilol  25 mg Oral BID WC  . docusate sodium  100 mg Oral BID  . furosemide  80 mg Intravenous Q12H  . gabapentin  300 mg Oral TID  . heparin  5,000 Units Subcutaneous 3 times per day  . hydrALAZINE  100 mg Oral 3 times per day  . insulin aspart  0-15 Units Subcutaneous TID WC  . insulin aspart  0-5 Units Subcutaneous QHS  . insulin aspart  10 Units Subcutaneous TID WC  . insulin detemir  60 Units Subcutaneous Daily  . isosorbide mononitrate  120 mg Oral Daily  . lidocaine  1 patch Transdermal Q24H  . multivitamin with minerals  1 tablet Oral Daily  . pantoprazole  40 mg Oral Daily  . sodium chloride  3 mL Intravenous Q12H  . sodium chloride  3 mL Intravenous Q12H  . sodium chloride  3 mL Intravenous Q12H  . sodium polystyrene  45 g Oral Once   Elmarie Shiley, MD 10/20/2014, 8:18 AM

## 2014-10-20 NOTE — Progress Notes (Signed)
Patient Name: Jordan [name] Date of Encounter: 10/20/2014  Principal Problem:   SOB (shortness of breath) Active Problems:   Breast cancer   Diabetes mellitus without complication   Hypertension   Arthritis   COPD (chronic obstructive pulmonary disease)   Syncope   CKD (chronic kidney disease)   Acute on chronic respiratory failure   Chest pain with high risk for cardiac etiology   LBBB (left bundle branch block)   Elevated troponin   Acute systolic CHF (congestive heart failure)   NSTEMI (non-ST elevated myocardial infarction)   Pulmonary HTN   AKI (acute kidney injury)   Length of Stay: 8  SUBJECTIVE Cath yesterday showed no significant CAD.  Creatinine up today to 4.79 - per nephrology, holding diuretics due to risk of contrast nephropathy - by RHC findings, she remains volume overloaded. Suspect she will need HD or at least UF tomorrow. Appreciate Renal assistance.  CURRENT MEDS . antiseptic oral rinse  7 mL Mouth Rinse BID  . aspirin EC  81 mg Oral Daily  . atorvastatin  40 mg Oral q1800  . carvedilol  25 mg Oral BID WC  . darbepoetin (ARANESP) injection - NON-DIALYSIS  60 mcg Subcutaneous Q Wed-1800  . docusate sodium  100 mg Oral BID  . furosemide  80 mg Intravenous Q12H  . gabapentin  300 mg Oral BID  . heparin  5,000 Units Subcutaneous 3 times per day  . hydrALAZINE  100 mg Oral 3 times per day  . insulin aspart  0-15 Units Subcutaneous TID WC  . insulin aspart  0-5 Units Subcutaneous QHS  . insulin aspart  10 Units Subcutaneous TID WC  . insulin detemir  60 Units Subcutaneous Daily  . isosorbide mononitrate  120 mg Oral Daily  . lidocaine  1 patch Transdermal Q24H  . multivitamin with minerals  1 tablet Oral Daily  . pantoprazole  40 mg Oral Daily  . sodium chloride  3 mL Intravenous Q12H  . sodium chloride  3 mL Intravenous Q12H  . sodium chloride  3 mL Intravenous Q12H  . sodium polystyrene  45 g Oral Once     OBJECTIVE   Intake/Output Summary (Last 24 hours) at 10/20/14 0913 Last data filed at 10/20/14 0900  Gross per 24 hour  Intake    815 ml  Output    775 ml  Net     40 ml   Filed Weights   10/18/14 0500 10/19/14 0244 10/20/14 0244  Weight: 314 lb 9.6 oz (142.702 kg) 313 lb 4.4 oz (142.1 kg) 334 lb 4.8 oz (151.637 kg)    PHYSICAL EXAM Filed Vitals:   10/20/14 0400 10/20/14 0500 10/20/14 0600 10/20/14 0700  BP: 154/34 164/15 133/39 138/45  Pulse: 80 77 81 82  Temp: 98.2 F (36.8 C)   98.1 F (36.7 C)  TempSrc: Oral   Oral  Resp: 12 13 11 19   Height:      Weight:      SpO2: 100% 99% 99% 99%   General: Alert, oriented x3, no distress; morbid obesity limits the exam Head: Normal Neck: hard to evaluate the jugular venous pulsations and no hepatojugular reflux; brisk carotid pulses without delay and no carotid bruits Chest: clear to auscultation, no signs of consolidation by percussion or palpation,symmetrical and full respiratory excursions Cardiovascular:  regular rhythm, normal first and second heart sounds, no rubs or gallops, no murmur Abdomen: no tenderness or distention, no masses by palpation, no abnormal pulsatility or arterial bruits,  normal bowel sounds, no hepatosplenomegaly Extremities: no clubbing, cyanosis; lymphedema left upper extremity; 2+ chronic brawny edema of both legs, to level of knees, R>L; 2+ radial, ulnar and brachial pulses bilaterally; no subclavian bruits. Unable to evaluate lower extremity pulses Neurological: grossly nonfocal  LABS  CBC  Recent Labs  10/19/14 0700 10/20/14 0545  WBC 10.5 9.1  HGB 8.5* 7.8*  HCT 26.6* 25.6*  MCV 83.1 84.2  PLT 204 [id number]   Basic Metabolic Panel  Recent Labs  10/19/14 0700 10/20/14 0545  NA 134* 134*  K 5.1 5.9*  CL 97* 98*  CO2 26 27  GLUCOSE 184* 202*  BUN 90* 92*  CREATININE 4.49* 4.79*  CALCIUM 6.8* 7.0*    Radiology Studies Imaging results have been reviewed and No results  found.  TELE NSR   ASSESSMENT AND PLAN  1. NSTEMI. Troponin elevation with flat curve suggests more of a demand type ischemia due to CHF and hypoxia. She has a LBBB of unknown duration. Echo demonstrates severe LV dysfunction with EF of 30% and regional wall motion abnormalities. Cath showed minimal CAD.  Plan right and left heart cath to define coronary anatomy and define treatment options- creatinine continues to rise - will need UF based on being volume overloaded by RHC.   2. Acute on chronic systolic CHF. EF 30%. I/O negative 5.7L since admission. Weight is now trending up.  Will need help with ultrafiltration to remove volume. She is on hydralazine and nitrates.   3. CKD stage 4. Baseline creatinine 3.15 on June 8. Creatinine now rising. Renal continues to evaluate for dialysis. Creatinine slightly higher today, may need HD with UF tomorrow.  4. DM poorly controlled. On insulin. Per primary team.   5. HTN- severe, improved control. On Coreg, hydralazine, and nitrates for now.    6. Hyperlipidemia - continue statins. Elevated AST probably due to hepatic congestion with CHF.   7. COPD. Agree much of her breathing issues more related to CHF and obesity , "cardiac asthma".   8. History of breast CA With left arm lymphedema - do not use for IV or radial access  9. Super Morbid Obesity.  10. Pulmonary HTN - suspect major component of OSA/obesity-hypoventilation, but also left heart failure  11. LBBB - CRT may be an option down the road, if she fails medical management. Obesity increases complexity of device implantation and increases risk of complications.  12. Hip pain - continue lidocaine patches.  Pixie Casino, MD, [facility] Attending Cardiologist [facility]  10/20/2014 9:13 AM

## 2014-10-20 NOTE — H&P (Signed)
Chief Complaint: Chief Complaint  Patient presents with  . Respiratory Distress    Referring Physician(s): Graylon Gunning  History of Present Illness: Jordan [name] is a 74 y.o. female who presented to the hospital complaining of increasing SOB.  She was found to have acute on chronic renal failure.  She now needs dialysis  We are asked to evaluate her for placement of tunneled dialysis catheter (permacath)  She has h/o Port A Cath on the right from history of breast cancer.  Past Medical History  Diagnosis Date  . Breast cancer   . Diabetes mellitus without complication   . Hypertension   . Arthritis   . COPD (chronic obstructive pulmonary disease)   . Renal disorder     Family reports acute renal failure  . GERD (gastroesophageal reflux disease)   . CKD (chronic kidney disease)     Past Surgical History  Procedure Laterality Date  . Breast surgery Left   . Cardiac catheterization [name] 10/19/2014    Procedure: Right/Left Heart Cath and Coronary Angiography;  Surgeon: Peter M Martinique, MD;  Location: [facility];  Service: Cardiovascular;  Laterality: [name];    Allergies: Review of patient's allergies indicates no known allergies.  Medications: Prior to Admission medications   Medication Sig Start Date End Date Taking? Authorizing Provider  bumetanide (BUMEX) 2 MG tablet Take 2 mg by mouth 2 (two) times daily.   Yes Historical Provider, MD  capsaicin (ZOSTRIX) 0.025 % cream Apply 1 application topically 2 (two) times daily as needed (pain).   Yes Historical Provider, MD  carvedilol (COREG) 6.25 MG tablet Take 6.25 mg by mouth 2 (two) times daily with a meal.   Yes Historical Provider, MD  cloNIDine (CATAPRES) 0.2 MG tablet Take 0.2 mg by mouth 3 (three) times daily.   Yes Historical Provider, MD  Dextromethorphan Polistirex (DELSYM PO) Take 10 mLs by mouth every 6 (six) hours as needed (cough).   Yes Historical Provider, MD  gabapentin (NEURONTIN) 300 MG  capsule Take 300 mg by mouth 2 (two) times daily.   Yes Historical Provider, MD  guaiFENesin-codeine (ROBITUSSIN AC) 100-10 MG/5ML syrup Take 10 mLs by mouth every 6 (six) hours as needed for cough.   Yes Historical Provider, MD  HYDROcodone-acetaminophen (NORCO/VICODIN) 5-325 MG per tablet Take 1 tablet by mouth every 6 (six) hours as needed for moderate pain.   Yes Historical Provider, MD  Insulin Detemir (LEVEMIR FLEXPEN) 100 UNIT/ML Pen Inject 70 Units into the skin every morning.   Yes Historical Provider, MD  insulin lispro (HUMALOG KWIKPEN) 100 UNIT/ML KiwkPen Inject 6-12 Units into the skin 3 (three) times daily as needed (blood sugar).   Yes Historical Provider, MD  Multiple Vitamins-Minerals (CENTRUM SILVER ADULT 50+ PO) Take 1 tablet by mouth daily.   Yes Historical Provider, MD  omeprazole (PRILOSEC) 40 MG capsule Take 40 mg by mouth daily.   Yes Historical Provider, MD  OVER THE COUNTER MEDICATION Place 2-3 tablets under the tongue every 4 (four) hours as needed (restful legs).   Yes Historical Provider, MD  promethazine (PHENERGAN) 12.5 MG tablet Take 12.5 mg by mouth every 6 (six) hours as needed for nausea or vomiting.   Yes Historical Provider, MD  Tiotropium Bromide-Olodaterol (STIOLTO RESPIMAT) 2.5-2.5 MCG/ACT AERS Inhale 2 puffs into the lungs 2 (two) times daily as needed (shortness of breath).    Yes Historical Provider, MD     Family History  Problem Relation Age of Onset  .  Diabetes Mother   . Hypertension Mother   . Stroke Sister   . Hypertension Sister     History   Social History  . Marital Status: Single    Spouse Name: [name]  . Number of Children: [name]  . Years of Education: [name]   Social History Main Topics  . Smoking status: Never Smoker   . Smokeless tobacco: Not on file  . Alcohol Use: No  . Drug Use: Not on file  . Sexual Activity: Not on file   Other Topics Concern  . None   Social History Narrative     Review of Systems  Constitutional:  Positive for activity change. Negative for fever, chills and appetite change.  Respiratory: Positive for shortness of breath. Negative for cough and chest tightness.   Cardiovascular: Negative for chest pain.  Gastrointestinal: Negative for nausea, vomiting and abdominal pain.  Musculoskeletal: Negative.   Skin: Negative.   Neurological: Negative.   Psychiatric/Behavioral: Negative.     Vital Signs: BP 121/57 mmHg  Pulse 64  Temp(Src) 99 F (37.2 C) (Oral)  Resp 18  Ht 5\' 5"  (1.651 m)  Wt 334 lb 4.8 oz (151.637 kg)  BMI 55.63 kg/m2  SpO2 100%  Physical Exam  Constitutional: She is oriented to person, place, and time.  Morbidly obese  HENT:  Head: Normocephalic and atraumatic.  Eyes: EOM are normal.  Neck: Normal range of motion. Neck supple.  Cardiovascular: Normal rate, regular rhythm and normal heart sounds.   Pulmonary/Chest: Effort normal.  Diminished breath sounds bilaterally  Abdominal: Soft.  Obese  Musculoskeletal: Normal range of motion.  Neurological: She is alert and oriented to person, place, and time.  Skin: Skin is warm and dry.  Psychiatric: She has a normal mood and affect. Her behavior is normal. Judgment and thought content normal.  Vitals reviewed.   Mallampati Score:  MD Evaluation Airway: WNL Heart: WNL Abdomen: WNL Chest/ Lungs: Other (comments) Chest/ lungs comments: Breath sounds diminshed bilaterally ASA  Classification: 3 Mallampati/Airway Score: Two  Imaging: US Renal  10/12/2014   CLINICAL DATA:  Acute renal insufficiency  EXAM: RENAL / URINARY TRACT ULTRASOUND COMPLETE  COMPARISON:  None.  FINDINGS: Right Kidney:  Length: 11.7 cm.  Mildly increased echogenicity.  No hydronephrosis.  Left Kidney:  Length: 12.1 cm.  Mildly increased echogenicity.  No hydronephrosis.  Bladder:  Appears normal for degree of bladder distention.  IMPRESSION: Negative for hydronephrosis. There is mildly increased renal parenchymal echogenicity consistent with  medical renal disease.   Electronically Signed   By: Andreas Newport M.D.   On: 10/12/2014 06:29   Ct Hip Left Wo Contrast  10/16/2014   CLINICAL DATA:  Patient presented 10/12/2014 with shortness of breath and syncope and was found to have a non ST elevation MI. Prior history of left breast cancer post mastectomy. Acute superimposed upon chronic left hip pain. No known injuries.  EXAM: CT OF THE LEFT HIP WITHOUT CONTRAST  TECHNIQUE: Multidetector CT imaging of the left hip was performed according to the standard protocol. Multiplanar CT image reconstructions were also generated.  COMPARISON:  Left hip x-rays 10/13/2014.  FINDINGS: No evidence of acute, subacute or healed fractures. Severe narrowing of the joint space posteriorly with associated hypertrophic spurring involving the posterior column of the acetabulum. Moderate joint space narrowing elsewhere. Very small joint effusion. No evidence of osseous metastatic disease involving the visualized bones comprising the left hip.  IMPRESSION: 1. No acute or subacute osseous abnormality. 2. Moderate osteoarthritis. 3.  Very small joint effusion. 4. No evidence of osseous metastatic disease.   Electronically Signed   By: Evangeline Dakin M.D.   On: 10/16/2014 14:15   Dg Chest Port 1 View  10/15/2014   CLINICAL DATA:  CHF  EXAM: PORTABLE CHEST - 1 VIEW  COMPARISON:  10/12/2014  FINDINGS: Cardiomegaly with mild perihilar edema, improved. Left lung base is obscured, likely a combination of atelectasis and small pleural effusion.  IMPRESSION: Cardiomegaly with mild perihilar edema, improved.  Suspected small left pleural effusion.   Electronically Signed   By: Julian Hy M.D.   On: 10/15/2014 08:53   Dg Chest Portable 1 View  10/12/2014   CLINICAL DATA:  Respiratory distress  EXAM: PORTABLE CHEST - 1 VIEW  COMPARISON:  None.  FINDINGS: Central and basilar airspace opacities are present bilaterally. There is cardiomegaly. There probably is a small left  pleural effusion.  IMPRESSION: Extensive airspace opacities and central and basilar distribution. This may represent alveolar edema associated with congestive heart failure. Probable left effusion. Cannot exclude infectious infiltrates.   Electronically Signed   By: Andreas Newport M.D.   On: 10/12/2014 01:51   Dg Hip Unilat With Pelvis 2-3 Views Left  10/13/2014   CLINICAL DATA:  Left hip pain.  EXAM: LEFT HIP (WITH PELVIS) 2-3 VIEWS  COMPARISON:  None.  FINDINGS: The left hip is located. No acute bone or soft tissue abnormality is present. Degenerative changes are noted in the SI joints bilaterally and lower lumbar spine.  IMPRESSION: 1. Normal radiographic appearance of the left hip. 2. Degenerative changes within the lower lumbar spine and SI joints.   Electronically Signed   By: San Morelle M.D.   On: 10/13/2014 09:48    Labs:  CBC:  Recent Labs  10/17/14 0250 10/18/14 0236 10/19/14 0700 10/20/14 0545  WBC 9.5 10.7* 10.5 9.1  HGB 9.2* 8.4* 8.5* 7.8*  HCT 29.7* 27.2* 26.6* 25.6*  PLT 222 204 204 199    COAGS:  Recent Labs  10/12/14 0745 10/15/14 0324  INR 1.06 1.09  APTT 33  --     BMP:  Recent Labs  10/18/14 0236 10/19/14 0700 10/20/14 0545 10/20/14 1426  NA 133* 134* 134* 134*  K 4.8 5.1 5.9* 5.1  CL 94* 97* 98* 97*  CO2 27 26 27 28   GLUCOSE 154* 184* 202* 200*  BUN 80* 90* 92* 95*  CALCIUM 6.9* 6.8* 7.0* 6.9*  CREATININE 4.39* 4.49* 4.79* 4.96*  GFRNONAA 9* 9* 8* 8*  GFRAA 11* 10* 10* 9*    LIVER FUNCTION TESTS:  Recent Labs  10/12/14 0100  BILITOT 0.2*  AST 59*  ALT 37  ALKPHOS 101  PROT 6.9  ALBUMIN 3.1*    TUMOR MARKERS: No results for input(s): AFPTM, CEA, CA199, CHROMGRNA in the last 8760 hours.  Assessment and Plan:  Acute on Chronic Renal failure with need for dialysis access.  Will plan to place permacath tomorrow.  Will hold am heparin and NPO after midnight  Thank you for this interesting consult.  I greatly  enjoyed meeting Jordan [name] and look forward to participating in their care.  Signed: Murrell Redden PA-C 10/20/2014, 3:43 PM   I spent a total of 20 Minutes    in face to face in clinical consultation, greater than 50% of which was counseling/coordinating care for placement of permacath.

## 2014-10-20 NOTE — Progress Notes (Signed)
Report called to 3E.  Will transfer shortly.

## 2014-10-20 NOTE — Progress Notes (Signed)
Brief HPI    74 y.o.? fropm [place] visiting family with PMH of hypertension, diabetes mellitus ty II, GERD, breast cancer s/p CHemo-XRT, COPD, arthritis, chronic kidney disease (unknown stage), chronic leg edema--she states this had been going on for 4-5 yrs OPTA [not worsened by chemo-XRT]  Admitted 10/12/14 c  shortness of breath and syncope. She had been doing fine until 11:30 on night of admission when she started having SOB. -cough and - chest pain.  Per her daughter, patient did some exertion, then she passed out for few seconds-LOC witnessed by daughter O2 sat 60% on arrival She did not have seizure.  No head injury.  On Bumex for chronic leg edema, but denies congestive heart failure.  She states that her leg swelling has been worsening recently.   BNP of 143, initial troponin was negative, afebrile, slight bradycardia, elevated creatinine at 3.60 (baseline unknown) and BUN at 42. ABG showed pH 7.206, PCO2 64.1, PO2 252. Chest x-ray showed alvola edema. U/A negative. Renal ultrasound negative for hydronephrosis. She was admitted by Internal Medicine. 2D echo is pending and her 2nd and 3rd troponins both returned elevated at 0.51 and 0.58. EKG shows SR with LBBB (no prior EKGs to compare to).  Patient was found to have NSTEMI and is in need of a cath.  Cath has been postponed while her kidney function is optimized.   Assessment & Plan    Principal Problem: Acute on chronic hypoxic respiratory failure likely due to NSTEMI and acute on chronic systolic CHF - Progressively improving  Active problems:  Acute on chronic systolic CHF exacerbation:  Patient has left bundle branch block of unknown duration, troponin elevation - Echo from 5/16 in [place] shows 55-60%- no wall abnormalities, 2-D echo 6/28 showed EF of 30% with akinesis of inferior septum, akinesis of inferior wall, hypokinesis of anterior septum and akinesis of anterior wall - Not a candidate for ACEI/ARB due  to chronic kidney disease - Continue Coreg, nitrates, hydralazine -i/o = -2.9 so far  NSTEMI - 2-D echo 6/28 showed EF of 30% with akinesis of inferior septum, akinesis of inferior wall, hypokinesis of anterior septum and akinesis of anterior wall -Cardiac cath 10/19/14  Mild nonobstructive CAD  Severe pulmonary HTN  Elevated LV filling pressures.  Normal cardiac output  AKI on CKD (chronic kidney disease):  -baseline appears to be: 3 1/16, underlying stage IV CKD -US-renal- medical renal disease - Nephrology input appreciated, creatinine 4.49 today, may need hemodialysis if worsening -for venous mapping -Transient hyperkalemia Rx with Kayexalate per Nephro-Follow rpt K  COPD:  - Does not seem to have acute exacerbation- wheezing was most likely fluid  DM-II- Hemoglobin A1c: 9.4  Insulin-dependent, uncontrolled - continue Levemir 60 units, meal coverage to 10 units 3 times a day, sliding scale insulin  -sugars 180-200'as   Hx of Breast cancer:  -s/p of L mastectomy, chemotherapy and radiation therapy.oncologist -Last seen was 2 months ago, was told that her breast cancer was stable. -Follow-up with her oncologist in [place].  Hypertension: Improving today -Continue Coreg 25 bid,  -nitrates imdur 120 daily - hydralazine 100 q 8  GERD: -Continue PPI   OSA? -de-sat 84% off oxygen when asleepd -been told she needs Bi-pap but has never used -[place]   Left Hip pain has history of osteoarthritis : Feeling better today - hip x ray does not show fracture - CT of the left hip showed moderate osteoarthritis with very small joint  effusion, no acute or subacute abnormality - Place on pain control, increase Neurontin, added oxycodone    Super morbid Obesity Body mass index is 55.63 kg/(m^2). -encourage weight loss, diet control  Code Status: full code  Family Communication: Discussed in detail with the patient, all imaging results, lab results explained to the patient     Disposition Plan: Remain in stepdown unit   Time Spent in minutes   25 minutes  Procedures  2d echo Left hip xray Renal US CT of the left hip Cardiac CATH  Consults   Cardiology  Nephrology   DVT Prophylaxis  heparin drip   Medications  Scheduled Meds: . antiseptic oral rinse  7 mL Mouth Rinse BID  . aspirin EC  81 mg Oral Daily  . atorvastatin  40 mg Oral q1800  . carvedilol  25 mg Oral BID WC  . darbepoetin (ARANESP) injection - NON-DIALYSIS  60 mcg Subcutaneous Q Wed-1800  . docusate sodium  100 mg Oral BID  . furosemide  80 mg Intravenous Q12H  . gabapentin  300 mg Oral BID  . heparin  5,000 Units Subcutaneous 3 times per day  . hydrALAZINE  100 mg Oral 3 times per day  . insulin aspart  0-15 Units Subcutaneous TID WC  . insulin aspart  0-5 Units Subcutaneous QHS  . insulin aspart  10 Units Subcutaneous TID WC  . insulin detemir  60 Units Subcutaneous Daily  . isosorbide mononitrate  120 mg Oral Daily  . lidocaine  1 patch Transdermal Q24H  . multivitamin with minerals  1 tablet Oral Daily  . pantoprazole  40 mg Oral Daily  . sodium chloride  3 mL Intravenous Q12H  . sodium chloride  3 mL Intravenous Q12H  . sodium chloride  3 mL Intravenous Q12H  . sodium polystyrene  45 g Oral Once   Continuous Infusions:   PRN Meds:.sodium chloride, sodium chloride, sodium chloride, acetaminophen, albuterol, capsaicin, guaiFENesin-codeine, ipratropium-albuterol, ondansetron (ZOFRAN) IV, oxyCODONE, polyethylene glycol, sodium chloride, sodium chloride, sodium chloride   Antibiotics   Anti-infectives    None        Subjective:   Fair Asleep when i walk in No n/v/cp tol diet High function at home taking car eof sister c CVA and husband who is in NH  Objective:   Blood pressure 138/45, pulse 82, temperature 98.1 F (36.7 C), temperature source Oral, resp. rate 19, height 5\' 5"  (1.651 m), weight 151.637 kg (334 lb 4.8 oz), SpO2 99 %.  Wt Readings from Last  3 Encounters:  10/20/14 151.637 kg (334 lb 4.8 oz)     Intake/Output Summary (Last 24 hours) at 10/20/14 0827 Last data filed at 10/20/14 0600  Gross per 24 hour  Intake    737 ml  Output    775 ml  Net    -38 ml    Exam  General: Alert and oriented x 3, NAD. Thick neck  HEENT:  PERRLA, EOMI,  Neck: Supple,    CVS: S1 S2clear, no mrg  Respiratory: Clear to auscultation bilaterally  Abdomen:  obese, soft, NT, ND, NBS  Ext: no cyanosis clubbing, 3+ edema b/l LE   Neuro: grossly wnl  Data Review   Micro Results Recent Results (from the past 240 hour(s))  MRSA PCR Screening     Status: None   Collection Time: 10/12/14  6:42 AM  Result Value Ref Range Status   MRSA by PCR NEGATIVE NEGATIVE Final    Comment:  The GeneXpert MRSA Assay (FDA approved for NASAL specimens only), is one component of a comprehensive MRSA colonization surveillance program. It is not intended to diagnose MRSA infection nor to guide or monitor treatment for MRSA infections.     Radiology Reports US Renal  10/12/2014   CLINICAL DATA:  Acute renal insufficiency  EXAM: RENAL / URINARY TRACT ULTRASOUND COMPLETE  COMPARISON:  None.  FINDINGS: Right Kidney:  Length: 11.7 cm.  Mildly increased echogenicity.  No hydronephrosis.  Left Kidney:  Length: 12.1 cm.  Mildly increased echogenicity.  No hydronephrosis.  Bladder:  Appears normal for degree of bladder distention.  IMPRESSION: Negative for hydronephrosis. There is mildly increased renal parenchymal echogenicity consistent with medical renal disease.   Electronically Signed   By: Andreas Newport M.D.   On: 10/12/2014 06:29   Ct Hip Left Wo Contrast  10/16/2014   CLINICAL DATA:  Patient presented 10/12/2014 with shortness of breath and syncope and was found to have a non ST elevation MI. Prior history of left breast cancer post mastectomy. Acute superimposed upon chronic left hip pain. No known injuries.  EXAM: CT OF THE LEFT HIP WITHOUT  CONTRAST  TECHNIQUE: Multidetector CT imaging of the left hip was performed according to the standard protocol. Multiplanar CT image reconstructions were also generated.  COMPARISON:  Left hip x-rays 10/13/2014.  FINDINGS: No evidence of acute, subacute or healed fractures. Severe narrowing of the joint space posteriorly with associated hypertrophic spurring involving the posterior column of the acetabulum. Moderate joint space narrowing elsewhere. Very small joint effusion. No evidence of osseous metastatic disease involving the visualized bones comprising the left hip.  IMPRESSION: 1. No acute or subacute osseous abnormality. 2. Moderate osteoarthritis. 3. Very small joint effusion. 4. No evidence of osseous metastatic disease.   Electronically Signed   By: Evangeline Dakin M.D.   On: 10/16/2014 14:15   Dg Chest Port 1 View  10/15/2014   CLINICAL DATA:  CHF  EXAM: PORTABLE CHEST - 1 VIEW  COMPARISON:  10/12/2014  FINDINGS: Cardiomegaly with mild perihilar edema, improved. Left lung base is obscured, likely a combination of atelectasis and small pleural effusion.  IMPRESSION: Cardiomegaly with mild perihilar edema, improved.  Suspected small left pleural effusion.   Electronically Signed   By: Julian Hy M.D.   On: 10/15/2014 08:53   Dg Chest Portable 1 View  10/12/2014   CLINICAL DATA:  Respiratory distress  EXAM: PORTABLE CHEST - 1 VIEW  COMPARISON:  None.  FINDINGS: Central and basilar airspace opacities are present bilaterally. There is cardiomegaly. There probably is a small left pleural effusion.  IMPRESSION: Extensive airspace opacities and central and basilar distribution. This may represent alveolar edema associated with congestive heart failure. Probable left effusion. Cannot exclude infectious infiltrates.   Electronically Signed   By: Andreas Newport M.D.   On: 10/12/2014 01:51   Dg Hip Unilat With Pelvis 2-3 Views Left  10/13/2014   CLINICAL DATA:  Left hip pain.  EXAM: LEFT HIP (WITH  PELVIS) 2-3 VIEWS  COMPARISON:  None.  FINDINGS: The left hip is located. No acute bone or soft tissue abnormality is present. Degenerative changes are noted in the SI joints bilaterally and lower lumbar spine.  IMPRESSION: 1. Normal radiographic appearance of the left hip. 2. Degenerative changes within the lower lumbar spine and SI joints.   Electronically Signed   By: San Morelle M.D.   On: 10/13/2014 09:48    CBC  Recent Labs Lab 10/16/14 0225 10/17/14  0250 10/18/14 0236 10/19/14 0700 10/20/14 0545  WBC 9.7 9.5 10.7* 10.5 9.1  HGB 9.5* 9.2* 8.4* 8.5* 7.8*  HCT 30.7* 29.7* 27.2* 26.6* 25.6*  PLT 208 222 204 204 199  MCV 84.3 82.3 82.4 83.1 84.2  MCH 26.1 25.5* 25.5* 26.6 25.7*  MCHC 30.9 31.0 30.9 32.0 30.5  RDW 14.1 13.9 13.9 14.3 14.7    Chemistries   Recent Labs Lab 10/16/14 0845 10/17/14 0250 10/18/14 0236 10/19/14 0700 10/20/14 0545  NA 135 137 133* 134* 134*  K 4.6 4.7 4.8 5.1 5.9*  CL 97* 96* 94* 97* 98*  CO2 30 31 27 26 27   GLUCOSE 141* 232* 154* 184* 202*  BUN 73* 72* 80* 90* 92*  CREATININE 3.98* 3.99* 4.39* 4.49* 4.79*  CALCIUM 6.9* 7.2* 6.9* 6.8* 7.0*   ------------------------------------------------------------------------------------------------------------------ estimated creatinine clearance is 16.1 mL/min (by C-G formula based on Cr of 4.79). ------------------------------------------------------------------------------------------------------------------ No results for input(s): HGBA1C in the last 72 hours. ------------------------------------------------------------------------------------------------------------------ No results for input(s): CHOL, HDL, LDLCALC, TRIG, CHOLHDL, LDLDIRECT in the last 72 hours. ------------------------------------------------------------------------------------------------------------------ No results for input(s): TSH, T4TOTAL, T3FREE, THYROIDAB in the last 72 hours.  Invalid input(s):  FREET3 ------------------------------------------------------------------------------------------------------------------  Recent Labs  10/19/14 1520  FERRITIN 296  TIBC 221*  IRON 60    Coagulation profile  Recent Labs Lab 10/15/14 0324  INR 1.09    No results for input(s): DDIMER in the last 72 hours.  Cardiac Enzymes No results for input(s): CKMB, TROPONINI, MYOGLOBIN in the last 168 hours.  Invalid input(s): CK ------------------------------------------------------------------------------------------------------------------ Invalid input(s): [place]  10/18/14 2046 10/19/14 0810 10/19/14 1215 10/19/14 1707 10/19/14 2139 10/20/14 0753  GLUCAP 209* 181* 176* 218* 180* 180Nita Sells M.D. [facility] 10/20/2014, 8:27 AM  Pager: [phone]   Between 7am to 7pm - call Pager - [phone]  After 7pm go to [email or web address] - password [email or web address]  Call night coverage person covering after 7pm

## 2014-10-20 NOTE — Progress Notes (Signed)
Right  Upper Extremity Vein Map    Cephalic  Segment Diameter Depth Comment  1. Axilla 4.33mm    2. Mid upper arm 4.16mm  Branch  3. Above AC 4.73mm    4. In [facility]   Unable to visualize due to IV location  5. Below AC 3.68mm    6. Mid forearm 2.98mm    7. Wrist   Not visualized.                  Basilic Unable to visualize.  Left Upper Extremity Vein Map    Cephalic  Segment Diameter Depth Comment  1. Axilla 3.61mm 24.24mm   2. Mid upper arm 4.30mm 15.38mm   3. Above AC 3.54mm 10.67mm   4. In [facility] 5.56mm 7.73mm   5. Below AC 2.42mm 13.85mm   6. Mid forearm   Not visualized  7. Wrist   Not visualized                  Basilic Unable to visualize.  10/20/2014 6:17 PM Maudry Mayhew, RVT, RDCS, RDMS

## 2014-10-20 NOTE — Progress Notes (Signed)
Pt back to room. Francis Gaines Damier Disano RN.

## 2014-10-20 NOTE — Progress Notes (Signed)
OT Cancellation Note  Patient Details Name: Jordan [name] MRN: [id number] DOB: Jun 08, 1940   Cancelled Treatment:    Reason Eval/Treat Not Completed: Other (comment) Pt with PT and then moving to [place]. Will work with pt later in day or next day Betsy Pries 10/20/2014, 11:34 AM

## 2014-10-20 NOTE — Progress Notes (Signed)
Physical Therapy Treatment Patient Details Name: Jordan [name] MRN: [id number] DOB: [birth year] Today's Date: 10/20/2014    History of Present Illness 74 y.o. female with PMH of hypertension, diabetes mellitus, GERD, breast cancer, COPD, arthritis, chronic kidney disease (unknown stage), chronic leg edema, who presents with shortness of breath and syncope. Pt visiting from [place] for 3 weeks where she plans to return to care for sister s/p CVA and her spouse is in a SNF in [place].     PT Comments    Pt admitted with above diagnosis. Pt currently with functional limitations due to the deficits listed below (see PT Problem List). Pt ambulated a short distance due to not feeling well today.   Pt will benefit from skilled PT to increase their independence and safety with mobility to allow discharge to the venue listed below.    Follow Up Recommendations  Home health PT;Supervision/Assistance - 24 hour     Equipment Recommendations  None recommended by PT    Recommendations for Other Services       Precautions / Restrictions Precautions Precautions: Fall Precaution Comments: watch sats Restrictions Weight Bearing Restrictions: No    Mobility  Bed Mobility               General bed mobility comments: in chair on arrival  Transfers Overall transfer level: Needs assistance Equipment used: Rolling walker (2 wheeled) Transfers: Sit to/from Stand Sit to Stand: +2 physical assistance;Mod assist         General transfer comment: increased assist needed from low surface of chair.  Asked pt to sit down because it appeared she couldn't get her balance with pt leaning on RW handrests and then pt stated "No I can do it."  Pt then walked a few feet to chair leaning on RW with poor safety.  Son in room and states that is how pt walked at home.    Ambulation/Gait Ambulation/Gait assistance: Min assist;Mod assist;+2 physical assistance Ambulation Distance (Feet): 10 Feet Assistive  device: Rolling walker (2 wheeled) Gait Pattern/deviations: Step-through pattern;Decreased stride length;Trunk flexed   Gait velocity interpretation: Below normal speed for age/gender General Gait Details: Pt leaning on RW with arms on handrests and would not stand upright.  Pt started walking and would not follow commands for safety.  Pt was able to walk 10 feet to wheelchair with RW with poor safety.     Stairs            Wheelchair Mobility    Modified Rankin (Stroke Patients Only)       Balance Overall balance assessment: Needs assistance;History of Falls         Standing balance support: Bilateral upper extremity supported;During functional activity Standing balance-Leahy Scale: Poor Standing balance comment: heavy reliance on UE support due to flexed posture leaning on hand grippers.                    Cognition Arousal/Alertness: Lethargic Behavior During Therapy: Flat affect Overall Cognitive Status: Impaired/Different from baseline Area of Impairment: Safety/judgement;Problem solving;Following commands       Following Commands: Follows one step commands with increased time Safety/Judgement: Decreased awareness of safety;Decreased awareness of deficits   Problem Solving: Difficulty sequencing;Requires verbal cues;Requires tactile cues General Comments: Pt with poor safety leaning on RW and not following directional cues.      Exercises      General Comments        Pertinent Vitals/Pain Pain Assessment: Faces Faces Pain Scale: Hurts even  more Pain Location: LEs left hip Pain Descriptors / Indicators: Aching Pain Intervention(s): Limited activity within patient's tolerance;Monitored during session;Repositioned;Premedicated before session  VSS    Home Living                      Prior Function            PT Goals (current goals can now be found in the care plan section) Progress towards PT goals: Not progressing toward goals -  comment (pt lethargic today)    Frequency  Min 3X/week    PT Plan Current plan remains appropriate    Co-evaluation             End of Session Equipment Utilized During Treatment: Gait belt;Oxygen Activity Tolerance: Patient limited by fatigue Patient left: in chair;with call bell/phone within reach     Time: 1127-1142 PT Time Calculation (min) (ACUTE ONLY): 15 min  Charges:  $Gait Training: 8-22 mins                    G Codes:      WhiteGodfrey Pick 2014-11-04, 1:57 PM [facility] [phone] (pager)

## 2014-10-20 NOTE — Progress Notes (Signed)
Spoke with pt and her family about options for wearing CPAP tonight; pt stated that she didn't wear one and did not want one at this point in time. Explained to her to inform her RN or me if she changes her mind.

## 2014-10-20 NOTE — Progress Notes (Signed)
Transfer report received from [place] at 1108 and pt arrived to the unit via wheelchair with family at side at 35. Pt A&O x4; foley intact and unclamped; telemetry applied and verified; VSS; pt oxygen titrated to 2L and pt continue to sat at 100%; pt oriented to the unit and room; MD ordered Bipap at [place] so 2H RN Sherron Flemings was called for clarification as RN was not formed during report, Nicki stated "No she has not been using that", 2H RN was informed Bipap is not done on the floor. RN was assured pt only uses oxygen at night and has not use the Bipap. Pt sitting up in the recliner watching TV with family at bedside. Call light within reach; will closely monitor. Francis Gaines Nateisha Moyd RN.

## 2014-10-21 ENCOUNTER — Inpatient Hospital Stay (HOSPITAL_COMMUNITY): Payer: Medicare (Managed Care)

## 2014-10-21 DIAGNOSIS — N185 Chronic kidney disease, stage 5: Secondary | ICD-10-CM

## 2014-10-21 LAB — RENAL FUNCTION PANEL
ALBUMIN: 2.7 g/dL — AB (ref 3.5–5.0)
Anion gap: 11 (ref 5–15)
BUN: 96 mg/dL — AB (ref 6–20)
CHLORIDE: 97 mmol/L — AB (ref 101–111)
CO2: 28 mmol/L (ref 22–32)
Calcium: 7.1 mg/dL — ABNORMAL LOW (ref 8.9–10.3)
Creatinine, Ser: 5.27 mg/dL — ABNORMAL HIGH (ref 0.44–1.00)
GFR calc Af Amer: 9 mL/min — ABNORMAL LOW (ref >60)
GFR calc non Af Amer: 7 mL/min — ABNORMAL LOW (ref >60)
GLUCOSE: 143 mg/dL — AB (ref 65–99)
Phosphorus: 6.5 mg/dL — ABNORMAL HIGH (ref 2.5–4.6)
Potassium: 4.9 mmol/L (ref 3.5–5.1)
Sodium: 136 mmol/L (ref 135–145)

## 2014-10-21 LAB — GLUCOSE, CAPILLARY
GLUCOSE-CAPILLARY: 107 mg/dL — AB (ref 65–99)
GLUCOSE-CAPILLARY: 159 mg/dL — AB (ref 65–99)
Glucose-Capillary: 134 mg/dL — ABNORMAL HIGH (ref 65–99)
Glucose-Capillary: 210 mg/dL — ABNORMAL HIGH (ref 65–99)

## 2014-10-21 LAB — CBC
HEMATOCRIT: 25.1 % — AB (ref 36.0–46.0)
Hemoglobin: 7.5 g/dL — ABNORMAL LOW (ref 12.0–15.0)
MCH: 25.3 pg — AB (ref 26.0–34.0)
MCHC: 29.9 g/dL — ABNORMAL LOW (ref 30.0–36.0)
MCV: 84.5 fL (ref 78.0–100.0)
Platelets: 195 10*3/uL (ref 150–400)
RBC: 2.97 MIL/uL — ABNORMAL LOW (ref 3.87–5.11)
RDW: 14.8 % (ref 11.5–15.5)
WBC: 8.6 10*3/uL (ref 4.0–10.5)

## 2014-10-21 LAB — PROTIME-INR
INR: 1.05 (ref 0.00–1.49)
Prothrombin Time: 13.9 seconds (ref 11.6–15.2)

## 2014-10-21 MED ORDER — PENTAFLUOROPROP-TETRAFLUOROETH EX AERO: 1.0000 "application " | INHALATION_SPRAY | CUTANEOUS | Status: DC | PRN

## 2014-10-21 MED ORDER — LIDOCAINE-PRILOCAINE 2.5-2.5 % EX CREA: 1.0000 "application " | TOPICAL_CREAM | CUTANEOUS | Status: DC | PRN

## 2014-10-21 MED ORDER — FENTANYL CITRATE (PF) 100 MCG/2ML IJ SOLN
INTRAMUSCULAR | Status: AC
  Filled 2014-10-21: qty 2

## 2014-10-21 MED ORDER — LIDOCAINE HCL (PF) 1 % IJ SOLN: 5.0000 mL | INTRAMUSCULAR | Status: DC | PRN

## 2014-10-21 MED ORDER — CEFAZOLIN SODIUM-DEXTROSE 2-3 GM-% IV SOLR
INTRAVENOUS | Status: AC
  Filled 2014-10-21: qty 50

## 2014-10-21 MED ORDER — NEPRO/CARBSTEADY PO LIQD
237.0000 mL | ORAL | Status: DC | PRN
  Filled 2014-10-21: qty 237

## 2014-10-21 MED ORDER — HEPARIN SODIUM (PORCINE) 1000 UNIT/ML DIALYSIS: 1000.0000 [IU] | INTRAMUSCULAR | Status: DC | PRN

## 2014-10-21 MED ORDER — MIDAZOLAM HCL 2 MG/2ML IJ SOLN
INTRAMUSCULAR | Status: AC
  Filled 2014-10-21: qty 2

## 2014-10-21 MED ORDER — HEPARIN SODIUM (PORCINE) 1000 UNIT/ML DIALYSIS: 5000.0000 [IU] | INTRAMUSCULAR | Status: DC | PRN

## 2014-10-21 MED ORDER — ALTEPLASE 2 MG IJ SOLR: 2.0000 mg | Freq: Once | INTRAMUSCULAR | Status: AC | PRN

## 2014-10-21 MED ORDER — SODIUM CHLORIDE 0.9 % IV SOLN: 100.0000 mL | INTRAVENOUS | Status: DC | PRN

## 2014-10-21 MED ORDER — CEFAZOLIN (ANCEF) 1 G IV SOLR
2.0000 g | INTRAVENOUS | Status: AC
  Administered 2014-10-21: 2 g
  Filled 2014-10-21: qty 2

## 2014-10-21 MED ORDER — MIDAZOLAM HCL 2 MG/2ML IJ SOLN
INTRAMUSCULAR | Status: AC | PRN
  Administered 2014-10-21: 1 mg via INTRAVENOUS

## 2014-10-21 MED ORDER — LIDOCAINE HCL 1 % IJ SOLN
INTRAMUSCULAR | Status: AC
  Filled 2014-10-21: qty 20

## 2014-10-21 MED ORDER — FENTANYL CITRATE (PF) 100 MCG/2ML IJ SOLN
INTRAMUSCULAR | Status: AC | PRN
  Administered 2014-10-21: 50 ug via INTRAVENOUS

## 2014-10-21 MED ORDER — HEPARIN SODIUM (PORCINE) 1000 UNIT/ML IJ SOLN
INTRAMUSCULAR | Status: AC
  Filled 2014-10-21: qty 1

## 2014-10-21 NOTE — Procedures (Signed)
R IJ HD catheter placed to SVC/RA jct No complication No blood loss. See complete dictation in [facility].

## 2014-10-21 NOTE — Progress Notes (Signed)
Patient at radiology for tunneled cath placement.  Will be planning on starting dialysis per nephrology. Continue current cardiac medications. Will see her again tomorrow.  Pixie Casino, MD, [facility] Attending Cardiologist [place]

## 2014-10-21 NOTE — Sedation Documentation (Signed)
Patient is resting comfortably. 

## 2014-10-21 NOTE — Progress Notes (Signed)
Brief HPI    74 y.o.? from [place] visiting family with PMH of hypertension, diabetes mellitus ty II, GERD, breast cancer s/p CHemo-XRT, COPD, arthritis, chronic kidney disease (unknown stage), chronic leg edema--she states this had been going on for 4-5 yrs OPTA [not worsened by chemo-XRT]  Admitted 10/12/14 c  shortness of breath and syncope. She had been doing fine until 11:30 on night of admission when she started having SOB. -cough and - chest pain.   Per her daughter, patient did some exertion, then she passed out for few seconds-LOC witnessed by daughter O2 sat 60% on arrival She did not have seizure. No head injury.   On Bumex for chronic leg edema, but denies congestive heart failure.  She states that her leg swelling has been worsening recently.   BNP of 143, initial troponin was negative, afebrile, slight bradycardia, elevated creatinine at 3.60 (baseline unknown) and BUN at 42. ABG showed pH 7.206, PCO2 64.1, PO2 252. Chest x-ray showed alvola edema. U/A negative. Renal ultrasound negative for hydronephrosis. . 2D echo doneand her 2nd and 3rd troponins both returned elevated at 0.51 and 0.58. EKG shows SR with LBBB (no prior EKGs to compare to).  Patient was found to have NSTEMI and had Cardiac cath 10/19/14 Venous mapping for perm access doen and had Temp Dialysis cath placed in RU chest     Assessment & Plan    Principal Problem: Acute on chronic hypoxic respiratory failure likely due to NSTEMI and acute on chronic systolic CHF - Progressively improving  Acute on chronic systolic CHF exacerbation:  Patient has left bundle branch block of unknown duration, troponin elevation -not candidate for PPM -Echo from 5/16 in [place] shows 55-60%- no wall abnormalities, 2-D echo 6/28 showed EF of 30% with akinesis of inferior septum, akinesis of inferior wall, hypokinesis of anterior septum and akinesis of anterior wall -Not a candidate for ACEI/ARB due to chronic kidney  disease -Continue Coreg, nitrates, hydralazine -i/o = -5.7 so far  Anemia-normocytic -unclear etiology -Hemoccult -If below 7 would transfuse -Get Tsat on next draw as may require also IV iron  NSTEMI - 2-D echo 6/28 showed EF of 30% with akinesis of inferior septum, akinesis of inferior wall, hypokinesis of anterior septum and akinesis of anterior wall -Cardiac cath 10/19/14  Mild nonobstructive CAD  Severe pulmonary HTN  Elevated LV filling presss.  Normal cardiac output  AKI on CKD (chronic kidney disease):  -baseline appears to be: 3 1/16, underlying stage IV CKD -US-renal- medical renal disease - Nephrology input appreciated, creatinine continues to ? and GFR ? - will need hemodialysis---Access has been placed R upper Chest 10/21/14 and patient -for venous mapping -Transient hyperkalemia Rx with Kayexalate per Nephro -Continue Lasix??  COPD:  - Does not seem to have acute exacerbation- wheezing was most likely fluid  DM-II- Hemoglobin A1c: 9.4  Insulin-dependent, uncontrolled - continue Levemir 60 units, meal coverage to 10 units 3 times a day, sliding scale insulin  -sugars 180-200'as  Hx of Breast cancer:  -s/p of L mastectomy, chemotherapy and radiation therapy.oncologist -Last seen was 2 months ago, was told that her breast cancer was stable. -Has restriction to LUE 2/2 [place] -Follow-up with her oncologist in [place].  Hypertension: Improving today -Continue Coreg 25 bid,  -nitrates imdur 120 daily - hydralazine 100 q 8  GERD: -Continue PPI  -some implication on varied studies of association wit PNA/CKD  OSA? -de-sat 84% off oxygen when asleepd -been  told she needs Bi-pap but has never used -[place]   Left Hip pain has history of osteoarthritis : Feeling better today - hip x ray does not show fracture - CT of the left hip showed moderate osteoarthritis with very small joint effusion, no acute or subacute abnormality - Place on pain control,  increase Neurontin, added oxycodone    Super morbid Obesity Body mass index is 54.32 kg/(m^2). -encourage weight loss, diet control  Code Status: full code  Family Communication: Discussed in detail with the patient, all imaging results, lab results explained to the patient    Disposition Plan: tx to tele on 10/21/14  Time Spent in minutes   25 minutes  Procedures  2d echo Left hip xray Renal US CT of the left hip Cardiac CATH  Consults   Cardiology  Nephrology   DVT Prophylaxis  heparin drip   Medications  Scheduled Meds: . antiseptic oral rinse  7 mL Mouth Rinse BID  . aspirin EC  81 mg Oral Daily  . atorvastatin  40 mg Oral q1800  . carvedilol  25 mg Oral BID WC  . ceFAZolin  2 g Other To OR  . darbepoetin (ARANESP) injection - NON-DIALYSIS  60 mcg Subcutaneous Q Wed-1800  . docusate sodium  100 mg Oral BID  . furosemide  80 mg Intravenous Q12H  . gabapentin  300 mg Oral BID  . heparin  5,000 Units Subcutaneous 3 times per day  . hydrALAZINE  100 mg Oral 3 times per day  . insulin aspart  0-15 Units Subcutaneous TID WC  . insulin aspart  0-5 Units Subcutaneous QHS  . insulin aspart  10 Units Subcutaneous TID WC  . insulin detemir  60 Units Subcutaneous Daily  . isosorbide mononitrate  120 mg Oral Daily  . lidocaine  1 patch Transdermal Q24H  . multivitamin with minerals  1 tablet Oral Daily  . pantoprazole  40 mg Oral Daily  . sodium chloride  3 mL Intravenous Q12H  . sodium chloride  3 mL Intravenous Q12H  . sodium chloride  3 mL Intravenous Q12H   Continuous Infusions:   PRN Meds:.sodium chloride, sodium chloride, sodium chloride, acetaminophen, albuterol, capsaicin, guaiFENesin-codeine, ipratropium-albuterol, ondansetron (ZOFRAN) IV, oxyCODONE, polyethylene glycol, sodium chloride, sodium chloride, sodium chloride   Antibiotics   Anti-infectives    Start     Dose/Rate Route Frequency Ordered Stop   10/21/14 0830  ceFAZolin (ANCEF) powder 2 g      Comments:  Give in  IR   2 g Other To Surgery 10/21/14 0829 10/22/14 0830        Subjective:   Passing stool No other c/o Seems ready to do dialysis   Objective:   Blood pressure 124/53, pulse 98, temperature 98.3 F (36.8 C), temperature source Oral, resp. rate 22, height 5\' 5"  (1.651 m), weight 148.054 kg (326 lb 6.4 oz), SpO2 94 %.  Wt Readings from Last 3 Encounters:  10/21/14 148.054 kg (326 lb 6.4 oz)     Intake/Output Summary (Last 24 hours) at 10/21/14 0959 Last data filed at 10/21/14 0606  Gross per 24 hour  Intake    480 ml  Output    975 ml  Net   -495 ml    Exam  General: Alert and oriented x 3, NAD. Thick neck  HEENT:  PERRLA, EOMI,  Neck: Supple,    CVS: S1 S2clear, no mrg  Respiratory: Clear to auscultation bilaterally  Abdomen:  obese, soft, NT, ND, NBS  Ext: no cyanosis clubbing, 3+ edema b/l LE   Neuro: grossly wnl  Data Review   Micro Results Recent Results (from the past 240 hour(s))  MRSA PCR Screening     Status: None   Collection Time: 10/12/14  6:42 AM  Result Value Ref Range Status   MRSA by PCR NEGATIVE NEGATIVE Final    Comment:        The GeneXpert MRSA Assay (FDA approved for NASAL specimens only), is one component of a comprehensive MRSA colonization surveillance program. It is not intended to diagnose MRSA infection nor to guide or monitor treatment for MRSA infections.     Radiology Reports US Renal  10/12/2014   CLINICAL DATA:  Acute renal insufficiency  EXAM: RENAL / URINARY TRACT ULTRASOUND COMPLETE  COMPARISON:  None.  FINDINGS: Right Kidney:  Length: 11.7 cm.  Mildly increased echogenicity.  No hydronephrosis.  Left Kidney:  Length: 12.1 cm.  Mildly increased echogenicity.  No hydronephrosis.  Bladder:  Appears normal for degree of bladder distention.  IMPRESSION: Negative for hydronephrosis. There is mildly increased renal parenchymal echogenicity consistent with medical renal disease.   Electronically  Signed   By: Andreas Newport M.D.   On: 10/12/2014 06:29   Ct Hip Left Wo Contrast  10/16/2014   CLINICAL DATA:  Patient presented 10/12/2014 with shortness of breath and syncope and was found to have a non ST elevation MI. Prior history of left breast cancer post mastectomy. Acute superimposed upon chronic left hip pain. No known injuries.  EXAM: CT OF THE LEFT HIP WITHOUT CONTRAST  TECHNIQUE: Multidetector CT imaging of the left hip was performed according to the standard protocol. Multiplanar CT image reconstructions were also generated.  COMPARISON:  Left hip x-rays 10/13/2014.  FINDINGS: No evidence of acute, subacute or healed fractures. Severe narrowing of the joint space posteriorly with associated hypertrophic spurring involving the posterior column of the acetabulum. Moderate joint space narrowing elsewhere. Very small joint effusion. No evidence of osseous metastatic disease involving the visualized bones comprising the left hip.  IMPRESSION: 1. No acute or subacute osseous abnormality. 2. Moderate osteoarthritis. 3. Very small joint effusion. 4. No evidence of osseous metastatic disease.   Electronically Signed   By: Evangeline Dakin M.D.   On: 10/16/2014 14:15   Dg Chest Port 1 View  10/15/2014   CLINICAL DATA:  CHF  EXAM: PORTABLE CHEST - 1 VIEW  COMPARISON:  10/12/2014  FINDINGS: Cardiomegaly with mild perihilar edema, improved. Left lung base is obscured, likely a combination of atelectasis and small pleural effusion.  IMPRESSION: Cardiomegaly with mild perihilar edema, improved.  Suspected small left pleural effusion.   Electronically Signed   By: Julian Hy M.D.   On: 10/15/2014 08:53   Dg Chest Portable 1 View  10/12/2014   CLINICAL DATA:  Respiratory distress  EXAM: PORTABLE CHEST - 1 VIEW  COMPARISON:  None.  FINDINGS: Central and basilar airspace opacities are present bilaterally. There is cardiomegaly. There probably is a small left pleural effusion.  IMPRESSION: Extensive  airspace opacities and central and basilar distribution. This may represent alveolar edema associated with congestive heart failure. Probable left effusion. Cannot exclude infectious infiltrates.   Electronically Signed   By: Andreas Newport M.D.   On: 10/12/2014 01:51   Dg Hip Unilat With Pelvis 2-3 Views Left  10/13/2014   CLINICAL DATA:  Left hip pain.  EXAM: LEFT HIP (WITH PELVIS) 2-3 VIEWS  COMPARISON:  None.  FINDINGS: The left hip is located.  No acute bone or soft tissue abnormality is present. Degenerative changes are noted in the SI joints bilaterally and lower lumbar spine.  IMPRESSION: 1. Normal radiographic appearance of the left hip. 2. Degenerative changes within the lower lumbar spine and SI joints.   Electronically Signed   By: San Morelle M.D.   On: 10/13/2014 09:48    CBC  Recent Labs Lab 10/17/14 0250 10/18/14 0236 10/19/14 0700 10/20/14 0545 10/21/14 0340  WBC 9.5 10.7* 10.5 9.1 8.6  HGB 9.2* 8.4* 8.5* 7.8* 7.5*  HCT 29.7* 27.2* 26.6* 25.6* 25.1*  PLT 222 204 204 199 195  MCV 82.3 82.4 83.1 84.2 84.5  MCH 25.5* 25.5* 26.6 25.7* 25.3*  MCHC 31.0 30.9 32.0 30.5 29.9*  RDW 13.9 13.9 14.3 14.7 14.8    Chemistries   Recent Labs Lab 10/18/14 0236 10/19/14 0700 10/20/14 0545 10/20/14 1426 10/21/14 0500  NA 133* 134* 134* 134* 136  K 4.8 5.1 5.9* 5.1 4.9  CL 94* 97* 98* 97* 97*  CO2 27 26 27 28 28   GLUCOSE 154* 184* 202* 200* 143*  BUN 80* 90* 92* 95* 96*  CREATININE 4.39* 4.49* 4.79* 4.96* 5.27*  CALCIUM 6.9* 6.8* 7.0* 6.9* 7.1*   ------------------------------------------------------------------------------------------------------------------ estimated creatinine clearance is 14.4 mL/min (by C-G formula based on Cr of 5.27). ------------------------------------------------------------------------------------------------------------------ No results for input(s): HGBA1C in the last 72  hours. ------------------------------------------------------------------------------------------------------------------ No results for input(s): CHOL, HDL, LDLCALC, TRIG, CHOLHDL, LDLDIRECT in the last 72 hours. ------------------------------------------------------------------------------------------------------------------ No results for input(s): TSH, T4TOTAL, T3FREE, THYROIDAB in the last 72 hours.  Invalid input(s): FREET3 ------------------------------------------------------------------------------------------------------------------  Recent Labs  10/19/14 1520  FERRITIN 296  TIBC 221*  IRON 60    Coagulation profile  Recent Labs Lab 10/15/14 0324 10/21/14 0340  INR 1.09 1.05    No results for input(s): DDIMER in the last 72 hours.  Cardiac Enzymes No results for input(s): CKMB, TROPONINI, MYOGLOBIN in the last 168 hours.  Invalid input(s): CK ------------------------------------------------------------------------------------------------------------------ Invalid input(s): [place]  10/19/14 2139 10/20/14 0753 10/20/14 1132 10/20/14 1614 10/20/14 2122 10/21/14 0600  GLUCAP 180* 180* 187* 162* 99 134*    Verneita Griffes, MD [facility] [phone]

## 2014-10-21 NOTE — Progress Notes (Signed)
Patient ID: Jordan [name], female   DOB: 08/10/1940, 74 y.o.   MRN: [id number]  [facility] Progress Note    Assessment/ Plan:   1. 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 Plan in place for interventional radiology to put a tunnel dialysis catheter today to start hemodialysis. Will consult vascular surgery for permanent access planning (the patient is from [place] but plans on living here with family for at least the next 2-3 months) 2. Non-ST elevation myocardial infarction: With optimized medical management and now status post coronary angiogram 3. Hypertension: Blood pressure is acceptable on current therapy-anticipate to improve with hemodialysis 4. Volume overload/anasarca: Restarted diuretic therapy-marginal urine output overnight, plan to start hemodialysis today with ultrafiltration. 5. Anemia: Iron stores appear adequate, give aranesp  Subjective:   Reports to have had a somewhat restless night with difficulty falling asleep. Denies any chest pain but has intermittent shortness of breath.   Objective:   BP 124/53 mmHg  Pulse 98  Temp(Src) 98.3 F (36.8 C) (Oral)  Resp 22  Ht 5\' 5"  (1.651 m)  Wt 148.054 kg (326 lb 6.4 oz)  BMI 54.32 kg/m2  SpO2 94%  Intake/Output Summary (Last 24 hours) at 10/21/14 0931 Last data filed at 10/21/14 0606  Gross per 24 hour  Intake    480 ml  Output    976 ml  Net   -496 ml   Weight change: -3.583 kg (-7 lb 14.4 oz)  Physical Exam: [id number] resting in recliner  CVS: Regular rate and rhythm, S1 and S2 normal Resp: Clear to auscultation-no distinct rales or rhonchi Abd: Soft, obese, nontender Ext: 2+ lower extremity edema  Imaging: No results found.  Labs: BMET  Recent Labs Lab 10/16/14 0845 10/17/14 0250  10/18/14 0236 10/19/14 0700 10/20/14 0545 10/20/14 1426 10/21/14 0500  NA 135 137 133* 134* 134* 134* 136  K 4.6 4.7 4.8 5.1 5.9* 5.1 4.9  CL 97* 96* 94* 97* 98* 97* 97*  CO2 30 31 27 26 27 28 28   GLUCOSE 141* 232* 154* 184* 202* 200* 143*  BUN 73* 72* 80* 90* 92* 95* 96*  CREATININE 3.98* 3.99* 4.39* 4.49* 4.79* 4.96* 5.27*  CALCIUM 6.9* 7.2* 6.9* 6.8* 7.0* 6.9* 7.1*  PHOS  --   --   --   --   --   --  6.5*   CBC  Recent Labs Lab 10/18/14 0236 10/19/14 0700 10/20/14 0545 10/21/14 0340  WBC 10.7* 10.5 9.1 8.6  HGB 8.4* 8.5* 7.8* 7.5*  HCT 27.2* 26.6* 25.6* 25.1*  MCV 82.4 83.1 84.2 84.5  PLT 204 204 199 195    Medications:    . antiseptic oral rinse  7 mL Mouth Rinse BID  . aspirin EC  81 mg Oral Daily  . atorvastatin  40 mg Oral q1800  . carvedilol  25 mg Oral BID WC  . ceFAZolin  2 g Other To OR  . darbepoetin (ARANESP) injection - NON-DIALYSIS  60 mcg Subcutaneous Q Wed-1800  . docusate sodium  100 mg Oral BID  . furosemide  80 mg Intravenous Q12H  . gabapentin  300 mg Oral BID  . heparin  5,000 Units Subcutaneous 3 times per day  . hydrALAZINE  100 mg Oral 3 times per day  . insulin aspart  0-15 Units Subcutaneous TID WC  . insulin aspart  0-5  Units Subcutaneous QHS  . insulin aspart  10 Units Subcutaneous TID WC  . insulin detemir  60 Units Subcutaneous Daily  . isosorbide mononitrate  120 mg Oral Daily  . lidocaine  1 patch Transdermal Q24H  . multivitamin with minerals  1 tablet Oral Daily  . pantoprazole  40 mg Oral Daily  . sodium chloride  3 mL Intravenous Q12H  . sodium chloride  3 mL Intravenous Q12H  . sodium chloride  3 mL Intravenous Q12H   Elmarie Shiley, MD 10/21/2014, 9:31 AM

## 2014-10-21 NOTE — Sedation Documentation (Signed)
Patient is resting comfortably.  Pt denies pain.

## 2014-10-21 NOTE — Consult Note (Signed)
Referred by:  Dr. Posey Pronto (Nephrology)  Reason for referral: New access  History of Present Illness  Jordan [name] is a 74 y.o. (March 22, 1941) female who presents for evaluation for permanent access.  The patient is right hand dominant.  The patient has not had previous access procedures.  Previous central venous cannulation procedures include: RIJV TDC.  The patient has never had a PPM placed.  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  Past Medical History  Diagnosis Date  . Breast cancer   . Diabetes mellitus without complication   . Hypertension   . Arthritis   . COPD (chronic obstructive pulmonary disease)   . Renal disorder     Family reports acute renal failure  . GERD (gastroesophageal reflux disease)   . CKD (chronic kidney disease)     Past Surgical History  Procedure Laterality Date  . Breast surgery Left   . Cardiac catheterization [name] 10/19/2014    Procedure: Right/Left Heart Cath and Coronary Angiography;  Surgeon: Peter M Martinique, MD;  Location: [facility];  Service: Cardiovascular;  Laterality: [name];    History   Social History  . Marital Status: Single    Spouse Name: [name]  . Number of Children: [name]  . Years of Education: [name]   Occupational History  . Not on file.   Social History Main Topics  . Smoking status: Never Smoker   . Smokeless tobacco: Not on file  . Alcohol Use: No  . Drug Use: Not on file  . Sexual Activity: Not on file   Other Topics Concern  . Not on file   Social History Narrative    Family History  Problem Relation Age of Onset  . Diabetes Mother   . Hypertension Mother   . Stroke Sister   . Hypertension Sister     Current Facility-Administered Medications  Medication Dose Route Frequency Provider Last Rate Last Dose  . 0.9 %  sodium chloride infusion  250 mL Intravenous PRN Ivor Costa, MD      . 0.9 %  sodium chloride  infusion  250 mL Intravenous PRN Peter M Martinique, MD      . 0.9 %  sodium chloride infusion  250 mL Intravenous PRN Peter M Martinique, MD      . acetaminophen (TYLENOL) tablet 650 mg  650 mg Oral Q4H PRN Ivor Costa, MD      . albuterol (PROVENTIL) (2.5 MG/3ML) 0.083% nebulizer solution 5 mg  5 mg Nebulization Q2H PRN Ivor Costa, MD      . antiseptic oral rinse (CPC / CETYLPYRIDINIUM CHLORIDE 0.05%) solution 7 mL  7 mL Mouth Rinse BID Geradine Girt, DO   7 mL at 10/20/14 2141  . aspirin EC tablet 81 mg  81 mg Oral Daily Ivor Costa, MD   81 mg at 10/21/14 1013  . atorvastatin (LIPITOR) tablet 40 mg  40 mg Oral q1800 Peter M Martinique, MD   40 mg at 10/20/14 1721  . capsaicin (ZOSTRIX) Q000111Q % cream 1 application  1 application Topical BID PRN Ivor Costa, MD      . carvedilol (COREG) tablet 25 mg  25 mg Oral BID WC Ripudeep Krystal Eaton, MD   25 mg at 10/20/14 1721  . ceFAZolin (ANCEF) 2-3 GM-% IVPB SOLR           . Darbepoetin Alfa (ARANESP) injection 60 mcg  60 mcg Subcutaneous Q Wed-1800 Elmarie Shiley, MD   60 mcg at 10/20/14 1721  . docusate sodium (COLACE) capsule 100 mg  100 mg Oral BID Ripudeep K Rai, MD   100 mg at 10/21/14 1013  . fentaNYL (SUBLIMAZE) 100 MCG/2ML injection           . furosemide (LASIX) injection 80 mg  80 mg Intravenous Q12H Peter M Martinique, MD   80 mg at 10/20/14 2359  . gabapentin (NEURONTIN) capsule 300 mg  300 mg Oral BID Nita Sells, MD   300 mg at 10/21/14 1013  . guaiFENesin-codeine 100-10 MG/5ML solution 10 mL  10 mL Oral Q6H PRN Ivor Costa, MD   10 mL at 10/14/14 1749  . heparin 1000 UNIT/ML injection           . heparin injection 5,000 Units  5,000 Units Subcutaneous 3 times per day Monia Sabal, PA-C   Stopped at 10/21/14 0600  . hydrALAZINE (APRESOLINE) tablet 100 mg  100 mg Oral 3 times per day Ripudeep Krystal Eaton, MD   100 mg at 10/21/14 0601  . insulin aspart (novoLOG) injection 0-15 Units  0-15 Units Subcutaneous TID WC Geradine Girt, DO   2 Units at 10/21/14 0601  . insulin  aspart (novoLOG) injection 0-5 Units  0-5 Units Subcutaneous QHS Geradine Girt, DO   2 Units at 10/18/14 2154  . insulin aspart (novoLOG) injection 10 Units  10 Units Subcutaneous TID WC Ripudeep Krystal Eaton, MD   10 Units at 10/20/14 1721  . insulin detemir (LEVEMIR) injection 60 Units  60 Units Subcutaneous Daily Ripudeep Krystal Eaton, MD   60 Units at 10/21/14 1137  . ipratropium-albuterol (DUONEB) 0.5-2.5 (3) MG/3ML nebulizer solution 3 mL  3 mL Nebulization Q6H PRN John P Day, RRT      . isosorbide mononitrate (IMDUR) 24 hr tablet 120 mg  120 mg Oral Daily Mihai Croitoru, MD   120 mg at 10/20/14 1005  . lidocaine (LIDODERM) 5 % 1 patch  1 patch Transdermal Q24H Geradine Girt, DO   1 patch at 10/21/14 1137  . lidocaine (XYLOCAINE) 1 % (with pres) injection           . midazolam (VERSED) 2 MG/2ML injection           . multivitamin with minerals tablet 1 tablet  1 tablet Oral Daily Ivor Costa, MD   1 tablet at 10/21/14 1013  . ondansetron (ZOFRAN) injection 4 mg  4 mg Intravenous Q6H PRN Ivor Costa, MD   4 mg at 10/15/14 2225  . oxyCODONE (Oxy IR/ROXICODONE) immediate release tablet 5 mg  5 mg Oral Q4H PRN Ripudeep Krystal Eaton, MD   5 mg at 10/19/14 0509  . pantoprazole (PROTONIX) EC tablet 40 mg  40 mg Oral Daily Ivor Costa, MD   40 mg at 10/21/14 1013  . polyethylene glycol (MIRALAX / GLYCOLAX) packet 17 g  17 g Oral Daily PRN Ripudeep K Rai, MD      . sodium chloride 0.9 % injection 3 mL  3 mL Intravenous Q12H Ivor Costa, MD   3 mL at 10/21/14 1000  . sodium chloride 0.9 % injection 3 mL  3 mL Intravenous PRN Ivor Costa, MD   3 mL at 10/12/14 1233  . sodium chloride 0.9 % injection 3 mL  3 mL Intravenous Q12H Peter M Martinique, MD   3 mL at 10/20/14 1007  . sodium chloride 0.9 % injection 3 mL  3 mL  Intravenous PRN Peter M Martinique, MD      . sodium chloride 0.9 % injection 3 mL  3 mL Intravenous Q12H Peter M Martinique, MD   3 mL at 10/21/14 1000  . sodium chloride 0.9 % injection 3 mL  3 mL Intravenous PRN Peter M Martinique,  MD        No Known Allergies   REVIEW OF SYSTEMS:  (Positives checked otherwise negative)  CARDIOVASCULAR:   [ ]  chest pain,  [ ]  chest pressure,  [ ]  palpitations,  [x]  shortness of breath when laying flat,  [x]  shortness of breath with exertion,   [ ]  pain in feet when walking,  [ ]  pain in feet when laying flat, [ ]  history of blood clot in veins (DVT),  [ ]  history of phlebitis,  [x]  swelling in legs,  [ ]  varicose veins  PULMONARY:   [ ]  productive cough,  [ ]  asthma,  [ ]  wheezing  NEUROLOGIC:   [ ]  weakness in arms or legs,  [ ]  numbness in arms or legs,  [ ]  difficulty speaking or slurred speech,  [ ]  temporary loss of vision in one eye,  [ ]  dizziness  HEMATOLOGIC:   [ ]  bleeding problems,  [ ]  problems with blood clotting too easily  MUSCULOSKEL:   [ ]  joint pain, [ ]  joint swelling  GASTROINTEST:   [ ]  vomiting blood,  [ ]  blood in stool     GENITOURINARY:   [ ]  burning with urination,  [ ]  blood in urine  PSYCHIATRIC:   [ ]  history of major depression  INTEGUMENTARY:   [x]  rashes,  [ ]  ulcers  CONSTITUTIONAL:   [ ]  fever,  [ ]  chills   Physical Examination  Filed Vitals:   10/21/14 1044 10/21/14 1052 10/21/14 1054 10/21/14 1100  BP: 180/74 167/75 167/98 163/83  Pulse: 81 79 82 88  Temp:      TempSrc:      Resp: 20 20 23 20   Height:      Weight:      SpO2: 98% 93% 96% 93%   Body mass index is 54.32 kg/(m^2).  General: A&O x 3, WD, morbidly obese  Head: [place]/AT  Ear/Nose/Throat: Hearing grossly intact, nares w/o erythema or drainage, oropharynx w/o Erythema/Exudate, Mallampati score: 3  Eyes: PERRLA, EOMI  Neck: Supple, no nuchal rigidity, no palpable LAD  Pulmonary: Sym exp, distal BS, CTAB, no rales, rhonchi, & wheezing  Cardiac: RRR, Nl S1, S2, no Murmurs, rubs or gallops  Vascular: Vessel Right Left  Radial Palpable Palpable  Ulnar Not Palpable Not Palpable  Brachial Palpable Palpable  Carotid Palpable, without  bruit Palpable, without bruit  Aorta Not palpable due to obesity [name]  Femoral Not Palpable due to pannus Not Palpable due to pannus  Popliteal Not palpable Not palpable  PT Faintly Palpable Faintly Palpable  DP Palpable Palpable   Gastrointestinal: soft, NTND, -G/R, - HSM, - masses, - CVAT B, large pannus  Musculoskeletal: M/S 5/5 throughout , Extremities without ischemic changes , multiple IV in R forearm, Left entire arm swollen 1-2+, R>>L chronic venous stasis skin changes, BLE edema 1-2+  Neurologic: CN 2-12 intact , Pain and light touch intact in extremities . Motor exam as listed above  Psychiatric: Judgment intact, Mood & affect appropriate for pt's clinical situation  Dermatologic: See M/S exam for extremity exam, no rashes otherwise noted  Lymph : No Cervical, Axillary, or Inguinal lymphadenopathy    Non-Invasive Vascular  Imaging  Vein Mapping  (Date: 10/20/14):   R arm: acceptable vein conduits include Cephalic, basilic not visualized  L arm: acceptable vein conduits include cephalic, basilic not visualized  Radiology: Ir Fluoro Guide Cv Line Right  10/21/2014   CLINICAL DATA:  Chronic renal insufficiency, needs access for hemodialysis  EXAM: TUNNELED HEMODIALYSIS CATHETER PLACEMENT WITH ULTRASOUND AND FLUOROSCOPIC GUIDANCE  TECHNIQUE: The procedure, risks, benefits, and alternatives were explained to the patient. Questions regarding the procedure were encouraged and answered. The patient understands and consents to the procedure. As antibiotic prophylaxis, cefazolin was ordered pre-procedure and administered intravenously within one hour of incision.Patency of the right IJ vein was confirmed with ultrasound with image documentation. An appropriate skin site was determined. Region was prepped using maximum barrier technique including cap and mask, sterile gown, sterile gloves, large sterile sheet, and Chlorhexidine as cutaneous antisepsis. The region was infiltrated locally with  1% lidocaine.  Intravenous Fentanyl and Versed were administered as conscious sedation during continuous cardiorespiratory monitoring by the radiology RN, with a total moderate sedation time of 6 minutes.  Under real-time ultrasound guidance, the right IJ vein was accessed with a 21 gauge micropuncture needle; the needle tip within the vein was confirmed with ultrasound image documentation. Needle exchanged over the 018 guidewire for transitional dilator, which allowed advancement of a Benson wire into the IVC. Over this, an MPA catheter was advanced. A Hemosplit 19 hemodialysis catheter was tunneled from the right anterior chest wall approach to the right IJ dermatotomy site. The MPA catheter was exchanged over an Amplatz wire for serial vascular dilators which allow placement of a peel-away sheath, through which the catheter was advanced under intermittent fluoroscopy, positioned with its tips in the proximal and midright atrium. Spot chest radiograph confirms good catheter position. No pneumothorax. Catheter was flushed and primed per protocol. Catheter secured externally with O Prolene sutures. The right IJ dermatotomy site was closed with Dermabond.  COMPLICATIONS: COMPLICATIONS None immediate  FLUOROSCOPY TIME:  30 seconds, 22 mGy  COMPARISON:  None  IMPRESSION: 1. Technically successful placement of tunneled right IJ hemodialysis catheter with ultrasound and fluoroscopic guidance. Ready for routine use.  ACCESS: Remains approachable for percutaneous intervention as needed.   Electronically Signed   By: Lucrezia Europe M.D.   On: 10/21/2014 11:15   Ir US Guide Vasc Access Right  10/21/2014   CLINICAL DATA:  Chronic renal insufficiency, needs access for hemodialysis  EXAM: TUNNELED HEMODIALYSIS CATHETER PLACEMENT WITH ULTRASOUND AND FLUOROSCOPIC GUIDANCE  TECHNIQUE: The procedure, risks, benefits, and alternatives were explained to the patient. Questions regarding the procedure were encouraged and answered. The  patient understands and consents to the procedure. As antibiotic prophylaxis, cefazolin was ordered pre-procedure and administered intravenously within one hour of incision.Patency of the right IJ vein was confirmed with ultrasound with image documentation. An appropriate skin site was determined. Region was prepped using maximum barrier technique including cap and mask, sterile gown, sterile gloves, large sterile sheet, and Chlorhexidine as cutaneous antisepsis. The region was infiltrated locally with 1% lidocaine.  Intravenous Fentanyl and Versed were administered as conscious sedation during continuous cardiorespiratory monitoring by the radiology RN, with a total moderate sedation time of 6 minutes.  Under real-time ultrasound guidance, the right IJ vein was accessed with a 21 gauge micropuncture needle; the needle tip within the vein was confirmed with ultrasound image documentation. Needle exchanged over the 018 guidewire for transitional dilator, which allowed advancement of a Benson wire into the IVC. Over this, an MPA catheter  was advanced. A Hemosplit 19 hemodialysis catheter was tunneled from the right anterior chest wall approach to the right IJ dermatotomy site. The MPA catheter was exchanged over an Amplatz wire for serial vascular dilators which allow placement of a peel-away sheath, through which the catheter was advanced under intermittent fluoroscopy, positioned with its tips in the proximal and midright atrium. Spot chest radiograph confirms good catheter position. No pneumothorax. Catheter was flushed and primed per protocol. Catheter secured externally with O Prolene sutures. The right IJ dermatotomy site was closed with Dermabond.  COMPLICATIONS: COMPLICATIONS None immediate  FLUOROSCOPY TIME:  30 seconds, 22 mGy  COMPARISON:  None  IMPRESSION: 1. Technically successful placement of tunneled right IJ hemodialysis catheter with ultrasound and fluoroscopic guidance. Ready for routine use.   ACCESS: Remains approachable for percutaneous intervention as needed.   Electronically Signed   By: Lucrezia Europe M.D.   On: 10/21/2014 11:15   Laboratory: CBC:    Component Value Date/Time   WBC 8.6 10/21/2014 0340   RBC 2.97* 10/21/2014 0340   HGB 7.5* 10/21/2014 0340   HCT 25.1* 10/21/2014 0340   PLT 195 10/21/2014 0340   MCV 84.5 10/21/2014 0340   MCH 25.3* 10/21/2014 0340   MCHC 29.9* 10/21/2014 0340   RDW 14.8 10/21/2014 0340   LYMPHSABS 2.4 10/12/2014 0100   MONOABS 0.5 10/12/2014 0100   EOSABS 0.1 10/12/2014 0100   BASOSABS 0.0 10/12/2014 0100    BMP:    Component Value Date/Time   NA 136 10/21/2014 0500   K 4.9 10/21/2014 0500   CL 97* 10/21/2014 0500   CO2 28 10/21/2014 0500   GLUCOSE 143* 10/21/2014 0500   BUN 96* 10/21/2014 0500   CREATININE 5.27* 10/21/2014 0500   CALCIUM 7.1* 10/21/2014 0500   GFRNONAA 7* 10/21/2014 0500   GFRAA 9* 10/21/2014 0500    Coagulation: Lab Results  Component Value Date   INR 1.05 10/21/2014   INR 1.09 10/15/2014   INR 1.06 10/12/2014   No results found for: PTT  Lipids:    Component Value Date/Time   CHOL 199 10/12/2014 0745   TRIG 113 10/12/2014 0745   HDL 53 10/12/2014 0745   CHOLHDL 3.8 10/12/2014 0745   VLDL 23 10/12/2014 0745   LDLCALC 123* 10/12/2014 0745    Medical Decision Making  Lynnea Ferrier [name] is a 74 y.o. female who presents with new onset ESRD requiring hemodialysis, NSTEMI, uncontrolled DM, uncontrolled HTN, hyperlipidemia, s/p L mastectomy for breast cancer, morbid obesity   Obviously she has significant active medical problems which will complicate the timing of any permanent access placement.  Multiple consultants are already onboard to help optimize her care.  Based on vein mapping and examination, this patient's permanent access options include: R BC AVF.   Protect the R arm for access.  I would NOT place L BC AVF, as I suspect the swelling in L arm from her chronic lymphedema would  limit the ability to cannulate the fistula.  Another central venous access might need to be placed to facilitate placement of her R BC AVF.  Given the recent start of HD, I would defer permanent access placement until early next week, Tuesday at the earliest, to allow HD to help with her total body fluid overload.  I had an extensive discussion with this patient in regards to the nature of access surgery, including risk, benefits, and alternatives.    The patient is aware that the risks of access surgery include but are not  limited to: bleeding, infection, steal syndrome, nerve damage, ischemic monomelic neuropathy, failure of access to mature, and possible need for additional access procedures in the future.  The patient has agreed to proceed with the above procedure which will be scheduled Tuesday tenatively.  Adele Barthel, MD [facility] Office: [phone] Pager: [phone]  10/21/2014, 12:56 PM

## 2014-10-21 NOTE — Progress Notes (Signed)
OT Cancellation Note  Patient Details Name: Jordan [name] MRN: [id number] DOB: 04/24/1940   Cancelled Treatment:    Reason Eval/Treat Not Completed: Patient at procedure or test/ unavailable. Pt gone to radiology, will re attempt later today as time allows/as appropriate or next day  Britt Bottom 10/21/2014, 10:39 AM

## 2014-10-22 DIAGNOSIS — I10 Essential (primary) hypertension: Secondary | ICD-10-CM | POA: Insufficient documentation

## 2014-10-22 LAB — CBC
HCT: 26 % — ABNORMAL LOW (ref 36.0–46.0)
Hemoglobin: 7.9 g/dL — ABNORMAL LOW (ref 12.0–15.0)
MCH: 25.6 pg — ABNORMAL LOW (ref 26.0–34.0)
MCHC: 30.4 g/dL (ref 30.0–36.0)
MCV: 84.4 fL (ref 78.0–100.0)
Platelets: 216 10*3/uL (ref 150–400)
RBC: 3.08 MIL/uL — ABNORMAL LOW (ref 3.87–5.11)
RDW: 15.1 % (ref 11.5–15.5)
WBC: 10 10*3/uL (ref 4.0–10.5)

## 2014-10-22 LAB — RENAL FUNCTION PANEL
ANION GAP: 9 (ref 5–15)
Albumin: 2.8 g/dL — ABNORMAL LOW (ref 3.5–5.0)
Albumin: 2.8 g/dL — ABNORMAL LOW (ref 3.5–5.0)
Anion gap: 11 (ref 5–15)
BUN: 96 mg/dL — ABNORMAL HIGH (ref 6–20)
BUN: 57 mg/dL — AB (ref 6–20)
CALCIUM: 7.7 mg/dL — AB (ref 8.9–10.3)
CHLORIDE: 97 mmol/L — AB (ref 101–111)
CO2: 30 mmol/L (ref 22–32)
CO2: 30 mmol/L (ref 22–32)
CREATININE: 3.64 mg/dL — AB (ref 0.44–1.00)
Calcium: 7.1 mg/dL — ABNORMAL LOW (ref 8.9–10.3)
Chloride: 99 mmol/L — ABNORMAL LOW (ref 101–111)
Creatinine, Ser: 4.93 mg/dL — ABNORMAL HIGH (ref 0.44–1.00)
GFR calc Af Amer: 13 mL/min — ABNORMAL LOW (ref >60)
GFR calc non Af Amer: 8 mL/min — ABNORMAL LOW (ref >60)
GFR calc non Af Amer: 12 mL/min — ABNORMAL LOW (ref >60)
GFR, EST AFRICAN AMERICAN: 9 mL/min — AB (ref >60)
GLUCOSE: 141 mg/dL — AB (ref 65–99)
GLUCOSE: 114 mg/dL — AB (ref 65–99)
POTASSIUM: 4.5 mmol/L (ref 3.5–5.1)
POTASSIUM: 3.9 mmol/L (ref 3.5–5.1)
Phosphorus: 5.9 mg/dL — ABNORMAL HIGH (ref 2.5–4.6)
Phosphorus: 4.3 mg/dL (ref 2.5–4.6)
SODIUM: 138 mmol/L (ref 135–145)
Sodium: 138 mmol/L (ref 135–145)

## 2014-10-22 LAB — HEPATITIS B SURFACE ANTIGEN: Hepatitis B Surface Ag: NEGATIVE

## 2014-10-22 LAB — CBC WITH DIFFERENTIAL/PLATELET
BASOS PCT: 0 % (ref 0–1)
Basophils Absolute: 0 10*3/uL (ref 0.0–0.1)
EOS ABS: 0.1 10*3/uL (ref 0.0–0.7)
EOS PCT: 2 % (ref 0–5)
HCT: 25.2 % — ABNORMAL LOW (ref 36.0–46.0)
Hemoglobin: 7.8 g/dL — ABNORMAL LOW (ref 12.0–15.0)
LYMPHS PCT: 13 % (ref 12–46)
Lymphs Abs: 1.2 10*3/uL (ref 0.7–4.0)
MCH: 26.4 pg (ref 26.0–34.0)
MCHC: 31 g/dL (ref 30.0–36.0)
MCV: 85.1 fL (ref 78.0–100.0)
Monocytes Absolute: 1 10*3/uL (ref 0.1–1.0)
Monocytes Relative: 10 % (ref 3–12)
Neutro Abs: 6.8 10*3/uL (ref 1.7–7.7)
Neutrophils Relative %: 75 % (ref 43–77)
Platelets: 225 10*3/uL (ref 150–400)
RBC: 2.96 MIL/uL — ABNORMAL LOW (ref 3.87–5.11)
RDW: 15 % (ref 11.5–15.5)
WBC: 9.1 10*3/uL (ref 4.0–10.5)

## 2014-10-22 LAB — GLUCOSE, CAPILLARY
Glucose-Capillary: 110 mg/dL — ABNORMAL HIGH (ref 65–99)
Glucose-Capillary: 137 mg/dL — ABNORMAL HIGH (ref 65–99)
Glucose-Capillary: 149 mg/dL — ABNORMAL HIGH (ref 65–99)
Glucose-Capillary: 87 mg/dL (ref 65–99)
Glucose-Capillary: 140 mg/dL — ABNORMAL HIGH (ref 65–99)

## 2014-10-22 MED ORDER — GABAPENTIN 300 MG PO CAPS
300.0000 mg | ORAL_CAPSULE | Freq: Every day | ORAL | Status: DC
  Administered 2014-10-22 – 2014-10-26 (×5): 300 mg via ORAL
  Filled 2014-10-22 (×6): qty 1

## 2014-10-22 NOTE — Progress Notes (Signed)
Patient Name: Jordan [name] Date of Encounter: 10/22/2014  Principal Problem:   SOB (shortness of breath) Active Problems:   Breast cancer   Diabetes mellitus without complication   Hypertension   Arthritis   COPD (chronic obstructive pulmonary disease)   Syncope   CKD (chronic kidney disease)   Acute on chronic respiratory failure   Chest pain with high risk for cardiac etiology   LBBB (left bundle branch block)   Elevated troponin   Acute systolic CHF (congestive heart failure)   NSTEMI (non-ST elevated myocardial infarction)   Pulmonary HTN   AKI (acute kidney injury)   CKD (chronic kidney disease) stage 4, GFR 15-29 ml/min   Left hip pain   SUBJECTIVE  Denies chest pain or palpitation. SOB is stable. Complaining of leg pain.   CURRENT MEDS . antiseptic oral rinse  7 mL Mouth Rinse BID  . aspirin EC  81 mg Oral Daily  . atorvastatin  40 mg Oral q1800  . carvedilol  25 mg Oral BID WC  . darbepoetin (ARANESP) injection - NON-DIALYSIS  60 mcg Subcutaneous Q Wed-1800  . docusate sodium  100 mg Oral BID  . gabapentin  300 mg Oral QHS  . heparin  5,000 Units Subcutaneous 3 times per day  . hydrALAZINE  100 mg Oral 3 times per day  . insulin aspart  0-15 Units Subcutaneous TID WC  . insulin aspart  0-5 Units Subcutaneous QHS  . insulin aspart  10 Units Subcutaneous TID WC  . insulin detemir  60 Units Subcutaneous Daily  . isosorbide mononitrate  120 mg Oral Daily  . lidocaine  1 patch Transdermal Q24H  . multivitamin with minerals  1 tablet Oral Daily  . pantoprazole  40 mg Oral Daily  . sodium chloride  3 mL Intravenous Q12H  . sodium chloride  3 mL Intravenous Q12H  . sodium chloride  3 mL Intravenous Q12H    OBJECTIVE  Filed Vitals:   10/22/14 0200 10/22/14 0249 10/22/14 0557 10/22/14 0815  BP: 108/61 104/52 150/55   Pulse: 67 80 72 93  Temp: 98.4 F (36.9 C) 98.7 F (37.1 C) 98.8 F (37.1 C)   TempSrc: Oral Oral Oral   Resp: 17 18    Height:       Weight: 315 lb 14.7 oz (143.3 kg)  319 lb 10.7 oz (145 kg)   SpO2: 100% 99% 97% 93%    Intake/Output Summary (Last 24 hours) at 10/22/14 1049 Last data filed at 10/22/14 1032  Gross per 24 hour  Intake    940 ml  Output   2950 ml  Net  -2010 ml   Filed Weights   10/21/14 2252 10/22/14 0200 10/22/14 0557  Weight: 324 lb 1.2 oz (147 kg) 315 lb 14.7 oz (143.3 kg) 319 lb 10.7 oz (145 kg)    PHYSICAL EXAM  General: Pleasant, NAD. Resting in recliner.  Neuro: Alert and oriented X 3. Moves all extremities spontaneously. Psych: Normal affect. HEENT:  Normal  Neck: Supple without bruits. Difficult to asses JVD due to girth.  Lungs:  Resp regular and unlabored, CTA. Heart: RRR no s3, s4, or murmurs. Abdomen: Soft, non-tender, non-distended, BS + x 4.  Extremities: No clubbing, cyanosis. DP/PT/Radials 2+ and equal bilaterally. 2+ LE edema. Tender LE with warm to touch.   Accessory Clinical Findings  CBC  Recent Labs  10/21/14 0340 10/21/14 2315  WBC 8.6 9.1  NEUTROABS  --  6.8  HGB 7.5* 7.8*  HCT 25.1* 25.2*  MCV 84.5 85.1  PLT 195 [id number]   Basic Metabolic Panel  Recent Labs  10/21/14 0500 10/21/14 2315  NA 136 138  K 4.9 4.5  CL 97* 99*  CO2 28 30  GLUCOSE 143* 141*  BUN 96* 96*  CREATININE 5.27* 4.93*  CALCIUM 7.1* 7.1*  PHOS 6.5* 5.9*   Liver Function Tests  Recent Labs  10/21/14 0500 10/21/14 2315  ALBUMIN 2.7* 2.8*    TELE  NSR at rate of 60-80s.   Radiology/Studies   ASSESSMENT AND PLAN   1. NSTEMI. Troponin elevation with flat curve - demand type ischemia due to CHF and hypoxia. Echo demonstrates severe LV dysfunction with EF of 30% and regional wall motion abnormalities. Cath 10/19/14 showed minimal CAD. - May need repeat study. Further management per MD.  - Continue ASA, Lipitor 80mg , coreg 25mg  BID, imdur  2. Acute on chronic systolic CHF. EF 30%. I/O negative 6.5L since admission.  She is on hydralazine and nitrates. . Discontinued  lasix. Avoid ACE or ARB.   3. CKD stage 4. Progressed to ESRD. 2nd HD today, plan for 3rd tomorrow. Possible plans for right brachiocephalic fistula this coming Tuesday per vascular surgery. Nephrology following. Discontinued lasix.   4. DM poorly controlled. On insulin. Per primary team.   5. HTN- relatively stable. On Coreg, hydralazine, and nitrates for now.   6. Hyperlipidemia - continue statins.   7. LE Tenderness - Complaining bilateral LE tenderness. Skin is warm. No DVT on LE duplex 73/16. Consider repeat study. Management per MD.    7. COPD. Breathing stable  8. History of breast CA With left arm lymphedema - do not use for IV or radial access  9. Super Morbid Obesity.  10. Pulmonary HTN - suspect major component of OSA/obesity-hypoventilation, but also left heart failure  11. LBBB - CRT may be an option down the road, if she fails medical management. Obesity increases complexity of device implantation and increases risk of complications.  12. Anemia - Per perimary  Signed, Anderson Coppock PA-C Pager [phone]

## 2014-10-22 NOTE — Care Management (Signed)
Important Message  Patient Details  Name: Jordan [name] MRN: [id number] Date of Birth: August 13, 1940   Medicare Important Message Given:  Yes-third notification given    Nathen May 10/22/2014, 10:19 AM

## 2014-10-22 NOTE — Progress Notes (Signed)
Brief HPI    74 y.o.? from [place] visiting family with PMH of hypertension, diabetes mellitus ty II, GERD, breast cancer s/p CHemo-XRT, COPD, arthritis, chronic kidney disease (unknown stage), chronic leg edema--she states this had been going on for 4-5 yrs OPTA [not worsened by chemo-XRT]  Admitted 10/12/14 c  shortness of breath and syncope. She had been doing fine until 11:30 on night of admission when she started having SOB. -cough and - chest pain.   Per her daughter, patient did some exertion, then she passed out for few seconds-LOC witnessed by daughter O2 sat 60% on arrival She did not have seizure. No head injury.   On Bumex for chronic leg edema, but denies congestive heart failure.  She states that her leg swelling has been worsening recently.   BNP of 143, initial troponin was negative, afebrile, slight bradycardia, elevated creatinine at 3.60 (baseline unknown) and BUN at 42. ABG showed pH 7.206, PCO2 64.1, PO2 252. Chest x-ray showed alvola edema. U/A negative. Renal ultrasound negative for hydronephrosis. . 2D echo doneand her 2nd and 3rd troponins both returned elevated at 0.51 and 0.58. EKG shows SR with LBBB (no prior EKGs to compare to).  Patient was found to have NSTEMI and had Cardiac cath 10/19/14 Venous mapping for perm access doen and had Temp Dialysis cath placed in RU chest  Getting dilaysis now     Assessment & Plan    Principal Problem: Acute on chronic hypoxic respiratory failure likely due to NSTEMI and acute on chronic systolic CHF - Progressively improving  Acute on chronic systolic CHF exacerbation:  Patient has left bundle branch block of unknown duration, troponin elevation -not candidate for PPM -Echo from 5/16 in [place] shows 55-60%- no wall abnormalities, 2-D echo 6/28 showed EF of 30% with akinesis of inferior septum, akinesis of inferior wall, hypokinesis of anterior septum and akinesis of anterior wall -Not a candidate for ACEI/ARB  due to chronic kidney disease -Continue Coreg, nitrates, hydralazine -i/o = -7.18 so far -dialysis for volume: cardiology did d/c lasix on 7/8  Anemia-normocytic and probably of chronic disease iron/TIBC ratio above 18% -unclear etiology -Hemoccult -If below 7 would transfuse -Iron studies done 7/5 -consideration for IV Iron and Aranesp per Renal  NSTEMI - 2-D echo 6/28 showed EF of 30% with akinesis of inferior septum, akinesis of inferior wall, hypokinesis of anterior septum and akinesis of anterior wall -Cardiac cath 10/19/14  Mild nonobstructive CAD  Severe pulmonary HTN  Elevated LV filling presss.  Normal cardiac output  AKI on CKD (chronic kidney disease):  -baseline appears to be: 3 1/16, underlying stage IV CKD -US-renal- medical renal disease - Nephrology input appreciated, creatinine continues to ? and GFR ? - ist hemodialysis 10/21/14---Access has been placed R upper Chest 10/21/14 and patient -for venous mapping -Transient hyperkalemia Rx with Kayexalate per Nephro  COPD:  - Does not seem to have acute exacerbation- wheezing was most likely fluid  DM-II- Hemoglobin A1c: 9.4  Insulin-dependent, uncontrolled - continue Levemir 60 units, meal coverage to 10 units 3 times a day, sliding scale insulin  -sugars 110-140'as  Hx of Breast cancer:  -s/p of L mastectomy, chemotherapy and radiation therapy.oncologist -Last seen was 2 months ago, was told that her breast cancer was stable. -Has restriction to LUE 2/2 [place]orange -Follow-up with her oncologist in [place].  Hypertension: Improving today -Continue Coreg 25 bid,  -nitrates imdur 120 daily - hydralazine 100 q 8  GERD: -  Continue PPI  -some implication on varied studies of association wit PNA/CKD  OSA? -de-sat 84% off oxygen when asleepd -been told she needs Bi-pap but has never used -[place]   Left Hip pain has history of osteoarthritis : Feeling better today - hip x ray does not show fracture -  CT of the left hip showed moderate osteoarthritis with very small joint effusion, no acute or subacute abnormality - Place on pain control, increase Neurontin, added oxycodone    Super morbid Obesity Body mass index is 53.2 kg/(m^2). -encourage weight loss, diet control  Code Status: full code  Family Communication: Discussed in detail with the patient, all imaging results, lab results explained to the patient    Disposition Plan: tx to tele on 10/21/14  Time Spent in minutes   25 minutes  Procedures  2d echo Left hip xray Renal US CT of the left hip Cardiac CATH  Consults   Cardiology  Nephrology   DVT Prophylaxis  heparin drip   Medications  Scheduled Meds: . antiseptic oral rinse  7 mL Mouth Rinse BID  . aspirin EC  81 mg Oral Daily  . atorvastatin  40 mg Oral q1800  . carvedilol  25 mg Oral BID WC  . darbepoetin (ARANESP) injection - NON-DIALYSIS  60 mcg Subcutaneous Q Wed-1800  . docusate sodium  100 mg Oral BID  . gabapentin  300 mg Oral QHS  . heparin  5,000 Units Subcutaneous 3 times per day  . hydrALAZINE  100 mg Oral 3 times per day  . insulin aspart  0-15 Units Subcutaneous TID WC  . insulin aspart  0-5 Units Subcutaneous QHS  . insulin aspart  10 Units Subcutaneous TID WC  . insulin detemir  60 Units Subcutaneous Daily  . isosorbide mononitrate  120 mg Oral Daily  . lidocaine  1 patch Transdermal Q24H  . multivitamin with minerals  1 tablet Oral Daily  . pantoprazole  40 mg Oral Daily  . sodium chloride  3 mL Intravenous Q12H  . sodium chloride  3 mL Intravenous Q12H  . sodium chloride  3 mL Intravenous Q12H   Continuous Infusions:   PRN Meds:.sodium chloride, sodium chloride, sodium chloride, acetaminophen, albuterol, capsaicin, guaiFENesin-codeine, ipratropium-albuterol, ondansetron (ZOFRAN) IV, oxyCODONE, polyethylene glycol, sodium chloride, sodium chloride, sodium chloride   Antibiotics   Anti-infectives    Start     Dose/Rate Route Frequency  Ordered Stop   10/21/14 1044  ceFAZolin (ANCEF) 2-3 GM-% IVPB SOLR    CommentsKarlton Lemon, [place]   : cabinet override      10/21/14 1044 10/21/14 2259   10/21/14 0830  ceFAZolin (ANCEF) powder 2 g    Comments:  Give in  IR   2 g Other To Surgery 10/21/14 0829 10/21/14 1050        Subjective:   Passing stool No other c/o Seems ready to do dialysis   Objective:   Blood pressure 150/55, pulse 93, temperature 98.8 F (37.1 C), temperature source Oral, resp. rate 18, height 5\' 5"  (1.651 m), weight 145 kg (319 lb 10.7 oz), SpO2 93 %.  Wt Readings from Last 3 Encounters:  10/22/14 145 kg (319 lb 10.7 oz)     Intake/Output Summary (Last 24 hours) at 10/22/14 1326 Last data filed at 10/22/14 1032  Gross per 24 hour  Intake    940 ml  Output   2950 ml  Net  -2010 ml    Exam  General: Alert and oriented x 3,  NAD. Thick neck  HEENT:  PERRLA, EOMI,  Neck: Supple,    CVS: S1 S2clear, no mrg  Respiratory: Clear to auscultation bilaterally  Abdomen:  obese, soft, NT, ND, NBS  Ext: no cyanosis clubbing, 3+ edema b/l LE   Neuro: grossly wnl  Data Review   Micro Results No results found for this or any previous visit (from the past 240 hour(s)).  Radiology Reports US Renal  10/12/2014   CLINICAL DATA:  Acute renal insufficiency  EXAM: RENAL / URINARY TRACT ULTRASOUND COMPLETE  COMPARISON:  None.  FINDINGS: Right Kidney:  Length: 11.7 cm.  Mildly increased echogenicity.  No hydronephrosis.  Left Kidney:  Length: 12.1 cm.  Mildly increased echogenicity.  No hydronephrosis.  Bladder:  Appears normal for degree of bladder distention.  IMPRESSION: Negative for hydronephrosis. There is mildly increased renal parenchymal echogenicity consistent with medical renal disease.   Electronically Signed   By: Andreas Newport M.D.   On: 10/12/2014 06:29   Ct Hip Left Wo Contrast  10/16/2014   CLINICAL DATA:  Patient presented 10/12/2014 with shortness of breath and syncope and was  found to have a non ST elevation MI. Prior history of left breast cancer post mastectomy. Acute superimposed upon chronic left hip pain. No known injuries.  EXAM: CT OF THE LEFT HIP WITHOUT CONTRAST  TECHNIQUE: Multidetector CT imaging of the left hip was performed according to the standard protocol. Multiplanar CT image reconstructions were also generated.  COMPARISON:  Left hip x-rays 10/13/2014.  FINDINGS: No evidence of acute, subacute or healed fractures. Severe narrowing of the joint space posteriorly with associated hypertrophic spurring involving the posterior column of the acetabulum. Moderate joint space narrowing elsewhere. Very small joint effusion. No evidence of osseous metastatic disease involving the visualized bones comprising the left hip.  IMPRESSION: 1. No acute or subacute osseous abnormality. 2. Moderate osteoarthritis. 3. Very small joint effusion. 4. No evidence of osseous metastatic disease.   Electronically Signed   By: Evangeline Dakin M.D.   On: 10/16/2014 14:15   Ir Fluoro Guide Cv Line Right  10/21/2014   CLINICAL DATA:  Chronic renal insufficiency, needs access for hemodialysis  EXAM: TUNNELED HEMODIALYSIS CATHETER PLACEMENT WITH ULTRASOUND AND FLUOROSCOPIC GUIDANCE  TECHNIQUE: The procedure, risks, benefits, and alternatives were explained to the patient. Questions regarding the procedure were encouraged and answered. The patient understands and consents to the procedure. As antibiotic prophylaxis, cefazolin was ordered pre-procedure and administered intravenously within one hour of incision.Patency of the right IJ vein was confirmed with ultrasound with image documentation. An appropriate skin site was determined. Region was prepped using maximum barrier technique including cap and mask, sterile gown, sterile gloves, large sterile sheet, and Chlorhexidine as cutaneous antisepsis. The region was infiltrated locally with 1% lidocaine.  Intravenous Fentanyl and Versed were  administered as conscious sedation during continuous cardiorespiratory monitoring by the radiology RN, with a total moderate sedation time of 6 minutes.  Under real-time ultrasound guidance, the right IJ vein was accessed with a 21 gauge micropuncture needle; the needle tip within the vein was confirmed with ultrasound image documentation. Needle exchanged over the 018 guidewire for transitional dilator, which allowed advancement of a Benson wire into the IVC. Over this, an MPA catheter was advanced. A Hemosplit 19 hemodialysis catheter was tunneled from the right anterior chest wall approach to the right IJ dermatotomy site. The MPA catheter was exchanged over an Amplatz wire for serial vascular dilators which allow placement of a peel-away sheath, through which  the catheter was advanced under intermittent fluoroscopy, positioned with its tips in the proximal and midright atrium. Spot chest radiograph confirms good catheter position. No pneumothorax. Catheter was flushed and primed per protocol. Catheter secured externally with O Prolene sutures. The right IJ dermatotomy site was closed with Dermabond.  COMPLICATIONS: COMPLICATIONS None immediate  FLUOROSCOPY TIME:  30 seconds, 22 mGy  COMPARISON:  None  IMPRESSION: 1. Technically successful placement of tunneled right IJ hemodialysis catheter with ultrasound and fluoroscopic guidance. Ready for routine use.  ACCESS: Remains approachable for percutaneous intervention as needed.   Electronically Signed   By: Lucrezia Europe M.D.   On: 10/21/2014 11:15   Ir US Guide Vasc Access Right  10/21/2014   CLINICAL DATA:  Chronic renal insufficiency, needs access for hemodialysis  EXAM: TUNNELED HEMODIALYSIS CATHETER PLACEMENT WITH ULTRASOUND AND FLUOROSCOPIC GUIDANCE  TECHNIQUE: The procedure, risks, benefits, and alternatives were explained to the patient. Questions regarding the procedure were encouraged and answered. The patient understands and consents to the procedure. As  antibiotic prophylaxis, cefazolin was ordered pre-procedure and administered intravenously within one hour of incision.Patency of the right IJ vein was confirmed with ultrasound with image documentation. An appropriate skin site was determined. Region was prepped using maximum barrier technique including cap and mask, sterile gown, sterile gloves, large sterile sheet, and Chlorhexidine as cutaneous antisepsis. The region was infiltrated locally with 1% lidocaine.  Intravenous Fentanyl and Versed were administered as conscious sedation during continuous cardiorespiratory monitoring by the radiology RN, with a total moderate sedation time of 6 minutes.  Under real-time ultrasound guidance, the right IJ vein was accessed with a 21 gauge micropuncture needle; the needle tip within the vein was confirmed with ultrasound image documentation. Needle exchanged over the 018 guidewire for transitional dilator, which allowed advancement of a Benson wire into the IVC. Over this, an MPA catheter was advanced. A Hemosplit 19 hemodialysis catheter was tunneled from the right anterior chest wall approach to the right IJ dermatotomy site. The MPA catheter was exchanged over an Amplatz wire for serial vascular dilators which allow placement of a peel-away sheath, through which the catheter was advanced under intermittent fluoroscopy, positioned with its tips in the proximal and midright atrium. Spot chest radiograph confirms good catheter position. No pneumothorax. Catheter was flushed and primed per protocol. Catheter secured externally with O Prolene sutures. The right IJ dermatotomy site was closed with Dermabond.  COMPLICATIONS: COMPLICATIONS None immediate  FLUOROSCOPY TIME:  30 seconds, 22 mGy  COMPARISON:  None  IMPRESSION: 1. Technically successful placement of tunneled right IJ hemodialysis catheter with ultrasound and fluoroscopic guidance. Ready for routine use.  ACCESS: Remains approachable for percutaneous intervention  as needed.   Electronically Signed   By: Lucrezia Europe M.D.   On: 10/21/2014 11:15   Dg Chest Port 1 View  10/15/2014   CLINICAL DATA:  CHF  EXAM: PORTABLE CHEST - 1 VIEW  COMPARISON:  10/12/2014  FINDINGS: Cardiomegaly with mild perihilar edema, improved. Left lung base is obscured, likely a combination of atelectasis and small pleural effusion.  IMPRESSION: Cardiomegaly with mild perihilar edema, improved.  Suspected small left pleural effusion.   Electronically Signed   By: Julian Hy M.D.   On: 10/15/2014 08:53   Dg Chest Portable 1 View  10/12/2014   CLINICAL DATA:  Respiratory distress  EXAM: PORTABLE CHEST - 1 VIEW  COMPARISON:  None.  FINDINGS: Central and basilar airspace opacities are present bilaterally. There is cardiomegaly. There probably is a small left  pleural effusion.  IMPRESSION: Extensive airspace opacities and central and basilar distribution. This may represent alveolar edema associated with congestive heart failure. Probable left effusion. Cannot exclude infectious infiltrates.   Electronically Signed   By: Andreas Newport M.D.   On: 10/12/2014 01:51   Dg Hip Unilat With Pelvis 2-3 Views Left  10/13/2014   CLINICAL DATA:  Left hip pain.  EXAM: LEFT HIP (WITH PELVIS) 2-3 VIEWS  COMPARISON:  None.  FINDINGS: The left hip is located. No acute bone or soft tissue abnormality is present. Degenerative changes are noted in the SI joints bilaterally and lower lumbar spine.  IMPRESSION: 1. Normal radiographic appearance of the left hip. 2. Degenerative changes within the lower lumbar spine and SI joints.   Electronically Signed   By: San Morelle M.D.   On: 10/13/2014 09:48    CBC  Recent Labs Lab 10/18/14 0236 10/19/14 0700 10/20/14 0545 10/21/14 0340 10/21/14 2315  WBC 10.7* 10.5 9.1 8.6 9.1  HGB 8.4* 8.5* 7.8* 7.5* 7.8*  HCT 27.2* 26.6* 25.6* 25.1* 25.2*  PLT 204 204 199 195 225  MCV 82.4 83.1 84.2 84.5 85.1  MCH 25.5* 26.6 25.7* 25.3* 26.4  MCHC 30.9 32.0  30.5 29.9* 31.0  RDW 13.9 14.3 14.7 14.8 15.0  LYMPHSABS  --   --   --   --  1.2  MONOABS  --   --   --   --  1.0  EOSABS  --   --   --   --  0.1  BASOSABS  --   --   --   --  0.0    Chemistries   Recent Labs Lab 10/19/14 0700 10/20/14 0545 10/20/14 1426 10/21/14 0500 10/21/14 2315  NA 134* 134* 134* 136 138  K 5.1 5.9* 5.1 4.9 4.5  CL 97* 98* 97* 97* 99*  CO2 26 27 28 28 30   GLUCOSE 184* 202* 200* 143* 141*  BUN 90* 92* 95* 96* 96*  CREATININE 4.49* 4.79* 4.96* 5.27* 4.93*  CALCIUM 6.8* 7.0* 6.9* 7.1* 7.1*   ------------------------------------------------------------------------------------------------------------------ estimated creatinine clearance is 15.2 mL/min (by C-G formula based on Cr of 4.93). ------------------------------------------------------------------------------------------------------------------ No results for input(s): HGBA1C in the last 72 hours. ------------------------------------------------------------------------------------------------------------------ No results for input(s): CHOL, HDL, LDLCALC, TRIG, CHOLHDL, LDLDIRECT in the last 72 hours. ------------------------------------------------------------------------------------------------------------------ No results for input(s): TSH, T4TOTAL, T3FREE, THYROIDAB in the last 72 hours.  Invalid input(s): FREET3 ------------------------------------------------------------------------------------------------------------------  Recent Labs  10/19/14 1520  FERRITIN 296  TIBC 221*  IRON 60    Coagulation profile  Recent Labs Lab 10/21/14 0340  INR 1.05    No results for input(s): DDIMER in the last 72 hours.  Cardiac Enzymes No results for input(s): CKMB, TROPONINI, MYOGLOBIN in the last 168 hours.  Invalid input(s): CK ------------------------------------------------------------------------------------------------------------------ Invalid input(s): [place]   10/21/14 1222 10/21/14 1702 10/21/14 2138 10/22/14 0305 10/22/14 0556 10/22/14 1111  GLUCAP 107* 210* 159* 110* 137* 149*    Verneita Griffes, MD [facility] [phone]

## 2014-10-22 NOTE — Progress Notes (Signed)
Patient ID: Jordan [name], female   DOB: 14-Sep-1940, 74 y.o.   MRN: [id number]  [facility] Progress Note    Assessment/ Plan:   1. 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 Started on hemodialysis yesterday and will get her second treatment today (third tomorrow). Appreciate input from vascular surgery with possible plans for right brachiocephalic fistula this coming Tuesday. Will attempt to optimize volume status until then. 2. Non-ST elevation myocardial infarction: on optimized medical management and now status post coronary angiogram-may need repeat study now that she has ESRD and contrast load not restricted.  3. Hypertension: Blood pressure is intermittently elevated-anticipate will improve with ultrafiltration on hemodialysis. Volume overload/anasarca: Discontinue furosemide, continue ultrafiltration with hemodialysis 5. Anemia: Iron stores appear adequate, given aranesp for management of low hemoglobin  Subjective:   Tolerated dialysis well yesterday-no problems overnight    Objective:   BP 150/55 mmHg  Pulse 93  Temp(Src) 98.8 F (37.1 C) (Oral)  Resp 18  Ht 5\' 5"  (1.651 m)  Wt 145 kg (319 lb 10.7 oz)  BMI 53.20 kg/m2  SpO2 93%  Intake/Output Summary (Last 24 hours) at 10/22/14 0840 Last data filed at 10/22/14 0600  Gross per 24 hour  Intake    700 ml  Output   2750 ml  Net  -2050 ml   Weight change: -1.054 kg (-2 lb 5.2 oz)  Physical Exam: [id number] resting in recliner  CVS: Regular rate and rhythm, S1 and S2 normal Resp: Clear to auscultation-no distinct rales or rhonchi Abd: Soft, obese, nontender Ext: 2+ lower extremity edema  Imaging: Ir Fluoro Guide Cv Line Right  10/21/2014   CLINICAL DATA:  Chronic renal insufficiency, needs access for hemodialysis  EXAM: TUNNELED  HEMODIALYSIS CATHETER PLACEMENT WITH ULTRASOUND AND FLUOROSCOPIC GUIDANCE  TECHNIQUE: The procedure, risks, benefits, and alternatives were explained to the patient. Questions regarding the procedure were encouraged and answered. The patient understands and consents to the procedure. As antibiotic prophylaxis, cefazolin was ordered pre-procedure and administered intravenously within one hour of incision.Patency of the right IJ vein was confirmed with ultrasound with image documentation. An appropriate skin site was determined. Region was prepped using maximum barrier technique including cap and mask, sterile gown, sterile gloves, large sterile sheet, and Chlorhexidine as cutaneous antisepsis. The region was infiltrated locally with 1% lidocaine.  Intravenous Fentanyl and Versed were administered as conscious sedation during continuous cardiorespiratory monitoring by the radiology RN, with a total moderate sedation time of 6 minutes.  Under real-time ultrasound guidance, the right IJ vein was accessed with a 21 gauge micropuncture needle; the needle tip within the vein was confirmed with ultrasound image documentation. Needle exchanged over the 018 guidewire for transitional dilator, which allowed advancement of a Benson wire into the IVC. Over this, an MPA catheter was advanced. A Hemosplit 19 hemodialysis catheter was tunneled from the right anterior chest wall approach to the right IJ dermatotomy site. The MPA catheter was exchanged over an Amplatz wire for serial vascular dilators which allow placement of a peel-away sheath, through which the catheter was advanced under intermittent fluoroscopy, positioned with its tips in the proximal and midright atrium. Spot chest radiograph confirms good catheter position. No pneumothorax. Catheter was flushed and primed per protocol. Catheter secured externally with O Prolene sutures. The right IJ dermatotomy site was closed with Dermabond.  COMPLICATIONS: COMPLICATIONS None  immediate  FLUOROSCOPY  TIME:  30 seconds, 22 mGy  COMPARISON:  None  IMPRESSION: 1. Technically successful placement of tunneled right IJ hemodialysis catheter with ultrasound and fluoroscopic guidance. Ready for routine use.  ACCESS: Remains approachable for percutaneous intervention as needed.   Electronically Signed   By: Lucrezia Europe M.D.   On: 10/21/2014 11:15   Ir US Guide Vasc Access Right  10/21/2014   CLINICAL DATA:  Chronic renal insufficiency, needs access for hemodialysis  EXAM: TUNNELED HEMODIALYSIS CATHETER PLACEMENT WITH ULTRASOUND AND FLUOROSCOPIC GUIDANCE  TECHNIQUE: The procedure, risks, benefits, and alternatives were explained to the patient. Questions regarding the procedure were encouraged and answered. The patient understands and consents to the procedure. As antibiotic prophylaxis, cefazolin was ordered pre-procedure and administered intravenously within one hour of incision.Patency of the right IJ vein was confirmed with ultrasound with image documentation. An appropriate skin site was determined. Region was prepped using maximum barrier technique including cap and mask, sterile gown, sterile gloves, large sterile sheet, and Chlorhexidine as cutaneous antisepsis. The region was infiltrated locally with 1% lidocaine.  Intravenous Fentanyl and Versed were administered as conscious sedation during continuous cardiorespiratory monitoring by the radiology RN, with a total moderate sedation time of 6 minutes.  Under real-time ultrasound guidance, the right IJ vein was accessed with a 21 gauge micropuncture needle; the needle tip within the vein was confirmed with ultrasound image documentation. Needle exchanged over the 018 guidewire for transitional dilator, which allowed advancement of a Benson wire into the IVC. Over this, an MPA catheter was advanced. A Hemosplit 19 hemodialysis catheter was tunneled from the right anterior chest wall approach to the right IJ dermatotomy site. The MPA catheter  was exchanged over an Amplatz wire for serial vascular dilators which allow placement of a peel-away sheath, through which the catheter was advanced under intermittent fluoroscopy, positioned with its tips in the proximal and midright atrium. Spot chest radiograph confirms good catheter position. No pneumothorax. Catheter was flushed and primed per protocol. Catheter secured externally with O Prolene sutures. The right IJ dermatotomy site was closed with Dermabond.  COMPLICATIONS: COMPLICATIONS None immediate  FLUOROSCOPY TIME:  30 seconds, 22 mGy  COMPARISON:  None  IMPRESSION: 1. Technically successful placement of tunneled right IJ hemodialysis catheter with ultrasound and fluoroscopic guidance. Ready for routine use.  ACCESS: Remains approachable for percutaneous intervention as needed.   Electronically Signed   By: Lucrezia Europe M.D.   On: 10/21/2014 11:15    Labs: BMET  Recent Labs Lab 10/17/14 0250 10/18/14 0236 10/19/14 0700 10/20/14 0545 10/20/14 1426 10/21/14 0500 10/21/14 2315  NA 137 133* 134* 134* 134* 136 138  K 4.7 4.8 5.1 5.9* 5.1 4.9 4.5  CL 96* 94* 97* 98* 97* 97* 99*  CO2 31 27 26 27 28 28 30   GLUCOSE 232* 154* 184* 202* 200* 143* 141*  BUN 72* 80* 90* 92* 95* 96* 96*  CREATININE 3.99* 4.39* 4.49* 4.79* 4.96* 5.27* 4.93*  CALCIUM 7.2* 6.9* 6.8* 7.0* 6.9* 7.1* 7.1*  PHOS  --   --   --   --   --  6.5* 5.9*   CBC  Recent Labs Lab 10/19/14 0700 10/20/14 0545 10/21/14 0340 10/21/14 2315  WBC 10.5 9.1 8.6 9.1  NEUTROABS  --   --   --  6.8  HGB 8.5* 7.8* 7.5* 7.8*  HCT 26.6* 25.6* 25.1* 25.2*  MCV 83.1 84.2 84.5 85.1  PLT 204 199 195 225    Medications:    . antiseptic oral  rinse  7 mL Mouth Rinse BID  . aspirin EC  81 mg Oral Daily  . atorvastatin  40 mg Oral q1800  . carvedilol  25 mg Oral BID WC  . darbepoetin (ARANESP) injection - NON-DIALYSIS  60 mcg Subcutaneous Q Wed-1800  . docusate sodium  100 mg Oral BID  . furosemide  80 mg Intravenous Q12H  .  gabapentin  300 mg Oral BID  . heparin  5,000 Units Subcutaneous 3 times per day  . hydrALAZINE  100 mg Oral 3 times per day  . insulin aspart  0-15 Units Subcutaneous TID WC  . insulin aspart  0-5 Units Subcutaneous QHS  . insulin aspart  10 Units Subcutaneous TID WC  . insulin detemir  60 Units Subcutaneous Daily  . isosorbide mononitrate  120 mg Oral Daily  . lidocaine  1 patch Transdermal Q24H  . multivitamin with minerals  1 tablet Oral Daily  . pantoprazole  40 mg Oral Daily  . sodium chloride  3 mL Intravenous Q12H  . sodium chloride  3 mL Intravenous Q12H  . sodium chloride  3 mL Intravenous Q12H   Elmarie Shiley, MD 10/22/2014, 8:40 AM

## 2014-10-22 NOTE — Progress Notes (Signed)
Occupational Therapy Treatment Patient Details Name: Jordan [name] MRN: [id number] DOB: December 18, 1940 Today's Date: 10/22/2014    History of present illness 74 y.o. female with PMH of hypertension, diabetes mellitus, GERD, breast cancer, COPD, arthritis, chronic kidney disease (unknown stage), chronic leg edema, who presents with shortness of breath and syncope. s/p cardiac cath 10/19/14, HD initiated since admission, awaiting permanent AVG. Pt visiting from [place] for 3 weeks where she plans to return to care for sister s/p CVA and her spouse is in a SNF in [place].    OT comments  Pt with fatigue. Agreeable to seated ADL. Pt without recall of energy conservation education provided last session, will continue instruction.  LEs remain painful and limiting standing tolerance.  Follow Up Recommendations  Home health OT    Equipment Recommendations       Recommendations for Other Services      Precautions / Restrictions Precautions Precautions: Fall Precaution Comments: watch sats       Mobility Bed Mobility Overal bed mobility: Modified Independent             General bed mobility comments: pt up in chair  Transfers Overall transfer level: Needs assistance Equipment used: Rolling walker (2 wheeled) Transfers: Sit to/from Stand Sit to Stand: Min guard Stand pivot transfers: Min guard       General transfer comment: cues for hand placement    Balance Overall balance assessment: Needs assistance   Sitting balance-Leahy Scale: Good       Standing balance-Leahy Scale: Poor                     ADL Overall ADL's : Needs assistance/impaired     Grooming: Wash/dry hands;Wash/dry face;Oral care;Brushing hair;Set up;Sitting                   Toilet Transfer: Min guard;Stand-pivot;BSC;RW   Toileting- Clothing Manipulation and Hygiene: Total assistance;Sit to/from stand                Vision                     Perception     Praxis       Cognition   Behavior During Therapy: [facility] for tasks assessed/performed Overall Cognitive Status: Within Functional Limits for tasks assessed                       Extremity/Trunk Assessment               Exercises    Shoulder Instructions       General Comments      Pertinent Vitals/ Pain       Pain Assessment: Faces Pain Score: 5  Faces Pain Scale: Hurts even more Pain Location: LEs Pain Descriptors / Indicators: Aching Pain Intervention(s): Limited activity within patient's tolerance;Monitored during session;Repositioned  Home Living                                          Prior Functioning/Environment              Frequency Min 2X/week     Progress Toward Goals  OT Goals(current goals can now be found in the care plan section)  Progress towards OT goals: Progressing toward goals  Acute Rehab OT Goals Patient Stated Goal: return to [place] Time For Goal Achievement:  10/26/14 Potential to Achieve Goals: Good  Plan Discharge plan remains appropriate    Co-evaluation                 End of Session Equipment Utilized During Treatment: Oxygen   Activity Tolerance Patient limited by fatigue   Patient Left in chair;with call bell/phone within reach   Nurse Communication          Time: 1010-1030 OT Time Calculation (min): 20 min  Charges: OT General Charges $OT Visit: 1 Procedure OT Treatments $Self Care/Home Management : 8-22 mins  Malka So 10/22/2014, 10:40 AM

## 2014-10-22 NOTE — Progress Notes (Signed)
Physical Therapy Treatment Patient Details Name: Jordan [name] MRN: [id number] DOB: September 23, 1940 Today's Date: 10/22/2014    History of Present Illness 74 y.o. female with PMH of hypertension, diabetes mellitus, GERD, breast cancer, COPD, arthritis, chronic kidney disease (unknown stage), chronic leg edema, who presents with shortness of breath and syncope. Pt visiting from [place] for 3 weeks where she plans to return to care for sister s/p CVA and her spouse is in a SNF in [place].     PT Comments    Pt able to ambulate limited distance and perform HEP today although still fatigued and not back to baseline function. Pt states increased fatigue after HD but willing to progress as she still wants to return to [place] soon. Encouraged daily ambulation and HEP. Will continue to follow.   Follow Up Recommendations  Home health PT;Supervision/Assistance - 24 hour     Equipment Recommendations       Recommendations for Other Services       Precautions / Restrictions Precautions Precautions: Fall Precaution Comments: watch sats    Mobility  Bed Mobility Overal bed mobility: Modified Independent             General bed mobility comments: increased time with rail and momentum  Transfers     Transfers: Sit to/from Stand Sit to Stand: Min guard         General transfer comment: cues for hand placement with transition from bed and recliner  Ambulation/Gait Ambulation/Gait assistance: Min guard Ambulation Distance (Feet): 28 Feet Assistive device: Rolling walker (2 wheeled) Gait Pattern/deviations: Step-through pattern;Decreased stride length;Trunk flexed   Gait velocity interpretation: Below normal speed for age/gender General Gait Details: 75' then 28' after seated rest. pt propping on Left forearm initial gait trial but able to stand upright next trial. Pt with cues for posture, looking up, position in RW and safety with chair to follow due to fatigue   Stairs             Wheelchair Mobility    Modified Rankin (Stroke Patients Only)       Balance Overall balance assessment: Needs assistance   Sitting balance-Leahy Scale: Good       Standing balance-Leahy Scale: Poor                      Cognition Arousal/Alertness: Awake/alert Behavior During Therapy: WFL for tasks assessed/performed Overall Cognitive Status: Within Functional Limits for tasks assessed                      Exercises General Exercises - Lower Extremity Long Arc Quad: AROM;Seated;Both;20 reps Hip Flexion/Marching: AROM;Seated;Both;20 reps Toe Raises: AROM;Seated;Both;20 reps Heel Raises: AROM;Seated;Both;20 reps    General Comments        Pertinent Vitals/Pain Pain Score: 5  Pain Location: left hip Pain Descriptors / Indicators: Aching Pain Intervention(s): Limited activity within patient's tolerance;Repositioned;Premedicated before session  HR 76-93 with activity sats 94% on 2L at rest with drop to 85% on gait with rise to 93% with seated rest, cues for breathing and maintained on 3L with second gait trial    Home Living                      Prior Function            PT Goals (current goals can now be found in the care plan section) Progress towards PT goals: Progressing toward goals    Frequency  PT Plan Current plan remains appropriate    Co-evaluation             End of Session Equipment Utilized During Treatment: Oxygen Activity Tolerance: Patient limited by fatigue Patient left: in chair;with call bell/phone within reach     Time: 0747-0810 PT Time Calculation (min) (ACUTE ONLY): 23 min  Charges:  $Gait Training: 8-22 mins $Therapeutic Exercise: 8-22 mins                    G Codes:      Melford Aase 2014-11-05, 8:18 AM Elwyn Reach, [place]

## 2014-10-22 NOTE — Procedures (Signed)
Patient seen on Hemodialysis. QB 350, UF goal 3.5L Treatment adjusted as needed.  Elmarie Shiley MD [facility]. Office # [phone] Pager # [phone] 4:29 PM

## 2014-10-22 NOTE — Progress Notes (Signed)
Pt return from HD via bed.  A&0x4.   Denies pain/discomfort.  Call bell at reach.  Instructed to call for assist. As needed.  Verbalized understanding.  Will continue to monitor.  Karie Kirks, Therapist, sports.

## 2014-10-23 LAB — RENAL FUNCTION PANEL
ANION GAP: 7 (ref 5–15)
Albumin: 2.6 g/dL — ABNORMAL LOW (ref 3.5–5.0)
BUN: 33 mg/dL — AB (ref 6–20)
CHLORIDE: 97 mmol/L — AB (ref 101–111)
CO2: 30 mmol/L (ref 22–32)
CREATININE: 3.35 mg/dL — AB (ref 0.44–1.00)
Calcium: 7.6 mg/dL — ABNORMAL LOW (ref 8.9–10.3)
GFR calc non Af Amer: 13 mL/min — ABNORMAL LOW (ref >60)
GFR, EST AFRICAN AMERICAN: 15 mL/min — AB (ref >60)
Glucose, Bld: 212 mg/dL — ABNORMAL HIGH (ref 65–99)
Phosphorus: 3.8 mg/dL (ref 2.5–4.6)
Potassium: 4.2 mmol/L (ref 3.5–5.1)
Sodium: 134 mmol/L — ABNORMAL LOW (ref 135–145)

## 2014-10-23 LAB — HEPATITIS B SURFACE ANTIBODY,QUALITATIVE: Hep B S Ab: NONREACTIVE

## 2014-10-23 LAB — CBC
HEMATOCRIT: 24.5 % — AB (ref 36.0–46.0)
HEMOGLOBIN: 7.4 g/dL — AB (ref 12.0–15.0)
MCH: 25.9 pg — AB (ref 26.0–34.0)
MCHC: 30.2 g/dL (ref 30.0–36.0)
MCV: 85.7 fL (ref 78.0–100.0)
PLATELETS: 199 10*3/uL (ref 150–400)
RBC: 2.86 MIL/uL — AB (ref 3.87–5.11)
RDW: 15.1 % (ref 11.5–15.5)
WBC: 11.6 10*3/uL — AB (ref 4.0–10.5)

## 2014-10-23 LAB — GLUCOSE, CAPILLARY
Glucose-Capillary: 129 mg/dL — ABNORMAL HIGH (ref 65–99)
Glucose-Capillary: 137 mg/dL — ABNORMAL HIGH (ref 65–99)
Glucose-Capillary: 225 mg/dL — ABNORMAL HIGH (ref 65–99)
Glucose-Capillary: 110 mg/dL — ABNORMAL HIGH (ref 65–99)

## 2014-10-23 LAB — HEPATITIS B CORE ANTIBODY, TOTAL: Hep B Core Total Ab: NEGATIVE

## 2014-10-23 MED ORDER — HEPARIN SODIUM (PORCINE) 1000 UNIT/ML DIALYSIS
40.0000 [IU]/kg | INTRAMUSCULAR | Status: DC | PRN
  Filled 2014-10-23: qty 6

## 2014-10-23 NOTE — Progress Notes (Signed)
Patient is not showing any apparent signs of respiratory distress tonnight, spoke with patient about BIPAP order QHS and she says she will call if she needs it. RT will continue to monitor.

## 2014-10-23 NOTE — Progress Notes (Signed)
Brief HPI    74 y.o.? from [place] visiting family with PMH of hypertension, diabetes mellitus ty II, GERD, breast cancer s/p CHemo-XRT, COPD, arthritis, chronic kidney disease (unknown stage), chronic leg edema--she states this had been going on for 4-5 yrs OPTA [not worsened by chemo-XRT]  Admitted 10/12/14 c  shortness of breath and syncope. She had been doing fine until 11:30 on night of admission when she started having SOB. -cough and - chest pain.   Per her daughter, patient did some exertion, then she passed out for few seconds-LOC witnessed by daughter O2 sat 60% on arrival She did not have seizure. No head injury.   On Bumex for chronic leg edema, but denies congestive heart failure.  She states that her leg swelling has been worsening recently.   BNP of 143, initial troponin was negative, afebrile, slight bradycardia, elevated creatinine at 3.60 (baseline unknown) and BUN at 42. ABG showed pH 7.206, PCO2 64.1, PO2 252. Chest x-ray showed alvola edema. U/A negative. Renal ultrasound negative for hydronephrosis. . 2D echo doneand her 2nd and 3rd troponins both returned elevated at 0.51 and 0.58. EKG shows SR with LBBB (no prior EKGs to compare to).  Patient was found to have NSTEMI and had Cardiac cath 10/19/14 Venous mapping for perm access doen and had Temp Dialysis cath placed in RU chest  Getting dilaysis now     Assessment & Plan    Acute on chronic hypoxic respiratory failure likely due to NSTEMI and acute on chronic systolic CHF - Progressively improving  Acute on chronic systolic CHF exacerbation:  Patient has left bundle branch block of unknown duration, troponin elevation -not candidate for PPM -Echo from 5/16 in [place] shows 55-60%- no wall abnormalities, 2-D echo 6/28 showed EF of 30% with akinesis of inferior septum, akinesis of inferior wall, hypokinesis of anterior septum and akinesis of anterior wall -Not a candidate for ACEI/ARB due to chronic  kidney disease -Continue Coreg, nitrates, hydralazine -i/o = -7.18 so far -dialysis for volume: cardiology did d/c lasix on 7/8  Anemia-normocytic and probably of chronic disease iron/TIBC ratio above 18% -If below 7 would transfuse -Iron studies done 7/5 - Aranesp per Renal  NSTEMI - 2-D echo 6/28 showed EF of 30% with akinesis of inferior septum, akinesis of inferior wall, hypokinesis of anterior septum and akinesis of anterior wall -Cardiac cath 10/19/14  Mild nonobstructive CAD  Severe pulmonary HTN  Elevated LV filling presss.  Normal cardiac output  AKI on CKD (chronic kidney disease):  -baseline appears to be: 3 1/16, underlying stage IV CKD -US-renal- medical renal disease - Nephrology input appreciated, creatinine continues to ? and GFR ? - ist hemodialysis 10/21/14---Access has been placed R upper Chest 10/21/14 and patientfor venous mapping -Transient hyperkalemia Rx with Kayexalate per Nephro -For Optimization per Dr. Posey Pronto with dialysis and then maybe Vascular access this coming Tuesday -nutritionist to see prior to d/c to teach about Renal diet  COPD:  - Does not seem to have acute exacerbation- wheezing was most likely fluid  DM-II- Hemoglobin A1c: 9.4  Insulin-dependent, uncontrolled - continue Levemir 60 units, meal coverage to 10 units 3 times a day, sliding scale insulin  -sugars 110-140'as  Hx of Breast cancer:  -s/p of L mastectomy, chemotherapy and radiation therapy.oncologist -Last seen was 2 months ago, was told that her breast cancer was stable. -Has restriction to LUE 2/2 [place]orange -Follow-up with her oncologist in [place].  Hypertension: Improving today -  Continue Coreg 25 bid,  -nitrates imdur 120 daily - hydralazine 100 q 8  GERD: -Continue PPI  -some implication on varied studies of association wit PNA/CKD  OSA? -de-sat 84% off oxygen when asleep -been told she needs Bi-pap but has never used -desat screen ordered  Left Hip pain has  history of osteoarthritis : Feeling better today - hip x ray does not show fracture - CT of the left hip showed moderate osteoarthritis with very small joint effusion, no acute or subacute abnormality - Place on pain control, increase Neurontin, added oxycodone    Super morbid Obesity Body mass index is 50.99 kg/(m^2). -encourage weight loss, diet control  Code Status: full code  Family Communication: Discussed in detail with the patient, all imaging results, lab results explained to the patient    Disposition Plan: tx to tele on 10/21/14  Time Spent in minutes   25 minutes  Procedures  2d echo Left hip xray Renal US CT of the left hip Cardiac CATH  Consults   Cardiology  Nephrology   DVT Prophylaxis  heparin drip   Medications  Scheduled Meds: . antiseptic oral rinse  7 mL Mouth Rinse BID  . aspirin EC  81 mg Oral Daily  . atorvastatin  40 mg Oral q1800  . carvedilol  25 mg Oral BID WC  . darbepoetin (ARANESP) injection - NON-DIALYSIS  60 mcg Subcutaneous Q Wed-1800  . docusate sodium  100 mg Oral BID  . gabapentin  300 mg Oral QHS  . heparin  5,000 Units Subcutaneous 3 times per day  . hydrALAZINE  100 mg Oral 3 times per day  . insulin aspart  0-15 Units Subcutaneous TID WC  . insulin aspart  0-5 Units Subcutaneous QHS  . insulin aspart  10 Units Subcutaneous TID WC  . insulin detemir  60 Units Subcutaneous Daily  . isosorbide mononitrate  120 mg Oral Daily  . lidocaine  1 patch Transdermal Q24H  . multivitamin with minerals  1 tablet Oral Daily  . pantoprazole  40 mg Oral Daily  . sodium chloride  3 mL Intravenous Q12H  . sodium chloride  3 mL Intravenous Q12H  . sodium chloride  3 mL Intravenous Q12H   Continuous Infusions:   PRN Meds:.sodium chloride, sodium chloride, sodium chloride, acetaminophen, albuterol, capsaicin, guaiFENesin-codeine, ipratropium-albuterol, ondansetron (ZOFRAN) IV, oxyCODONE, polyethylene glycol, sodium chloride, sodium chloride,  sodium chloride   Antibiotics   Anti-infectives    Start     Dose/Rate Route Frequency Ordered Stop   10/21/14 1044  ceFAZolin (ANCEF) 2-3 GM-% IVPB SOLR    CommentsKarlton Lemon, [place]   : cabinet override      10/21/14 1044 10/21/14 2259   10/21/14 0830  ceFAZolin (ANCEF) powder 2 g    Comments:  Give in  IR   2 g Other To Surgery 10/21/14 0829 10/21/14 1050        Subjective:   Well No other comoplaitns 3.5 liters off dialysis today without issue NO appetite   Objective:   Blood pressure 126/47, pulse 74, temperature 99.3 F (37.4 C), temperature source Oral, resp. rate 20, height 5\' 5"  (1.651 m), weight 139 kg (306 lb 7 oz), SpO2 98 %.  Wt Readings from Last 3 Encounters:  10/23/14 139 kg (306 lb 7 oz)     Intake/Output Summary (Last 24 hours) at 10/23/14 1631 Last data filed at 10/23/14 1234  Gross per 24 hour  Intake    840 ml  Output  6645 ml  Net  -5805 ml    Exam  General: Alert and oriented x 3, NAD. Thick neck  HEENT:  PERRLA, EOMI,  Neck: Supple,    CVS: S1 S2clear, no mrg  Respiratory: Clear to auscultation bilaterally  Abdomen:  obese, soft, NT, ND, NBS  Ext: no cyanosis clubbing, 3+ edema b/l LE   Neuro: grossly wnl  Data Review   Micro Results No results found for this or any previous visit (from the past 240 hour(s)).  Radiology Reports US Renal  10/12/2014   CLINICAL DATA:  Acute renal insufficiency  EXAM: RENAL / URINARY TRACT ULTRASOUND COMPLETE  COMPARISON:  None.  FINDINGS: Right Kidney:  Length: 11.7 cm.  Mildly increased echogenicity.  No hydronephrosis.  Left Kidney:  Length: 12.1 cm.  Mildly increased echogenicity.  No hydronephrosis.  Bladder:  Appears normal for degree of bladder distention.  IMPRESSION: Negative for hydronephrosis. There is mildly increased renal parenchymal echogenicity consistent with medical renal disease.   Electronically Signed   By: Andreas Newport M.D.   On: 10/12/2014 06:29   Ct Hip Left Wo  Contrast  10/16/2014   CLINICAL DATA:  Patient presented 10/12/2014 with shortness of breath and syncope and was found to have a non ST elevation MI. Prior history of left breast cancer post mastectomy. Acute superimposed upon chronic left hip pain. No known injuries.  EXAM: CT OF THE LEFT HIP WITHOUT CONTRAST  TECHNIQUE: Multidetector CT imaging of the left hip was performed according to the standard protocol. Multiplanar CT image reconstructions were also generated.  COMPARISON:  Left hip x-rays 10/13/2014.  FINDINGS: No evidence of acute, subacute or healed fractures. Severe narrowing of the joint space posteriorly with associated hypertrophic spurring involving the posterior column of the acetabulum. Moderate joint space narrowing elsewhere. Very small joint effusion. No evidence of osseous metastatic disease involving the visualized bones comprising the left hip.  IMPRESSION: 1. No acute or subacute osseous abnormality. 2. Moderate osteoarthritis. 3. Very small joint effusion. 4. No evidence of osseous metastatic disease.   Electronically Signed   By: Evangeline Dakin M.D.   On: 10/16/2014 14:15   Ir Fluoro Guide Cv Line Right  10/21/2014   CLINICAL DATA:  Chronic renal insufficiency, needs access for hemodialysis  EXAM: TUNNELED HEMODIALYSIS CATHETER PLACEMENT WITH ULTRASOUND AND FLUOROSCOPIC GUIDANCE  TECHNIQUE: The procedure, risks, benefits, and alternatives were explained to the patient. Questions regarding the procedure were encouraged and answered. The patient understands and consents to the procedure. As antibiotic prophylaxis, cefazolin was ordered pre-procedure and administered intravenously within one hour of incision.Patency of the right IJ vein was confirmed with ultrasound with image documentation. An appropriate skin site was determined. Region was prepped using maximum barrier technique including cap and mask, sterile gown, sterile gloves, large sterile sheet, and Chlorhexidine as cutaneous  antisepsis. The region was infiltrated locally with 1% lidocaine.  Intravenous Fentanyl and Versed were administered as conscious sedation during continuous cardiorespiratory monitoring by the radiology RN, with a total moderate sedation time of 6 minutes.  Under real-time ultrasound guidance, the right IJ vein was accessed with a 21 gauge micropuncture needle; the needle tip within the vein was confirmed with ultrasound image documentation. Needle exchanged over the 018 guidewire for transitional dilator, which allowed advancement of a Benson wire into the IVC. Over this, an MPA catheter was advanced. A Hemosplit 19 hemodialysis catheter was tunneled from the right anterior chest wall approach to the right IJ dermatotomy site. The MPA catheter was  exchanged over an Amplatz wire for serial vascular dilators which allow placement of a peel-away sheath, through which the catheter was advanced under intermittent fluoroscopy, positioned with its tips in the proximal and midright atrium. Spot chest radiograph confirms good catheter position. No pneumothorax. Catheter was flushed and primed per protocol. Catheter secured externally with O Prolene sutures. The right IJ dermatotomy site was closed with Dermabond.  COMPLICATIONS: COMPLICATIONS None immediate  FLUOROSCOPY TIME:  30 seconds, 22 mGy  COMPARISON:  None  IMPRESSION: 1. Technically successful placement of tunneled right IJ hemodialysis catheter with ultrasound and fluoroscopic guidance. Ready for routine use.  ACCESS: Remains approachable for percutaneous intervention as needed.   Electronically Signed   By: Lucrezia Europe M.D.   On: 10/21/2014 11:15   Ir US Guide Vasc Access Right  10/21/2014   CLINICAL DATA:  Chronic renal insufficiency, needs access for hemodialysis  EXAM: TUNNELED HEMODIALYSIS CATHETER PLACEMENT WITH ULTRASOUND AND FLUOROSCOPIC GUIDANCE  TECHNIQUE: The procedure, risks, benefits, and alternatives were explained to the patient. Questions  regarding the procedure were encouraged and answered. The patient understands and consents to the procedure. As antibiotic prophylaxis, cefazolin was ordered pre-procedure and administered intravenously within one hour of incision.Patency of the right IJ vein was confirmed with ultrasound with image documentation. An appropriate skin site was determined. Region was prepped using maximum barrier technique including cap and mask, sterile gown, sterile gloves, large sterile sheet, and Chlorhexidine as cutaneous antisepsis. The region was infiltrated locally with 1% lidocaine.  Intravenous Fentanyl and Versed were administered as conscious sedation during continuous cardiorespiratory monitoring by the radiology RN, with a total moderate sedation time of 6 minutes.  Under real-time ultrasound guidance, the right IJ vein was accessed with a 21 gauge micropuncture needle; the needle tip within the vein was confirmed with ultrasound image documentation. Needle exchanged over the 018 guidewire for transitional dilator, which allowed advancement of a Benson wire into the IVC. Over this, an MPA catheter was advanced. A Hemosplit 19 hemodialysis catheter was tunneled from the right anterior chest wall approach to the right IJ dermatotomy site. The MPA catheter was exchanged over an Amplatz wire for serial vascular dilators which allow placement of a peel-away sheath, through which the catheter was advanced under intermittent fluoroscopy, positioned with its tips in the proximal and midright atrium. Spot chest radiograph confirms good catheter position. No pneumothorax. Catheter was flushed and primed per protocol. Catheter secured externally with O Prolene sutures. The right IJ dermatotomy site was closed with Dermabond.  COMPLICATIONS: COMPLICATIONS None immediate  FLUOROSCOPY TIME:  30 seconds, 22 mGy  COMPARISON:  None  IMPRESSION: 1. Technically successful placement of tunneled right IJ hemodialysis catheter with ultrasound  and fluoroscopic guidance. Ready for routine use.  ACCESS: Remains approachable for percutaneous intervention as needed.   Electronically Signed   By: Lucrezia Europe M.D.   On: 10/21/2014 11:15   Dg Chest Port 1 View  10/15/2014   CLINICAL DATA:  CHF  EXAM: PORTABLE CHEST - 1 VIEW  COMPARISON:  10/12/2014  FINDINGS: Cardiomegaly with mild perihilar edema, improved. Left lung base is obscured, likely a combination of atelectasis and small pleural effusion.  IMPRESSION: Cardiomegaly with mild perihilar edema, improved.  Suspected small left pleural effusion.   Electronically Signed   By: Julian Hy M.D.   On: 10/15/2014 08:53   Dg Chest Portable 1 View  10/12/2014   CLINICAL DATA:  Respiratory distress  EXAM: PORTABLE CHEST - 1 VIEW  COMPARISON:  None.  FINDINGS: Central and basilar airspace opacities are present bilaterally. There is cardiomegaly. There probably is a small left pleural effusion.  IMPRESSION: Extensive airspace opacities and central and basilar distribution. This may represent alveolar edema associated with congestive heart failure. Probable left effusion. Cannot exclude infectious infiltrates.   Electronically Signed   By: Andreas Newport M.D.   On: 10/12/2014 01:51   Dg Hip Unilat With Pelvis 2-3 Views Left  10/13/2014   CLINICAL DATA:  Left hip pain.  EXAM: LEFT HIP (WITH PELVIS) 2-3 VIEWS  COMPARISON:  None.  FINDINGS: The left hip is located. No acute bone or soft tissue abnormality is present. Degenerative changes are noted in the SI joints bilaterally and lower lumbar spine.  IMPRESSION: 1. Normal radiographic appearance of the left hip. 2. Degenerative changes within the lower lumbar spine and SI joints.   Electronically Signed   By: San Morelle M.D.   On: 10/13/2014 09:48    CBC  Recent Labs Lab 10/20/14 0545 10/21/14 0340 10/21/14 2315 10/22/14 1530 10/23/14 0844  WBC 9.1 8.6 9.1 10.0 11.6*  HGB 7.8* 7.5* 7.8* 7.9* 7.4*  HCT 25.6* 25.1* 25.2* 26.0* 24.5*   PLT 199 195 225 216 199  MCV 84.2 84.5 85.1 84.4 85.7  MCH 25.7* 25.3* 26.4 25.6* 25.9*  MCHC 30.5 29.9* 31.0 30.4 30.2  RDW 14.7 14.8 15.0 15.1 15.1  LYMPHSABS  --   --  1.2  --   --   MONOABS  --   --  1.0  --   --   EOSABS  --   --  0.1  --   --   BASOSABS  --   --  0.0  --   --     Chemistries   Recent Labs Lab 10/20/14 1426 10/21/14 0500 10/21/14 2315 10/22/14 1530 10/23/14 0844  NA 134* 136 138 138 134*  K 5.1 4.9 4.5 3.9 4.2  CL 97* 97* 99* 97* 97*  CO2 28 28 30 30 30   GLUCOSE 200* 143* 141* 114* 212*  BUN 95* 96* 96* 57* 33*  CREATININE 4.96* 5.27* 4.93* 3.64* 3.35*  CALCIUM 6.9* 7.1* 7.1* 7.7* 7.6*   ------------------------------------------------------------------------------------------------------------------ estimated creatinine clearance is 21.8 mL/min (by C-G formula based on Cr of 3.35). ------------------------------------------------------------------------------------------------------------------ No results for input(s): HGBA1C in the last 72 hours. ------------------------------------------------------------------------------------------------------------------ No results for input(s): CHOL, HDL, LDLCALC, TRIG, CHOLHDL, LDLDIRECT in the last 72 hours. ------------------------------------------------------------------------------------------------------------------ No results for input(s): TSH, T4TOTAL, T3FREE, THYROIDAB in the last 72 hours.  Invalid input(s): FREET3 ------------------------------------------------------------------------------------------------------------------ No results for input(s): VITAMINB12, FOLATE, FERRITIN, TIBC, IRON, RETICCTPCT in the last 72 hours.  Coagulation profile  Recent Labs Lab 10/21/14 0340  INR 1.05    No results for input(s): DDIMER in the last 72 hours.  Cardiac Enzymes No results for input(s): CKMB, TROPONINI, MYOGLOBIN in the last 168 hours.  Invalid input(s):  CK ------------------------------------------------------------------------------------------------------------------ Invalid input(s): [name]  10/22/14 1111 10/22/14 1734 10/22/14 2215 10/23/14 0618 10/23/14 1305 10/23/14 1607  GLUCAP 149* 87 140* 129* 137* 225*    Verneita Griffes, MD [facility] [phone]

## 2014-10-23 NOTE — Progress Notes (Signed)
Patient ID: Jordan [name], female   DOB: 01-03-1941, 74 y.o.   MRN: [id number]  [facility] Progress Note    Assessment/ Plan:   1. ESRD after significant acute renal failure on chronic kidney disease stage IV: Etiology of acute renal failure was cardiorenal syndrome with additional insult from contrast-induced nephropathy. Ongoing hemodialysis-treatment #3 today with ongoing ultrafiltration. Plans for possible right brachiocephalic fistula/conversion to tunneled hemodialysis catheter on Tuesday. Will order for hemodialysis again on Monday to try and optimize volume status/electrolytes for surgery. Clinically, pedal edema appears to be improving 2. Non-ST elevation myocardial infarction: on optimized medical management and now status post coronary angiogram-may need repeat study now that she has ESRD and contrast load not restricted.  3. Hypertension: Blood pressure is intermittently elevated-anticipate will improve with ultrafiltration on hemodialysis. Volume overload/anasarca: Discontinue furosemide, continue ultrafiltration with hemodialysis 5. Anemia: Iron stores appear adequate, given aranesp for management of low hemoglobin  Subjective:   Reports that a good night and continues to tolerate hemodialysis without any problems    Objective:   BP 140/61 mmHg  Pulse 72  Temp(Src) 98.6 F (37 C) (Oral)  Resp 17  Ht 5\' 5"  (1.651 m)  Wt 140.298 kg (309 lb 4.8 oz)  BMI 51.47 kg/m2  SpO2 94%  Intake/Output Summary (Last 24 hours) at 10/23/14 0842 Last data filed at 10/23/14 [id number]  Gross per 24 hour  Intake    840 ml  Output   3345 ml  Net  -2505 ml   Weight change: -2.7 kg (-5 lb 15.2 oz)  Physical Exam: Gen: On her way to dialysis CVS: Regular rate and rhythm, S1 and S2 normal Resp: Clear to auscultation-no distinct rales or rhonchi Abd: Soft, obese, nontender Ext: 1-2+ lower extremity edema  Imaging: Ir Fluoro Guide Cv Line Right  10/21/2014   CLINICAL  DATA:  Chronic renal insufficiency, needs access for hemodialysis  EXAM: TUNNELED HEMODIALYSIS CATHETER PLACEMENT WITH ULTRASOUND AND FLUOROSCOPIC GUIDANCE  TECHNIQUE: The procedure, risks, benefits, and alternatives were explained to the patient. Questions regarding the procedure were encouraged and answered. The patient understands and consents to the procedure. As antibiotic prophylaxis, cefazolin was ordered pre-procedure and administered intravenously within one hour of incision.Patency of the right IJ vein was confirmed with ultrasound with image documentation. An appropriate skin site was determined. Region was prepped using maximum barrier technique including cap and mask, sterile gown, sterile gloves, large sterile sheet, and Chlorhexidine as cutaneous antisepsis. The region was infiltrated locally with 1% lidocaine.  Intravenous Fentanyl and Versed were administered as conscious sedation during continuous cardiorespiratory monitoring by the radiology RN, with a total moderate sedation time of 6 minutes.  Under real-time ultrasound guidance, the right IJ vein was accessed with a 21 gauge micropuncture needle; the needle tip within the vein was confirmed with ultrasound image documentation. Needle exchanged over the 018 guidewire for transitional dilator, which allowed advancement of a Benson wire into the IVC. Over this, an MPA catheter was advanced. A Hemosplit 19 hemodialysis catheter was tunneled from the right anterior chest wall approach to the right IJ dermatotomy site. The MPA catheter was exchanged over an Amplatz wire for serial vascular dilators which allow placement of a peel-away sheath, through which the catheter was advanced under intermittent fluoroscopy, positioned with its tips in the proximal and midright atrium. Spot chest radiograph confirms good catheter position. No pneumothorax. Catheter was flushed and primed per protocol. Catheter secured externally with O Prolene sutures. The right  IJ dermatotomy site  was closed with Dermabond.  COMPLICATIONS: COMPLICATIONS None immediate  FLUOROSCOPY TIME:  30 seconds, 22 mGy  COMPARISON:  None  IMPRESSION: 1. Technically successful placement of tunneled right IJ hemodialysis catheter with ultrasound and fluoroscopic guidance. Ready for routine use.  ACCESS: Remains approachable for percutaneous intervention as needed.   Electronically Signed   By: Lucrezia Europe M.D.   On: 10/21/2014 11:15   Ir US Guide Vasc Access Right  10/21/2014   CLINICAL DATA:  Chronic renal insufficiency, needs access for hemodialysis  EXAM: TUNNELED HEMODIALYSIS CATHETER PLACEMENT WITH ULTRASOUND AND FLUOROSCOPIC GUIDANCE  TECHNIQUE: The procedure, risks, benefits, and alternatives were explained to the patient. Questions regarding the procedure were encouraged and answered. The patient understands and consents to the procedure. As antibiotic prophylaxis, cefazolin was ordered pre-procedure and administered intravenously within one hour of incision.Patency of the right IJ vein was confirmed with ultrasound with image documentation. An appropriate skin site was determined. Region was prepped using maximum barrier technique including cap and mask, sterile gown, sterile gloves, large sterile sheet, and Chlorhexidine as cutaneous antisepsis. The region was infiltrated locally with 1% lidocaine.  Intravenous Fentanyl and Versed were administered as conscious sedation during continuous cardiorespiratory monitoring by the radiology RN, with a total moderate sedation time of 6 minutes.  Under real-time ultrasound guidance, the right IJ vein was accessed with a 21 gauge micropuncture needle; the needle tip within the vein was confirmed with ultrasound image documentation. Needle exchanged over the 018 guidewire for transitional dilator, which allowed advancement of a Benson wire into the IVC. Over this, an MPA catheter was advanced. A Hemosplit 19 hemodialysis catheter was tunneled from the  right anterior chest wall approach to the right IJ dermatotomy site. The MPA catheter was exchanged over an Amplatz wire for serial vascular dilators which allow placement of a peel-away sheath, through which the catheter was advanced under intermittent fluoroscopy, positioned with its tips in the proximal and midright atrium. Spot chest radiograph confirms good catheter position. No pneumothorax. Catheter was flushed and primed per protocol. Catheter secured externally with O Prolene sutures. The right IJ dermatotomy site was closed with Dermabond.  COMPLICATIONS: COMPLICATIONS None immediate  FLUOROSCOPY TIME:  30 seconds, 22 mGy  COMPARISON:  None  IMPRESSION: 1. Technically successful placement of tunneled right IJ hemodialysis catheter with ultrasound and fluoroscopic guidance. Ready for routine use.  ACCESS: Remains approachable for percutaneous intervention as needed.   Electronically Signed   By: Lucrezia Europe M.D.   On: 10/21/2014 11:15    Labs: BMET  Recent Labs Lab 10/18/14 0236 10/19/14 0700 10/20/14 0545 10/20/14 1426 10/21/14 0500 10/21/14 2315 10/22/14 1530  NA 133* 134* 134* 134* 136 138 138  K 4.8 5.1 5.9* 5.1 4.9 4.5 3.9  CL 94* 97* 98* 97* 97* 99* 97*  CO2 27 26 27 28 28 30 30   GLUCOSE 154* 184* 202* 200* 143* 141* 114*  BUN 80* 90* 92* 95* 96* 96* 57*  CREATININE 4.39* 4.49* 4.79* 4.96* 5.27* 4.93* 3.64*  CALCIUM 6.9* 6.8* 7.0* 6.9* 7.1* 7.1* 7.7*  PHOS  --   --   --   --  6.5* 5.9* 4.3   CBC  Recent Labs Lab 10/20/14 0545 10/21/14 0340 10/21/14 2315 10/22/14 1530  WBC 9.1 8.6 9.1 10.0  NEUTROABS  --   --  6.8  --   HGB 7.8* 7.5* 7.8* 7.9*  HCT 25.6* 25.1* 25.2* 26.0*  MCV 84.2 84.5 85.1 84.4  PLT 199 195 225 216  Medications:    . antiseptic oral rinse  7 mL Mouth Rinse BID  . aspirin EC  81 mg Oral Daily  . atorvastatin  40 mg Oral q1800  . carvedilol  25 mg Oral BID WC  . darbepoetin (ARANESP) injection - NON-DIALYSIS  60 mcg Subcutaneous Q  Wed-1800  . docusate sodium  100 mg Oral BID  . gabapentin  300 mg Oral QHS  . heparin  5,000 Units Subcutaneous 3 times per day  . hydrALAZINE  100 mg Oral 3 times per day  . insulin aspart  0-15 Units Subcutaneous TID WC  . insulin aspart  0-5 Units Subcutaneous QHS  . insulin aspart  10 Units Subcutaneous TID WC  . insulin detemir  60 Units Subcutaneous Daily  . isosorbide mononitrate  120 mg Oral Daily  . lidocaine  1 patch Transdermal Q24H  . multivitamin with minerals  1 tablet Oral Daily  . pantoprazole  40 mg Oral Daily  . sodium chloride  3 mL Intravenous Q12H  . sodium chloride  3 mL Intravenous Q12H  . sodium chloride  3 mL Intravenous Q12H   Elmarie Shiley, MD 10/23/2014, 8:42 AM

## 2014-10-23 NOTE — Procedures (Signed)
Patient seen on Hemodialysis. QB 400, UF goal 4L Treatment adjusted as needed.  Elmarie Shiley MD [facility]. Office # [phone] Pager # [phone] 10:24 AM

## 2014-10-24 LAB — GLUCOSE, CAPILLARY
GLUCOSE-CAPILLARY: 132 mg/dL — AB (ref 65–99)
Glucose-Capillary: 159 mg/dL — ABNORMAL HIGH (ref 65–99)
Glucose-Capillary: 122 mg/dL — ABNORMAL HIGH (ref 65–99)
Glucose-Capillary: 147 mg/dL — ABNORMAL HIGH (ref 65–99)

## 2014-10-24 NOTE — Progress Notes (Signed)
Patient ID: Jordan [name], female   DOB: [birth year], 74 y.o.   MRN: [id number]   [facility] Progress Note    Assessment/ Plan:   1. ESRD after significant acute renal failure on chronic kidney disease stage IV: Etiology of acute renal failure was cardiorenal syndrome with additional insult from contrast-induced nephropathy. She is status post 3 dialysis treatments and I will schedule her for next dialysis treatment tomorrow-volume status significantly better. Plans for possible right brachiocephalic fistula/conversion to tunneled hemodialysis catheter on Tuesday. 2. Non-ST elevation myocardial infarction: on optimized medical management and now status post coronary angiogram- lens noted for possible repeat study now that she has end-stage renal disease.  3. Hypertension: Blood pressure control appears to be good with ongoing medications/ultrafiltration.  4. 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  Subjective:   Reports improvement in her shortness of breath and significant improvement in overall edema since she started dialysis    Objective:   BP 121/47 mmHg  Pulse 72  Temp(Src) 98.2 F (36.8 C) (Oral)  Resp 18  Ht 5\' 5"  (1.651 m)  Wt 139.5 kg (307 lb 8.7 oz)  BMI 51.18 kg/m2  SpO2 100%  Intake/Output Summary (Last 24 hours) at 10/24/14 [id number] Last data filed at 10/24/14 [id number]  Gross per 24 hour  Intake   1080 ml  Output   3500 ml  Net  -2420 ml   Weight change: -1.3 kg (-2 lb 13.9 oz)  Physical Exam: Gen: Comfortably resting in bed CVS: Regular rate and rhythm, S1 and S2 normal Resp: Clear to auscultation-no distinct rales or rhonchi Abd: Soft, obese, nontender Ext: 1-2+ lower extremity edema (improving)  Imaging: No results found.  Labs: BMET  Recent Labs Lab 10/19/14 0700 10/20/14 0545 10/20/14 1426  10/21/14 0500 10/21/14 2315 10/22/14 1530 10/23/14 0844  NA 134* 134* 134* 136 138 138 134*  K 5.1 5.9* 5.1 4.9 4.5 3.9 4.2  CL 97* 98* 97* 97* 99* 97* 97*  CO2 26 27 28 28 30 30 30   GLUCOSE 184* 202* 200* 143* 141* 114* 212*  BUN 90* 92* 95* 96* 96* 57* 33*  CREATININE 4.49* 4.79* 4.96* 5.27* 4.93* 3.64* 3.35*  CALCIUM 6.8* 7.0* 6.9* 7.1* 7.1* 7.7* 7.6*  PHOS  --   --   --  6.5* 5.9* 4.3 3.8   CBC  Recent Labs Lab 10/21/14 0340 10/21/14 2315 10/22/14 1530 10/23/14 0844  WBC 8.6 9.1 10.0 11.6*  NEUTROABS  --  6.8  --   --   HGB 7.5* 7.8* 7.9* 7.4*  HCT 25.1* 25.2* 26.0* 24.5*  MCV 84.5 85.1 84.4 85.7  PLT 195 225 216 199    Medications:    . antiseptic oral rinse  7 mL Mouth Rinse BID  . aspirin EC  81 mg Oral Daily  . atorvastatin  40 mg Oral q1800  . carvedilol  25 mg Oral BID WC  . darbepoetin (ARANESP) injection - NON-DIALYSIS  60 mcg Subcutaneous Q Wed-1800  . docusate sodium  100 mg Oral BID  . gabapentin  300 mg Oral QHS  . heparin  5,000 Units Subcutaneous 3 times per day  . hydrALAZINE  100 mg Oral 3 times per day  . insulin aspart  0-15 Units Subcutaneous TID WC  . insulin aspart  0-5 Units Subcutaneous QHS  . insulin aspart  10 Units Subcutaneous TID WC  .  insulin detemir  60 Units Subcutaneous Daily  . isosorbide mononitrate  120 mg Oral Daily  . lidocaine  1 patch Transdermal Q24H  . multivitamin with minerals  1 tablet Oral Daily  . pantoprazole  40 mg Oral Daily  . sodium chloride  3 mL Intravenous Q12H  . sodium chloride  3 mL Intravenous Q12H  . sodium chloride  3 mL Intravenous Q12H   Elmarie Shiley, MD 10/24/2014, 9:18 AM

## 2014-10-24 NOTE — Progress Notes (Signed)
Brief HPI    74 y.o.? from [place] visiting family with PMH of hypertension, diabetes mellitus ty II, GERD, breast cancer s/p CHemo-XRT, COPD, arthritis, chronic kidney disease (unknown stage), chronic leg edema--she states this had been going on for 4-5 yrs OPTA [not worsened by chemo-XRT]  Admitted 10/12/14 c  shortness of breath and syncope. She had been doing fine until 11:30 on night of admission when she started having SOB. -cough and - chest pain.   Per her daughter, patient did some exertion, then she passed out for few seconds-LOC witnessed by daughter O2 sat 60% on arrival She did not have seizure. No head injury.   On Bumex for chronic leg edema, but denies congestive heart failure.  She states that her leg swelling has been worsening recently.   BNP of 143, initial troponin was negative, afebrile, slight bradycardia, elevated creatinine at 3.60 (baseline unknown) and BUN at 42. ABG showed pH 7.206, PCO2 64.1, PO2 252. Chest x-ray showed alvola edema. U/A negative. Renal ultrasound negative for hydronephrosis. . 2D echo doneand her 2nd and 3rd troponins both returned elevated at 0.51 and 0.58. EKG shows SR with LBBB (no prior EKGs to compare to).  Patient was found to have NSTEMI and had Cardiac cath 10/19/14 Venous mapping for perm access doen and had Temp Dialysis cath placed in RU chest  Getting dilaysis now and is to have permanent vascular access/fistula placed by Dr. Bridgett Larsson of vascular surgery 10/26/14     Assessment & Plan    Acute on chronic hypoxic respiratory failure likely due to NSTEMI and acute on chronic systolic CHF - Progressively improving  Acute on chronic systolic CHF exacerbation:  -LBBB of unknown duration, troponin elevation -not candidate for PPM -Echo from 5/16 in [place] shows 55-60%- no wall abnormalities, 2-D echo 6/28 showed EF of 30% with akinesis of inferior septum, akinesis of inferior wall, hypokinesis of anterior septum and akinesis of  anterior wall -Not a candidate for ACEI/ARB due to chronic kidney disease -Continue Coreg, nitrates, hydralazine -dialysis for volume: cardiology did d/c lasix on 7/8  Anemia-normocytic and probably of chronic disease iron/TIBC ratio above 18% -If below 7 would transfuse -Iron studies done 7/5 - Aranesp per Renal  NSTEMI - 2-D echo 6/28 showed EF of 30% with akinesis of inferior septum, akinesis of inferior wall, hypokinesis of anterior septum and akinesis of anterior wall -Cardiac cath 10/19/14  Mild nonobstructive CAD  Severe pulmonary HTN  Elevated LV filling presss.  Normal cardiac output  AKI on CKD (chronic kidney disease):  -baseline appears to be: 3 1/16, underlying stage IV CKD -US-renal- medical renal disease - Nephrology input appreciated, creatinine continues to ? and GFR ? - 1st hemodialysis 10/21/14---Access has been placed R upper Chest 10/21/14 and patientfor venous mapping -Transient hyperkalemia Rx with Kayexalate per Nephro -For Optimization per Dr. Posey Pronto with dialysis and then maybe Vascular access this coming Tuesday -nutritionist to see prior to d/c to teach about Renal diet  COPD:  - Does not seem to have acute exacerbation- wheezing was most likely fluid  DM-II- Hemoglobin A1c: 9.4  Insulin-dependent, uncontrolled - continue Levemir 60 units, meal coverage to 10 units 3 times a day, sliding scale insulin  -sugars 110-140'as  Hx of Breast cancer:  -s/p of L mastectomy, chemotherapy and radiation therapy.oncologist -Last seen was 2 months ago, was told that her breast cancer was stable. -Has restriction to LUE 2/2 [place]orange -Follow-up with her oncologist in  [place].  Hypertension: Improving today -Continue Coreg 25 bid,  -nitrates imdur 120 daily - hydralazine 100 q 8  GERD: -Continue PPI  -some implication on varied studies of association wit PNA/CKD  OSA? -de-sat 84% off oxygen when asleep -been told she needs Bi-pap but has never  used -desat screen ordered  Left Hip pain has history of osteoarthritis : Feeling better today - hip x ray does not show fracture - CT of the left hip showed moderate osteoarthritis with very small joint effusion, no acute or subacute abnormality - Place on pain control, increase Neurontin, added oxycodone    Super morbid Obesity Body mass index is 51.18 kg/(m^2). -encourage weight loss, diet control  Code Status: full code  Family Communication: Discussed in detail with the patient, all imaging results, lab results explained to the patient    Disposition Plan: tx to tele on 10/21/14  Time Spent in minutes   25 minutes  Procedures  2d echo Left hip xray Renal US CT of the left hip Cardiac CATH  Consults   Cardiology  Nephrology   DVT Prophylaxis  heparin drip   Medications  Scheduled Meds: . antiseptic oral rinse  7 mL Mouth Rinse BID  . aspirin EC  81 mg Oral Daily  . atorvastatin  40 mg Oral q1800  . carvedilol  25 mg Oral BID WC  . darbepoetin (ARANESP) injection - NON-DIALYSIS  60 mcg Subcutaneous Q Wed-1800  . docusate sodium  100 mg Oral BID  . gabapentin  300 mg Oral QHS  . heparin  5,000 Units Subcutaneous 3 times per day  . hydrALAZINE  100 mg Oral 3 times per day  . insulin aspart  0-15 Units Subcutaneous TID WC  . insulin aspart  0-5 Units Subcutaneous QHS  . insulin aspart  10 Units Subcutaneous TID WC  . insulin detemir  60 Units Subcutaneous Daily  . isosorbide mononitrate  120 mg Oral Daily  . lidocaine  1 patch Transdermal Q24H  . multivitamin with minerals  1 tablet Oral Daily  . pantoprazole  40 mg Oral Daily  . sodium chloride  3 mL Intravenous Q12H  . sodium chloride  3 mL Intravenous Q12H  . sodium chloride  3 mL Intravenous Q12H   Continuous Infusions:   PRN Meds:.sodium chloride, sodium chloride, sodium chloride, acetaminophen, albuterol, capsaicin, guaiFENesin-codeine, ipratropium-albuterol, ondansetron (ZOFRAN) IV, oxyCODONE,  polyethylene glycol, sodium chloride, sodium chloride, sodium chloride   Antibiotics   Anti-infectives    Start     Dose/Rate Route Frequency Ordered Stop   10/21/14 1044  ceFAZolin (ANCEF) 2-3 GM-% IVPB SOLR    CommentsKarlton Lemon, [place]   : cabinet override      10/21/14 1044 10/21/14 2259   10/21/14 0830  ceFAZolin (ANCEF) powder 2 g    Comments:  Give in  IR   2 g Other To Surgery 10/21/14 0829 10/21/14 1050        Subjective:   Felt significant cramping in her lower extremities after dialysis Did not tell nephrologist these concerns this morning Tolerating diet fairly well Desats on sleeping No chest pain no nausea no vomiting no shortness of breath   Objective:   Blood pressure 103/35, pulse 75, temperature 98.2 F (36.8 C), temperature source Oral, resp. rate 20, height 5\' 5"  (1.651 m), weight 139.5 kg (307 lb 8.7 oz), SpO2 100 %.  Wt Readings from Last 3 Encounters:  10/24/14 139.5 kg (307 lb 8.7 oz)     Intake/Output Summary (  Last 24 hours) at 10/24/14 1545 Last data filed at 10/24/14 1451  Gross per 24 hour  Intake   1080 ml  Output      0 ml  Net   1080 ml    Exam  General: Alert and oriented x 3, NAD. Thick neck  HEENT:  PERRLA, EOMI,  Neck: Supple,    CVS: S1 S2clear, no mrg  Respiratory: Clear to auscultation bilaterally  Abdomen:  obese, soft, NT, ND, NBS  Ext: no cyanosis clubbing, 1+ edema b/l LE   Neuro: grossly wnl  Data Review   Micro Results No results found for this or any previous visit (from the past 240 hour(s)).  Radiology Reports US Renal  10/12/2014   CLINICAL DATA:  Acute renal insufficiency  EXAM: RENAL / URINARY TRACT ULTRASOUND COMPLETE  COMPARISON:  None.  FINDINGS: Right Kidney:  Length: 11.7 cm.  Mildly increased echogenicity.  No hydronephrosis.  Left Kidney:  Length: 12.1 cm.  Mildly increased echogenicity.  No hydronephrosis.  Bladder:  Appears normal for degree of bladder distention.  IMPRESSION: Negative for  hydronephrosis. There is mildly increased renal parenchymal echogenicity consistent with medical renal disease.   Electronically Signed   By: Andreas Newport M.D.   On: 10/12/2014 06:29   Ct Hip Left Wo Contrast  10/16/2014   CLINICAL DATA:  Patient presented 10/12/2014 with shortness of breath and syncope and was found to have a non ST elevation MI. Prior history of left breast cancer post mastectomy. Acute superimposed upon chronic left hip pain. No known injuries.  EXAM: CT OF THE LEFT HIP WITHOUT CONTRAST  TECHNIQUE: Multidetector CT imaging of the left hip was performed according to the standard protocol. Multiplanar CT image reconstructions were also generated.  COMPARISON:  Left hip x-rays 10/13/2014.  FINDINGS: No evidence of acute, subacute or healed fractures. Severe narrowing of the joint space posteriorly with associated hypertrophic spurring involving the posterior column of the acetabulum. Moderate joint space narrowing elsewhere. Very small joint effusion. No evidence of osseous metastatic disease involving the visualized bones comprising the left hip.  IMPRESSION: 1. No acute or subacute osseous abnormality. 2. Moderate osteoarthritis. 3. Very small joint effusion. 4. No evidence of osseous metastatic disease.   Electronically Signed   By: Evangeline Dakin M.D.   On: 10/16/2014 14:15   Ir Fluoro Guide Cv Line Right  10/21/2014   CLINICAL DATA:  Chronic renal insufficiency, needs access for hemodialysis  EXAM: TUNNELED HEMODIALYSIS CATHETER PLACEMENT WITH ULTRASOUND AND FLUOROSCOPIC GUIDANCE  TECHNIQUE: The procedure, risks, benefits, and alternatives were explained to the patient. Questions regarding the procedure were encouraged and answered. The patient understands and consents to the procedure. As antibiotic prophylaxis, cefazolin was ordered pre-procedure and administered intravenously within one hour of incision.Patency of the right IJ vein was confirmed with ultrasound with image  documentation. An appropriate skin site was determined. Region was prepped using maximum barrier technique including cap and mask, sterile gown, sterile gloves, large sterile sheet, and Chlorhexidine as cutaneous antisepsis. The region was infiltrated locally with 1% lidocaine.  Intravenous Fentanyl and Versed were administered as conscious sedation during continuous cardiorespiratory monitoring by the radiology RN, with a total moderate sedation time of 6 minutes.  Under real-time ultrasound guidance, the right IJ vein was accessed with a 21 gauge micropuncture needle; the needle tip within the vein was confirmed with ultrasound image documentation. Needle exchanged over the 018 guidewire for transitional dilator, which allowed advancement of a Benson wire into the IVC.  Over this, an MPA catheter was advanced. A Hemosplit 19 hemodialysis catheter was tunneled from the right anterior chest wall approach to the right IJ dermatotomy site. The MPA catheter was exchanged over an Amplatz wire for serial vascular dilators which allow placement of a peel-away sheath, through which the catheter was advanced under intermittent fluoroscopy, positioned with its tips in the proximal and midright atrium. Spot chest radiograph confirms good catheter position. No pneumothorax. Catheter was flushed and primed per protocol. Catheter secured externally with O Prolene sutures. The right IJ dermatotomy site was closed with Dermabond.  COMPLICATIONS: COMPLICATIONS None immediate  FLUOROSCOPY TIME:  30 seconds, 22 mGy  COMPARISON:  None  IMPRESSION: 1. Technically successful placement of tunneled right IJ hemodialysis catheter with ultrasound and fluoroscopic guidance. Ready for routine use.  ACCESS: Remains approachable for percutaneous intervention as needed.   Electronically Signed   By: Lucrezia Europe M.D.   On: 10/21/2014 11:15   Ir US Guide Vasc Access Right  10/21/2014   CLINICAL DATA:  Chronic renal insufficiency, needs access for  hemodialysis  EXAM: TUNNELED HEMODIALYSIS CATHETER PLACEMENT WITH ULTRASOUND AND FLUOROSCOPIC GUIDANCE  TECHNIQUE: The procedure, risks, benefits, and alternatives were explained to the patient. Questions regarding the procedure were encouraged and answered. The patient understands and consents to the procedure. As antibiotic prophylaxis, cefazolin was ordered pre-procedure and administered intravenously within one hour of incision.Patency of the right IJ vein was confirmed with ultrasound with image documentation. An appropriate skin site was determined. Region was prepped using maximum barrier technique including cap and mask, sterile gown, sterile gloves, large sterile sheet, and Chlorhexidine as cutaneous antisepsis. The region was infiltrated locally with 1% lidocaine.  Intravenous Fentanyl and Versed were administered as conscious sedation during continuous cardiorespiratory monitoring by the radiology RN, with a total moderate sedation time of 6 minutes.  Under real-time ultrasound guidance, the right IJ vein was accessed with a 21 gauge micropuncture needle; the needle tip within the vein was confirmed with ultrasound image documentation. Needle exchanged over the 018 guidewire for transitional dilator, which allowed advancement of a Benson wire into the IVC. Over this, an MPA catheter was advanced. A Hemosplit 19 hemodialysis catheter was tunneled from the right anterior chest wall approach to the right IJ dermatotomy site. The MPA catheter was exchanged over an Amplatz wire for serial vascular dilators which allow placement of a peel-away sheath, through which the catheter was advanced under intermittent fluoroscopy, positioned with its tips in the proximal and midright atrium. Spot chest radiograph confirms good catheter position. No pneumothorax. Catheter was flushed and primed per protocol. Catheter secured externally with O Prolene sutures. The right IJ dermatotomy site was closed with Dermabond.   COMPLICATIONS: COMPLICATIONS None immediate  FLUOROSCOPY TIME:  30 seconds, 22 mGy  COMPARISON:  None  IMPRESSION: 1. Technically successful placement of tunneled right IJ hemodialysis catheter with ultrasound and fluoroscopic guidance. Ready for routine use.  ACCESS: Remains approachable for percutaneous intervention as needed.   Electronically Signed   By: Lucrezia Europe M.D.   On: 10/21/2014 11:15   Dg Chest Port 1 View  10/15/2014   CLINICAL DATA:  CHF  EXAM: PORTABLE CHEST - 1 VIEW  COMPARISON:  10/12/2014  FINDINGS: Cardiomegaly with mild perihilar edema, improved. Left lung base is obscured, likely a combination of atelectasis and small pleural effusion.  IMPRESSION: Cardiomegaly with mild perihilar edema, improved.  Suspected small left pleural effusion.   Electronically Signed   By: Henderson Newcomer.D.  On: 10/15/2014 08:53   Dg Chest Portable 1 View  10/12/2014   CLINICAL DATA:  Respiratory distress  EXAM: PORTABLE CHEST - 1 VIEW  COMPARISON:  None.  FINDINGS: Central and basilar airspace opacities are present bilaterally. There is cardiomegaly. There probably is a small left pleural effusion.  IMPRESSION: Extensive airspace opacities and central and basilar distribution. This may represent alveolar edema associated with congestive heart failure. Probable left effusion. Cannot exclude infectious infiltrates.   Electronically Signed   By: Andreas Newport M.D.   On: 10/12/2014 01:51   Dg Hip Unilat With Pelvis 2-3 Views Left  10/13/2014   CLINICAL DATA:  Left hip pain.  EXAM: LEFT HIP (WITH PELVIS) 2-3 VIEWS  COMPARISON:  None.  FINDINGS: The left hip is located. No acute bone or soft tissue abnormality is present. Degenerative changes are noted in the SI joints bilaterally and lower lumbar spine.  IMPRESSION: 1. Normal radiographic appearance of the left hip. 2. Degenerative changes within the lower lumbar spine and SI joints.   Electronically Signed   By: San Morelle M.D.   On:  10/13/2014 09:48    CBC  Recent Labs Lab 10/20/14 0545 10/21/14 0340 10/21/14 2315 10/22/14 1530 10/23/14 0844  WBC 9.1 8.6 9.1 10.0 11.6*  HGB 7.8* 7.5* 7.8* 7.9* 7.4*  HCT 25.6* 25.1* 25.2* 26.0* 24.5*  PLT 199 195 225 216 199  MCV 84.2 84.5 85.1 84.4 85.7  MCH 25.7* 25.3* 26.4 25.6* 25.9*  MCHC 30.5 29.9* 31.0 30.4 30.2  RDW 14.7 14.8 15.0 15.1 15.1  LYMPHSABS  --   --  1.2  --   --   MONOABS  --   --  1.0  --   --   EOSABS  --   --  0.1  --   --   BASOSABS  --   --  0.0  --   --     Chemistries   Recent Labs Lab 10/20/14 1426 10/21/14 0500 10/21/14 2315 10/22/14 1530 10/23/14 0844  NA 134* 136 138 138 134*  K 5.1 4.9 4.5 3.9 4.2  CL 97* 97* 99* 97* 97*  CO2 28 28 30 30 30   GLUCOSE 200* 143* 141* 114* 212*  BUN 95* 96* 96* 57* 33*  CREATININE 4.96* 5.27* 4.93* 3.64* 3.35*  CALCIUM 6.9* 7.1* 7.1* 7.7* 7.6*   ------------------------------------------------------------------------------------------------------------------ estimated creatinine clearance is 21.9 mL/min (by C-G formula based on Cr of 3.35). ------------------------------------------------------------------------------------------------------------------ No results for input(s): HGBA1C in the last 72 hours. ------------------------------------------------------------------------------------------------------------------ No results for input(s): CHOL, HDL, LDLCALC, TRIG, CHOLHDL, LDLDIRECT in the last 72 hours. ------------------------------------------------------------------------------------------------------------------ No results for input(s): TSH, T4TOTAL, T3FREE, THYROIDAB in the last 72 hours.  Invalid input(s): FREET3 ------------------------------------------------------------------------------------------------------------------ No results for input(s): VITAMINB12, FOLATE, FERRITIN, TIBC, IRON, RETICCTPCT in the last 72 hours.  Coagulation profile  Recent Labs Lab 10/21/14 0340    INR 1.05    No results for input(s): DDIMER in the last 72 hours.  Cardiac Enzymes No results for input(s): CKMB, TROPONINI, MYOGLOBIN in the last 168 hours.  Invalid input(s): CK ------------------------------------------------------------------------------------------------------------------ Invalid input(s): Tennyson   Recent Labs  10/23/14 0618 10/23/14 1305 10/23/14 1607 10/23/14 2117 10/24/14 0637 10/24/14 1123  GLUCAP 129* 137* 225* 110* 159* 122*    Verneita Griffes, MD [facility] [phone]

## 2014-10-24 NOTE — Progress Notes (Signed)
   Reviewed recent notes Fluid mgt by HD No further cardiac recs at this time.   Candee Furbish, MD

## 2014-10-25 LAB — CBC WITH DIFFERENTIAL/PLATELET
Basophils Absolute: 0 10*3/uL (ref 0.0–0.1)
Basophils Relative: 0 % (ref 0–1)
Eosinophils Absolute: 0.2 10*3/uL (ref 0.0–0.7)
Eosinophils Relative: 2 % (ref 0–5)
HCT: 24.1 % — ABNORMAL LOW (ref 36.0–46.0)
Hemoglobin: 7.2 g/dL — ABNORMAL LOW (ref 12.0–15.0)
LYMPHS ABS: 1.2 10*3/uL (ref 0.7–4.0)
Lymphocytes Relative: 10 % — ABNORMAL LOW (ref 12–46)
MCH: 25.7 pg — AB (ref 26.0–34.0)
MCHC: 29.9 g/dL — AB (ref 30.0–36.0)
MCV: 86.1 fL (ref 78.0–100.0)
MONO ABS: 0.9 10*3/uL (ref 0.1–1.0)
MONOS PCT: 7 % (ref 3–12)
Neutro Abs: 9.8 10*3/uL — ABNORMAL HIGH (ref 1.7–7.7)
Neutrophils Relative %: 81 % — ABNORMAL HIGH (ref 43–77)
Platelets: 205 10*3/uL (ref 150–400)
RBC: 2.8 MIL/uL — ABNORMAL LOW (ref 3.87–5.11)
RDW: 15.2 % (ref 11.5–15.5)
WBC: 12.1 10*3/uL — ABNORMAL HIGH (ref 4.0–10.5)

## 2014-10-25 LAB — RENAL FUNCTION PANEL
Albumin: 2.7 g/dL — ABNORMAL LOW (ref 3.5–5.0)
Anion gap: 8 (ref 5–15)
BUN: 37 mg/dL — AB (ref 6–20)
CALCIUM: 8 mg/dL — AB (ref 8.9–10.3)
CO2: 28 mmol/L (ref 22–32)
Chloride: 98 mmol/L — ABNORMAL LOW (ref 101–111)
Creatinine, Ser: 5.53 mg/dL — ABNORMAL HIGH (ref 0.44–1.00)
GFR, EST AFRICAN AMERICAN: 8 mL/min — AB (ref >60)
GFR, EST NON AFRICAN AMERICAN: 7 mL/min — AB (ref >60)
GLUCOSE: 134 mg/dL — AB (ref 65–99)
POTASSIUM: 4.2 mmol/L (ref 3.5–5.1)
Phosphorus: 4.7 mg/dL — ABNORMAL HIGH (ref 2.5–4.6)
SODIUM: 134 mmol/L — AB (ref 135–145)

## 2014-10-25 LAB — GLUCOSE, CAPILLARY
GLUCOSE-CAPILLARY: 198 mg/dL — AB (ref 65–99)
GLUCOSE-CAPILLARY: 150 mg/dL — AB (ref 65–99)
Glucose-Capillary: 105 mg/dL — ABNORMAL HIGH (ref 65–99)
Glucose-Capillary: 131 mg/dL — ABNORMAL HIGH (ref 65–99)

## 2014-10-25 MED ORDER — ISOSORBIDE MONONITRATE ER 60 MG PO TB24
60.0000 mg | ORAL_TABLET | Freq: Every day | ORAL | Status: DC
  Filled 2014-10-25 (×2): qty 1

## 2014-10-25 MED ORDER — CEFUROXIME SODIUM 1.5 G IJ SOLR
1.5000 g | INTRAMUSCULAR | Status: AC
Start: 2014-10-26 — End: 2014-10-26
  Administered 2014-10-26: 1.5 g via INTRAVENOUS
  Filled 2014-10-25 (×2): qty 1.5

## 2014-10-25 MED ORDER — CARVEDILOL 6.25 MG PO TABS
6.2500 mg | ORAL_TABLET | Freq: Two times a day (BID) | ORAL | Status: DC
  Administered 2014-10-25 – 2014-10-27 (×4): 6.25 mg via ORAL
  Filled 2014-10-25 (×6): qty 1

## 2014-10-25 MED ORDER — DARBEPOETIN ALFA 100 MCG/0.5ML IJ SOSY
100.0000 ug | PREFILLED_SYRINGE | INTRAMUSCULAR | Status: DC
  Filled 2014-10-25 (×2): qty 0.5

## 2014-10-25 MED ORDER — HYDRALAZINE HCL 25 MG PO TABS
25.0000 mg | ORAL_TABLET | Freq: Three times a day (TID) | ORAL | Status: DC
  Administered 2014-10-25 – 2014-10-26 (×4): 25 mg via ORAL
  Filled 2014-10-25 (×9): qty 1

## 2014-10-25 MED ORDER — HEPARIN SODIUM (PORCINE) 1000 UNIT/ML DIALYSIS: 5000.0000 [IU] | INTRAMUSCULAR | Status: DC | PRN

## 2014-10-25 NOTE — Progress Notes (Signed)
Pt with IV to right arm, order from 7/8 to remove IV and place in left arm, pt left arm is restricted as well d/t mastectomy in 2014. K Schorr paged. Stated to leave IV for now and clarify with MD in AM.

## 2014-10-25 NOTE — Progress Notes (Signed)
Doing good. No other problems. Tolerating diet No chest pain or shortness of breath Had 4 L of dialysis performed today and tolerated it better than prior No  nausea or vomiting Swelling in lower extremities is better  Brief HPI    74 y.o.? from [place] visiting family with PMH of hypertension, diabetes mellitus ty II, GERD, breast cancer s/p CHemo-XRT, COPD, arthritis, chronic kidney disease (unknown stage), chronic leg edema--she states this had been going on for 4-5 yrs OPTA [not worsened by chemo-XRT]  Admitted 10/12/14 c  shortness of breath and syncope. She had been doing fine until 11:30 on night of admission when she started having SOB. -cough and - chest pain.   Per her daughter, patient did some exertion, then she passed out for few seconds-LOC witnessed by daughter O2 sat 60% on arrival She did not have seizure. No head injury.   On Bumex for chronic leg edema, but denies congestive heart failure.  She states that her leg swelling has been worsening recently.   BNP of 143, initial troponin was negative, afebrile, slight bradycardia, elevated creatinine at 3.60 (baseline unknown) and BUN at 42. ABG showed pH 7.206, PCO2 64.1, PO2 252. Chest x-ray showed alvola edema. U/A negative. Renal ultrasound negative for hydronephrosis. . 2D echo doneand her 2nd and 3rd troponins both returned elevated at 0.51 and 0.58. EKG shows SR with LBBB (no prior EKGs to compare to).  Patient was found to have NSTEMI and had Cardiac cath 10/19/14 Venous mapping for perm access doen and had Temp Dialysis cath placed in RU chest  Getting dilaysis now and is to have permanent vascular access/fistula placed by Dr. Bridgett Larsson of vascular surgery 10/26/14     Assessment & Plan    Acute on chronic hypoxic respiratory failure likely due to NSTEMI and acute on chronic systolic CHF - Progressively improving  Acute on chronic systolic CHF exacerbation:  -LBBB of unknown duration, troponin  elevation -not candidate for PPM -Echo from 5/16 in [place] shows 55-60%- no wall abnormalities, 2-D echo 6/28 showed EF of 30% with akinesis of inferior septum, akinesis of inferior wall, hypokinesis of anterior septum and akinesis of anterior wall -Not a candidate for ACEI/ARB due to chronic kidney disease -Continue Coreg, nitrates, hydralazine -dialysis for volume: cardiology did d/c lasix on 7/8  Anemia-normocytic and probably of chronic disease iron/TIBC ratio above 18% -If below 7 would transfuse -Iron studies done 7/5 - Aranesp per Renal  NSTEMI - 2-D echo 6/28 showed EF of 30% with akinesis of inferior septum, akinesis of inferior wall, hypokinesis of anterior septum and akinesis of anterior wall -Cardiac cath 10/19/14  Mild nonobstructive CAD  Severe pulmonary HTN  Elevated LV filling presss.  Normal cardiac output  AKI on CKD (chronic kidney disease):  -baseline appears to be: 3 1/16, underlying stage IV CKD -US-renal- medical renal disease - Nephrology input appreciated, creatinine continues to ? and GFR ? - 1st hemodialysis 10/21/14---Access has been placed R upper Chest 10/21/14 and patientfor venous mapping -Transient hyperkalemia Rx with Kayexalate per Nephro -For Optimizationnote that nephrology is trying to adjust her antihypertensives to get her to goal weightTuesday -nutritionist to see prior to d/c to teach about Renal diet  COPD:  - Does not seem to have acute exacerbation- wheezing was most likely fluid  DM-II- Hemoglobin A1c: 9.4  Insulin-dependent, uncontrolled - continue Levemir 60 units, meal coverage to 10 units 3 times a day, sliding scale insulin  -sugars  110-140'as  Hx of Breast cancer:  -s/p of L mastectomy, chemotherapy and radiation therapy.oncologist -Last seen was 2 months ago, was told that her breast cancer was stable. -Has restriction to LUE 2/2 [place]au d'orange -Follow-up with her oncologist in [place].  Hypertension: Improving today-nephrology  adjustment made on 7/11 -Continue Coreg 25 bid--->6. 25 twice a day per nephrology -nitrates imdur 120 daily-->60 mg daily per nephrology - hydralazine 100 q 8-->25 mg every 8 per nephrology  GERD: -Continue PPI  -some implication on varied studies of association wit PNA/CKD  OSA? -de-sat 84% off oxygen when asleep -been told she needs Bi-pap but has never used -desat screen ordered  Left Hip pain has history of osteoarthritis : Feeling better today - hip x ray does not show fracture - CT of the left hip showed moderate osteoarthritis with very small joint effusion, no acute or subacute abnormality - Place on pain control, increase Neurontin, added oxycodone    Super morbid Obesity Body mass index is 50.19 kg/(m^2). -encourage weight loss, diet control  Code Status: full code  Family Communication: Discussed in detail with the patient, all imaging results, lab results explained to the patient    Disposition Plan: tx to tele on 10/21/14  Time Spent in minutes   15 minutes  Procedures  2d echo Left hip xray Renal US CT of the left hip Cardiac CATH  Consults   Cardiology  Nephrology   DVT Prophylaxis  heparin drip   Medications  Scheduled Meds: . antiseptic oral rinse  7 mL Mouth Rinse BID  . aspirin EC  81 mg Oral Daily  . atorvastatin  40 mg Oral q1800  . carvedilol  6.25 mg Oral BID WC  . [START ON 10/26/2014] cefUROXime (ZINACEF)  IV  1.5 g Intravenous To SS-Surg  . [START ON 10/27/2014] darbepoetin (ARANESP) injection - NON-DIALYSIS  100 mcg Subcutaneous Q Wed-1800  . docusate sodium  100 mg Oral BID  . gabapentin  300 mg Oral QHS  . heparin  5,000 Units Subcutaneous 3 times per day  . hydrALAZINE  25 mg Oral 3 times per day  . insulin aspart  0-15 Units Subcutaneous TID WC  . insulin aspart  0-5 Units Subcutaneous QHS  . insulin aspart  10 Units Subcutaneous TID WC  . insulin detemir  60 Units Subcutaneous Daily  . [START ON 10/26/2014] isosorbide  mononitrate  60 mg Oral Daily  . lidocaine  1 patch Transdermal Q24H  . multivitamin with minerals  1 tablet Oral Daily  . pantoprazole  40 mg Oral Daily  . sodium chloride  3 mL Intravenous Q12H   Continuous Infusions:   PRN Meds:.sodium chloride, acetaminophen, albuterol, capsaicin, guaiFENesin-codeine, ipratropium-albuterol, ondansetron (ZOFRAN) IV, oxyCODONE, polyethylene glycol, sodium chloride   Antibiotics   Anti-infectives    Start     Dose/Rate Route Frequency Ordered Stop   10/26/14 0800  cefUROXime (ZINACEF) 1.5 g in dextrose 5 % 50 mL IVPB     1.5 g 100 mL/hr over 30 Minutes Intravenous To ShortStay Surgical 10/25/14 1459 10/27/14 0800   10/21/14 1044  ceFAZolin (ANCEF) 2-3 GM-% IVPB SOLR    Comments:  Shelton, Whitney   : cabinet override      10/21/14 1044 10/21/14 2259   10/21/14 0830  ceFAZolin (ANCEF) powder 2 g    Comments:  Give in  IR   2 g Other To Surgery 10/21/14 0829 10/21/14 1050     Objective:   Blood pressure 110/50, pulse 66, temperature  53 F (36.7 C), temperature source Oral, resp. rate 18, height 5\' 5"  (1.651 m), weight 136.8 kg (301 lb 9.4 oz), SpO2 99 %.  Wt Readings from Last 3 Encounters:  10/25/14 136.8 kg (301 lb 9.4 oz)     Intake/Output Summary (Last 24 hours) at 10/25/14 1659 Last data filed at 10/25/14 1500  Gross per 24 hour  Intake    480 ml  Output   3977 ml  Net  -3497 ml    Exam  General: Alert and oriented x 3, NAD. Thick neck  HEENT:  PERRLA, EOMI,  Neck: Supple,    CVS: S1 S2clear, no mrg  Respiratory: Clear to auscultation bilaterally  Abdomen:  obese, soft, NT, ND, NBS  Ext: no cyanosis clubbing, 1+ edema b/l LE   Neuro: grossly wnl  Data Review   Micro Results No results found for this or any previous visit (from the past 240 hour(s)).  Radiology Reports US Renal  10/12/2014   CLINICAL DATA:  Acute renal insufficiency  EXAM: RENAL / URINARY TRACT ULTRASOUND COMPLETE  COMPARISON:  None.  FINDINGS:  Right Kidney:  Length: 11.7 cm.  Mildly increased echogenicity.  No hydronephrosis.  Left Kidney:  Length: 12.1 cm.  Mildly increased echogenicity.  No hydronephrosis.  Bladder:  Appears normal for degree of bladder distention.  IMPRESSION: Negative for hydronephrosis. There is mildly increased renal parenchymal echogenicity consistent with medical renal disease.   Electronically Signed   By: Andreas Newport M.D.   On: 10/12/2014 06:29   Ct Hip Left Wo Contrast  10/16/2014   CLINICAL DATA:  Patient presented 10/12/2014 with shortness of breath and syncope and was found to have a non ST elevation MI. Prior history of left breast cancer post mastectomy. Acute superimposed upon chronic left hip pain. No known injuries.  EXAM: CT OF THE LEFT HIP WITHOUT CONTRAST  TECHNIQUE: Multidetector CT imaging of the left hip was performed according to the standard protocol. Multiplanar CT image reconstructions were also generated.  COMPARISON:  Left hip x-rays 10/13/2014.  FINDINGS: No evidence of acute, subacute or healed fractures. Severe narrowing of the joint space posteriorly with associated hypertrophic spurring involving the posterior column of the acetabulum. Moderate joint space narrowing elsewhere. Very small joint effusion. No evidence of osseous metastatic disease involving the visualized bones comprising the left hip.  IMPRESSION: 1. No acute or subacute osseous abnormality. 2. Moderate osteoarthritis. 3. Very small joint effusion. 4. No evidence of osseous metastatic disease.   Electronically Signed   By: Evangeline Dakin M.D.   On: 10/16/2014 14:15   Ir Fluoro Guide Cv Line Right  10/21/2014   CLINICAL DATA:  Chronic renal insufficiency, needs access for hemodialysis  EXAM: TUNNELED HEMODIALYSIS CATHETER PLACEMENT WITH ULTRASOUND AND FLUOROSCOPIC GUIDANCE  TECHNIQUE: The procedure, risks, benefits, and alternatives were explained to the patient. Questions regarding the procedure were encouraged and answered.  The patient understands and consents to the procedure. As antibiotic prophylaxis, cefazolin was ordered pre-procedure and administered intravenously within one hour of incision.Patency of the right IJ vein was confirmed with ultrasound with image documentation. An appropriate skin site was determined. Region was prepped using maximum barrier technique including cap and mask, sterile gown, sterile gloves, large sterile sheet, and Chlorhexidine as cutaneous antisepsis. The region was infiltrated locally with 1% lidocaine.  Intravenous Fentanyl and Versed were administered as conscious sedation during continuous cardiorespiratory monitoring by the radiology RN, with a total moderate sedation time of 6 minutes.  Under real-time ultrasound guidance,  the right IJ vein was accessed with a 21 gauge micropuncture needle; the needle tip within the vein was confirmed with ultrasound image documentation. Needle exchanged over the 018 guidewire for transitional dilator, which allowed advancement of a Benson wire into the IVC. Over this, an MPA catheter was advanced. A Hemosplit 19 hemodialysis catheter was tunneled from the right anterior chest wall approach to the right IJ dermatotomy site. The MPA catheter was exchanged over an Amplatz wire for serial vascular dilators which allow placement of a peel-away sheath, through which the catheter was advanced under intermittent fluoroscopy, positioned with its tips in the proximal and midright atrium. Spot chest radiograph confirms good catheter position. No pneumothorax. Catheter was flushed and primed per protocol. Catheter secured externally with O Prolene sutures. The right IJ dermatotomy site was closed with Dermabond.  COMPLICATIONS: COMPLICATIONS None immediate  FLUOROSCOPY TIME:  30 seconds, 22 mGy  COMPARISON:  None  IMPRESSION: 1. Technically successful placement of tunneled right IJ hemodialysis catheter with ultrasound and fluoroscopic guidance. Ready for routine use.   ACCESS: Remains approachable for percutaneous intervention as needed.   Electronically Signed   By: Lucrezia Europe M.D.   On: 10/21/2014 11:15   Ir US Guide Vasc Access Right  10/21/2014   CLINICAL DATA:  Chronic renal insufficiency, needs access for hemodialysis  EXAM: TUNNELED HEMODIALYSIS CATHETER PLACEMENT WITH ULTRASOUND AND FLUOROSCOPIC GUIDANCE  TECHNIQUE: The procedure, risks, benefits, and alternatives were explained to the patient. Questions regarding the procedure were encouraged and answered. The patient understands and consents to the procedure. As antibiotic prophylaxis, cefazolin was ordered pre-procedure and administered intravenously within one hour of incision.Patency of the right IJ vein was confirmed with ultrasound with image documentation. An appropriate skin site was determined. Region was prepped using maximum barrier technique including cap and mask, sterile gown, sterile gloves, large sterile sheet, and Chlorhexidine as cutaneous antisepsis. The region was infiltrated locally with 1% lidocaine.  Intravenous Fentanyl and Versed were administered as conscious sedation during continuous cardiorespiratory monitoring by the radiology RN, with a total moderate sedation time of 6 minutes.  Under real-time ultrasound guidance, the right IJ vein was accessed with a 21 gauge micropuncture needle; the needle tip within the vein was confirmed with ultrasound image documentation. Needle exchanged over the 018 guidewire for transitional dilator, which allowed advancement of a Benson wire into the IVC. Over this, an MPA catheter was advanced. A Hemosplit 19 hemodialysis catheter was tunneled from the right anterior chest wall approach to the right IJ dermatotomy site. The MPA catheter was exchanged over an Amplatz wire for serial vascular dilators which allow placement of a peel-away sheath, through which the catheter was advanced under intermittent fluoroscopy, positioned with its tips in the proximal and  midright atrium. Spot chest radiograph confirms good catheter position. No pneumothorax. Catheter was flushed and primed per protocol. Catheter secured externally with O Prolene sutures. The right IJ dermatotomy site was closed with Dermabond.  COMPLICATIONS: COMPLICATIONS None immediate  FLUOROSCOPY TIME:  30 seconds, 22 mGy  COMPARISON:  None  IMPRESSION: 1. Technically successful placement of tunneled right IJ hemodialysis catheter with ultrasound and fluoroscopic guidance. Ready for routine use.  ACCESS: Remains approachable for percutaneous intervention as needed.   Electronically Signed   By: Lucrezia Europe M.D.   On: 10/21/2014 11:15   Dg Chest Port 1 View  10/15/2014   CLINICAL DATA:  CHF  EXAM: PORTABLE CHEST - 1 VIEW  COMPARISON:  10/12/2014  FINDINGS: Cardiomegaly with mild perihilar  edema, improved. Left lung base is obscured, likely a combination of atelectasis and small pleural effusion.  IMPRESSION: Cardiomegaly with mild perihilar edema, improved.  Suspected small left pleural effusion.   Electronically Signed   By: Julian Hy M.D.   On: 10/15/2014 08:53   Dg Chest Portable 1 View  10/12/2014   CLINICAL DATA:  Respiratory distress  EXAM: PORTABLE CHEST - 1 VIEW  COMPARISON:  None.  FINDINGS: Central and basilar airspace opacities are present bilaterally. There is cardiomegaly. There probably is a small left pleural effusion.  IMPRESSION: Extensive airspace opacities and central and basilar distribution. This may represent alveolar edema associated with congestive heart failure. Probable left effusion. Cannot exclude infectious infiltrates.   Electronically Signed   By: Andreas Newport M.D.   On: 10/12/2014 01:51   Dg Hip Unilat With Pelvis 2-3 Views Left  10/13/2014   CLINICAL DATA:  Left hip pain.  EXAM: LEFT HIP (WITH PELVIS) 2-3 VIEWS  COMPARISON:  None.  FINDINGS: The left hip is located. No acute bone or soft tissue abnormality is present. Degenerative changes are noted in the SI  joints bilaterally and lower lumbar spine.  IMPRESSION: 1. Normal radiographic appearance of the left hip. 2. Degenerative changes within the lower lumbar spine and SI joints.   Electronically Signed   By: San Morelle M.D.   On: 10/13/2014 09:48    CBC  Recent Labs Lab 10/21/14 0340 10/21/14 2315 10/22/14 1530 10/23/14 0844 10/25/14 0829  WBC 8.6 9.1 10.0 11.6* 12.1*  HGB 7.5* 7.8* 7.9* 7.4* 7.2*  HCT 25.1* 25.2* 26.0* 24.5* 24.1*  PLT 195 225 216 199 205  MCV 84.5 85.1 84.4 85.7 86.1  MCH 25.3* 26.4 25.6* 25.9* 25.7*  MCHC 29.9* 31.0 30.4 30.2 29.9*  RDW 14.8 15.0 15.1 15.1 15.2  LYMPHSABS  --  1.2  --   --  1.2  MONOABS  --  1.0  --   --  0.9  EOSABS  --  0.1  --   --  0.2  BASOSABS  --  0.0  --   --  0.0    Chemistries   Recent Labs Lab 10/21/14 0500 10/21/14 2315 10/22/14 1530 10/23/14 0844 10/25/14 0830  NA 136 138 138 134* 134*  K 4.9 4.5 3.9 4.2 4.2  CL 97* 99* 97* 97* 98*  CO2 28 30 30 30 28   GLUCOSE 143* 141* 114* 212* 134*  BUN 96* 96* 57* 33* 37*  CREATININE 5.27* 4.93* 3.64* 3.35* 5.53*  CALCIUM 7.1* 7.1* 7.7* 7.6* 8.0*   ------------------------------------------------------------------------------------------------------------------ estimated creatinine clearance is 13.1 mL/min (by C-G formula based on Cr of 5.53). ------------------------------------------------------------------------------------------------------------------ No results for input(s): HGBA1C in the last 72 hours. ------------------------------------------------------------------------------------------------------------------ No results for input(s): CHOL, HDL, LDLCALC, TRIG, CHOLHDL, LDLDIRECT in the last 72 hours. ------------------------------------------------------------------------------------------------------------------ No results for input(s): TSH, T4TOTAL, T3FREE, THYROIDAB in the last 72 hours.  Invalid input(s):  FREET3 ------------------------------------------------------------------------------------------------------------------ No results for input(s): VITAMINB12, FOLATE, FERRITIN, TIBC, IRON, RETICCTPCT in the last 72 hours.  Coagulation profile  Recent Labs Lab 10/21/14 0340  INR 1.05    No results for input(s): DDIMER in the last 72 hours.  Cardiac Enzymes No results for input(s): CKMB, TROPONINI, MYOGLOBIN in the last 168 hours.  Invalid input(s): CK ------------------------------------------------------------------------------------------------------------------ Invalid input(s): [place]  10/24/14 1123 10/24/14 1620 10/24/14 2143 10/25/14 0641 10/25/14 1311 10/25/14 1651  GLUCAP 122* 132* 147* 105* 131* 198*    Verneita Griffes, MD [facility] [phone]

## 2014-10-25 NOTE — Care Management (Signed)
Important Message  Patient Details  Name: Jordan [name] MRN: [id number] Date of Birth: Aug 25, 1940   Medicare Important Message Given:  Yes-fourth notification given    Nathen May 10/25/2014, 2:43 PM

## 2014-10-25 NOTE — Progress Notes (Signed)
[facility] Progress Note   Subjective: no problems overnight  Filed Vitals:   10/25/14 0900 10/25/14 0929 10/25/14 0959 10/25/14 1031  BP: 109/44 109/45 95/33 119/45  Pulse: 61 74 75 68  Temp:      TempSrc:      Resp:      Height:      Weight:      SpO2:       Exam: Alert no distress No jvd Chest clear bilat RRR +2/6 SEM no RG Abd obese soft ntnd no abd wall edema Ext 1+ pitting pretib edema bilat Nonpitting LUE mild edema R IJ tunneled hd cath Neuro is alert, nf , ox 3  R/LHC 7/5 > mild nonobstructive CAD, severe pulm HTN, high LVEDP, normal cardiac output     Assessment: 1. New ESRD / acute on CKD IV / cardiorenal syndrome - HD #4 today. For R arm AVF tomorrow, has hx of breast Ca-related lymphedema L arm.  2. NSTEMI demand ischemia, heart cath neg for sig CAD 3. Vol excess - still vol overloaded, high LVEDP on 7/5 per heart cath , was 142kg that day so dry wt should be mid 130's most likely or lower 4. CHF - echo showed EF 30% but heart cath showed "normal cardiac output" 5. Anemia on 60/wk darbe , tsat 27%. Will ^darbe 100/wk, Hb 7's 6. HTN bp's soft on hydral/ coreg/ isordil > will decrease meds  Plan - HD today, max UF, lower dry wt further , decreased BP meds/ nitrates to allow more vol removal    Kelly Splinter MD  pager [phone]    cell [phone]  10/25/2014, 11:02 AM     Recent Labs Lab 10/22/14 1530 10/23/14 0844 10/25/14 0830  NA 138 134* 134*  K 3.9 4.2 4.2  CL 97* 97* 98*  CO2 30 30 28   GLUCOSE 114* 212* 134*  BUN 57* 33* 37*  CREATININE 3.64* 3.35* 5.53*  CALCIUM 7.7* 7.6* 8.0*  PHOS 4.3 3.8 4.7*    Recent Labs Lab 10/22/14 1530 10/23/14 0844 10/25/14 0830  ALBUMIN 2.8* 2.6* 2.7*    Recent Labs Lab 10/21/14 2315 10/22/14 1530 10/23/14 0844 10/25/14 0829  WBC 9.1 10.0 11.6* 12.1*  NEUTROABS 6.8  --   --  9.8*  HGB 7.8* 7.9* 7.4* 7.2*  HCT 25.2* 26.0* 24.5* 24.1*  MCV 85.1 84.4 85.7 86.1  PLT 225 216 199  205   . antiseptic oral rinse  7 mL Mouth Rinse BID  . aspirin EC  81 mg Oral Daily  . atorvastatin  40 mg Oral q1800  . carvedilol  25 mg Oral BID WC  . darbepoetin (ARANESP) injection - NON-DIALYSIS  60 mcg Subcutaneous Q Wed-1800  . docusate sodium  100 mg Oral BID  . gabapentin  300 mg Oral QHS  . heparin  5,000 Units Subcutaneous 3 times per day  . hydrALAZINE  100 mg Oral 3 times per day  . insulin aspart  0-15 Units Subcutaneous TID WC  . insulin aspart  0-5 Units Subcutaneous QHS  . insulin aspart  10 Units Subcutaneous TID WC  . insulin detemir  60 Units Subcutaneous Daily  . isosorbide mononitrate  120 mg Oral Daily  . lidocaine  1 patch Transdermal Q24H  . multivitamin with minerals  1 tablet Oral Daily  . pantoprazole  40 mg Oral Daily  . sodium chloride  3 mL Intravenous Q12H     sodium chloride, acetaminophen, albuterol, capsaicin, guaiFENesin-codeine, ipratropium-albuterol, ondansetron (ZOFRAN)  IV, oxyCODONE, polyethylene glycol, sodium chloride

## 2014-10-25 NOTE — Progress Notes (Signed)
   Reviewed recent notes Fluid mgt by HD No further cardiac recs at this time.   Will sign off. Call for questions   Nahser, Wonda Cheng, MD

## 2014-10-25 NOTE — Progress Notes (Signed)
OT Cancellation Note  Patient Details Name: Jordan [name] MRN: [id number] DOB: Sep 10, 1940   Cancelled Treatment:    Reason Eval/Treat Not Completed: Patient at procedure or test/ unavailable (HD). Will continue to follow.  Malka So 10/25/2014, 10:03 AM

## 2014-10-25 NOTE — Progress Notes (Signed)
UR completed 

## 2014-10-26 ENCOUNTER — Encounter (HOSPITAL_COMMUNITY): Payer: Self-pay | Admitting: Certified Registered Nurse Anesthetist

## 2014-10-26 ENCOUNTER — Inpatient Hospital Stay (HOSPITAL_COMMUNITY): Payer: Medicare (Managed Care) | Admitting: Certified Registered Nurse Anesthetist

## 2014-10-26 ENCOUNTER — Encounter (HOSPITAL_COMMUNITY)
Admission: EM | Disposition: A | Discharge status: Home Health | Payer: Self-pay | Source: Home / Self Care | Attending: Family Medicine

## 2014-10-26 HISTORY — PX: AV FISTULA PLACEMENT: SHX1204

## 2014-10-26 LAB — BASIC METABOLIC PANEL
ANION GAP: 8 (ref 5–15)
BUN: 26 mg/dL — ABNORMAL HIGH (ref 6–20)
CO2: 29 mmol/L (ref 22–32)
CREATININE: 4.51 mg/dL — AB (ref 0.44–1.00)
Calcium: 7.9 mg/dL — ABNORMAL LOW (ref 8.9–10.3)
Chloride: 99 mmol/L — ABNORMAL LOW (ref 101–111)
GFR calc Af Amer: 10 mL/min — ABNORMAL LOW (ref >60)
GFR, EST NON AFRICAN AMERICAN: 9 mL/min — AB (ref >60)
Glucose, Bld: 122 mg/dL — ABNORMAL HIGH (ref 65–99)
POTASSIUM: 4.4 mmol/L (ref 3.5–5.1)
Sodium: 136 mmol/L (ref 135–145)

## 2014-10-26 LAB — GLUCOSE, CAPILLARY
GLUCOSE-CAPILLARY: 110 mg/dL — AB (ref 65–99)
Glucose-Capillary: 126 mg/dL — ABNORMAL HIGH (ref 65–99)
Glucose-Capillary: 139 mg/dL — ABNORMAL HIGH (ref 65–99)
Glucose-Capillary: 212 mg/dL — ABNORMAL HIGH (ref 65–99)
Glucose-Capillary: 186 mg/dL — ABNORMAL HIGH (ref 65–99)

## 2014-10-26 SURGERY — ARTERIOVENOUS (AV) FISTULA CREATION
Anesthesia: General | Site: Arm Upper | Laterality: Right

## 2014-10-26 MED ORDER — SODIUM CHLORIDE 0.9 % IR SOLN
Status: DC | PRN
  Administered 2014-10-26: 11:00:00

## 2014-10-26 MED ORDER — MEPERIDINE HCL 25 MG/ML IJ SOLN: 6.2500 mg | INTRAMUSCULAR | Status: DC | PRN

## 2014-10-26 MED ORDER — FENTANYL CITRATE (PF) 250 MCG/5ML IJ SOLN
INTRAMUSCULAR | Status: AC
  Filled 2014-10-26: qty 5

## 2014-10-26 MED ORDER — FENTANYL CITRATE (PF) 100 MCG/2ML IJ SOLN: 25.0000 ug | INTRAMUSCULAR | Status: DC | PRN

## 2014-10-26 MED ORDER — MIDAZOLAM HCL 2 MG/2ML IJ SOLN: 0.5000 mg | Freq: Once | INTRAMUSCULAR | Status: DC | PRN

## 2014-10-26 MED ORDER — LIDOCAINE HCL (CARDIAC) 20 MG/ML IV SOLN
INTRAVENOUS | Status: DC | PRN
  Administered 2014-10-26: 25 mg via INTRAVENOUS

## 2014-10-26 MED ORDER — PROPOFOL 10 MG/ML IV BOLUS
INTRAVENOUS | Status: DC | PRN
  Administered 2014-10-26: 120 mg via INTRAVENOUS

## 2014-10-26 MED ORDER — THROMBIN 20000 UNITS EX SOLR
CUTANEOUS | Status: AC
  Filled 2014-10-26: qty 20000

## 2014-10-26 MED ORDER — ATORVASTATIN CALCIUM 40 MG PO TABS: 40.0000 mg | ORAL_TABLET | Freq: Every day | ORAL | Status: DC

## 2014-10-26 MED ORDER — ISOSORBIDE MONONITRATE ER 60 MG PO TB24: 60.0000 mg | ORAL_TABLET | Freq: Every day | ORAL | Status: DC

## 2014-10-26 MED ORDER — ROCURONIUM BROMIDE 100 MG/10ML IV SOLN
INTRAVENOUS | Status: DC | PRN
  Administered 2014-10-26: 25 mg via INTRAVENOUS

## 2014-10-26 MED ORDER — FENTANYL CITRATE (PF) 100 MCG/2ML IJ SOLN
INTRAMUSCULAR | Status: DC | PRN
  Administered 2014-10-26: 100 ug via INTRAVENOUS
  Administered 2014-10-26: 25 ug via INTRAVENOUS

## 2014-10-26 MED ORDER — SODIUM CHLORIDE 0.9 % IV SOLN
INTRAVENOUS | Status: DC
  Administered 2014-10-26: 09:00:00 via INTRAVENOUS

## 2014-10-26 MED ORDER — NEOSTIGMINE METHYLSULFATE 10 MG/10ML IV SOLN
INTRAVENOUS | Status: DC | PRN
Start: 2014-10-26 — End: 2014-10-26
  Administered 2014-10-26 (×2): 2 mg via INTRAVENOUS

## 2014-10-26 MED ORDER — EPHEDRINE SULFATE 50 MG/ML IJ SOLN
INTRAMUSCULAR | Status: DC | PRN
  Administered 2014-10-26 (×2): 10 mg via INTRAVENOUS

## 2014-10-26 MED ORDER — PROPOFOL 10 MG/ML IV BOLUS
INTRAVENOUS | Status: AC
  Filled 2014-10-26: qty 20

## 2014-10-26 MED ORDER — GLYCOPYRROLATE 0.2 MG/ML IJ SOLN
INTRAMUSCULAR | Status: DC | PRN
  Administered 2014-10-26 (×2): 0.2 mg via INTRAVENOUS

## 2014-10-26 MED ORDER — PHENYLEPHRINE HCL 10 MG/ML IJ SOLN
10.0000 mg | INTRAMUSCULAR | Status: DC | PRN
  Administered 2014-10-26: 30 ug/min via INTRAVENOUS

## 2014-10-26 MED ORDER — HYDRALAZINE HCL 25 MG PO TABS: 25.0000 mg | ORAL_TABLET | Freq: Three times a day (TID) | ORAL | Status: DC

## 2014-10-26 MED ORDER — ASPIRIN 81 MG PO TBEC: 81.0000 mg | DELAYED_RELEASE_TABLET | Freq: Every day | ORAL | Status: DC

## 2014-10-26 MED ORDER — PROMETHAZINE HCL 25 MG/ML IJ SOLN: 6.2500 mg | INTRAMUSCULAR | Status: DC | PRN

## 2014-10-26 MED ORDER — 0.9 % SODIUM CHLORIDE (POUR BTL) OPTIME
TOPICAL | Status: DC | PRN
  Administered 2014-10-26: 1000 mL

## 2014-10-26 MED ORDER — SODIUM CHLORIDE 0.9 % IV SOLN
125.0000 mg | INTRAVENOUS | Status: DC
  Administered 2014-10-27: 125 mg via INTRAVENOUS
  Filled 2014-10-26 (×2): qty 10

## 2014-10-26 SURGICAL SUPPLY — 33 items
ARMBAND PINK RESTRICT EXTREMIT (MISCELLANEOUS) ×2 IMPLANT
CANISTER SUCTION 2500CC (MISCELLANEOUS) ×2 IMPLANT
CLIP TI MEDIUM 6 (CLIP) ×2 IMPLANT
CLIP TI WIDE RED SMALL 6 (CLIP) ×2 IMPLANT
COVER PROBE W GEL 5X96 (DRAPES) ×2 IMPLANT
DECANTER SPIKE VIAL GLASS SM (MISCELLANEOUS) IMPLANT
ELECT REM PT RETURN 9FT ADLT (ELECTROSURGICAL) ×2
ELECTRODE REM PT RTRN 9FT ADLT (ELECTROSURGICAL) ×1 IMPLANT
GLOVE BIO SURGEON STRL SZ7 (GLOVE) ×2 IMPLANT
GLOVE BIOGEL PI IND STRL 6 (GLOVE) ×1 IMPLANT
GLOVE BIOGEL PI IND STRL 6.5 (GLOVE) ×1 IMPLANT
GLOVE BIOGEL PI IND STRL 7.5 (GLOVE) ×1 IMPLANT
GLOVE BIOGEL PI INDICATOR 6 (GLOVE) ×1
GLOVE BIOGEL PI INDICATOR 6.5 (GLOVE) ×1
GLOVE BIOGEL PI INDICATOR 7.5 (GLOVE) ×1
GLOVE ECLIPSE 6.5 STRL STRAW (GLOVE) ×2 IMPLANT
GLOVE SURG SS PI 8.0 STRL IVOR (GLOVE) ×2 IMPLANT
GOWN STRL REUS W/ TWL LRG LVL3 (GOWN DISPOSABLE) ×3 IMPLANT
GOWN STRL REUS W/TWL LRG LVL3 (GOWN DISPOSABLE) ×3
KIT BASIN OR (CUSTOM PROCEDURE TRAY) ×2 IMPLANT
KIT ROOM TURNOVER OR (KITS) ×2 IMPLANT
LIQUID BAND (GAUZE/BANDAGES/DRESSINGS) ×2 IMPLANT
NS IRRIG 1000ML POUR BTL (IV SOLUTION) ×2 IMPLANT
PACK CV ACCESS (CUSTOM PROCEDURE TRAY) ×2 IMPLANT
PAD ARMBOARD 7.5X6 YLW CONV (MISCELLANEOUS) ×4 IMPLANT
SPONGE SURGIFOAM ABS GEL 100 (HEMOSTASIS) IMPLANT
SUT MNCRL AB 4-0 PS2 18 (SUTURE) ×2 IMPLANT
SUT PROLENE 6 0 BV (SUTURE) ×2 IMPLANT
SUT PROLENE 7 0 BV 1 (SUTURE) ×2 IMPLANT
SUT VIC AB 3-0 SH 27 (SUTURE) ×1
SUT VIC AB 3-0 SH 27X BRD (SUTURE) ×1 IMPLANT
UNDERPAD 30X30 INCONTINENT (UNDERPADS AND DIAPERS) ×2 IMPLANT
WATER STERILE IRR 1000ML POUR (IV SOLUTION) IMPLANT

## 2014-10-26 NOTE — Progress Notes (Signed)
10/26/2014 1542 Hemodialysis Outpatient Note;  this patient has been accepted at the [facility] center on a Tuesday, Thursday and Saturday 2nd shift schedule. The center can begin treatment on Thursday July 14 at 11:30 AM. Thank you. Gordy Savers

## 2014-10-26 NOTE — Anesthesia Postprocedure Evaluation (Signed)
  Anesthesia Post-op Note  Patient: Jordan [name]  Procedure(s) Performed: Procedure(s): Right Brachiocephalic Arteriovenous FISTULA CREATION (Right)  Patient Location: PACU  Anesthesia Type:General  Level of Consciousness: awake, alert , oriented and patient cooperative  Airway and Oxygen Therapy: Patient Spontanous Breathing and Patient connected to nasal cannula oxygen  Post-op Pain: none  Post-op Assessment: Post-op Vital signs reviewed, Patient's Cardiovascular Status Stable, Respiratory Function Stable, Patent Airway, No signs of Nausea or vomiting and Pain level controlled              Post-op Vital Signs: Reviewed and stable  Last Vitals:  Filed Vitals:   10/26/14 1402  BP: 112/45  Pulse:   Temp:   Resp:     Complications: No apparent anesthesia complications

## 2014-10-26 NOTE — Progress Notes (Signed)
PT Cancellation Note  Patient Details Name: Jordan [name] MRN: [id number] DOB: 04/30/1940   Cancelled Treatment:    Reason Eval/Treat Not Completed: Patient at procedure or test/unavailable (pt in OR for fistula and unable to be seen at this time will attempt next date)   Melford Aase 10/26/2014, 9:59 AM Elwyn Reach, [place]

## 2014-10-26 NOTE — Progress Notes (Signed)
Doing good. No other problems. Tolerating diet No chest pain or shortness of breath Fistula placed today No dioarr, no n/v/cp  Brief HPI    74 y.o.? from [place] visiting family with PMH of hypertension, diabetes mellitus ty II, GERD, breast cancer s/p CHemo-XRT, COPD, arthritis, chronic kidney disease (unknown stage), chronic leg edema--she states this had been going on for 4-5 yrs OPTA [not worsened by chemo-XRT]  Admitted 10/12/14 c  shortness of breath and syncope. She had been doing fine until 11:30 on night of admission when she started having SOB. -cough and - chest pain.   Per her daughter, patient did some exertion, then she passed out for few seconds-LOC witnessed by daughter O2 sat 60% on arrival She did not have seizure. No head injury.   On Bumex for chronic leg edema, but denies congestive heart failure.  She states that her leg swelling has been worsening recently.   BNP of 143, initial troponin was negative, afebrile, slight bradycardia, elevated creatinine at 3.60 (baseline unknown) and BUN at 42. ABG showed pH 7.206, PCO2 64.1, PO2 252. Chest x-ray showed alvola edema. U/A negative. Renal ultrasound negative for hydronephrosis. . 2D echo doneand her 2nd and 3rd troponins both returned elevated at 0.51 and 0.58. EKG shows SR with LBBB (no prior EKGs to compare to).  Patient was found to have NSTEMI and had Cardiac cath 10/19/14 Venous mapping for perm access doen and had Temp Dialysis cath placed in RU chest  Getting dilaysis now and had permanent vascular access/fistula placed by Dr. Bridgett Larsson of vascular surgery 10/26/14 DIalsysi x 1 more 7/13 and then potential Dc home MEds reconciled prior to d/c     Assessment & Plan    Acute on chronic hypoxic respiratory failure likely due to NSTEMI and acute on chronic systolic CHF - Progressively improving  Acute on chronic systolic CHF exacerbation:  -LBBB of unknown duration, troponin elevation -not candidate  for PPM -Echo from 5/16 in [place] shows 55-60%- no wall abnormalities, 2-D echo 6/28 showed EF of 30% with akinesis of inferior septum, akinesis of inferior wall, hypokinesis of anterior septum and akinesis of anterior wall -Not a candidate for ACEI/ARB due to chronic kidney disease -Continue Coreg, nitrates, hydralazine at lower doses in an attempt to offload more fluid per nephrology -dialysis for volume: cardiology did d/c lasix on 7/8  Anemia-normocytic and probably of chronic disease iron/TIBC ratio above 18% -If below 7 would transfuse -Iron studies done 7/5 - Aranesp per Renal  NSTEMI - 2-D echo 6/28 showed EF of 30% with akinesis of inferior septum, akinesis of inferior wall, hypokinesis of anterior septum and akinesis of anterior wall -continue Aspirin 81 daily -Cardiac cath 10/19/14  Mild nonobstructive CAD  Severe pulmonary HTN  Elevated LV filling presss.  Normal cardiac output  AKI on CKD (chronic kidney disease):  -baseline appears to be: 3 1/16, underlying stage IV CKD -US-renal- medical renal disease - Nephrology input appreciated, creatinine continues to ? and GFR ? - 1st hemodialysis 10/21/14---Access has been placed R upper Chest 10/21/14 and patienthad permenent Access placed 7/13  -Set up for OP dialysis t/th/sat [place] -Transient hyperkalemia Rx with Kayexalate per Nephro -For Optimization note that nephrology is trying to adjust her antihypertensives to get her to goal weightT uesday -nutritionist to see prior to d/c to teach about Renal diet  COPD:  - Does not seem to have acute exacerbation- wheezing was most likely fluid  DM-II -  Hemoglobin A1c: 9.4 -Insulin-dependent, uncontrolled - continue Levemir 70 units, meal coverage to 10 units 3 times a day, sliding scale insulin  - sugars 110-140's  Hx of Breast cancer:  -s/p of L mastectomy, chemotherapy and radiation therapy.oncologist -Last seen was 2 months ago, was told that her breast cancer was  stable. -Has restriction to LUE 2/2 [place] -Follow-up with her oncologist in [place].  Hypertension: Improving today-nephrology adjustment made on 7/11 -Continue Coreg 25 bid--->6. 25 twice a day per nephrology -nitrates imdur 120 daily-->60 mg daily per nephrology - hydralazine 100 q 8-->25 mg every 8 per nephrology  GERD: -Continue PPI  -some implication on varied studies of association wit PNA/CKD  OSA? -de-sat 84% off oxygen when asleep -been told she needs Bi-pap but has never used -need home O2  Left Hip pain has history of osteoarthritis : Feeling better today - hip x ray does not show fracture - CT of the left hip showed moderate osteoarthritis with very small joint effusion, no acute or subacute abnormality - Place on pain control, increase Neurontin, added oxycodone    Super morbid Obesity Body mass index is 50.41 kg/(m^2). -encourage weight loss, diet control  Code Status: full code  Family Communication: Discussed in detail with the patient, all imaging results, lab results explained to the patient    Disposition Plan: tx to tele on 10/21/14  Time Spent in minutes   15 minutes  Procedures  2d echo Left hip xray Renal US CT of the left hip Cardiac CATH  Consults   Cardiology  Nephrology   DVT Prophylaxis  heparin drip   Medications  Scheduled Meds: . antiseptic oral rinse  7 mL Mouth Rinse BID  . aspirin EC  81 mg Oral Daily  . atorvastatin  40 mg Oral q1800  . carvedilol  6.25 mg Oral BID WC  . [START ON 10/27/2014] darbepoetin (ARANESP) injection - NON-DIALYSIS  100 mcg Subcutaneous Q Wed-1800  . docusate sodium  100 mg Oral BID  . [START ON 10/27/2014] ferric gluconate (FERRLECIT/NULECIT) IV  125 mg Intravenous Q M,W,F-HD  . gabapentin  300 mg Oral QHS  . heparin  5,000 Units Subcutaneous 3 times per day  . hydrALAZINE  25 mg Oral 3 times per day  . insulin aspart  0-15 Units Subcutaneous TID WC  . insulin aspart  0-5 Units Subcutaneous QHS   . insulin aspart  10 Units Subcutaneous TID WC  . insulin detemir  60 Units Subcutaneous Daily  . isosorbide mononitrate  60 mg Oral Daily  . lidocaine  1 patch Transdermal Q24H  . multivitamin with minerals  1 tablet Oral Daily  . pantoprazole  40 mg Oral Daily  . sodium chloride  3 mL Intravenous Q12H   Continuous Infusions: . sodium chloride 10 mL/hr at 10/26/14 0909   PRN Meds:.sodium chloride, acetaminophen, albuterol, capsaicin, guaiFENesin-codeine, ipratropium-albuterol, ondansetron (ZOFRAN) IV, oxyCODONE, polyethylene glycol, sodium chloride   Antibiotics   Anti-infectives    Start     Dose/Rate Route Frequency Ordered Stop   10/26/14 0800  cefUROXime (ZINACEF) 1.5 g in dextrose 5 % 50 mL IVPB     1.5 g 100 mL/hr over 30 Minutes Intravenous To ShortStay Surgical 10/25/14 1459 10/26/14 1047   10/21/14 1044  ceFAZolin (ANCEF) 2-3 GM-% IVPB SOLR    Comments:  Shelton, Whitney   : cabinet override      10/21/14 1044 10/21/14 2259   10/21/14 0830  ceFAZolin (ANCEF) powder 2 g  Comments:  Give in  IR   2 g Other To Surgery 10/21/14 0829 10/21/14 1050     Objective:   Blood pressure 132/38, pulse 58, temperature 98.3 F (36.8 C), temperature source Oral, resp. rate 15, height 5\' 5"  (1.651 m), weight 137.4 kg (302 lb 14.6 oz), SpO2 95 %.  Wt Readings from Last 3 Encounters:  10/26/14 137.4 kg (302 lb 14.6 oz)     Intake/Output Summary (Last 24 hours) at 10/26/14 1530 Last data filed at 10/26/14 1228  Gross per 24 hour  Intake    710 ml  Output      1 ml  Net    709 ml    Exam  General: Alert and oriented x 3, NAD. Thick neck  HEENT:  PERRLA, EOMI,  Neck: Supple,    CVS: S1 S2clear, no mrg  Respiratory: Clear to auscultation bilaterally  Abdomen:  obese, soft, NT, ND, NBS  Ext: no cyanosis clubbing, 1+ edema b/l LE  Skin showss no deficit-Fistula in RUE is stbale and clean    Data Review   Micro Results No results found for this or any previous  visit (from the past 240 hour(s)).  Radiology Reports US Renal  10/12/2014   CLINICAL DATA:  Acute renal insufficiency  EXAM: RENAL / URINARY TRACT ULTRASOUND COMPLETE  COMPARISON:  None.  FINDINGS: Right Kidney:  Length: 11.7 cm.  Mildly increased echogenicity.  No hydronephrosis.  Left Kidney:  Length: 12.1 cm.  Mildly increased echogenicity.  No hydronephrosis.  Bladder:  Appears normal for degree of bladder distention.  IMPRESSION: Negative for hydronephrosis. There is mildly increased renal parenchymal echogenicity consistent with medical renal disease.   Electronically Signed   By: Andreas Newport M.D.   On: 10/12/2014 06:29   Ct Hip Left Wo Contrast  10/16/2014   CLINICAL DATA:  Patient presented 10/12/2014 with shortness of breath and syncope and was found to have a non ST elevation MI. Prior history of left breast cancer post mastectomy. Acute superimposed upon chronic left hip pain. No known injuries.  EXAM: CT OF THE LEFT HIP WITHOUT CONTRAST  TECHNIQUE: Multidetector CT imaging of the left hip was performed according to the standard protocol. Multiplanar CT image reconstructions were also generated.  COMPARISON:  Left hip x-rays 10/13/2014.  FINDINGS: No evidence of acute, subacute or healed fractures. Severe narrowing of the joint space posteriorly with associated hypertrophic spurring involving the posterior column of the acetabulum. Moderate joint space narrowing elsewhere. Very small joint effusion. No evidence of osseous metastatic disease involving the visualized bones comprising the left hip.  IMPRESSION: 1. No acute or subacute osseous abnormality. 2. Moderate osteoarthritis. 3. Very small joint effusion. 4. No evidence of osseous metastatic disease.   Electronically Signed   By: Evangeline Dakin M.D.   On: 10/16/2014 14:15   Ir Fluoro Guide Cv Line Right  10/21/2014   CLINICAL DATA:  Chronic renal insufficiency, needs access for hemodialysis  EXAM: TUNNELED HEMODIALYSIS CATHETER  PLACEMENT WITH ULTRASOUND AND FLUOROSCOPIC GUIDANCE  TECHNIQUE: The procedure, risks, benefits, and alternatives were explained to the patient. Questions regarding the procedure were encouraged and answered. The patient understands and consents to the procedure. As antibiotic prophylaxis, cefazolin was ordered pre-procedure and administered intravenously within one hour of incision.Patency of the right IJ vein was confirmed with ultrasound with image documentation. An appropriate skin site was determined. Region was prepped using maximum barrier technique including cap and mask, sterile gown, sterile gloves, large sterile sheet, and Chlorhexidine  as cutaneous antisepsis. The region was infiltrated locally with 1% lidocaine.  Intravenous Fentanyl and Versed were administered as conscious sedation during continuous cardiorespiratory monitoring by the radiology RN, with a total moderate sedation time of 6 minutes.  Under real-time ultrasound guidance, the right IJ vein was accessed with a 21 gauge micropuncture needle; the needle tip within the vein was confirmed with ultrasound image documentation. Needle exchanged over the 018 guidewire for transitional dilator, which allowed advancement of a Benson wire into the IVC. Over this, an MPA catheter was advanced. A Hemosplit 19 hemodialysis catheter was tunneled from the right anterior chest wall approach to the right IJ dermatotomy site. The MPA catheter was exchanged over an Amplatz wire for serial vascular dilators which allow placement of a peel-away sheath, through which the catheter was advanced under intermittent fluoroscopy, positioned with its tips in the proximal and midright atrium. Spot chest radiograph confirms good catheter position. No pneumothorax. Catheter was flushed and primed per protocol. Catheter secured externally with O Prolene sutures. The right IJ dermatotomy site was closed with Dermabond.  COMPLICATIONS: COMPLICATIONS None immediate   FLUOROSCOPY TIME:  30 seconds, 22 mGy  COMPARISON:  None  IMPRESSION: 1. Technically successful placement of tunneled right IJ hemodialysis catheter with ultrasound and fluoroscopic guidance. Ready for routine use.  ACCESS: Remains approachable for percutaneous intervention as needed.   Electronically Signed   By: Lucrezia Europe M.D.   On: 10/21/2014 11:15   Ir US Guide Vasc Access Right  10/21/2014   CLINICAL DATA:  Chronic renal insufficiency, needs access for hemodialysis  EXAM: TUNNELED HEMODIALYSIS CATHETER PLACEMENT WITH ULTRASOUND AND FLUOROSCOPIC GUIDANCE  TECHNIQUE: The procedure, risks, benefits, and alternatives were explained to the patient. Questions regarding the procedure were encouraged and answered. The patient understands and consents to the procedure. As antibiotic prophylaxis, cefazolin was ordered pre-procedure and administered intravenously within one hour of incision.Patency of the right IJ vein was confirmed with ultrasound with image documentation. An appropriate skin site was determined. Region was prepped using maximum barrier technique including cap and mask, sterile gown, sterile gloves, large sterile sheet, and Chlorhexidine as cutaneous antisepsis. The region was infiltrated locally with 1% lidocaine.  Intravenous Fentanyl and Versed were administered as conscious sedation during continuous cardiorespiratory monitoring by the radiology RN, with a total moderate sedation time of 6 minutes.  Under real-time ultrasound guidance, the right IJ vein was accessed with a 21 gauge micropuncture needle; the needle tip within the vein was confirmed with ultrasound image documentation. Needle exchanged over the 018 guidewire for transitional dilator, which allowed advancement of a Benson wire into the IVC. Over this, an MPA catheter was advanced. A Hemosplit 19 hemodialysis catheter was tunneled from the right anterior chest wall approach to the right IJ dermatotomy site. The MPA catheter was  exchanged over an Amplatz wire for serial vascular dilators which allow placement of a peel-away sheath, through which the catheter was advanced under intermittent fluoroscopy, positioned with its tips in the proximal and midright atrium. Spot chest radiograph confirms good catheter position. No pneumothorax. Catheter was flushed and primed per protocol. Catheter secured externally with O Prolene sutures. The right IJ dermatotomy site was closed with Dermabond.  COMPLICATIONS: COMPLICATIONS None immediate  FLUOROSCOPY TIME:  30 seconds, 22 mGy  COMPARISON:  None  IMPRESSION: 1. Technically successful placement of tunneled right IJ hemodialysis catheter with ultrasound and fluoroscopic guidance. Ready for routine use.  ACCESS: Remains approachable for percutaneous intervention as needed.   Electronically Signed  By: Lucrezia Europe M.D.   On: 10/21/2014 11:15   Dg Chest Port 1 View  10/15/2014   CLINICAL DATA:  CHF  EXAM: PORTABLE CHEST - 1 VIEW  COMPARISON:  10/12/2014  FINDINGS: Cardiomegaly with mild perihilar edema, improved. Left lung base is obscured, likely a combination of atelectasis and small pleural effusion.  IMPRESSION: Cardiomegaly with mild perihilar edema, improved.  Suspected small left pleural effusion.   Electronically Signed   By: Julian Hy M.D.   On: 10/15/2014 08:53   Dg Chest Portable 1 View  10/12/2014   CLINICAL DATA:  Respiratory distress  EXAM: PORTABLE CHEST - 1 VIEW  COMPARISON:  None.  FINDINGS: Central and basilar airspace opacities are present bilaterally. There is cardiomegaly. There probably is a small left pleural effusion.  IMPRESSION: Extensive airspace opacities and central and basilar distribution. This may represent alveolar edema associated with congestive heart failure. Probable left effusion. Cannot exclude infectious infiltrates.   Electronically Signed   By: Andreas Newport M.D.   On: 10/12/2014 01:51   Dg Hip Unilat With Pelvis 2-3 Views Left  10/13/2014    CLINICAL DATA:  Left hip pain.  EXAM: LEFT HIP (WITH PELVIS) 2-3 VIEWS  COMPARISON:  None.  FINDINGS: The left hip is located. No acute bone or soft tissue abnormality is present. Degenerative changes are noted in the SI joints bilaterally and lower lumbar spine.  IMPRESSION: 1. Normal radiographic appearance of the left hip. 2. Degenerative changes within the lower lumbar spine and SI joints.   Electronically Signed   By: San Morelle M.D.   On: 10/13/2014 09:48    CBC  Recent Labs Lab 10/21/14 0340 10/21/14 2315 10/22/14 1530 10/23/14 0844 10/25/14 0829  WBC 8.6 9.1 10.0 11.6* 12.1*  HGB 7.5* 7.8* 7.9* 7.4* 7.2*  HCT 25.1* 25.2* 26.0* 24.5* 24.1*  PLT 195 225 216 199 205  MCV 84.5 85.1 84.4 85.7 86.1  MCH 25.3* 26.4 25.6* 25.9* 25.7*  MCHC 29.9* 31.0 30.4 30.2 29.9*  RDW 14.8 15.0 15.1 15.1 15.2  LYMPHSABS  --  1.2  --   --  1.2  MONOABS  --  1.0  --   --  0.9  EOSABS  --  0.1  --   --  0.2  BASOSABS  --  0.0  --   --  0.0    Chemistries   Recent Labs Lab 10/21/14 2315 10/22/14 1530 10/23/14 0844 10/25/14 0830 10/26/14 0307  NA 138 138 134* 134* 136  K 4.5 3.9 4.2 4.2 4.4  CL 99* 97* 97* 98* 99*  CO2 30 30 30 28 29   GLUCOSE 141* 114* 212* 134* 122*  BUN 96* 57* 33* 37* 26*  CREATININE 4.93* 3.64* 3.35* 5.53* 4.51*  CALCIUM 7.1* 7.7* 7.6* 8.0* 7.9*   ------------------------------------------------------------------------------------------------------------------ estimated creatinine clearance is 16.1 mL/min (by C-G formula based on Cr of 4.51). ------------------------------------------------------------------------------------------------------------------ No results for input(s): HGBA1C in the last 72 hours. ------------------------------------------------------------------------------------------------------------------ No results for input(s): CHOL, HDL, LDLCALC, TRIG, CHOLHDL, LDLDIRECT in the last 72  hours. ------------------------------------------------------------------------------------------------------------------ No results for input(s): TSH, T4TOTAL, T3FREE, THYROIDAB in the last 72 hours.  Invalid input(s): FREET3 ------------------------------------------------------------------------------------------------------------------ No results for input(s): VITAMINB12, FOLATE, FERRITIN, TIBC, IRON, RETICCTPCT in the last 72 hours.  Coagulation profile  Recent Labs Lab 10/21/14 0340  INR 1.05    No results for input(s): DDIMER in the last 72 hours.  Cardiac Enzymes No results for input(s): CKMB, TROPONINI, MYOGLOBIN in the last 168 hours.  Invalid  input(s): CK ------------------------------------------------------------------------------------------------------------------ Invalid input(s): POCBNP   Recent Labs  10/25/14 1311 10/25/14 1651 10/25/14 2035 10/26/14 0612 10/26/14 0857 10/26/14 1235  GLUCAP 131* 198* 150* 126* 110* 139*    Verneita Griffes, MD [facility] [phone]

## 2014-10-26 NOTE — Progress Notes (Signed)
[facility] Progress Note  Assessment/Plan: 1. New ESRD / acute on CKD IV / cardiorenal syndrome - had HD #4 7/11. Right BC AVF placed 7/12 Dr. Bridgett Larsson; has hx of breast Ca-related lymphedema L arm; from [place], but plans to stay here for a while; [facility] is confirmed [facility] schedule when able to be d/c- plan HD again Wed due to excess volume 2. NSTEMI demand ischemia, heart cath neg for sig CAD 3. Vol excess - still vol overloaded, high LVEDP on 7/5 per heart cath , was 142kg that day so dry wt should be mid 130's most likely or lower; Net UF 3977 7/11 with post weight down to 136.8 7/11; 4. CHF - echo showed EF 30% but heart cath showed "normal cardiac output" 5. Anemia on 60/wk darbe , tsat 27%. Will ^darbe 100/wk, Hb 7's- tsat ok but given low Hgb will dose IV Fe x 5 6. HTN bp's soft on hydral/ coreg/ isordil > med decreased; bp still soft today -  Wrote to hold hydralazine for systolic BP < [id number]; follow BP trends 7. MBD -needs iPTH - P ok  [name], PA-C [facility] Beeper [phone] 10/26/2014,2:55 PM  LOS: 14 days   Pt seen, examined and agree w A/P as above. HD tomorrow then could be ready for dc to start at OP center on Thursday.  Kelly Splinter MD pager [phone]    cell [phone] 10/26/2014, 4:01 PM    Subjective:   Could walk without assistance before getting ill. Hungry hasn't eaten since last night; just returned from surgery  Objective Filed Vitals:   10/26/14 1401 10/26/14 1402 10/26/14 1415 10/26/14 1419  BP:  112/45  105/53  Pulse: 60  58 59  Temp:      TempSrc:      Resp: 14  15 15   Height:      Weight:      SpO2: 99%  99% 99%   Physical Exam General: obese supine in bed Heart: RRR 2/6 murmur Lungs: grossly clear Abdomen: obese soft Extremities: 1+ LE edema Dialysis Access: right IJ and right upper AVF + bruit  Dialysis Orders:  Additional Objective Labs: Basic Metabolic Panel:  Recent Labs Lab  10/22/14 1530 10/23/14 0844 10/25/14 0830 10/26/14 0307  NA 138 134* 134* 136  K 3.9 4.2 4.2 4.4  CL 97* 97* 98* 99*  CO2 30 30 28 29   GLUCOSE 114* 212* 134* 122*  BUN 57* 33* 37* 26*  CREATININE 3.64* 3.35* 5.53* 4.51*  CALCIUM 7.7* 7.6* 8.0* 7.9*  PHOS 4.3 3.8 4.7*  --    Liver Function Tests:  Recent Labs Lab 10/22/14 1530 10/23/14 0844 10/25/14 0830  ALBUMIN 2.8* 2.6* 2.7*   CBC:  Recent Labs Lab 10/21/14 0340 10/21/14 2315 10/22/14 1530 10/23/14 0844 10/25/14 0829  WBC 8.6 9.1 10.0 11.6* 12.1*  NEUTROABS  --  6.8  --   --  9.8*  HGB 7.5* 7.8* 7.9* 7.4* 7.2*  HCT 25.1* 25.2* 26.0* 24.5* 24.1*  MCV 84.5 85.1 84.4 85.7 86.1  PLT 195 225 216 199 205  CBG:  Recent Labs Lab 10/25/14 1651 10/25/14 2035 10/26/14 0612 10/26/14 0857 10/26/14 1235  GLUCAP 198* 150* 126* 110* 139*  Medications: . sodium chloride 10 mL/hr at 10/26/14 0909   . antiseptic oral rinse  7 mL Mouth Rinse BID  . aspirin EC  81 mg Oral Daily  . atorvastatin  40 mg Oral q1800  . carvedilol  6.25 mg Oral BID WC  . [START ON 10/27/2014] darbepoetin (ARANESP) injection - NON-DIALYSIS  100 mcg Subcutaneous Q Wed-1800  . docusate sodium  100 mg Oral BID  . gabapentin  300 mg Oral QHS  . heparin  5,000 Units Subcutaneous 3 times per day  . hydrALAZINE  25 mg Oral 3 times per day  . insulin aspart  0-15 Units Subcutaneous TID WC  . insulin aspart  0-5 Units Subcutaneous QHS  . insulin aspart  10 Units Subcutaneous TID WC  . insulin detemir  60 Units Subcutaneous Daily  . isosorbide mononitrate  60 mg Oral Daily  . lidocaine  1 patch Transdermal Q24H  . multivitamin with minerals  1 tablet Oral Daily  . pantoprazole  40 mg Oral Daily  . sodium chloride  3 mL Intravenous Q12H

## 2014-10-26 NOTE — Progress Notes (Signed)
Saturations on RA while resting 84% placed back on 2L sats 98%

## 2014-10-26 NOTE — Progress Notes (Signed)
OT Cancellation Note  Patient Details Name: Jordan [name] MRN: [id number] DOB: [birth year]   Cancelled Treatment:    Reason Eval/Treat Not Completed: Patient at procedure or test/ unavailable (Pt in surgery.  Will reattempt.)  Malka So 10/26/2014, 9:25 AM

## 2014-10-26 NOTE — Op Note (Signed)
OPERATIVE NOTE   PROCEDURE: right brachiocephalic arteriovenous fistula placement  PRE-OPERATIVE DIAGNOSIS: end stage renal disease   POST-OPERATIVE DIAGNOSIS: same as above   SURGEON: Adele Barthel, MD  ASSISTANT(S): Silva Bandy, PAC   ANESTHESIA: general  ESTIMATED BLOOD LOSS: 50 cc  FINDING(S): 1.  Prior needle injury in cephalic vein 2.  Palpable thrill at end of case 3.  Dopplerable right radial signal which did not augment with compression of venous outflow  SPECIMEN(S):  none  INDICATIONS:   Jordan [name] is a 74 y.o. female who presents with end stage renal disease.  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  Subsequently, I recommended: right brachiocephalic arteriovenous fistula placement.  The patient is aware the risks include but are not limited to: bleeding, infection, steal syndrome, nerve damage, ischemic monomelic neuropathy, failure to mature, and need for additional procedures.  The patient is aware of the risks of the procedure and elects to proceed forward.  DESCRIPTION: After full informed written consent was obtained from the patient, the patient was brought back to the operating room and placed supine upon the operating table.  Prior to induction, the patient received IV antibiotics.   After obtaining adequate anesthesia, the patient was then prepped and draped in the standard fashion for a right arm access procedure.  I turned my attention first to identifying the patient's cephalic vein and brachial artery.  Using SonoSite guidance, the location of these vessels were marked out on the skin.   I made a transverse incision at the level of the antecubitum and dissected through the subcutaneous tissue and fascia to gain exposure of the brachial artery.  This was noted to be 4 mm in diameter externally.  This was dissected out proximally and distally and controlled with vessel loops .  I then  dissected out the cephalic vein.  This was noted to be 4 mm in diameter externally.  The distal segment of the vein was ligated with a  2-0 silk, and the vein was transected.  The proximal segment was interrogated with serial dilators.  The vein accepted up to a 4 mm dilator without any difficulty.  I then instilled the heparinized saline into the vein and clamped it.  At this point, I reset my exposure of the brachial artery and placed the artery under tension proximally and distally.  I made an arteriotomy with a #11 blade, and then I extended the arteriotomy with a Potts scissor.  I injected heparinized saline proximal and distal to this arteriotomy.  The vein was then sewn to the artery in an end-to-side configuration with a running stitch of 7-0 Prolene.  Prior to completing this anastomosis, I allowed the vein and artery to backbleed.  There was no evidence of clot from any vessels.  I completed the anastomosis in the usual fashion and then released all vessel loops and clamps.  There was a palpable thrill in the venous outflow, and there was a dopplerable radial signal that did not augment with venous outflow compression.  At this point, I irrigated out the surgical wound.  There was no further active bleeding.  The subcutaneous tissue was reapproximated with a running stitch of 3-0 Vicryl.  The skin was then reapproximated with a running subcuticular stitch of 4-0 Vicryl.  The skin was then cleaned, dried, and reinforced with Dermabond.  The patient tolerated this procedure well.   COMPLICATIONS: none  CONDITION: stable  Adele Barthel, MD [facility] Office: [phone] Pager: [phone]  10/26/2014, 11:53 AM

## 2014-10-26 NOTE — Interval H&P Note (Signed)
[facility]  History and Physical Update  The patient was interviewed and re-examined.  The patient's previous History and Physical has been reviewed and is unchanged from my consult except for: interval stabilization of cardiac status and improved fluid status with serial dialysis.  There is no change in the plan of care: right arm arteriovenous fistula placement.  Adele Barthel, MD [facility] Office: [phone] Pager: [phone]  10/26/2014, 9:48 AM

## 2014-10-26 NOTE — Transfer of Care (Signed)
Immediate Anesthesia Transfer of Care Note  Patient: Jordan [name]  Procedure(s) Performed: Procedure(s): Right Brachiocephalic Arteriovenous FISTULA CREATION (Right)  Patient Location: PACU  Anesthesia Type:General  Level of Consciousness: awake, alert , oriented and patient cooperative  Airway & Oxygen Therapy: Patient Spontanous Breathing and Patient connected to face mask oxygen  Post-op Assessment: Report given to RN, Post -op Vital signs reviewed and stable, Patient moving all extremities and Patient moving all extremities X 4  Post vital signs: Reviewed and stable  Last Vitals:  Filed Vitals:   10/26/14 0526  BP: 132/45  Pulse: 67  Temp: 36.7 C  Resp: 18    Complications: No apparent anesthesia complications

## 2014-10-26 NOTE — Anesthesia Procedure Notes (Signed)
Procedure Name: Intubation Date/Time: 10/26/2014 10:44 AM Performed by: Shirlyn Goltz Pre-anesthesia Checklist: Patient identified, Emergency Drugs available, Suction available and Patient being monitored Patient Re-evaluated:Patient Re-evaluated prior to inductionOxygen Delivery Method: Circle system utilized Preoxygenation: Pre-oxygenation with 100% oxygen Intubation Type: IV induction Ventilation: Mask ventilation without difficulty and Oral airway inserted - appropriate to patient size Laryngoscope Size: Mac and 3 Grade View: Grade I Tube type: Oral Tube size: 7.0 mm Number of attempts: 1 Airway Equipment and Method: Stylet Placement Confirmation: ETT inserted through vocal cords under direct vision,  positive ETCO2 and breath sounds checked- equal and bilateral Secured at: 22 cm Tube secured with: Tape Dental Injury: Teeth and Oropharynx as per pre-operative assessment

## 2014-10-26 NOTE — Anesthesia Preprocedure Evaluation (Addendum)
Anesthesia Evaluation  Patient identified by MRN, date of birth, ID band Patient awake    Reviewed: Allergy & Precautions, NPO status , Patient's Chart, lab work & pertinent test results  History of Anesthesia Complications Negative for: history of anesthetic complications  Airway Mallampati: II  TM Distance: >3 FB Neck ROM: Full    Dental  (+) Poor Dentition, Chipped, Missing, Loose, Dental Advisory Given   Pulmonary shortness of breath, COPD COPD inhaler and oxygen dependent,  breath sounds clear to auscultation        Cardiovascular hypertension, Pt. on medications - angina+ Past MI (NSTEMI 6/28) and +CHF Rhythm:Regular Rate:Normal  10/20/14/ cath: Mild nonobstructive CAD, Severe pulmonary HTN, Elevated LV filling pressures, Normal cardiac output  10/12/14 ECHO: EF of 30%, akinesis of inferior septum, akinesis of inferior wall, hypokinesis of anterior septum and akinesis of anterior wall   Neuro/Psych negative neurological ROS     GI/Hepatic Neg liver ROS, GERD-  Medicated and Controlled,  Endo/Other  diabetes (glu 110), Insulin DependentMorbid obesity  Renal/GU ESRFRenal disease (K+ 4.4)     Musculoskeletal  (+) Arthritis -, Osteoarthritis,    Abdominal (+) + obese,   Peds  Hematology  (+) Blood dyscrasia (Hb 7.2), ,   Anesthesia Other Findings Breast cancer L  Reproductive/Obstetrics                          Anesthesia Physical Anesthesia Plan  ASA: IV  Anesthesia Plan: General   Post-op Pain Management:    Induction: Intravenous  Airway Management Planned: LMA  Additional Equipment:   Intra-op Plan:   Post-operative Plan:   Informed Consent: I have reviewed the patients History and Physical, chart, labs and discussed the procedure including the risks, benefits and alternatives for the proposed anesthesia with the patient or authorized representative who has indicated his/her  understanding and acceptance.   Dental advisory given  Plan Discussed with: CRNA and Surgeon  Anesthesia Plan Comments: (Plan routine monitors, GA- LMA OK)        Anesthesia Quick Evaluation

## 2014-10-26 NOTE — H&P (View-Only) (Signed)
Referred by:  Dr. Posey Pronto (Nephrology)  Reason for referral: New access  History of Present Illness  Jordan [name] is a 74 y.o. (04-04-1941) female who presents for evaluation for permanent access.  The patient is right hand dominant.  The patient has not had previous access procedures.  Previous central venous cannulation procedures include: RIJV TDC.  The patient has never had a PPM placed.  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  Past Medical History  Diagnosis Date  . Breast cancer   . Diabetes mellitus without complication   . Hypertension   . Arthritis   . COPD (chronic obstructive pulmonary disease)   . Renal disorder     Family reports acute renal failure  . GERD (gastroesophageal reflux disease)   . CKD (chronic kidney disease)     Past Surgical History  Procedure Laterality Date  . Breast surgery Left   . Cardiac catheterization [name] 10/19/2014    Procedure: Right/Left Heart Cath and Coronary Angiography;  Surgeon: Peter M Martinique, MD;  Location: [facility];  Service: Cardiovascular;  Laterality: [name];    History   Social History  . Marital Status: Single    Spouse Name: [name]  . Number of Children: [name]  . Years of Education: [name]   Occupational History  . Not on file.   Social History Main Topics  . Smoking status: Never Smoker   . Smokeless tobacco: Not on file  . Alcohol Use: No  . Drug Use: Not on file  . Sexual Activity: Not on file   Other Topics Concern  . Not on file   Social History Narrative    Family History  Problem Relation Age of Onset  . Diabetes Mother   . Hypertension Mother   . Stroke Sister   . Hypertension Sister     Current Facility-Administered Medications  Medication Dose Route Frequency Provider Last Rate Last Dose  . 0.9 %  sodium chloride infusion  250 mL Intravenous PRN Ivor Costa, MD      . 0.9 %  sodium chloride  infusion  250 mL Intravenous PRN Peter M Martinique, MD      . 0.9 %  sodium chloride infusion  250 mL Intravenous PRN Peter M Martinique, MD      . acetaminophen (TYLENOL) tablet 650 mg  650 mg Oral Q4H PRN Ivor Costa, MD      . albuterol (PROVENTIL) (2.5 MG/3ML) 0.083% nebulizer solution 5 mg  5 mg Nebulization Q2H PRN Ivor Costa, MD      . antiseptic oral rinse (CPC / CETYLPYRIDINIUM CHLORIDE 0.05%) solution 7 mL  7 mL Mouth Rinse BID Geradine Girt, DO   7 mL at 10/20/14 2141  . aspirin EC tablet 81 mg  81 mg Oral Daily Ivor Costa, MD   81 mg at 10/21/14 1013  . atorvastatin (LIPITOR) tablet 40 mg  40 mg Oral q1800 Peter M Martinique, MD   40 mg at 10/20/14 1721  . capsaicin (ZOSTRIX) Q000111Q % cream 1 application  1 application Topical BID PRN Ivor Costa, MD      . carvedilol (COREG) tablet 25 mg  25 mg Oral BID WC Ripudeep Krystal Eaton, MD   25 mg at 10/20/14 1721  . ceFAZolin (ANCEF) 2-3 GM-% IVPB SOLR           . Darbepoetin Alfa (ARANESP) injection 60 mcg  60 mcg Subcutaneous Q Wed-1800 Elmarie Shiley, MD   60 mcg at 10/20/14 1721  . docusate sodium (COLACE) capsule 100 mg  100 mg Oral BID Ripudeep K Rai, MD   100 mg at 10/21/14 1013  . fentaNYL (SUBLIMAZE) 100 MCG/2ML injection           . furosemide (LASIX) injection 80 mg  80 mg Intravenous Q12H Peter M Martinique, MD   80 mg at 10/20/14 2359  . gabapentin (NEURONTIN) capsule 300 mg  300 mg Oral BID Nita Sells, MD   300 mg at 10/21/14 1013  . guaiFENesin-codeine 100-10 MG/5ML solution 10 mL  10 mL Oral Q6H PRN Ivor Costa, MD   10 mL at 10/14/14 1749  . heparin 1000 UNIT/ML injection           . heparin injection 5,000 Units  5,000 Units Subcutaneous 3 times per day Monia Sabal, PA-C   Stopped at 10/21/14 0600  . hydrALAZINE (APRESOLINE) tablet 100 mg  100 mg Oral 3 times per day Ripudeep Krystal Eaton, MD   100 mg at 10/21/14 0601  . insulin aspart (novoLOG) injection 0-15 Units  0-15 Units Subcutaneous TID WC Geradine Girt, DO   2 Units at 10/21/14 0601  . insulin  aspart (novoLOG) injection 0-5 Units  0-5 Units Subcutaneous QHS Geradine Girt, DO   2 Units at 10/18/14 2154  . insulin aspart (novoLOG) injection 10 Units  10 Units Subcutaneous TID WC Ripudeep Krystal Eaton, MD   10 Units at 10/20/14 1721  . insulin detemir (LEVEMIR) injection 60 Units  60 Units Subcutaneous Daily Ripudeep Krystal Eaton, MD   60 Units at 10/21/14 1137  . ipratropium-albuterol (DUONEB) 0.5-2.5 (3) MG/3ML nebulizer solution 3 mL  3 mL Nebulization Q6H PRN John P Day, RRT      . isosorbide mononitrate (IMDUR) 24 hr tablet 120 mg  120 mg Oral Daily Mihai Croitoru, MD   120 mg at 10/20/14 1005  . lidocaine (LIDODERM) 5 % 1 patch  1 patch Transdermal Q24H Geradine Girt, DO   1 patch at 10/21/14 1137  . lidocaine (XYLOCAINE) 1 % (with pres) injection           . midazolam (VERSED) 2 MG/2ML injection           . multivitamin with minerals tablet 1 tablet  1 tablet Oral Daily Ivor Costa, MD   1 tablet at 10/21/14 1013  . ondansetron (ZOFRAN) injection 4 mg  4 mg Intravenous Q6H PRN Ivor Costa, MD   4 mg at 10/15/14 2225  . oxyCODONE (Oxy IR/ROXICODONE) immediate release tablet 5 mg  5 mg Oral Q4H PRN Ripudeep Krystal Eaton, MD   5 mg at 10/19/14 0509  . pantoprazole (PROTONIX) EC tablet 40 mg  40 mg Oral Daily Ivor Costa, MD   40 mg at 10/21/14 1013  . polyethylene glycol (MIRALAX / GLYCOLAX) packet 17 g  17 g Oral Daily PRN Ripudeep K Rai, MD      . sodium chloride 0.9 % injection 3 mL  3 mL Intravenous Q12H Ivor Costa, MD   3 mL at 10/21/14 1000  . sodium chloride 0.9 % injection 3 mL  3 mL Intravenous PRN Ivor Costa, MD   3 mL at 10/12/14 1233  . sodium chloride 0.9 % injection 3 mL  3 mL Intravenous Q12H Peter M Martinique, MD   3 mL at 10/20/14 1007  . sodium chloride 0.9 % injection 3 mL  3 mL  Intravenous PRN Peter M Martinique, MD      . sodium chloride 0.9 % injection 3 mL  3 mL Intravenous Q12H Peter M Martinique, MD   3 mL at 10/21/14 1000  . sodium chloride 0.9 % injection 3 mL  3 mL Intravenous PRN Peter M Martinique,  MD        No Known Allergies   REVIEW OF SYSTEMS:  (Positives checked otherwise negative)  CARDIOVASCULAR:   [ ]  chest pain,  [ ]  chest pressure,  [ ]  palpitations,  [x]  shortness of breath when laying flat,  [x]  shortness of breath with exertion,   [ ]  pain in feet when walking,  [ ]  pain in feet when laying flat, [ ]  history of blood clot in veins (DVT),  [ ]  history of phlebitis,  [x]  swelling in legs,  [ ]  varicose veins  PULMONARY:   [ ]  productive cough,  [ ]  asthma,  [ ]  wheezing  NEUROLOGIC:   [ ]  weakness in arms or legs,  [ ]  numbness in arms or legs,  [ ]  difficulty speaking or slurred speech,  [ ]  temporary loss of vision in one eye,  [ ]  dizziness  HEMATOLOGIC:   [ ]  bleeding problems,  [ ]  problems with blood clotting too easily  MUSCULOSKEL:   [ ]  joint pain, [ ]  joint swelling  GASTROINTEST:   [ ]  vomiting blood,  [ ]  blood in stool     GENITOURINARY:   [ ]  burning with urination,  [ ]  blood in urine  PSYCHIATRIC:   [ ]  history of major depression  INTEGUMENTARY:   [x]  rashes,  [ ]  ulcers  CONSTITUTIONAL:   [ ]  fever,  [ ]  chills   Physical Examination  Filed Vitals:   10/21/14 1044 10/21/14 1052 10/21/14 1054 10/21/14 1100  BP: 180/74 167/75 167/98 163/83  Pulse: 81 79 82 88  Temp:      TempSrc:      Resp: 20 20 23 20   Height:      Weight:      SpO2: 98% 93% 96% 93%   Body mass index is 54.32 kg/(m^2).  General: A&O x 3, WD, morbidly obese  Head: [place]/AT  Ear/Nose/Throat: Hearing grossly intact, nares w/o erythema or drainage, oropharynx w/o Erythema/Exudate, Mallampati score: 3  Eyes: PERRLA, EOMI  Neck: Supple, no nuchal rigidity, no palpable LAD  Pulmonary: Sym exp, distal BS, CTAB, no rales, rhonchi, & wheezing  Cardiac: RRR, Nl S1, S2, no Murmurs, rubs or gallops  Vascular: Vessel Right Left  Radial Palpable Palpable  Ulnar Not Palpable Not Palpable  Brachial Palpable Palpable  Carotid Palpable, without  bruit Palpable, without bruit  Aorta Not palpable due to obesity [name]  Femoral Not Palpable due to pannus Not Palpable due to pannus  Popliteal Not palpable Not palpable  PT Faintly Palpable Faintly Palpable  DP Palpable Palpable   Gastrointestinal: soft, NTND, -G/R, - HSM, - masses, - CVAT B, large pannus  Musculoskeletal: M/S 5/5 throughout , Extremities without ischemic changes , multiple IV in R forearm, Left entire arm swollen 1-2+, R>>L chronic venous stasis skin changes, BLE edema 1-2+  Neurologic: CN 2-12 intact , Pain and light touch intact in extremities . Motor exam as listed above  Psychiatric: Judgment intact, Mood & affect appropriate for pt's clinical situation  Dermatologic: See M/S exam for extremity exam, no rashes otherwise noted  Lymph : No Cervical, Axillary, or Inguinal lymphadenopathy    Non-Invasive Vascular  Imaging  Vein Mapping  (Date: 10/20/14):   R arm: acceptable vein conduits include Cephalic, basilic not visualized  L arm: acceptable vein conduits include cephalic, basilic not visualized  Radiology: Ir Fluoro Guide Cv Line Right  10/21/2014   CLINICAL DATA:  Chronic renal insufficiency, needs access for hemodialysis  EXAM: TUNNELED HEMODIALYSIS CATHETER PLACEMENT WITH ULTRASOUND AND FLUOROSCOPIC GUIDANCE  TECHNIQUE: The procedure, risks, benefits, and alternatives were explained to the patient. Questions regarding the procedure were encouraged and answered. The patient understands and consents to the procedure. As antibiotic prophylaxis, cefazolin was ordered pre-procedure and administered intravenously within one hour of incision.Patency of the right IJ vein was confirmed with ultrasound with image documentation. An appropriate skin site was determined. Region was prepped using maximum barrier technique including cap and mask, sterile gown, sterile gloves, large sterile sheet, and Chlorhexidine as cutaneous antisepsis. The region was infiltrated locally with  1% lidocaine.  Intravenous Fentanyl and Versed were administered as conscious sedation during continuous cardiorespiratory monitoring by the radiology RN, with a total moderate sedation time of 6 minutes.  Under real-time ultrasound guidance, the right IJ vein was accessed with a 21 gauge micropuncture needle; the needle tip within the vein was confirmed with ultrasound image documentation. Needle exchanged over the 018 guidewire for transitional dilator, which allowed advancement of a Benson wire into the IVC. Over this, an MPA catheter was advanced. A Hemosplit 19 hemodialysis catheter was tunneled from the right anterior chest wall approach to the right IJ dermatotomy site. The MPA catheter was exchanged over an Amplatz wire for serial vascular dilators which allow placement of a peel-away sheath, through which the catheter was advanced under intermittent fluoroscopy, positioned with its tips in the proximal and midright atrium. Spot chest radiograph confirms good catheter position. No pneumothorax. Catheter was flushed and primed per protocol. Catheter secured externally with O Prolene sutures. The right IJ dermatotomy site was closed with Dermabond.  COMPLICATIONS: COMPLICATIONS None immediate  FLUOROSCOPY TIME:  30 seconds, 22 mGy  COMPARISON:  None  IMPRESSION: 1. Technically successful placement of tunneled right IJ hemodialysis catheter with ultrasound and fluoroscopic guidance. Ready for routine use.  ACCESS: Remains approachable for percutaneous intervention as needed.   Electronically Signed   By: Lucrezia Europe M.D.   On: 10/21/2014 11:15   Ir US Guide Vasc Access Right  10/21/2014   CLINICAL DATA:  Chronic renal insufficiency, needs access for hemodialysis  EXAM: TUNNELED HEMODIALYSIS CATHETER PLACEMENT WITH ULTRASOUND AND FLUOROSCOPIC GUIDANCE  TECHNIQUE: The procedure, risks, benefits, and alternatives were explained to the patient. Questions regarding the procedure were encouraged and answered. The  patient understands and consents to the procedure. As antibiotic prophylaxis, cefazolin was ordered pre-procedure and administered intravenously within one hour of incision.Patency of the right IJ vein was confirmed with ultrasound with image documentation. An appropriate skin site was determined. Region was prepped using maximum barrier technique including cap and mask, sterile gown, sterile gloves, large sterile sheet, and Chlorhexidine as cutaneous antisepsis. The region was infiltrated locally with 1% lidocaine.  Intravenous Fentanyl and Versed were administered as conscious sedation during continuous cardiorespiratory monitoring by the radiology RN, with a total moderate sedation time of 6 minutes.  Under real-time ultrasound guidance, the right IJ vein was accessed with a 21 gauge micropuncture needle; the needle tip within the vein was confirmed with ultrasound image documentation. Needle exchanged over the 018 guidewire for transitional dilator, which allowed advancement of a Benson wire into the IVC. Over this, an MPA catheter  was advanced. A Hemosplit 19 hemodialysis catheter was tunneled from the right anterior chest wall approach to the right IJ dermatotomy site. The MPA catheter was exchanged over an Amplatz wire for serial vascular dilators which allow placement of a peel-away sheath, through which the catheter was advanced under intermittent fluoroscopy, positioned with its tips in the proximal and midright atrium. Spot chest radiograph confirms good catheter position. No pneumothorax. Catheter was flushed and primed per protocol. Catheter secured externally with O Prolene sutures. The right IJ dermatotomy site was closed with Dermabond.  COMPLICATIONS: COMPLICATIONS None immediate  FLUOROSCOPY TIME:  30 seconds, 22 mGy  COMPARISON:  None  IMPRESSION: 1. Technically successful placement of tunneled right IJ hemodialysis catheter with ultrasound and fluoroscopic guidance. Ready for routine use.   ACCESS: Remains approachable for percutaneous intervention as needed.   Electronically Signed   By: Lucrezia Europe M.D.   On: 10/21/2014 11:15   Laboratory: CBC:    Component Value Date/Time   WBC 8.6 10/21/2014 0340   RBC 2.97* 10/21/2014 0340   HGB 7.5* 10/21/2014 0340   HCT 25.1* 10/21/2014 0340   PLT 195 10/21/2014 0340   MCV 84.5 10/21/2014 0340   MCH 25.3* 10/21/2014 0340   MCHC 29.9* 10/21/2014 0340   RDW 14.8 10/21/2014 0340   LYMPHSABS 2.4 10/12/2014 0100   MONOABS 0.5 10/12/2014 0100   EOSABS 0.1 10/12/2014 0100   BASOSABS 0.0 10/12/2014 0100    BMP:    Component Value Date/Time   NA 136 10/21/2014 0500   K 4.9 10/21/2014 0500   CL 97* 10/21/2014 0500   CO2 28 10/21/2014 0500   GLUCOSE 143* 10/21/2014 0500   BUN 96* 10/21/2014 0500   CREATININE 5.27* 10/21/2014 0500   CALCIUM 7.1* 10/21/2014 0500   GFRNONAA 7* 10/21/2014 0500   GFRAA 9* 10/21/2014 0500    Coagulation: Lab Results  Component Value Date   INR 1.05 10/21/2014   INR 1.09 10/15/2014   INR 1.06 10/12/2014   No results found for: PTT  Lipids:    Component Value Date/Time   CHOL 199 10/12/2014 0745   TRIG 113 10/12/2014 0745   HDL 53 10/12/2014 0745   CHOLHDL 3.8 10/12/2014 0745   VLDL 23 10/12/2014 0745   LDLCALC 123* 10/12/2014 0745    Medical Decision Making  Lynnea Ferrier [name] is a 74 y.o. female who presents with new onset ESRD requiring hemodialysis, NSTEMI, uncontrolled DM, uncontrolled HTN, hyperlipidemia, s/p L mastectomy for breast cancer, morbid obesity   Obviously she has significant active medical problems which will complicate the timing of any permanent access placement.  Multiple consultants are already onboard to help optimize her care.  Based on vein mapping and examination, this patient's permanent access options include: R BC AVF.   Protect the R arm for access.  I would NOT place L BC AVF, as I suspect the swelling in L arm from her chronic lymphedema would  limit the ability to cannulate the fistula.  Another central venous access might need to be placed to facilitate placement of her R BC AVF.  Given the recent start of HD, I would defer permanent access placement until early next week, Tuesday at the earliest, to allow HD to help with her total body fluid overload.  I had an extensive discussion with this patient in regards to the nature of access surgery, including risk, benefits, and alternatives.    The patient is aware that the risks of access surgery include but are not  limited to: bleeding, infection, steal syndrome, nerve damage, ischemic monomelic neuropathy, failure of access to mature, and possible need for additional access procedures in the future.  The patient has agreed to proceed with the above procedure which will be scheduled Tuesday tenatively.  Adele Barthel, MD [facility] Office: [phone] Pager: [phone]  10/21/2014, 12:56 PM

## 2014-10-27 ENCOUNTER — Encounter (HOSPITAL_COMMUNITY): Payer: Self-pay | Admitting: Vascular Surgery

## 2014-10-27 ENCOUNTER — Telehealth: Payer: Self-pay | Admitting: Physician Assistant

## 2014-10-27 DIAGNOSIS — I429 Cardiomyopathy, unspecified: Secondary | ICD-10-CM

## 2014-10-27 DIAGNOSIS — Z992 Dependence on renal dialysis: Secondary | ICD-10-CM

## 2014-10-27 DIAGNOSIS — N186 End stage renal disease: Secondary | ICD-10-CM

## 2014-10-27 DIAGNOSIS — E1121 Type 2 diabetes mellitus with diabetic nephropathy: Secondary | ICD-10-CM

## 2014-10-27 LAB — CBC
HCT: 25 % — ABNORMAL LOW (ref 36.0–46.0)
Hemoglobin: 7.5 g/dL — ABNORMAL LOW (ref 12.0–15.0)
MCH: 25.9 pg — ABNORMAL LOW (ref 26.0–34.0)
MCHC: 30 g/dL (ref 30.0–36.0)
MCV: 86.2 fL (ref 78.0–100.0)
Platelets: 225 10*3/uL (ref 150–400)
RBC: 2.9 MIL/uL — ABNORMAL LOW (ref 3.87–5.11)
RDW: 15.5 % (ref 11.5–15.5)
WBC: 12.7 10*3/uL — ABNORMAL HIGH (ref 4.0–10.5)

## 2014-10-27 LAB — RENAL FUNCTION PANEL
Albumin: 2.6 g/dL — ABNORMAL LOW (ref 3.5–5.0)
Anion gap: 8 (ref 5–15)
BUN: 42 mg/dL — ABNORMAL HIGH (ref 6–20)
CO2: 28 mmol/L (ref 22–32)
Calcium: 8 mg/dL — ABNORMAL LOW (ref 8.9–10.3)
Chloride: 98 mmol/L — ABNORMAL LOW (ref 101–111)
Creatinine, Ser: 6.05 mg/dL — ABNORMAL HIGH (ref 0.44–1.00)
GFR calc Af Amer: 7 mL/min — ABNORMAL LOW (ref >60)
GFR calc non Af Amer: 6 mL/min — ABNORMAL LOW (ref >60)
Glucose, Bld: 183 mg/dL — ABNORMAL HIGH (ref 65–99)
Phosphorus: 5 mg/dL — ABNORMAL HIGH (ref 2.5–4.6)
Potassium: 4.8 mmol/L (ref 3.5–5.1)
Sodium: 134 mmol/L — ABNORMAL LOW (ref 135–145)

## 2014-10-27 LAB — GLUCOSE, CAPILLARY
GLUCOSE-CAPILLARY: 182 mg/dL — AB (ref 65–99)
GLUCOSE-CAPILLARY: 182 mg/dL — AB (ref 65–99)
Glucose-Capillary: 157 mg/dL — ABNORMAL HIGH (ref 65–99)

## 2014-10-27 MED ORDER — LIDOCAINE HCL (PF) 1 % IJ SOLN: 5.0000 mL | INTRAMUSCULAR | Status: DC | PRN

## 2014-10-27 MED ORDER — SODIUM CHLORIDE 0.9 % IV SOLN: 100.0000 mL | INTRAVENOUS | Status: DC | PRN

## 2014-10-27 MED ORDER — ALTEPLASE 2 MG IJ SOLR
2.0000 mg | Freq: Once | INTRAMUSCULAR | Status: DC | PRN
  Filled 2014-10-27: qty 2

## 2014-10-27 MED ORDER — CARVEDILOL 6.25 MG PO TABS: 6.2500 mg | ORAL_TABLET | Freq: Two times a day (BID) | ORAL | Status: DC

## 2014-10-27 MED ORDER — PENTAFLUOROPROP-TETRAFLUOROETH EX AERO: 1.0000 "application " | INHALATION_SPRAY | CUTANEOUS | Status: DC | PRN

## 2014-10-27 MED ORDER — HEPARIN SODIUM (PORCINE) 1000 UNIT/ML DIALYSIS
20.0000 [IU]/kg | INTRAMUSCULAR | Status: DC | PRN
  Filled 2014-10-27: qty 3

## 2014-10-27 MED ORDER — DARBEPOETIN ALFA 100 MCG/0.5ML IJ SOSY
PREFILLED_SYRINGE | INTRAMUSCULAR | Status: AC
  Administered 2014-10-27: 100 ug
  Filled 2014-10-27: qty 0.5

## 2014-10-27 MED ORDER — LIDOCAINE-PRILOCAINE 2.5-2.5 % EX CREA
1.0000 "application " | TOPICAL_CREAM | CUTANEOUS | Status: DC | PRN
  Filled 2014-10-27: qty 5

## 2014-10-27 MED ORDER — NEPRO/CARBSTEADY PO LIQD
237.0000 mL | ORAL | Status: DC | PRN
  Filled 2014-10-27: qty 237

## 2014-10-27 MED ORDER — MENTHOL 3 MG MT LOZG
1.0000 | LOZENGE | OROMUCOSAL | Status: DC | PRN
  Administered 2014-10-27: 3 mg via ORAL
  Filled 2014-10-27: qty 9

## 2014-10-27 MED ORDER — HEPARIN SODIUM (PORCINE) 1000 UNIT/ML DIALYSIS
1000.0000 [IU] | INTRAMUSCULAR | Status: DC | PRN
  Filled 2014-10-27: qty 1

## 2014-10-27 NOTE — Progress Notes (Signed)
OT Cancellation Note  Patient Details Name: Jordan [name] MRN: [id number] DOB: [birth year]   Cancelled Treatment:    Reason Eval/Treat Not Completed: Patient at procedure or test/ unavailable (HD)  Malka So 10/27/2014, 12:26 PM

## 2014-10-27 NOTE — Progress Notes (Signed)
[facility] Progress Note  Assessment: 1. New ESRD / acute on CKD IV / cardiorenal syndrome - had HD #4 7/11. Right BC AVF placed 7/12 Dr. Bridgett Larsson; has hx of breast Ca-related lymphedema L arm; from [place], but plans to stay here for a while; [facility] is [facility] 2. NSTEMI demand ischemia, heart cath neg for sig CAD 3. Vol excess - still vol overloaded, high LVEDP on 7/5 per heart cath , was 142kg that day so dry wt should be mid 130's most likely or lower; Net UF 3977 7/11 with post weight down to 136.8 7/11; 4. CHF - echo showed EF 30% but heart cath showed "normal cardiac output"  5. Anemia on 60/wk darbe , tsat 27%. Will ^darbe 100/wk, Hb 7's- tsat ok but given low Hgb will dose IV Fe x 5 6. HTN bp's soft on hydral/ coreg/ isordil > med decreased; bp remains lowish and dropped in HD today.  7. MBD -needs iPTH - P ok   Plan : DC isordil (no CAD by heart cath). OK for dc after HD today if stable .    Kelly Splinter MD pager [phone]    cell [phone] 10/27/2014, 12:00 PM    Subjective:   Could walk without assistance before getting ill. Hungry hasn't eaten since last night; just returned from surgery  Objective Filed Vitals:   10/27/14 1000 10/27/14 1030 10/27/14 1045 10/27/14 1100  BP: 112/41 120/49 103/41 123/40  Pulse: 67 58 67 64  Temp:      TempSrc:      Resp:      Height:      Weight:      SpO2:       Physical Exam General: obese supine in bed Heart: RRR 2/6 murmur Lungs: grossly clear Abdomen: obese soft Extremities: 1+ LE edema Dialysis Access: right IJ and right upper AVF + bruit  Dialysis Orders:  Additional Objective Labs: Basic Metabolic Panel:  Recent Labs Lab 10/23/14 0844 10/25/14 0830 10/26/14 0307 10/27/14 0924  NA 134* 134* 136 134*  K 4.2 4.2 4.4 4.8  CL 97* 98* 99* 98*  CO2 30 28 29 28   GLUCOSE 212* 134* 122* 183*  BUN 33* 37* 26* 42*  CREATININE 3.35* 5.53* 4.51* 6.05*  CALCIUM 7.6* 8.0* 7.9*  8.0*  PHOS 3.8 4.7*  --  5.0*   Liver Function Tests:  Recent Labs Lab 10/23/14 0844 10/25/14 0830 10/27/14 0924  ALBUMIN 2.6* 2.7* 2.6*   CBC:  Recent Labs Lab 10/21/14 2315 10/22/14 1530 10/23/14 0844 10/25/14 0829 10/27/14 0928  WBC 9.1 10.0 11.6* 12.1* 12.7*  NEUTROABS 6.8  --   --  9.8*  --   HGB 7.8* 7.9* 7.4* 7.2* 7.5*  HCT 25.2* 26.0* 24.5* 24.1* 25.0*  MCV 85.1 84.4 85.7 86.1 86.2  PLT 225 216 199 205 225  CBG:  Recent Labs Lab 10/26/14 1235 10/26/14 1629 10/26/14 2119 10/27/14 0548 10/27/14 1103  GLUCAP 139* 212* 186* 182* 157*  Medications: . sodium chloride 10 mL/hr at 10/26/14 0909   . antiseptic oral rinse  7 mL Mouth Rinse BID  . aspirin EC  81 mg Oral Daily  . atorvastatin  40 mg Oral q1800  . carvedilol  6.25 mg Oral BID WC  . darbepoetin (ARANESP) injection - NON-DIALYSIS  100 mcg Subcutaneous Q Wed-1800  . docusate sodium  100 mg Oral BID  . ferric gluconate (FERRLECIT/NULECIT) IV  125 mg Intravenous Q M,W,F-HD  .  gabapentin  300 mg Oral QHS  . heparin  5,000 Units Subcutaneous 3 times per day  . hydrALAZINE  25 mg Oral 3 times per day  . insulin aspart  0-15 Units Subcutaneous TID WC  . insulin aspart  0-5 Units Subcutaneous QHS  . insulin aspart  10 Units Subcutaneous TID WC  . insulin detemir  60 Units Subcutaneous Daily  . isosorbide mononitrate  60 mg Oral Daily  . lidocaine  1 patch Transdermal Q24H  . multivitamin with minerals  1 tablet Oral Daily  . pantoprazole  40 mg Oral Daily  . sodium chloride  3 mL Intravenous Q12H

## 2014-10-27 NOTE — Progress Notes (Signed)
Discharge information given to patient and family, patient verbalized understanding and questions answered.

## 2014-10-27 NOTE — Progress Notes (Signed)
PT Cancellation Note  Patient Details Name: Jordan [name] MRN: [id number] DOB: May 25, 1940   Cancelled Treatment:    Reason Eval/Treat Not Completed: Patient at procedure or test/unavailable.  Pt getting dialysis, unable to see, will check back tomorrow as able.     Denice Bors 10/27/2014, 4:57 PM

## 2014-10-27 NOTE — Progress Notes (Addendum)
  [facility] [place] Postoperative hemodialysis access   Date of Surgery:  10/26/14 Surgeon: Dr. Bridgett Larsson  Subjective:  Having some soreness with incision. Denies hand pain.   PHYSICAL EXAMINATION:  Filed Vitals:   10/27/14 0543  BP: 120/45  Pulse: 66  Temp: 98.1 F (36.7 C)  Resp: 18    Incision is clean and intact Hand grip is 5/5  Sensation in digits is intact;  There is palpable thrill and audible bruit.   ASSESSMENT/PLAN:  Jordan [name] is a 74 y.o. year old female who is s/p right brachiocephalic fistula.   -Fistula is patent -No evidence of steal symptoms -f/u with Dr. Bridgett Larsson in 6 weeks to check maturation of AVF -will sign off-call as needed.   Virgina Jock, PA-C [facility] [place] Pager: [phone]  Addendum  I have independently interviewed and examined the patient, and I agree with the physician assistant's findings.  No steal signs or sx.  Adele Barthel, MD [facility] [place] Office: [phone] Pager: [phone]  10/27/2014, 3:24 PM

## 2014-10-27 NOTE — Care Management Note (Signed)
Case Management Note  Patient Details  Name: Jordan [name] MRN: [id number] Date of Birth: 03-Jun-1940  Subjective/Objective:    Admitted with chest apin, CHF                Action/Plan: Patient is here from [place] visiting family members and plans to stay here with her daughter Elayne Guerin for 2-3 months Address of Daughter [address] [place], [place] [address] Tele # [phone] CM offered [facility] choice for the Disease Management program, patient chose [facility] for [facility] services; Sheppard Evens with [facility] called for arrangements; Patient has home 02 through [facility] and brought 2 tanks with her. CM asked pt to have her daughter bring one of her tanks from home to the hospital for discharge home possibly today. She also has a Marketing executive but needs a 3:1 - ordered from [facility] to be delivered to the room today prior to discharge home.   Expected Discharge Date:   possibly 10/27/2014               Expected Discharge Plan:  [place]  Discharge planning Services  CM Consult    Choice offered to:  Patient  DME Arranged:  3-N-1 DME Agency:  [facility] Arranged:  Disease Management, RN, PT, OT HH Agency:  [facility]  Status of Service:  In process, will continue to follow  Medicare Important Message Given:  Yes-fourth notification given:  Christy Gentles BSN [phone] 10/27/2014, 2:40 PM

## 2014-10-27 NOTE — Progress Notes (Signed)
[facility] set up per case management and notified patient being d/C to dgt home tonight. [facility] also notified  and 3 in one to be delivered to [facility] home tomorrow.

## 2014-10-27 NOTE — Discharge Summary (Signed)
Physician Discharge Summary  Jordan [name] [id number] DOB: Feb 03, 1941 DOA: 10/12/2014  PCP: Pcp Not In System  Admit date: 10/12/2014 Discharge date: 10/27/2014  Time spent: >30 minutes  Recommendations for Outpatient Follow-up:  1. BMET to follow electrolytes 2. Cardiology/renal service to continue adjusting heart medications and antihypertensive agents  3. PCP to adjust hypoglycemic regimen as needed 4. Will benefit of sleep study as an outpatient and if needed initiation of CPAP  Discharge Diagnoses:  Principal Problem:   SOB (shortness of breath) Active Problems:   Breast cancer   Diabetes mellitus without complication   Hypertension   Arthritis   COPD (chronic obstructive pulmonary disease)   Syncope   CKD (chronic kidney disease)   Acute on chronic respiratory failure   Chest pain with high risk for cardiac etiology   LBBB (left bundle branch block)   Elevated troponin   Acute systolic CHF (congestive heart failure)   NSTEMI (non-ST elevated myocardial infarction)   Pulmonary HTN   AKI (acute kidney injury)   CKD (chronic kidney disease) stage 4, GFR 15-29 ml/min   Left hip pain   Essential hypertension   Discharge Condition: stable. Discharge with instructions to follow with renal service, cardiology, vascular surgery and PCP.  Diet recommendation: heart failure, low carb diet and low sodium diet  Filed Weights   10/27/14 0543 10/27/14 0900 10/27/14 1318  Weight: 138.574 kg (305 lb 8 oz) 139.5 kg (307 lb 8.7 oz) 138 kg (304 lb 3.8 oz)    History of present illness:  74 y.o. female with PMH of hypertension, diabetes mellitus, GERD, breast cancer, COPD, arthritis, chronic kidney disease (unknown stage), chronic leg edema, who presents with shortness of breath and syncope.  Patient comes here from [place] for visiting her daughter. She has been doing fine until 11:30 yesterday when she started having SOB. She does not have cough and chest pain. Per her  daughter, and patient moved around in her bed and did some exertion, then she passed out for few seconds. She did not have seizure. No head injury. Patient reports that she is taking Bumex for chronic leg edema, but denies congestive heart failure. She states that her leg swelling has been worsening recently. Patient does not have abdominal pain, diarrhea. No symptoms for UTI. No unilateral weakness numbness or tenderness physician.  Hospital Course:   Acute on chronic hypoxic respiratory failure likely due to NSTEMI and acute on chronic systolic CHF - Progressively improving, slowly but improving -will discharge home on low sodium diet -b-blockers and volume control with HD -patient will be on daily ASA  Acute on chronic systolic CHF exacerbation:  -LBBB of unknown duration -Echo from 5/16 in [place] shows 55-60%- no wall abnormalities, 2-D echo 6/28 showed EF of 30% with akinesis of inferior septum, akinesis of inferior wall, hypokinesis of anterior septum and akinesis of anterior wall -Not a candidate for ACEI/ARB due to chronic kidney disease -Continue Coreg -dialysis for volume control  Anemia-normocytic and probably of chronic disease iron/TIBC ratio above 18% -Aranesp and IV iron per Renal discretion -no transfusion needed during admission -Hgb in 7.5 range at discharge  NSTEMI - 2-D echo 6/28 showed EF of 30% with akinesis of inferior septum, akinesis of inferior wall, hypokinesis of anterior septum and akinesis of anterior wall -continue Aspirin 81 daily, b-blocker and statins -no CP at discharge  AKI on CKD (chronic kidney disease): now ESRD with HD dependency  - 1st hemodialysis 10/21/14---Access has been placed R upper Chest 10/21/14  and patienthad permenent Access placed 7/13  -Set up for OP dialysis t-th-sat [facility] -patient tolerating treatment ok -some low BP with HD; now antihypertensive drugs adjusted -continue working with renal service for dry weight goal  COPD:  -  stable -continue PRN nebulizer therapy -she will need follow up with pulmonologist for formal PFT's -no wheezing   DM-II - Hemoglobin A1c: 9.4 -Insulin-dependent, uncontrolled -advise to follow low carb diet -patient to continue insulin therapy in outpatient setting and to have further adjustments as needed  Hx of Breast cancer:  -s/p of L mastectomy, chemotherapy and radiation therapy -Last seen was 2 months ago, was told that her breast cancer was stable. -Has restriction to LUE 2/2 [place]orange -Follow-up with her oncologist in [place].  Hypertension: Improving/low BP with HD -per cardiology rec's will use only coreg 6.25 BID for now -advise to follow low sodium diet -if BP raises will benefit of resumption of hydralazine  GERD: -Continue PPI   OSA? -de-sat 84% off oxygen when asleep -been told she needs Bi-pap but has never used -will discharge on home oxygen -will benefit of sleep study in outpatient setting  Left Hip pain has history of osteoarthritis : Feeling better today - hip x ray does not show fracture - CT of the left hip showed moderate osteoarthritis with very small joint effusion, no acute or subacute abnormality -Continue PRN analgesics and also physical therapy for conditioning   Morbid Obesity Body mass index is 50.41 kg/(m^2). -encourage weight loss, low calorie diet  Procedures: ECHO 10/12/14: Study Conclusions - Left ventricle: The images without contrast are impossible to interpret. Contrast images show akinesis of the inferior septum, akinesis of the inferior wall, hypokinesis of the anterior septum, and akinesis of the anterior wall. The EF is 30%. - Mitral valve: Calcified annulus. - Right ventricle: The cavity size was normal. Systolic function was normal. - Pulmonary arteries: PA peak pressure: 57 mm Hg (S).  Left heart cath -mild non-obstructive CAD -Severe pulmonary HTN -elevated left ventricle filling  pressure     Consultations:  Cardiology  Vascular surgery  Renal service  Discharge Exam: Filed Vitals:   10/27/14 1318  BP: 118/50  Pulse: 78  Temp: 98.8 F (37.1 C)  Resp: 18    General: No chest pain denies shortness of breath. Good O2 sat duration on current oxygen supplementation  Cardiovascular: S1 and S2, no rubs or gallops Respiratory: decrease BS at bases, no wheezing or frank crackles on exam Abd: soft, NT, ND, positive BS Extremities: no cyanosis or clubbing; 1++ edema bilaterally; chronic LUE lymphedema    Discharge Instructions   Discharge Instructions    Diet - low sodium heart healthy    Complete by:  As directed      Discharge instructions    Complete by:  As directed   Follow low sodium diet (less than 2 g of sodium per day) Check your weight on daily basis (red flag and need to communicate with cardiology/renal center if you gain more than 3 pounds overnight and/or more than 5 pounds in a week) Arrange follow-up with PCP in 2 weeks Follow with cardiology appointment as instructed Take medications as prescribed          Current Discharge Medication List    START taking these medications   Details  aspirin EC 81 MG EC tablet Take 1 tablet (81 mg total) by mouth daily. Qty: 30 tablet, Refills: 0    atorvastatin (LIPITOR) 40 MG tablet Take 1  tablet (40 mg total) by mouth daily at 6 PM. Qty: 30 tablet, Refills: 0      CONTINUE these medications which have CHANGED   Details  carvedilol (COREG) 6.25 MG tablet Take 1 tablet (6.25 mg total) by mouth 2 (two) times daily with a meal. Qty: 60 tablet, Refills: 1      CONTINUE these medications which have NOT CHANGED   Details  capsaicin (ZOSTRIX) 0.025 % cream Apply 1 application topically 2 (two) times daily as needed (pain).    Dextromethorphan Polistirex (DELSYM PO) Take 10 mLs by mouth every 6 (six) hours as needed (cough).    gabapentin (NEURONTIN) 300 MG capsule Take 300 mg by mouth 2  (two) times daily.    guaiFENesin-codeine (ROBITUSSIN AC) 100-10 MG/5ML syrup Take 10 mLs by mouth every 6 (six) hours as needed for cough.    HYDROcodone-acetaminophen (NORCO/VICODIN) 5-325 MG per tablet Take 1 tablet by mouth every 6 (six) hours as needed for moderate pain.    Insulin Detemir (LEVEMIR FLEXPEN) 100 UNIT/ML Pen Inject 70 Units into the skin every morning.    insulin lispro (HUMALOG KWIKPEN) 100 UNIT/ML KiwkPen Inject 6-12 Units into the skin 3 (three) times daily as needed (blood sugar).    Multiple Vitamins-Minerals (CENTRUM SILVER ADULT 50+ PO) Take 1 tablet by mouth daily.    omeprazole (PRILOSEC) 40 MG capsule Take 40 mg by mouth daily.    OVER THE COUNTER MEDICATION Place 2-3 tablets under the tongue every 4 (four) hours as needed (restful legs).    promethazine (PHENERGAN) 12.5 MG tablet Take 12.5 mg by mouth every 6 (six) hours as needed for nausea or vomiting.    Tiotropium Bromide-Olodaterol (STIOLTO RESPIMAT) 2.5-2.5 MCG/ACT AERS Inhale 2 puffs into the lungs 2 (two) times daily as needed (shortness of breath).       STOP taking these medications     bumetanide (BUMEX) 2 MG tablet      cloNIDine (CATAPRES) 0.2 MG tablet        No Known Allergies Follow-up Information    Follow up with Adele Barthel, MD In 6 weeks.   Specialties:  Vascular Surgery, Cardiology   Why:  Our office will call you to arrange an appointment (sent)   Contact information:   [place] [address] [phone]       Follow up with [facility].   Specialty:  [place]   Why:  they will provide your home health care at your daughter's home   Contact information:   [place] [id number] [phone]       Follow up with Richardson Dopp, PA-C On 11/02/2014.   Specialties:  Physician Assistant, Radiology, Interventional Cardiology   Why:  at 2:40 pm for cardiology follow up   Contact information:   [address] [place] [address] [phone]        The results of significant diagnostics from this hospitalization (including imaging, microbiology, ancillary and laboratory) are listed below for reference.    Significant Diagnostic Studies: US Renal  10/12/2014   CLINICAL DATA:  Acute renal insufficiency  EXAM: RENAL / URINARY TRACT ULTRASOUND COMPLETE  COMPARISON:  None.  FINDINGS: Right Kidney:  Length: 11.7 cm.  Mildly increased echogenicity.  No hydronephrosis.  Left Kidney:  Length: 12.1 cm.  Mildly increased echogenicity.  No hydronephrosis.  Bladder:  Appears normal for degree of bladder distention.  IMPRESSION: Negative for hydronephrosis. There is mildly increased renal  parenchymal echogenicity consistent with medical renal disease.   Electronically Signed   By: Andreas Newport M.D.   On: 10/12/2014 06:29   Ct Hip Left Wo Contrast  10/16/2014   CLINICAL DATA:  Patient presented 10/12/2014 with shortness of breath and syncope and was found to have a non ST elevation MI. Prior history of left breast cancer post mastectomy. Acute superimposed upon chronic left hip pain. No known injuries.  EXAM: CT OF THE LEFT HIP WITHOUT CONTRAST  TECHNIQUE: Multidetector CT imaging of the left hip was performed according to the standard protocol. Multiplanar CT image reconstructions were also generated.  COMPARISON:  Left hip x-rays 10/13/2014.  FINDINGS: No evidence of acute, subacute or healed fractures. Severe narrowing of the joint space posteriorly with associated hypertrophic spurring involving the posterior column of the acetabulum. Moderate joint space narrowing elsewhere. Very small joint effusion. No evidence of osseous metastatic disease involving the visualized bones comprising the left hip.  IMPRESSION: 1. No acute or subacute osseous abnormality. 2. Moderate osteoarthritis. 3. Very small joint effusion. 4. No evidence of osseous metastatic disease.   Electronically Signed   By: Evangeline Dakin M.D.    On: 10/16/2014 14:15   Ir Fluoro Guide Cv Line Right  10/21/2014   CLINICAL DATA:  Chronic renal insufficiency, needs access for hemodialysis  EXAM: TUNNELED HEMODIALYSIS CATHETER PLACEMENT WITH ULTRASOUND AND FLUOROSCOPIC GUIDANCE  TECHNIQUE: The procedure, risks, benefits, and alternatives were explained to the patient. Questions regarding the procedure were encouraged and answered. The patient understands and consents to the procedure. As antibiotic prophylaxis, cefazolin was ordered pre-procedure and administered intravenously within one hour of incision.Patency of the right IJ vein was confirmed with ultrasound with image documentation. An appropriate skin site was determined. Region was prepped using maximum barrier technique including cap and mask, sterile gown, sterile gloves, large sterile sheet, and Chlorhexidine as cutaneous antisepsis. The region was infiltrated locally with 1% lidocaine.  Intravenous Fentanyl and Versed were administered as conscious sedation during continuous cardiorespiratory monitoring by the radiology RN, with a total moderate sedation time of 6 minutes.  Under real-time ultrasound guidance, the right IJ vein was accessed with a 21 gauge micropuncture needle; the needle tip within the vein was confirmed with ultrasound image documentation. Needle exchanged over the 018 guidewire for transitional dilator, which allowed advancement of a Benson wire into the IVC. Over this, an MPA catheter was advanced. A Hemosplit 19 hemodialysis catheter was tunneled from the right anterior chest wall approach to the right IJ dermatotomy site. The MPA catheter was exchanged over an Amplatz wire for serial vascular dilators which allow placement of a peel-away sheath, through which the catheter was advanced under intermittent fluoroscopy, positioned with its tips in the proximal and midright atrium. Spot chest radiograph confirms good catheter position. No pneumothorax. Catheter was flushed and  primed per protocol. Catheter secured externally with O Prolene sutures. The right IJ dermatotomy site was closed with Dermabond.  COMPLICATIONS: COMPLICATIONS None immediate  FLUOROSCOPY TIME:  30 seconds, 22 mGy  COMPARISON:  None  IMPRESSION: 1. Technically successful placement of tunneled right IJ hemodialysis catheter with ultrasound and fluoroscopic guidance. Ready for routine use.  ACCESS: Remains approachable for percutaneous intervention as needed.   Electronically Signed   By: Lucrezia Europe M.D.   On: 10/21/2014 11:15   Ir US Guide Vasc Access Right  10/21/2014   CLINICAL DATA:  Chronic renal insufficiency, needs access for hemodialysis  EXAM: TUNNELED HEMODIALYSIS CATHETER PLACEMENT WITH ULTRASOUND AND FLUOROSCOPIC  GUIDANCE  TECHNIQUE: The procedure, risks, benefits, and alternatives were explained to the patient. Questions regarding the procedure were encouraged and answered. The patient understands and consents to the procedure. As antibiotic prophylaxis, cefazolin was ordered pre-procedure and administered intravenously within one hour of incision.Patency of the right IJ vein was confirmed with ultrasound with image documentation. An appropriate skin site was determined. Region was prepped using maximum barrier technique including cap and mask, sterile gown, sterile gloves, large sterile sheet, and Chlorhexidine as cutaneous antisepsis. The region was infiltrated locally with 1% lidocaine.  Intravenous Fentanyl and Versed were administered as conscious sedation during continuous cardiorespiratory monitoring by the radiology RN, with a total moderate sedation time of 6 minutes.  Under real-time ultrasound guidance, the right IJ vein was accessed with a 21 gauge micropuncture needle; the needle tip within the vein was confirmed with ultrasound image documentation. Needle exchanged over the 018 guidewire for transitional dilator, which allowed advancement of a Benson wire into the IVC. Over this, an MPA  catheter was advanced. A Hemosplit 19 hemodialysis catheter was tunneled from the right anterior chest wall approach to the right IJ dermatotomy site. The MPA catheter was exchanged over an Amplatz wire for serial vascular dilators which allow placement of a peel-away sheath, through which the catheter was advanced under intermittent fluoroscopy, positioned with its tips in the proximal and midright atrium. Spot chest radiograph confirms good catheter position. No pneumothorax. Catheter was flushed and primed per protocol. Catheter secured externally with O Prolene sutures. The right IJ dermatotomy site was closed with Dermabond.  COMPLICATIONS: COMPLICATIONS None immediate  FLUOROSCOPY TIME:  30 seconds, 22 mGy  COMPARISON:  None  IMPRESSION: 1. Technically successful placement of tunneled right IJ hemodialysis catheter with ultrasound and fluoroscopic guidance. Ready for routine use.  ACCESS: Remains approachable for percutaneous intervention as needed.   Electronically Signed   By: Lucrezia Europe M.D.   On: 10/21/2014 11:15   Dg Chest Port 1 View  10/15/2014   CLINICAL DATA:  CHF  EXAM: PORTABLE CHEST - 1 VIEW  COMPARISON:  10/12/2014  FINDINGS: Cardiomegaly with mild perihilar edema, improved. Left lung base is obscured, likely a combination of atelectasis and small pleural effusion.  IMPRESSION: Cardiomegaly with mild perihilar edema, improved.  Suspected small left pleural effusion.   Electronically Signed   By: Julian Hy M.D.   On: 10/15/2014 08:53   Dg Chest Portable 1 View  10/12/2014   CLINICAL DATA:  Respiratory distress  EXAM: PORTABLE CHEST - 1 VIEW  COMPARISON:  None.  FINDINGS: Central and basilar airspace opacities are present bilaterally. There is cardiomegaly. There probably is a small left pleural effusion.  IMPRESSION: Extensive airspace opacities and central and basilar distribution. This may represent alveolar edema associated with congestive heart failure. Probable left effusion.  Cannot exclude infectious infiltrates.   Electronically Signed   By: Andreas Newport M.D.   On: 10/12/2014 01:51   Dg Hip Unilat With Pelvis 2-3 Views Left  10/13/2014   CLINICAL DATA:  Left hip pain.  EXAM: LEFT HIP (WITH PELVIS) 2-3 VIEWS  COMPARISON:  None.  FINDINGS: The left hip is located. No acute bone or soft tissue abnormality is present. Degenerative changes are noted in the SI joints bilaterally and lower lumbar spine.  IMPRESSION: 1. Normal radiographic appearance of the left hip. 2. Degenerative changes within the lower lumbar spine and SI joints.   Electronically Signed   By: San Morelle M.D.   On: 10/13/2014 09:48  Microbiology: No results found for this or any previous visit (from the past 240 hour(s)).   Labs: Basic Metabolic Panel:  Recent Labs Lab 10/21/14 2315 10/22/14 1530 10/23/14 0844 10/25/14 0830 10/26/14 0307 10/27/14 0924  NA 138 138 134* 134* 136 134*  K 4.5 3.9 4.2 4.2 4.4 4.8  CL 99* 97* 97* 98* 99* 98*  CO2 30 30 30 28 29 28   GLUCOSE 141* 114* 212* 134* 122* 183*  BUN 96* 57* 33* 37* 26* 42*  CREATININE 4.93* 3.64* 3.35* 5.53* 4.51* 6.05*  CALCIUM 7.1* 7.7* 7.6* 8.0* 7.9* 8.0*  PHOS 5.9* 4.3 3.8 4.7*  --  5.0*   Liver Function Tests:  Recent Labs Lab 10/21/14 2315 10/22/14 1530 10/23/14 0844 10/25/14 0830 10/27/14 0924  ALBUMIN 2.8* 2.8* 2.6* 2.7* 2.6*   CBC:  Recent Labs Lab 10/21/14 2315 10/22/14 1530 10/23/14 0844 10/25/14 0829 10/27/14 0928  WBC 9.1 10.0 11.6* 12.1* 12.7*  NEUTROABS 6.8  --   --  9.8*  --   HGB 7.8* 7.9* 7.4* 7.2* 7.5*  HCT 25.2* 26.0* 24.5* 24.1* 25.0*  MCV 85.1 84.4 85.7 86.1 86.2  PLT 225 216 199 205 225   BNP (last 3 results)  Recent Labs  10/12/14 0100  BNP 143.8*    CBG:  Recent Labs Lab 10/26/14 1629 10/26/14 2119 10/27/14 0548 10/27/14 1103 10/27/14 1451  GLUCAP 212* 186* 182* 157* 182*    Signed:  Barton Dubois  [facility] 10/27/2014, 4:05 PM

## 2014-10-27 NOTE — Telephone Encounter (Signed)
New message     TCM appt on  7.19.2016 @ 2:40 pm per Cecilie Kicks

## 2014-10-27 NOTE — Progress Notes (Signed)
We are asked back to see pt for medication adjustments with hypotension now that she is having dialysis.  She was admitted 10/12/14 with SOB and syncope- CHF exacerbation.  She hs hx of chronic LBBB, DM-II COPD, HTN, breast cancer, HTN, and CKD now undergoing dialysis.   She had been having chest pain and did develop elevated troponins 0.51 & 0.58.  Ultimately underwent cardiac cath:    Mild nonobstructive CAD  Severe pulmonary HTN  Elevated LV filling pressures.  Normal cardiac output  PA systolic presure 62. ECHO with EF 30%.  --Post cath Cr. Higher and pt having HD.  We last saw her on 10/22/14.  Now desats to 84% on RA - having trouble dialyzing her due to low BP.   Subjective: No chest pain, just became very weak and hot in dialysis  Objective: Vital signs in last 24 hours: Temp:  [97.2 F (36.2 C)-98.3 F (36.8 C)] 97.2 F (36.2 C) (07/13 0900) Pulse Rate:  [58-83] 78 (07/13 1318) Resp:  [12-18] 15 (07/13 0900) BP: (103-145)/(30-73) 118/50 mmHg (07/13 1318) SpO2:  [95 %-100 %] 98 % (07/13 0900) Weight:  [305 lb 8 oz (138.574 kg)-307 lb 8.7 oz (139.5 kg)] 307 lb 8.7 oz (139.5 kg) (07/13 0900) Weight change: -5 lb 2.1 oz (-2.326 kg) Last BM Date: 10/26/14 Intake/Output from previous day: since admit -14,107  07/12 0701 - 07/13 0700 In: 710 [P.O.:360; I.V.:350] Out: 0  Intake/Output this shift: Total I/O In: 360 [P.O.:360] Out: 835 [Other:835]  PE: General:Pleasant affect, NAD Skin:Warm and dry, brisk capillary refill HEENT:normocephalic, sclera clear, mucus membranes moist Neck:supple, no JVD sitting upright , no bruits  Heart:S1S2 RRR without murmur-muffled hear sounds, no gallup, rub or click Lungs:diminished breath sounds throughout without rales, Occ. rhonchi, rare wheeze [id number], soft, non tender, + BS, do not palpate liver spleen or masses Ext:1-2+ lower ext edema,  2+ radial pulses Neuro:alert and oriented, MAE, follows commands, + facial  symmetry Tele: SR some episodes of WCT- NSVT- earlier in stay  Lab Results:  Recent Labs  10/25/14 0829 10/27/14 0928  WBC 12.1* 12.7*  HGB 7.2* 7.5*  HCT 24.1* 25.0*  PLT 205 225   BMET  Recent Labs  10/26/14 0307 10/27/14 0924  NA 136 134*  K 4.4 4.8  CL 99* 98*  CO2 29 28  GLUCOSE 122* 183*  BUN 26* 42*  CREATININE 4.51* 6.05*  CALCIUM 7.9* 8.0*   No results for input(s): TROPONINI in the last 72 hours.  Invalid input(s): CK, MB  Lab Results  Component Value Date   CHOL 199 10/12/2014   HDL 53 10/12/2014   LDLCALC 123* 10/12/2014   TRIG 113 10/12/2014   CHOLHDL 3.8 10/12/2014   Lab Results  Component Value Date   HGBA1C 9.4* 10/12/2014     No results found for: TSH  Hepatic Function Panel  Recent Labs  10/27/14 0924  ALBUMIN 2.6*   No results for input(s): CHOL in the last 72 hours. No results for input(s): PROTIME in the last 72 hours.     Studies/Results: ECHO 10/12/14: Study Conclusions  - Left ventricle: The images without contrast are impossible to interpret. Contrast images show akinesis of the inferior septum, akinesis of the inferior wall, hypokinesis of the anterior septum, and akinesis of the anterior wall. The EF is 30%. - Mitral valve: Calcified annulus. - Right ventricle: The cavity size was normal. Systolic function was normal. - Pulmonary arteries: PA peak pressure: 57 mm  Hg (S).   Medications: I have reviewed the patient's current medications. Scheduled Meds: . antiseptic oral rinse  7 mL Mouth Rinse BID  . aspirin EC  81 mg Oral Daily  . atorvastatin  40 mg Oral q1800  . carvedilol  6.25 mg Oral BID WC  . darbepoetin (ARANESP) injection - NON-DIALYSIS  100 mcg Subcutaneous Q Wed-1800  . docusate sodium  100 mg Oral BID  . ferric gluconate (FERRLECIT/NULECIT) IV  125 mg Intravenous Q M,W,F-HD  . gabapentin  300 mg Oral QHS  . heparin  5,000 Units Subcutaneous 3 times per day  . hydrALAZINE  25 mg Oral 3  times per day  . insulin aspart  0-15 Units Subcutaneous TID WC  . insulin aspart  0-5 Units Subcutaneous QHS  . insulin aspart  10 Units Subcutaneous TID WC  . insulin detemir  60 Units Subcutaneous Daily  . lidocaine  1 patch Transdermal Q24H  . multivitamin with minerals  1 tablet Oral Daily  . pantoprazole  40 mg Oral Daily  . sodium chloride  3 mL Intravenous Q12H   Continuous Infusions: . sodium chloride 10 mL/hr at 10/26/14 0909   PRN Meds:.sodium chloride, acetaminophen, albuterol, capsaicin, guaiFENesin-codeine, heparin, heparin, ipratropium-albuterol, menthol-cetylpyridinium, ondansetron (ZOFRAN) IV, oxyCODONE, polyethylene glycol, sodium chloride  Assessment/Plan: . NSTEMI. Troponin elevation with flat curve - demand type ischemia due to CHF and hypoxia. Echo demonstrates severe LV dysfunction with EF of 30% and regional wall motion abnormalities. Cath 10/19/14 showed minimal CAD. - May need repeat study in 3 months.  - Continue ASA, Lipitor 40mg , coreg 25mg  has been decreased to 6.25 BID due to hypotension with dialysis, imdur was at 120 now stopped.   Will stop hydralazine - would like to leave croeg where it is unless still with BP issue then only to 3.125 BID.  We will see her back in clinic.   2. Acute on chronic systolic CHF. EF 30%. I/O negative 14,107 since admission. wt down from 334 at pk wt to 304 today.  She is on hydralazine which has been decreased from 100 TID to 25 mg TID. Imdur stopped.   Discontinued lasix. Avoid ACE or ARB. Now on dialysis but difficulty pulling off fluid due to hypotension. She will need follow up with cardiology- Dr. Martinique.  Will arrange  3. CKD stage 4. Progressed to ESRD. HD today as well.  right brachiocephalic fistula placed yesterday by Dr. Bridgett Larsson. Nephrology following. Discontinued lasix.   4. DM poorly controlled. On insulin. Per primary team.   5. HTN- running low with dialysis On decreased doses of Coreg &, hydralazine.   6.  Hyperlipidemia - continue statins.   7. COPD. Breathing stable  8. History of breast CA With left arm lymphedema - do not use for IV or radial access  9. Super Morbid Obesity.  10. Pulmonary HTN - suspect major component of OSA/obesity-hypoventilation, but also left heart failure  11. LBBB - CRT may be an option down the road, if she fails medical management. Obesity increases complexity of device implantation and increases risk of complications.  12. Anemia with hgb of 7.5 may benefit from blood transfusion. - Per perimary  13. NSVT - earlier in stay.  LOS: 15 days   Time spent with pt. : 20 minutes. [employer] R  Nurse Practitioner Certified Pager [phone] or after 5pm and on weekends call [phone] 10/27/2014, 1:24 PM   I have personally seen and examined this patient with Cecilie Kicks, NP. I agree with  the assessment and plan as outlined above. She was hypotensive today in HD. She has been on Coreg 6.25 mg po BID, Hydralazine 25 mg po TID and Isordil. Her Isordil has been held. Given her hypotension and inability to complete HD, would d/c hydralazine. Would continue Coreg. If she is hypertensive over the next few weeks, may be able to add back hydralazine.   Darnell Stimson 10/27/2014 2:48 PM

## 2014-10-28 NOTE — Telephone Encounter (Signed)
lmtc

## 2014-10-29 ENCOUNTER — Telehealth: Payer: Self-pay | Admitting: Vascular Surgery

## 2014-10-29 NOTE — Telephone Encounter (Signed)
[facility] 10/29/14 10:49am

## 2014-10-29 NOTE — Telephone Encounter (Addendum)
-----   Message from Denman George, RN sent at 10/26/2014 12:14 PM EDT ----- Regarding: Zigmund Daniel log; also needs 6 wk f/u with [facility]; no duplex   ----- Message -----    From: Conrad Silverton, MD    Sent: 10/26/2014  11:56 AM      To: [facility] [address]  Jordan [name] [id number] 10-15-1940  PROCEDURE: right brachiocephalic arteriovenous fistula placement  Asst: Silva Bandy, [facility]   Follow-up: 6 weeks   10/29/14: LM for pt re appt, dpm

## 2014-11-01 NOTE — Telephone Encounter (Signed)
Patient contacted regarding discharge from [facility] on 10/27/2014.  Patient understands to follow up with provider Melina Copa, PA on 11/12/2014 at 11:30a at [facility]. Patient understands discharge instructions? yes Patient understands medications and regiment? yes Patient understands to bring all medications to this visit? yes

## 2014-11-02 ENCOUNTER — Encounter: Payer: Medicare (Managed Care) | Admitting: Physician Assistant

## 2014-11-05 ENCOUNTER — Telehealth: Payer: Self-pay | Admitting: Cardiology

## 2014-11-05 NOTE — Telephone Encounter (Signed)
[facility] ) is needing an order for a bariatric beside commode. Please fax order to [phone].Marland Kitchen  Thanks

## 2014-11-05 NOTE — Telephone Encounter (Signed)
Returned call to Chyrl Civatte with [facility] no answer.Unable to leave message no voice mail.

## 2014-11-10 ENCOUNTER — Encounter: Payer: Self-pay | Admitting: Physician Assistant

## 2014-11-10 ENCOUNTER — Ambulatory Visit (INDEPENDENT_AMBULATORY_CARE_PROVIDER_SITE_OTHER): Payer: Medicare (Managed Care) | Admitting: Physician Assistant

## 2014-11-10 DIAGNOSIS — R0602 Shortness of breath: Secondary | ICD-10-CM | POA: Diagnosis not present

## 2014-11-10 DIAGNOSIS — I447 Left bundle-branch block, unspecified: Secondary | ICD-10-CM

## 2014-11-10 DIAGNOSIS — I5021 Acute systolic (congestive) heart failure: Secondary | ICD-10-CM

## 2014-11-10 DIAGNOSIS — I10 Essential (primary) hypertension: Secondary | ICD-10-CM

## 2014-11-10 DIAGNOSIS — I429 Cardiomyopathy, unspecified: Secondary | ICD-10-CM

## 2014-11-10 DIAGNOSIS — I5023 Acute on chronic systolic (congestive) heart failure: Secondary | ICD-10-CM

## 2014-11-10 DIAGNOSIS — I428 Other cardiomyopathies: Secondary | ICD-10-CM | POA: Insufficient documentation

## 2014-11-10 MED ORDER — GABAPENTIN 300 MG PO CAPS: 300.0000 mg | ORAL_CAPSULE | Freq: Two times a day (BID) | ORAL | Status: DC

## 2014-11-10 NOTE — Assessment & Plan Note (Signed)
We discussed dietary changes.

## 2014-11-10 NOTE — Assessment & Plan Note (Signed)
Currently hypotensive. Asymptomatic no changes.

## 2014-11-10 NOTE — Assessment & Plan Note (Signed)
She is hypotensive today. She does not complain of any dizziness or presyncope. We'll continue Coreg

## 2014-11-10 NOTE — Assessment & Plan Note (Signed)
Status post left heart catheterization revealing minimal coronary artery disease.  Ejection fraction 30% based on echo.  She is hypotensive so no room for ACE inhibitor.

## 2014-11-10 NOTE — Patient Instructions (Addendum)
Medication Instructions:   Your physician recommends that you continue on your current medications as directed. Please refer to the Current Medication list given to you today.   Labwork:   Testing/Procedures:   Follow-Up:   3 MONTHS WITH DR Martinique   Any Other Special Instructions Will Be Listed Below (If Applicable).

## 2014-11-10 NOTE — Assessment & Plan Note (Signed)
Multifactorial including COPD, obesity and deconditioning.

## 2014-11-10 NOTE — Assessment & Plan Note (Signed)
She is currently volume overloaded however, her weight is consistently going down each hemodialysis session.  Recommended that she keep her legs elevated as much as possible when she is sitting down.

## 2014-11-10 NOTE — Progress Notes (Signed)
Patient ID: Jordan [name], female   DOB: [birth year], 74 y.o.   MRN: [id number]    Date:  11/10/2014   ID:  Jordan [name], DOB [birth year], MRN [id number]  PCP:  Pcp Not In System  Primary Cardiologist:  Martinique   Chief Complaint  Patient presents with  . Cough     History of Present Illness: Jordan [name] is a 74 y.o. morbidly obese female  from [place], [place], outside of [place]. She is in [place] visiting her daughter and grandchildren. She has a h/o HTN, DM, COPD, CKD and breast CA s/p chemo and radiation. She presented to the [facility] ED, on 10/12/2014, via EMS after being found unresponsive with shallow breaths.  She underwent cardiac catheterization which found mild nonobstructive coronary disease, severe pulmonary hypertension, elevated LV filling pressures and normal cardiac output.  echocardiogram revealed an ejection fraction of 30%. There is akinesis of the inferior septum,akinesis of the inferior wall, hypokinesis of the anteriorseptum, and akinesis of the anterior wall.  PA pressure was 57 mmHg.  She is no end-stage renal disease with hemodialysis dependency. Hemodialysis session was 10/21/2014.  She was discharge on home oxygen.  Patient presents for posthospital cardiac evaluation.  She reports doing well. She does continue to have some lower extremity edema however, her weight has gone down from 304 on July 13. Her fluid level is managed at dialysis. Short of breath with activity which is likely due to her obesity and deconditioning as well as COPD. She sleeps on about 3-4 pillows and has been doing this for almost 10 years.  She does report pain in her ankles and feet.  Mild left lower quadrant pain and was told during a recent dialysis visit take a laxitive.  The patient currently denies nausea, vomiting, fever, chest pain, dizziness, PND, cough, congestion, abdominal pain, hematochezia, melena,.  Wt Readings from Last 3 Encounters:  11/10/14 288 lb  (130.636 kg)  10/27/14 304 lb 3.8 oz (138 kg)     Past Medical History  Diagnosis Date  . Breast cancer   . Diabetes mellitus without complication   . Hypertension   . Arthritis   . COPD (chronic obstructive pulmonary disease)   . Renal disorder     Family reports acute renal failure  . GERD (gastroesophageal reflux disease)   . CKD (chronic kidney disease)     Current Outpatient Prescriptions  Medication Sig Dispense Refill  . carvedilol (COREG) 6.25 MG tablet Take 1 tablet (6.25 mg total) by mouth 2 (two) times daily with a meal. 60 tablet 1  . gabapentin (NEURONTIN) 300 MG capsule Take 1 capsule (300 mg total) by mouth 2 (two) times daily. 60 capsule 6  . Insulin Detemir (LEVEMIR FLEXPEN) 100 UNIT/ML Pen Inject 70 Units into the skin every morning.    . insulin lispro (HUMALOG KWIKPEN) 100 UNIT/ML KiwkPen Inject 6-12 Units into the skin 3 (three) times daily as needed (blood sugar).    . Multiple Vitamins-Minerals (CENTRUM SILVER ADULT 50+ PO) Take 1 tablet by mouth daily.     No current facility-administered medications for this visit.    Allergies:   No Known Allergies  Social History:  The patient  reports that she has never smoked. She does not have any smokeless tobacco history on file. She reports that she does not drink alcohol.   Family history:   Family History  Problem Relation Age of Onset  . Diabetes Mother   . Hypertension Mother   . Stroke Sister   .  Hypertension Sister   . Heart attack Neg Hx     ROS:  Please see the history of present illness.  All other systems reviewed and negative.   PHYSICAL EXAM: VS:  BP 90/60 mmHg  Pulse 76  Ht 5\' 5"  (1.651 m)  Wt 288 lb (130.636 kg)  BMI 47.93 kg/m2 Morbidly obese well developed, in no acute distress HEENT: Pupils are equal round react to light accommodation extraocular movements are intact.  Neck: no JVDNo cervical lymphadenopathy. Cardiac: Regular rate and rhythm without murmurs rubs or gallops.Heart  sounds are difficult to hear. Pulses regular Lungs:  clear to auscultation bilaterally, no wheezing, rhonchi or rales Abd: soft, nontender, positive bowel sounds Ext: 3+ tense lower extremity edema.  2+ radial and 1+dorsalis pedis pulses. Skin: warm and dry Neuro:  Grossly normal  Lipid Panel     Component Value Date/Time   CHOL 199 10/12/2014 0745   TRIG 113 10/12/2014 0745   HDL 53 10/12/2014 0745   CHOLHDL 3.8 10/12/2014 0745   VLDL 23 10/12/2014 0745   LDLCALC 123* 10/12/2014 0745     EKG:  Sinus rhythm first degree AV block. 76 bpm. LBBB  ASSESSMENT AND PLAN:  Problem List Items Addressed This Visit    SOB (shortness of breath)    Multifactorial including COPD, obesity and deconditioning.      Nonischemic cardiomyopathy    Status post left heart catheterization revealing minimal coronary artery disease.  Ejection fraction 30% based on echo.  She is hypotensive so no room for ACE inhibitor.      Morbidly obese - Primary    We discussed dietary changes.      LBBB (left bundle branch block)   Essential hypertension    Currently hypotensive. Asymptomatic no changes.      Acute systolic CHF (congestive heart failure)   Relevant Orders   EKG 12-Lead   Acute on chronic systolic heart failure, NYHA class 3    She is currently volume overloaded however, her weight is consistently going down each hemodialysis session.  Recommended that she keep her legs elevated as much as possible when she is sitting down.

## 2014-11-10 NOTE — Addendum Note (Signed)
Addended by: Brett Canales on: 11/10/2014 04:49 PM   Modules accepted: Level of Service

## 2014-11-12 ENCOUNTER — Ambulatory Visit: Payer: Medicare (Managed Care) | Admitting: Physician Assistant

## 2014-11-15 NOTE — Telephone Encounter (Signed)
Returned call to Herman [phone] already closed.

## 2014-11-17 NOTE — Telephone Encounter (Signed)
Advised to have PCP sign order for bariatric beside commode.Note faxed back to [place] at fax # [phone].

## 2014-12-08 ENCOUNTER — Encounter (INDEPENDENT_AMBULATORY_CARE_PROVIDER_SITE_OTHER): Payer: Self-pay

## 2014-12-08 ENCOUNTER — Ambulatory Visit (INDEPENDENT_AMBULATORY_CARE_PROVIDER_SITE_OTHER): Payer: Medicare (Managed Care) | Admitting: Family Medicine

## 2014-12-08 ENCOUNTER — Encounter: Payer: Self-pay | Admitting: Family Medicine

## 2014-12-08 VITALS — BP 138/80 | HR 71 | Temp 97.9°F | Ht 65.0 in | Wt 271.0 lb

## 2014-12-08 DIAGNOSIS — Z992 Dependence on renal dialysis: Secondary | ICD-10-CM

## 2014-12-08 DIAGNOSIS — C50912 Malignant neoplasm of unspecified site of left female breast: Secondary | ICD-10-CM

## 2014-12-08 DIAGNOSIS — I890 Lymphedema, not elsewhere classified: Secondary | ICD-10-CM | POA: Diagnosis not present

## 2014-12-08 DIAGNOSIS — E1121 Type 2 diabetes mellitus with diabetic nephropathy: Secondary | ICD-10-CM

## 2014-12-08 DIAGNOSIS — G4733 Obstructive sleep apnea (adult) (pediatric): Secondary | ICD-10-CM | POA: Diagnosis not present

## 2014-12-08 DIAGNOSIS — N186 End stage renal disease: Secondary | ICD-10-CM

## 2014-12-08 DIAGNOSIS — Z853 Personal history of malignant neoplasm of breast: Secondary | ICD-10-CM | POA: Diagnosis not present

## 2014-12-08 DIAGNOSIS — K5900 Constipation, unspecified: Secondary | ICD-10-CM

## 2014-12-08 DIAGNOSIS — I5023 Acute on chronic systolic (congestive) heart failure: Secondary | ICD-10-CM

## 2014-12-08 NOTE — Assessment & Plan Note (Signed)
CBGs running closer to goal since starting dialysis. Will continue levemir at current dose and change humalog SSI per AVS. Continue to monitor CBGs at home. Will call office if CBG <80 or >350, or consistently >250.

## 2014-12-08 NOTE — Progress Notes (Signed)
Patient ID: Jordan [name], female   DOB: 1940/11/16, 74 y.o.   MRN: [id number]  Jordan Rumps, MD Phone: [phone]  Jordan [name] is a 74 y.o. female who presents today for new patient visit.  Lymphedema: patient reports history of breast cancer with left mastectomy and lymph node dissection in 2014. She reports since that time she has had slowly progressive lymphedema in her left arm. Notes this has progressively worsened. Notes she has had tingling and electrical type sensation in her arm associated with this. She notes at times the swelling is enough to make gripping difficult. She denies neck pain with this. Was followed by oncology for this in [place] and last saw them in April.   DIABETES Disease Monitoring: Blood Sugar ranges-107-230 Polyuria/phagia/dipsia- no      Visual problems- no Medications: Compliance- taking levemir 70 u daily, humalog 2-8 units on sliding scale that patient has done on her own Hypoglycemic symptoms- no  Constipation: notes since starting on dialysis she has had constipation with BMs every 2-3 days. Notes she has been taking dulcolax for this. No abdominal pain, nausea, vomiting, or diarrhea. Has had issues with this in the past and used prune juice, though this has not helped now.   OSA: patient with a possible history of OSA in the past. Used CPAP for a number of years. Has been on home O2 with ambulation. Per the patient her prior pulmonologist advised her she did not need the CPAP anymore and just needed the prn O2. No dyspnea at this time. Notes she snores. No apneic events noted. Wakes up well rested.   Patient is s/p hospitalization for NSTEMI during which she was found to have CHF with EF 30%. Cardiac cath revealed mild non-obstructive CAD, sever pulmonary HTN, elevated LV filling pressure, and normal cardiac output. She was additionally noted to be ESRD during hospitalization and has been on dialysis TTS at [facility] in [place]. Has  followed up with cards already. Has next follow-up in October. See's renal weekly at dialysis. Is following up with vascular surgery later this week. She notes she feels much improved from discharge. Notes no dyspnea or CP at this time. Notes swelling is much improved.   Patient also had question regarding her suture line on skull. Notes that she noticed this indentation 5-6 months ago in frontal area of hair line. No head injury. Notes she had not felt this previously though she notes she had not previously felt for this as she had thick hair prior to her chemo and radiation. Notes this area has not changed at all. No vision changes, weakness, or numbness with this.   Active Ambulatory Problems    Diagnosis Date Noted  . Breast cancer   . Hypertension   . Arthritis   . COPD (chronic obstructive pulmonary disease)   . Syncope 10/12/2014  . SOB (shortness of breath) 10/12/2014  . Chest pain with high risk for cardiac etiology 10/12/2014  . LBBB (left bundle branch block) 10/12/2014  . NSTEMI (non-ST elevated myocardial infarction) 10/13/2014  . Pulmonary HTN 10/13/2014  . Left hip pain   . Essential hypertension   . ESRD (end stage renal disease) on dialysis   . Type 2 diabetes mellitus with diabetic nephropathy   . Nonischemic cardiomyopathy 11/10/2014  . Acute on chronic systolic heart failure, NYHA class 3 11/10/2014  . Morbidly obese 11/10/2014  . Lymphedema of left arm 12/08/2014  . OSA (obstructive sleep apnea) 12/08/2014  . Constipation 12/08/2014  Resolved Ambulatory Problems    Diagnosis Date Noted  . CHF exacerbation 10/12/2014  . Diabetes mellitus without complication   . CKD (chronic kidney disease) 10/12/2014  . Acute on chronic respiratory failure 10/12/2014  . Elevated troponin 10/12/2014  . Acute systolic CHF (congestive heart failure) 10/13/2014  . AKI (acute kidney injury)   . CKD (chronic kidney disease) stage 4, GFR 15-29 ml/min    Past Medical History    Diagnosis Date  . Renal disorder   . GERD (gastroesophageal reflux disease)     Family History  Problem Relation Age of Onset  . Diabetes Mother   . Hypertension Mother   . Stroke Sister   . Hypertension Sister   . Heart attack Neg Hx     Social History   Social History  . Marital Status: Single    Spouse Name: [name]  . Number of Children: [name]  . Years of Education: [name]   Occupational History  . Not on file.   Social History Main Topics  . Smoking status: Never Smoker   . Smokeless tobacco: Not on file  . Alcohol Use: No  . Drug Use: Not on file  . Sexual Activity: Not on file   Other Topics Concern  . Not on file   Social History Narrative    ROS   General:  Negative for nexplained weight loss, fever Skin: Negative for new or changing mole, sore that won't heal HEENT: Negative for trouble hearing, trouble seeing, ringing in ears, mouth sores, hoarseness, change in voice, dysphagia. CV:  Negative for chest pain, dyspnea, LE edema, palpitations Resp: Negative for cough, dyspnea, hemoptysis GI: Positive for constipation, Negative for nausea, vomiting, diarrhea, abdominal pain, melena, hematochezia. GU: Negative for dysuria, incontinence, urinary hesitance, hematuria, vaginal or penile discharge, polyuria, sexual difficulty, lumps in testicle or breasts MSK: Positive for left arm lymphedema and tingling, Negative for muscle cramps or aches, joint pain  Neuro: Negative for headaches, weakness, numbness, dizziness, passing out/fainting Psych: Negative for depression, anxiety, memory problems   Objective  Physical Exam Filed Vitals:   12/08/14 0900  BP: 138/80  Pulse:   Temp:     BP Readings from Last 3 Encounters:  12/08/14 138/80  11/10/14 90/60  10/27/14 149/65   Wt Readings from Last 3 Encounters:  12/08/14 271 lb (122.925 kg)  11/10/14 288 lb (130.636 kg)  10/27/14 304 lb 3.8 oz (138 kg)    Physical Exam  Constitutional: She is well-developed,  well-nourished, and in no distress.  HENT:  Head: Normocephalic and atraumatic.  Right Ear: External ear normal.  Left Ear: External ear normal.  Mouth/Throat: Oropharynx is clear and moist.  Eyes: Conjunctivae are normal. Pupils are equal, round, and reactive to light.  Neck: Neck supple.  Cardiovascular: Normal rate and regular rhythm.  Exam reveals no gallop.   No murmur heard. Pulmonary/Chest: Effort normal. No respiratory distress. She has no wheezes. She has no rales.  Abdominal: Soft. She exhibits no distension. There is no tenderness. There is no rebound and no guarding.  Musculoskeletal:  LUE with lymphedema noted, no edema RUE, trace edema bilateral LE, no axillary or chest wall masses palpated on the left, no axillary or breast masses palpated on the right, neck full ROM, negative spurlings  Lymphadenopathy:    She has no cervical adenopathy.  Neurological:  CN 2-12 intact, 5/5 strength in bilateral biceps, triceps, grip, quads, hamstrings, plantar and dorsiflexion, sensation to light touch intact in bilateral UE and LE, 2+  patellar and brachioradialis reflexes  Skin: Skin is warm and dry. No rash noted. She is not diaphoretic.  Psychiatric: Mood and affect normal.     Assessment/Plan:   ESRD (end stage renal disease) on dialysis Will continue to follow with dialysis and renal. Will request renal records.   Type 2 diabetes mellitus with diabetic nephropathy CBGs running closer to goal since starting dialysis. Will continue levemir at current dose and change humalog SSI per AVS. Continue to monitor CBGs at home. Will call office if CBG <80 or >350, or consistently >250.   Breast cancer Patient with LUE lymphedema relating to lymph node dissection. Breast and axilla exam today with no masses. Will refer to oncology for follow-up. Will refer to PT for lymph edema.   Lymphedema of left arm Patient with lymphedema in LUE. Slowly progressive. Non-pitting. No RUE edema. No  axillary or chest wall abnormalities. Suspect that tingling is related to increased swelling. Had normal neuro exam today. Negative spurlings. Will refer to PT for lymphedema treatment and to oncology for follow-up. Advised of return precautions.   Acute on chronic systolic heart failure, NYHA class 3 Weight continues to trend down. Improved since discharge. Denies SOB and notes swelling is much improved. Will continue to monitor and continue coreg at 6.25 mg BID dosing. Given return precautions.   OSA (obstructive sleep apnea) History of OSA. Concern in hospital for this given desat while sleeping. Normal range O2 sat now. Has home O2. Will obtain sleep study and refer to pulmonology.   Constipation Well treated with dulcolax. Normal abdominal exam. No pain, nausea, vomiting, or diarrhea. Will continue prn dulcolax. Given return precautions.    Discussed indentation is likely the area where her skull bones came together. No injury to the area. She has no neurological abnormalities and states the area has been stable. Reassured patient and advised to monitor this. If worsens or if she develops neurological issues she will seek medical attention.   Orders Placed This Encounter  Procedures  . Ambulatory referral to Pulmonology    Referral Priority:  Routine    Referral Type:  Consultation    Referral Reason:  Specialty Services Required    Requested Specialty:  Pulmonary Disease    Number of Visits Requested:  1  . Ambulatory referral to Physical Therapy    Referral Priority:  Routine    Referral Type:  Physical Medicine    Referral Reason:  Specialty Services Required    Requested Specialty:  Physical Therapy    Number of Visits Requested:  1  . Ambulatory referral to Oncology    Referral Priority:  Routine    Referral Type:  Consultation    Referral Reason:  Specialty Services Required    Number of Visits Requested:  1  . Split night study    Standing Status: Future     Number of  Occurrences:      Standing Expiration Date: 12/08/2015    Order Specific Question:  Where should this test be performed:    Answer:  [facility]

## 2014-12-08 NOTE — Assessment & Plan Note (Signed)
Patient with lymphedema in LUE. Slowly progressive. Non-pitting. No RUE edema. No axillary or chest wall abnormalities. Suspect that tingling is related to increased swelling. Had normal neuro exam today. Negative spurlings. Will refer to PT for lymphedema treatment and to oncology for follow-up. Advised of return precautions.

## 2014-12-08 NOTE — Patient Instructions (Signed)
Nice to meet you. We will refer you to pulmonology and oncology.  We will also refer you to physical therapy for your lymphedema. We will send you for a sleep study as well.  You can continue the dulcolax as needed for constipation. If you develop abdominal pain, vomiting, diarrhea, blood in stool, or fever please seek medical attention.  If you develop shortness of breath, chest pain, weakness, numbness, vision changes, neck pain, or swelling please seek medical attention.   You sliding scale for humalog will be as follows: Blood sugar <150 use 0 units Blood sugar 150-200 use 1 units Blood sugar 201-250 use 2 units Blood sugar 251-300 use 3 units Blood sugar 301-350 use 4 units Blood sugar >350 take 5 units and call the office. Call the office if your blood sugar is <80, or if you develop tremor, shakiness, or sweating.

## 2014-12-08 NOTE — Assessment & Plan Note (Signed)
Weight continues to trend down. Improved since discharge. Denies SOB and notes swelling is much improved. Will continue to monitor and continue coreg at 6.25 mg BID dosing. Given return precautions.

## 2014-12-08 NOTE — Assessment & Plan Note (Signed)
Well treated with dulcolax. Normal abdominal exam. No pain, nausea, vomiting, or diarrhea. Will continue prn dulcolax. Given return precautions.

## 2014-12-08 NOTE — Assessment & Plan Note (Signed)
Will continue to follow with dialysis and renal. Will request renal records.

## 2014-12-08 NOTE — Assessment & Plan Note (Signed)
History of OSA. Concern in hospital for this given desat while sleeping. Normal range O2 sat now. Has home O2. Will obtain sleep study and refer to pulmonology.

## 2014-12-08 NOTE — Assessment & Plan Note (Signed)
Patient with LUE lymphedema relating to lymph node dissection. Breast and axilla exam today with no masses. Will refer to oncology for follow-up. Will refer to PT for lymph edema.

## 2014-12-09 ENCOUNTER — Encounter: Payer: Self-pay | Admitting: Vascular Surgery

## 2014-12-10 ENCOUNTER — Ambulatory Visit (INDEPENDENT_AMBULATORY_CARE_PROVIDER_SITE_OTHER): Payer: Self-pay | Admitting: Vascular Surgery

## 2014-12-10 ENCOUNTER — Encounter: Payer: Self-pay | Admitting: Vascular Surgery

## 2014-12-10 VITALS — BP 137/67 | HR 68 | Temp 98.2°F | Ht 65.0 in | Wt 271.0 lb

## 2014-12-10 DIAGNOSIS — Z992 Dependence on renal dialysis: Secondary | ICD-10-CM

## 2014-12-10 DIAGNOSIS — N186 End stage renal disease: Secondary | ICD-10-CM

## 2014-12-10 NOTE — Progress Notes (Signed)
    Postoperative Access Visit   History of Present Illness  Jordan [name] is a 74 y.o. year old female who presents for postoperative follow-up for: R BC AVF (Date: 10/26/14).  The patient's wounds are healed.  The patient notes no steal symptoms.  The patient is able to complete their activities of daily living.  The patient's current symptoms are: none.  For VQI Use Only  PRE-ADM LIVING: Home  AMB STATUS: Ambulatory  Physical Examination Filed Vitals:   12/10/14 0903  BP: 137/67  Pulse: 68  Temp: 98.2 F (36.8 C)    RUE: Incision is healed, skin feels warm, hand grip is 5/5, sensation in digits is intact, palpable thrill, bruit can be auscultated , on Sonosite: fistula >6 mm throughout, >1 cm deep in proximal 2/3, palpable radial pulse  Medical Decision Making  Jordan [name] is a 74 y.o. year old female who presents s/p R BC AVF.  The patient's access is ready for use but fistula is deep.   Patient willing to try cannulation attempt with best technician at her center prior to considering superficialization.  The patient's tunneled dialysis catheter can be removed after two successful cannulations and completed dialysis treatments.  Thank you for allowing [place] to participate in this patient's care.  Adele Barthel, MD [facility] Office: [phone] Pager: [phone]  12/10/2014, 9:36 AM

## 2014-12-23 ENCOUNTER — Inpatient Hospital Stay (HOSPITAL_COMMUNITY)
Admission: EM | Admit: 2014-12-23 | Discharge: 2014-12-29 | DRG: 286 | Disposition: A | Discharge status: Home Health | Payer: Medicare (Managed Care) | Attending: Internal Medicine | Admitting: Internal Medicine

## 2014-12-23 ENCOUNTER — Encounter (HOSPITAL_COMMUNITY): Payer: Self-pay

## 2014-12-23 ENCOUNTER — Emergency Department (HOSPITAL_COMMUNITY): Payer: Medicare (Managed Care)

## 2014-12-23 DIAGNOSIS — M1990 Unspecified osteoarthritis, unspecified site: Secondary | ICD-10-CM | POA: Diagnosis present

## 2014-12-23 DIAGNOSIS — K219 Gastro-esophageal reflux disease without esophagitis: Secondary | ICD-10-CM | POA: Diagnosis present

## 2014-12-23 DIAGNOSIS — I447 Left bundle-branch block, unspecified: Secondary | ICD-10-CM | POA: Diagnosis present

## 2014-12-23 DIAGNOSIS — K5900 Constipation, unspecified: Secondary | ICD-10-CM | POA: Diagnosis present

## 2014-12-23 DIAGNOSIS — E1122 Type 2 diabetes mellitus with diabetic chronic kidney disease: Secondary | ICD-10-CM | POA: Diagnosis present

## 2014-12-23 DIAGNOSIS — Y828 Other medical devices associated with adverse incidents: Secondary | ICD-10-CM | POA: Diagnosis present

## 2014-12-23 DIAGNOSIS — Z992 Dependence on renal dialysis: Secondary | ICD-10-CM | POA: Diagnosis not present

## 2014-12-23 DIAGNOSIS — I429 Cardiomyopathy, unspecified: Secondary | ICD-10-CM

## 2014-12-23 DIAGNOSIS — G9340 Encephalopathy, unspecified: Secondary | ICD-10-CM | POA: Diagnosis present

## 2014-12-23 DIAGNOSIS — N390 Urinary tract infection, site not specified: Secondary | ICD-10-CM | POA: Diagnosis not present

## 2014-12-23 DIAGNOSIS — B952 Enterococcus as the cause of diseases classified elsewhere: Secondary | ICD-10-CM | POA: Diagnosis not present

## 2014-12-23 DIAGNOSIS — I252 Old myocardial infarction: Secondary | ICD-10-CM | POA: Diagnosis not present

## 2014-12-23 DIAGNOSIS — J449 Chronic obstructive pulmonary disease, unspecified: Secondary | ICD-10-CM | POA: Diagnosis present

## 2014-12-23 DIAGNOSIS — I890 Lymphedema, not elsewhere classified: Secondary | ICD-10-CM | POA: Diagnosis present

## 2014-12-23 DIAGNOSIS — R7881 Bacteremia: Secondary | ICD-10-CM | POA: Diagnosis not present

## 2014-12-23 DIAGNOSIS — Z794 Long term (current) use of insulin: Secondary | ICD-10-CM | POA: Diagnosis not present

## 2014-12-23 DIAGNOSIS — E1121 Type 2 diabetes mellitus with diabetic nephropathy: Secondary | ICD-10-CM | POA: Diagnosis present

## 2014-12-23 DIAGNOSIS — A4181 Sepsis due to Enterococcus: Secondary | ICD-10-CM | POA: Diagnosis present

## 2014-12-23 DIAGNOSIS — T827XXA Infection and inflammatory reaction due to other cardiac and vascular devices, implants and grafts, initial encounter: Principal | ICD-10-CM | POA: Diagnosis present

## 2014-12-23 DIAGNOSIS — Z6841 Body Mass Index (BMI) 40.0 and over, adult: Secondary | ICD-10-CM | POA: Diagnosis not present

## 2014-12-23 DIAGNOSIS — Z853 Personal history of malignant neoplasm of breast: Secondary | ICD-10-CM | POA: Diagnosis not present

## 2014-12-23 DIAGNOSIS — Z9012 Acquired absence of left breast and nipple: Secondary | ICD-10-CM | POA: Diagnosis present

## 2014-12-23 DIAGNOSIS — I5022 Chronic systolic (congestive) heart failure: Secondary | ICD-10-CM | POA: Diagnosis present

## 2014-12-23 DIAGNOSIS — I10 Essential (primary) hypertension: Secondary | ICD-10-CM | POA: Diagnosis not present

## 2014-12-23 DIAGNOSIS — I120 Hypertensive chronic kidney disease with stage 5 chronic kidney disease or end stage renal disease: Secondary | ICD-10-CM | POA: Diagnosis present

## 2014-12-23 DIAGNOSIS — G4733 Obstructive sleep apnea (adult) (pediatric): Secondary | ICD-10-CM | POA: Diagnosis present

## 2014-12-23 DIAGNOSIS — B961 Klebsiella pneumoniae [K. pneumoniae] as the cause of diseases classified elsewhere: Secondary | ICD-10-CM | POA: Diagnosis present

## 2014-12-23 DIAGNOSIS — D509 Iron deficiency anemia, unspecified: Secondary | ICD-10-CM | POA: Diagnosis present

## 2014-12-23 DIAGNOSIS — I428 Other cardiomyopathies: Secondary | ICD-10-CM | POA: Diagnosis present

## 2014-12-23 DIAGNOSIS — G9341 Metabolic encephalopathy: Secondary | ICD-10-CM | POA: Diagnosis present

## 2014-12-23 DIAGNOSIS — C50412 Malignant neoplasm of upper-outer quadrant of left female breast: Secondary | ICD-10-CM | POA: Diagnosis present

## 2014-12-23 DIAGNOSIS — R404 Transient alteration of awareness: Secondary | ICD-10-CM | POA: Diagnosis not present

## 2014-12-23 DIAGNOSIS — Z79899 Other long term (current) drug therapy: Secondary | ICD-10-CM | POA: Diagnosis not present

## 2014-12-23 DIAGNOSIS — E11649 Type 2 diabetes mellitus with hypoglycemia without coma: Secondary | ICD-10-CM | POA: Diagnosis present

## 2014-12-23 DIAGNOSIS — R110 Nausea: Secondary | ICD-10-CM | POA: Diagnosis not present

## 2014-12-23 DIAGNOSIS — R509 Fever, unspecified: Secondary | ICD-10-CM | POA: Diagnosis not present

## 2014-12-23 DIAGNOSIS — Z833 Family history of diabetes mellitus: Secondary | ICD-10-CM | POA: Diagnosis not present

## 2014-12-23 DIAGNOSIS — Z8249 Family history of ischemic heart disease and other diseases of the circulatory system: Secondary | ICD-10-CM | POA: Diagnosis not present

## 2014-12-23 DIAGNOSIS — D638 Anemia in other chronic diseases classified elsewhere: Secondary | ICD-10-CM | POA: Diagnosis present

## 2014-12-23 DIAGNOSIS — E119 Type 2 diabetes mellitus without complications: Secondary | ICD-10-CM | POA: Diagnosis present

## 2014-12-23 DIAGNOSIS — N186 End stage renal disease: Secondary | ICD-10-CM | POA: Diagnosis present

## 2014-12-23 HISTORY — DX: Type 2 diabetes mellitus with diabetic chronic kidney disease: E11.22

## 2014-12-23 HISTORY — DX: End stage renal disease: N18.6

## 2014-12-23 HISTORY — DX: Chronic systolic (congestive) heart failure: I50.22

## 2014-12-23 LAB — URINE MICROSCOPIC-ADD ON

## 2014-12-23 LAB — URINALYSIS, ROUTINE W REFLEX MICROSCOPIC
GLUCOSE, UA: NEGATIVE mg/dL
Ketones, ur: 15 mg/dL — AB
Nitrite: NEGATIVE
Protein, ur: 100 mg/dL — AB
SPECIFIC GRAVITY, URINE: 1.019 (ref 1.005–1.030)
Urobilinogen, UA: 0.2 mg/dL (ref 0.0–1.0)
pH: 5 (ref 5.0–8.0)

## 2014-12-23 LAB — BASIC METABOLIC PANEL
ANION GAP: 13 (ref 5–15)
BUN: 62 mg/dL — ABNORMAL HIGH (ref 6–20)
CALCIUM: 8.9 mg/dL (ref 8.9–10.3)
CO2: 24 mmol/L (ref 22–32)
Chloride: 97 mmol/L — ABNORMAL LOW (ref 101–111)
Creatinine, Ser: 9.26 mg/dL — ABNORMAL HIGH (ref 0.44–1.00)
GFR calc Af Amer: 4 mL/min — ABNORMAL LOW (ref >60)
GFR calc non Af Amer: 4 mL/min — ABNORMAL LOW (ref >60)
GLUCOSE: 235 mg/dL — AB (ref 65–99)
POTASSIUM: 4.7 mmol/L (ref 3.5–5.1)
Sodium: 134 mmol/L — ABNORMAL LOW (ref 135–145)

## 2014-12-23 LAB — I-STAT TROPONIN, ED: Troponin i, poc: 0.07 ng/mL (ref 0.00–0.08)

## 2014-12-23 LAB — MAGNESIUM: MAGNESIUM: 2 mg/dL (ref 1.7–2.4)

## 2014-12-23 LAB — CBG MONITORING, ED
GLUCOSE-CAPILLARY: 207 mg/dL — AB (ref 65–99)
Glucose-Capillary: 219 mg/dL — ABNORMAL HIGH (ref 65–99)
Glucose-Capillary: 184 mg/dL — ABNORMAL HIGH (ref 65–99)

## 2014-12-23 LAB — I-STAT CREATININE, ED: Creatinine, Ser: 9.1 mg/dL — ABNORMAL HIGH (ref 0.44–1.00)

## 2014-12-23 LAB — CBC
HCT: 36 % (ref 36.0–46.0)
Hemoglobin: 11 g/dL — ABNORMAL LOW (ref 12.0–15.0)
MCH: 25.2 pg — ABNORMAL LOW (ref 26.0–34.0)
MCHC: 30.6 g/dL (ref 30.0–36.0)
MCV: 82.4 fL (ref 78.0–100.0)
Platelets: 232 10*3/uL (ref 150–400)
RBC: 4.37 MIL/uL (ref 3.87–5.11)
RDW: 16.1 % — ABNORMAL HIGH (ref 11.5–15.5)
WBC: 14.1 10*3/uL — ABNORMAL HIGH (ref 4.0–10.5)

## 2014-12-23 LAB — GLUCOSE, CAPILLARY
GLUCOSE-CAPILLARY: 158 mg/dL — AB (ref 65–99)
GLUCOSE-CAPILLARY: 196 mg/dL — AB (ref 65–99)

## 2014-12-23 LAB — I-STAT CG4 LACTIC ACID, ED: LACTIC ACID, VENOUS: 1.31 mmol/L (ref 0.5–2.0)

## 2014-12-23 MED ORDER — VANCOMYCIN HCL IN DEXTROSE 1-5 GM/200ML-% IV SOLN
1000.0000 mg | INTRAVENOUS | Status: DC
  Administered 2014-12-25: 1000 mg via INTRAVENOUS
  Filled 2014-12-23 (×2): qty 200

## 2014-12-23 MED ORDER — ACETAMINOPHEN 325 MG PO TABS
650.0000 mg | ORAL_TABLET | ORAL | Status: DC | PRN
  Administered 2014-12-23 – 2014-12-27 (×2): 650 mg via ORAL
  Filled 2014-12-23 (×2): qty 2

## 2014-12-23 MED ORDER — DOCUSATE SODIUM 283 MG RE ENEM
1.0000 | ENEMA | RECTAL | Status: DC | PRN
Start: 2014-12-23 — End: 2014-12-29

## 2014-12-23 MED ORDER — ONDANSETRON HCL 4 MG PO TABS: 4.0000 mg | ORAL_TABLET | Freq: Four times a day (QID) | ORAL | Status: DC | PRN

## 2014-12-23 MED ORDER — ALTEPLASE 2 MG IJ SOLR
2.0000 mg | Freq: Once | INTRAMUSCULAR | Status: DC | PRN
  Filled 2014-12-23: qty 2

## 2014-12-23 MED ORDER — PENTAFLUOROPROP-TETRAFLUOROETH EX AERO: 1.0000 "application " | INHALATION_SPRAY | CUTANEOUS | Status: DC | PRN

## 2014-12-23 MED ORDER — LIDOCAINE-PRILOCAINE 2.5-2.5 % EX CREA
1.0000 "application " | TOPICAL_CREAM | CUTANEOUS | Status: DC | PRN
  Filled 2014-12-23: qty 5

## 2014-12-23 MED ORDER — INSULIN ASPART 100 UNIT/ML ~~LOC~~ SOLN
0.0000 [IU] | Freq: Three times a day (TID) | SUBCUTANEOUS | Status: DC
  Administered 2014-12-23: 2 [IU] via SUBCUTANEOUS
  Administered 2014-12-24: 1 [IU] via SUBCUTANEOUS
  Administered 2014-12-24: 7 [IU] via SUBCUTANEOUS
  Administered 2014-12-24: 2 [IU] via SUBCUTANEOUS
  Administered 2014-12-25 – 2014-12-26 (×4): 1 [IU] via SUBCUTANEOUS
  Administered 2014-12-26 – 2014-12-27 (×2): 2 [IU] via SUBCUTANEOUS
  Filled 2014-12-23: qty 1

## 2014-12-23 MED ORDER — CAMPHOR-MENTHOL 0.5-0.5 % EX LOTN: 1.0000 "application " | TOPICAL_LOTION | Freq: Three times a day (TID) | CUTANEOUS | Status: DC | PRN

## 2014-12-23 MED ORDER — DEXTROSE 5 % IV SOLN
1.0000 g | INTRAVENOUS | Status: DC
  Administered 2014-12-23: 1 g via INTRAVENOUS
  Filled 2014-12-23 (×2): qty 10

## 2014-12-23 MED ORDER — ALBUTEROL SULFATE (2.5 MG/3ML) 0.083% IN NEBU: 2.5000 mg | INHALATION_SOLUTION | RESPIRATORY_TRACT | Status: DC | PRN

## 2014-12-23 MED ORDER — HYDROXYZINE HCL 25 MG PO TABS: 25.0000 mg | ORAL_TABLET | Freq: Three times a day (TID) | ORAL | Status: DC | PRN

## 2014-12-23 MED ORDER — ONDANSETRON HCL 4 MG/2ML IJ SOLN
4.0000 mg | Freq: Four times a day (QID) | INTRAMUSCULAR | Status: DC | PRN
  Administered 2014-12-24: 4 mg via INTRAVENOUS
  Filled 2014-12-23: qty 2

## 2014-12-23 MED ORDER — SODIUM CHLORIDE 0.9 % IV SOLN: 100.0000 mL | INTRAVENOUS | Status: DC | PRN

## 2014-12-23 MED ORDER — HEPARIN SODIUM (PORCINE) 1000 UNIT/ML DIALYSIS: 1000.0000 [IU] | INTRAMUSCULAR | Status: DC | PRN

## 2014-12-23 MED ORDER — VANCOMYCIN HCL IN DEXTROSE 1-5 GM/200ML-% IV SOLN
1000.0000 mg | Freq: Once | INTRAVENOUS | Status: AC
  Administered 2014-12-23: 1000 mg via INTRAVENOUS
  Filled 2014-12-23: qty 200

## 2014-12-23 MED ORDER — SODIUM CHLORIDE 0.9 % IV SOLN: 250.0000 mL | INTRAVENOUS | Status: DC | PRN

## 2014-12-23 MED ORDER — ZOLPIDEM TARTRATE 5 MG PO TABS
5.0000 mg | ORAL_TABLET | Freq: Every evening | ORAL | Status: DC | PRN
  Filled 2014-12-23: qty 1

## 2014-12-23 MED ORDER — PIPERACILLIN-TAZOBACTAM 3.375 G IVPB
3.3750 g | Freq: Once | INTRAVENOUS | Status: AC
  Administered 2014-12-23: 3.375 g via INTRAVENOUS
  Filled 2014-12-23: qty 50

## 2014-12-23 MED ORDER — CALCIUM CARBONATE 1250 MG/5ML PO SUSP
500.0000 mg | Freq: Four times a day (QID) | ORAL | Status: DC | PRN
  Filled 2014-12-23: qty 5

## 2014-12-23 MED ORDER — CALCITRIOL 0.5 MCG PO CAPS
0.7500 ug | ORAL_CAPSULE | ORAL | Status: DC
  Administered 2014-12-23 – 2014-12-29 (×3): 0.75 ug via ORAL
  Filled 2014-12-23 (×5): qty 1

## 2014-12-23 MED ORDER — SODIUM CHLORIDE 0.9 % IJ SOLN: 3.0000 mL | INTRAMUSCULAR | Status: DC | PRN

## 2014-12-23 MED ORDER — SODIUM CHLORIDE 0.9 % IJ SOLN
3.0000 mL | Freq: Two times a day (BID) | INTRAMUSCULAR | Status: DC
  Administered 2014-12-23 – 2014-12-24 (×2): 3 mL via INTRAVENOUS
  Administered 2014-12-24: 10 mL via INTRAVENOUS
  Administered 2014-12-25 – 2014-12-29 (×7): 3 mL via INTRAVENOUS

## 2014-12-23 MED ORDER — SORBITOL 70 % SOLN
30.0000 mL | Status: DC | PRN
Start: 2014-12-23 — End: 2014-12-29
  Filled 2014-12-23: qty 30

## 2014-12-23 MED ORDER — ONDANSETRON HCL 4 MG/2ML IJ SOLN: 4.0000 mg | Freq: Four times a day (QID) | INTRAMUSCULAR | Status: DC | PRN

## 2014-12-23 MED ORDER — HEPARIN SODIUM (PORCINE) 1000 UNIT/ML DIALYSIS: 20.0000 [IU]/kg | INTRAMUSCULAR | Status: DC | PRN

## 2014-12-23 MED ORDER — CARVEDILOL 6.25 MG PO TABS
6.2500 mg | ORAL_TABLET | Freq: Two times a day (BID) | ORAL | Status: DC
  Administered 2014-12-23 – 2014-12-28 (×11): 6.25 mg via ORAL
  Filled 2014-12-23 (×11): qty 1

## 2014-12-23 MED ORDER — VANCOMYCIN HCL 1000 MG IV SOLR
2000.0000 mg | Freq: Once | INTRAVENOUS | Status: AC
  Administered 2014-12-23: 2000 mg via INTRAVENOUS
  Filled 2014-12-23: qty 2000

## 2014-12-23 MED ORDER — LIDOCAINE HCL (PF) 1 % IJ SOLN: 5.0000 mL | INTRAMUSCULAR | Status: DC | PRN

## 2014-12-23 MED ORDER — ACETAMINOPHEN 325 MG PO TABS
650.0000 mg | ORAL_TABLET | Freq: Once | ORAL | Status: AC
  Administered 2014-12-23: 650 mg via ORAL
  Filled 2014-12-23: qty 2

## 2014-12-23 MED ORDER — IPRATROPIUM BROMIDE 0.02 % IN SOLN: 0.5000 mg | RESPIRATORY_TRACT | Status: DC | PRN

## 2014-12-23 MED ORDER — INSULIN DETEMIR 100 UNIT/ML ~~LOC~~ SOLN
70.0000 [IU] | SUBCUTANEOUS | Status: DC
  Administered 2014-12-24 – 2014-12-28 (×4): 70 [IU] via SUBCUTANEOUS
  Filled 2014-12-23 (×8): qty 0.7

## 2014-12-23 MED ORDER — HEPARIN SODIUM (PORCINE) 5000 UNIT/ML IJ SOLN
5000.0000 [IU] | Freq: Three times a day (TID) | INTRAMUSCULAR | Status: DC
  Administered 2014-12-23 – 2014-12-27 (×10): 5000 [IU] via SUBCUTANEOUS
  Filled 2014-12-23 (×9): qty 1

## 2014-12-23 MED ORDER — NEPRO/CARBSTEADY PO LIQD
237.0000 mL | Freq: Three times a day (TID) | ORAL | Status: DC | PRN
  Filled 2014-12-23: qty 237

## 2014-12-23 NOTE — Procedures (Signed)
  I was present at this dialysis session, have reviewed the session itself and made  appropriate changes Rob Shaylynn Nulty MD (pgr) [phone]    (c) [phone] 10/02/2014, 10:31 AM    

## 2014-12-23 NOTE — Consult Note (Signed)
[facility] Renal Consultation Note    Indication for Consultation:  Management of ESRD/hemodialysis; anemia, hypertension/volume and secondary hyperparathyroidism PCP: Dr. [name], [facility]  HPI: Jordan [name] is a 74 y.o. 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  She lives with 35 year old grandson. Last night she felt a tightness in neck and headache that was unusual for her as well as N and V, but no diarrhea.  EKG showed NSR with LBB earlier today with transient bradycardia in the 40s up to 60s now. Initial troponin was 0.07 She makes little urine but has urgency and dysuria. She recently finished treatment for a yeast infection per HD notes. She complains of being cold today on HD. She requires a suppository at times due to intermittent constipation. She ambulates with a walker at home. She has been dialyzing with 1: 1 with AVF.  According to Dr. Adin Hector notes at dialysis, she has been have troubles with intradialytic hypotension and he suggested trying profile 2 and consider Na modeling. She came off HD early on 9/6 due to [place], but sitting BP was up to 200 at rinseback. Evaluation in the ED showed WBC 14.1, CXR neg for infiltrate/volume Head CT neg acute change, urine TNTC WBC, large WBC, 7-10 RBC, many bacteria, neg nitrite.  She is due for HD today.  Past Medical History  Diagnosis Date  . Breast cancer   . Diabetes mellitus without complication   . Hypertension   . Arthritis   . COPD (chronic obstructive pulmonary disease)   . Renal disorder     Family reports acute renal failure  . GERD (gastroesophageal reflux disease)   . CKD (chronic kidney disease)   . Systolic CHF, chronic [id number]   Past Surgical History  Procedure  Laterality Date  . Breast surgery Left   . Cardiac catheterization [name] 10/19/2014    Procedure: Right/Left Heart Cath and Coronary Angiography;  Surgeon: Peter M Martinique, MD;  Location: [place] [facility];  Service: Cardiovascular;  Laterality: [name];  . Av fistula placement Right 10/26/2014    Procedure: Right Brachiocephalic Arteriovenous FISTULA CREATION;  Surgeon: Conrad Norton, MD;  Location: [place];  Service: Vascular;  Laterality: Right;   Family History  Problem Relation Age of Onset  . Diabetes Mother   . Hypertension Mother   . Stroke Sister   . Hypertension Sister   . Heart attack Neg Hx    Social History:  reports that she has never smoked. She does not have any smokeless tobacco history on file. She reports that she does not drink alcohol. Her drug history is not on file. No Known Allergies Prior to Admission medications   Medication Sig Start Date End Date Taking? Authorizing Provider  carvedilol (COREG) 6.25 MG tablet Take 1 tablet (6.25 mg total) by mouth 2 (two) times daily with a meal. 10/27/14   Barton Dubois, MD  gabapentin (NEURONTIN) 300 MG capsule Take 1 capsule (300 mg total) by mouth 2 (two) times daily. 11/10/14   Dayna N Dunn, PA-C  Insulin Detemir (LEVEMIR FLEXPEN) 100 UNIT/ML Pen Inject 70 Units into the skin every morning.    Historical Provider, MD  insulin lispro (HUMALOG KWIKPEN) 100 UNIT/ML KiwkPen Inject 1-5 Units into the skin 3 (three)  times daily as needed (blood sugar).     Historical Provider, MD  Multiple Vitamins-Minerals (CENTRUM SILVER ADULT 50+ PO) Take 1 tablet by mouth daily.    Historical Provider, MD   Current Facility-Administered Medications  Medication Dose Route Frequency Provider Last Rate Last Dose  . 0.9 %  sodium chloride infusion  100 mL Intravenous PRN Alric Seton, PA-C      . 0.9 %  sodium chloride infusion  100 mL Intravenous PRN Alric Seton, PA-C      . alteplase (CATHFLO ACTIVASE) injection 2 mg  2 mg Intracatheter Once PRN  Alric Seton, PA-C      . calcitRIOL (ROCALTROL) capsule 0.75 mcg  0.75 mcg Oral Q T,Th,Sa-HD Alric Seton, PA-C   0.75 mcg at 12/23/14 1213  . calcium carbonate (dosed in mg elemental calcium) suspension 500 mg of elemental calcium  500 mg of elemental calcium Oral Q6H PRN Eugenie Filler, MD      . camphor-menthol [facility]) lotion 1 application  1 application Topical [id number] PRN Eugenie Filler, MD       And  . hydrOXYzine (ATARAX/VISTARIL) tablet 25 mg  25 mg Oral Q8H PRN Eugenie Filler, MD      . docusate sodium [facility]) enema 283 mg  1 enema Rectal PRN Eugenie Filler, MD      . feeding supplement (NEPRO CARB STEADY) liquid 237 mL  237 mL Oral TID PRN Eugenie Filler, MD      . heparin injection 1,000 Units  1,000 Units Dialysis PRN Alric Seton, PA-C      . heparin injection 2,500 Units  20 Units/kg Dialysis PRN Alric Seton, PA-C      . insulin aspart (novoLOG) injection 0-9 Units  0-9 Units Subcutaneous TID WC Eugenie Filler, MD   2 Units at 12/23/14 1136  . lidocaine (PF) (XYLOCAINE) 1 % injection 5 mL  5 mL Intradermal PRN Alric Seton, PA-C      . lidocaine-prilocaine (EMLA) cream 1 application  1 application Topical PRN Alric Seton, PA-C      . ondansetron [facility]) tablet 4 mg  4 mg Oral Q6H PRN Eugenie Filler, MD       Or  . ondansetron [facility]) injection 4 mg  4 mg Intravenous Q6H PRN Eugenie Filler, MD      . pentafluoroprop-tetrafluoroeth (GEBAUERS) aerosol 1 application  1 application Topical PRN Alric Seton, PA-C      . sorbitol 70 % solution 30 mL  30 mL Oral PRN Eugenie Filler, MD      . zolpidem (AMBIEN) tablet 5 mg  5 mg Oral QHS PRN Eugenie Filler, MD       Current Outpatient Prescriptions  Medication Sig Dispense Refill  . carvedilol (COREG) 6.25 MG tablet Take 1 tablet (6.25 mg total) by mouth 2 (two) times daily with a meal. 60 tablet 1  . gabapentin (NEURONTIN) 300 MG capsule Take 1 capsule (300 mg total) by mouth 2 (two) times  daily. 60 capsule 6  . Insulin Detemir (LEVEMIR FLEXPEN) 100 UNIT/ML Pen Inject 70 Units into the skin every morning.    . insulin lispro (HUMALOG KWIKPEN) 100 UNIT/ML KiwkPen Inject 1-5 Units into the skin 3 (three) times daily as needed (blood sugar).     . Multiple Vitamins-Minerals (CENTRUM SILVER ADULT 50+ PO) Take 1 tablet by mouth daily.     Labs: Basic Metabolic Panel:  Recent Labs Lab 12/23/14 0900 12/23/14 0914  NA 134*  --  K 4.7  --   CL 97*  --   CO2 24  --   GLUCOSE 235*  --   BUN 62*  --   CREATININE 9.26* 9.10*  CALCIUM 8.9  --   CBC:  Recent Labs Lab 12/23/14 0900  WBC 14.1*  HGB 11.0*  HCT 36.0  MCV 82.4  PLT 232   CBG:  Recent Labs Lab 12/23/14 0900 12/23/14 1130 12/23/14 1214  GLUCAP 219* 184* 207*   Studies/Results: Ct Head Wo Contrast  12/23/2014   CLINICAL DATA:  Left facial droop, generalized weakness and slurred speech.  EXAM: CT HEAD WITHOUT CONTRAST  TECHNIQUE: Contiguous axial images were obtained from the base of the skull through the vertex without intravenous contrast.  COMPARISON:  None.  FINDINGS: The brain appears normal without hemorrhage, infarct, mass lesion, mass effect, midline shift or abnormal extra-axial fluid collection. No hydrocephalus or pneumocephalus. The calvarium is intact. Imaged paranasal sinuses and mastoid air cells are clear.  IMPRESSION: Negative exam.  These results were called by telephone at the time of interpretation on 12/23/2014 at 8:58 am to Dr. Janann Colonel, who verbally acknowledged these results.   Electronically Signed   By: Inge Rise M.D.   On: 12/23/2014 09:02   Dg Chest Port 1 View  12/23/2014   CLINICAL DATA:  Fever.  EXAM: PORTABLE CHEST - 1 VIEW  COMPARISON:  10/15/2014  FINDINGS: There has been an interval insertion of dual lumen central venous catheter from right internal jugular approach, tip overlying expected location of the right atrium.  Cardiomediastinal silhouette is stably enlarged.  There is  no evidence of pneumothorax. Left hemidiaphragm is obscured which may be due to pleural effusion and atelectasis or airspace consolidation. Previously noted widespread airspace opacities have improved.  Osseous structures are without acute abnormality. Soft tissues are grossly normal.  IMPRESSION: Overall improved aeration of the lungs, with residual left lower lung opacity, which may represent pleural effusion with atelectasis, or focal airspace consolidation.  Stable cardiomegaly.   Electronically Signed   By: Fidela Salisbury M.D.   On: 12/23/2014 09:34   ROS: As per HPI otherwise negative.  Physical Exam: on HD leg BP variable goal 2 L- no weights pre HD Filed Vitals:   12/23/14 1200 12/23/14 1300 12/23/14 1305 12/23/14 1330  BP: 117/90 109/40 114/43 120/54  Pulse: 40 46 46 66  Temp:  99.2 F (37.3 C)    TempSrc:  Oral    Resp: 11 16 18    SpO2: 93% 95%       General: Morbidly obese chronically ill appearing AAF, chilling some Head: Normocephalic, atraumatic, sclera non-icteric, mucus membranes are moist Neck: Supple. JVD not elevated. Lungs: dim BS, no obvious rales, exam limited by extreme obesity Heart: RRR  Abdomen: Soft, non-tender, non-distended massive pannus Extremities:  Tr LE  edema or ischemic changes, no open wounds , chronic left arm lymphedema Neuro: Alert and oriented X 3. Moves all extremities spontaneously. Psych:  Responds to questions appropriately with a normal affect. Dialysis Access: right upper AVF + bruit and right IJ  Dialysis Orders:  [facility] 4.25 180 400/800 EDW 123 3 K 2.5 Ca profile 2 heparin 13,000 venofer 100 through 9/20 Mir[place] 100 q 2 weeks last given 9/6 calcitriol 0.75 Recent: Hgb 10.8 9/1 13% sat iPH 327 have been using 1 needle in AVF  The past 3 treatments at Qb 250  Assessment/Plan: 1.  AMS/febrile illness - MS/speech has cleared r; still doesn't feel well, cultures pending,  CXR neg infiltrate; on empiric IV zosyn; She has a IJ perm cath for  HD which places her at high risk for bactermia- she was given a 1 gm dose of Vanc this am, needs redosing - asked pharmacy to dose as her loading dose was low given her size 2. ESRD -  TTS - K 5.1 - normally on 3 K bath but K up to 5.1 probably because on reduced QB due to using 1 needle in AVF plus didn't have full HD Tuesday due to BP drop - when orders were written, didn't know we were using AVF - plan to use AVF on Saturday 1: 1 with Qb 250. Using profile 2 today with goal 2 L today. 3. Hypertension/volume  - BP here variable - now up to 160 with leg BP; HD at outpt unit run 150 - 200 pre and post with low gains. Echo 11/2014 showed EF [id number] normal systolic function.  She really needs to have standing pressures, which she cannot do with leg BP (has AVF in right and left arm IV. Given morbid obesity, left lymphedema, it is difficult to assess volume status; on coreg 6.25 bid at home  4. Anemia  - Hgb stable 11 - just dosed 9/6 5. Metabolic bone disease -  Continue calcitriol; not on binder or sensipar 6. Nutrition - morbidly obese.  EDW lowered some since starting HD, but most decrease likely to volume removal 7. Hx breast cancer s/p left mastectomy - chronic left arm lymphedema 8. NICM  EF 30%, recent NSTEMI low dose BB - on hold 9. DM 10. HX OSA - referred to pul 8/24 by PCP- has used CPAP in the past  [name], PA-C [place] [phone] 12/23/2014, 2:25 PM   Pt seen, examined and agree w A/P as above.  Kelly Splinter MD pager [phone]    cell [phone] 12/23/2014, 5:00 PM

## 2014-12-23 NOTE — Consult Note (Signed)
Stroke Consult    Chief Complaint: altered mental status  HPI: Jordan [name] is an 74 y.o. female hx of breast CA, DM, HTN, CKD on HD presenting as code stroke with confusion and speech difficulties. Per grandson, LSW 0200, around 0600 he noted she seemed confused and was having difficulty talking. Symptoms did not improve so he brought her to the ED. Currently he feels she is improved but not back to her baseline.   CT head imaging reviewed and overall unremarkable. Labs pertinent for Cr 9.10, WBC 14.1, temperature 101.1.   Date last known well: 12/23/2014 Time last known well: 02 tPA Given: no, outside tPA window Modified Rankin: Rankin Score=3  Past Medical History  Diagnosis Date  . Breast cancer   . Diabetes mellitus without complication   . Hypertension   . Arthritis   . COPD (chronic obstructive pulmonary disease)   . Renal disorder     Family reports acute renal failure  . GERD (gastroesophageal reflux disease)   . CKD (chronic kidney disease)     Past Surgical History  Procedure Laterality Date  . Breast surgery Left   . Cardiac catheterization [name] 10/19/2014    Procedure: Right/Left Heart Cath and Coronary Angiography;  Surgeon: Reve Crocket M Martinique, MD;  Location: [place] [facility];  Service: Cardiovascular;  Laterality: [name];  . Av fistula placement Right 10/26/2014    Procedure: Right Brachiocephalic Arteriovenous FISTULA CREATION;  Surgeon: Conrad Mount Briar, MD;  Location: [place];  Service: Vascular;  Laterality: Right;    Family History  Problem Relation Age of Onset  . Diabetes Mother   . Hypertension Mother   . Stroke Sister   . Hypertension Sister   . Heart attack Neg Hx    Social History:  reports that she has never smoked. She does not have any smokeless tobacco history on file. She reports that she does not drink alcohol. Her drug history is not on file.  Allergies: No Known Allergies   (Not in a hospital admission)  ROS: Out of a complete 14 system  review, the patient complains of only the following symptoms, and all other reviewed systems are negative.    Physical Examination: Filed Vitals:   12/23/14 0830  BP: 152/44  Pulse: 66  Temp: 101.1 F (38.4 C)  Resp: 25   Physical Exam  Constitutional: He appears well-developed and well-nourished.  Psych: Affect appropriate to situation Eyes: No scleral injection HENT: No OP obstrucion Head: Normocephalic.  Cardiovascular: Normal rate and regular rhythm.  Respiratory: Effort normal and breath sounds normal.  GI: Soft. Bowel sounds are normal. No distension. There is no tenderness.  Skin: edematous bilateral LE   Neurologic Examination: Mental Status: Alert, oriented, thought content appropriate.  Slightly tremor in speech but no signs of aphasia. No dysarthria.  Able to follow 3 step commands without difficulty. Cranial Nerves: II: funduscopic exam wnl bilaterally, visual fields grossly normal, pupils equal, round, reactive to light and accommodation III,IV, VI: ptosis not present, extra-ocular motions intact bilaterally V,VII: smile symmetric, facial light touch sensation normal bilaterally VIII: hearing normal bilaterally IX,X: gag reflex present XI: trapezius strength/neck flexion strength normal bilaterally XII: tongue strength normal  Motor: Right : Upper extremity    Left:     Upper extremity 5/5 deltoid       5/5 deltoid 5/5 biceps      5/5 biceps  5/5 triceps      5/5 triceps 5/5 hand grip      5/5  hand grip  Lower extremity     Lower extremity 4-/5 hip flexor      4-/5 hip flexor 4/5 quadricep      4/5 quadriceps  4/5 hamstrings     4/5 hamstrings 5-/5 plantar flexion       5-/5 plantar flexion 5-/5 plantar extension     5-/5 plantar extension Tone and bulk:normal tone throughout; no atrophy noted Sensory: diminished symmetric bilateral LE LT and pinprick Deep Tendon Reflexes: 1+ and symmetric throughout UE, absent patellar and  achilles Plantars: Right: downgoing   Left: downgoing Cerebellar: normal finger-to-nose, unable to test HTS Gait: deferred  Laboratory Studies:   Basic Metabolic Panel:  Recent Labs Lab 12/23/14 0914  CREATININE 9.10*    Liver Function Tests: No results for input(s): AST, ALT, ALKPHOS, BILITOT, PROT, ALBUMIN in the last 168 hours. No results for input(s): LIPASE, AMYLASE in the last 168 hours. No results for input(s): AMMONIA in the last 168 hours.  CBC:  Recent Labs Lab 12/23/14 0900  WBC 14.1*  HGB 11.0*  HCT 36.0  MCV 82.4  PLT 232    Cardiac Enzymes: No results for input(s): CKTOTAL, CKMB, CKMBINDEX, TROPONINI in the last 168 hours.  BNP: Invalid input(s): POCBNP  CBG:  Recent Labs Lab 12/23/14 0900  GLUCAP 70*    Microbiology: Results for orders placed or performed during the hospital encounter of 10/12/14  MRSA PCR Screening     Status: None   Collection Time: 10/12/14  6:42 AM  Result Value Ref Range Status   MRSA by PCR NEGATIVE NEGATIVE Final    Comment:        The GeneXpert MRSA Assay (FDA approved for NASAL specimens only), is one component of a comprehensive MRSA colonization surveillance program. It is not intended to diagnose MRSA infection nor to guide or monitor treatment for MRSA infections.     Coagulation Studies: No results for input(s): LABPROT, INR in the last 72 hours.  Urinalysis: No results for input(s): COLORURINE, LABSPEC, PHURINE, GLUCOSEU, HGBUR, BILIRUBINUR, KETONESUR, PROTEINUR, UROBILINOGEN, NITRITE, LEUKOCYTESUR in the last 168 hours.  Invalid input(s): APPERANCEUR  Lipid Panel:     Component Value Date/Time   CHOL 199 10/12/2014 0745   TRIG 113 10/12/2014 0745   HDL 53 10/12/2014 0745   CHOLHDL 3.8 10/12/2014 0745   VLDL 23 10/12/2014 0745   LDLCALC 123* 10/12/2014 0745    HgbA1C:  Lab Results  Component Value Date   HGBA1C 9.4* 10/12/2014    Urine Drug Screen:  No results found for: LABOPIA,  COCAINSCRNUR, LABBENZ, AMPHETMU, THCU, LABBARB  Alcohol Level: No results for input(s): ETH in the last 168 hours.  Other results:  Imaging: Ct Head Wo Contrast  12/23/2014   CLINICAL DATA:  Left facial droop, generalized weakness and slurred speech.  EXAM: CT HEAD WITHOUT CONTRAST  TECHNIQUE: Contiguous axial images were obtained from the base of the skull through the vertex without intravenous contrast.  COMPARISON:  None.  FINDINGS: The brain appears normal without hemorrhage, infarct, mass lesion, mass effect, midline shift or abnormal extra-axial fluid collection. No hydrocephalus or pneumocephalus. The calvarium is intact. Imaged paranasal sinuses and mastoid air cells are clear.  IMPRESSION: Negative exam.  These results were called by telephone at the time of interpretation on 12/23/2014 at 8:58 am to Dr. Janann Colonel, who verbally acknowledged these results.   Electronically Signed   By: Inge Rise M.D.   On: 12/23/2014 09:02    Assessment: 74 y.o. female hx of breast  CA, DM, HTN, CKD on HD presenting as code stroke with confusion and speech difficulties. Out of tPA window. Exam appears non-focal. Low suspicion that this represents a CVA. Suspect metabolic vs infectious etiology of her presentation.   Plan: 1. Check UA, UDS, blood culture, chest X-ray 2. B12, TSH, ammonia 3. Infectious workup and treatment per primary team 4. Would hold on MRI brain at this time as low suspicion for CVA 5. Will follow as needed   Jim Like, DO [facility] [phone]  If 7pm- 7am, please page neurology on call as listed in AMION. 12/23/2014, 9:24 AM

## 2014-12-23 NOTE — Code Documentation (Signed)
74yo female arriving to [facility] via [place] at [phone].  Patient was LKW at 0200 by family and later woke up around 0700 with altered mental status and slurred speech.  Code stroke called on arrival to the hospital.  Patient to CT.  Stroke team to the bedside.  NIHSS 4, see documentation for details and code stroke times.  Patient with bilateral lower extremity weakness on exam.  Patient is outside the window for treatment with tPA.  No acute stroke treatment at this time.  Bedside handoff with ED RN Junie Panning.

## 2014-12-23 NOTE — Procedures (Signed)
Patient was seen on dialysis and the procedure was supervised. BFR 400 Via RIJ HD cath BP is 132/45.  Patient appears to be tolerating treatment well

## 2014-12-23 NOTE — ED Notes (Signed)
Pt HR got as low as 39. EKG obtained and given to MD.

## 2014-12-23 NOTE — Progress Notes (Signed)
Patient arrived on unit from hemodialysis.  Family at bedside.  Telemetry placed per MD order.  CMT notified.

## 2014-12-23 NOTE — Progress Notes (Signed)
Hemodialysis- Still awaiting bed. Continue to monitor

## 2014-12-23 NOTE — ED Notes (Signed)
CALLED CODE STROKE  

## 2014-12-23 NOTE — ED Notes (Signed)
Pt brought in EMS from home.  Family reports that pt was fine at 0200 this morning but when she woke up this morning she had some slurred speech and altered LOC.  EMS reports that pt was responsive with no slurred speech upon their arrival.  EMS did reports some urinary incontinence that was malodorous.  Pt is dialysis pt and is due today.

## 2014-12-23 NOTE — Progress Notes (Signed)
ANTIBIOTIC CONSULT NOTE - INITIAL  Pharmacy Consult for vancomycin Indication: febrile illness  No Known Allergies  Patient Measurements:     Vital Signs: Temp: 99.2 F (37.3 C) (09/08 1300) Temp Source: Oral (09/08 1300) BP: 164/57 mmHg (09/08 1500) Pulse Rate: 67 (09/08 1500) Intake/Output from previous day:   Intake/Output from this shift:    Labs:  Recent Labs  12/23/14 0900 12/23/14 0914  WBC 14.1*  --   HGB 11.0*  --   PLT 232  --   CREATININE 9.26* 9.10*   Estimated Creatinine Clearance: 7.5 mL/min (by C-G formula based on Cr of 9.1). No results for input(s): VANCOTROUGH, VANCOPEAK, VANCORANDOM, GENTTROUGH, GENTPEAK, GENTRANDOM, TOBRATROUGH, TOBRAPEAK, TOBRARND, AMIKACINPEAK, AMIKACINTROU, AMIKACIN in the last 72 hours.   Microbiology: Recent Results (from the past 720 hour(s))  Culture, blood (routine x 2)     Status: None (Preliminary result)   Collection Time: 12/23/14 10:05 AM  Result Value Ref Range Status   Specimen Description BLOOD LEFT ANTECUBITAL  Final   Special Requests BOTTLES DRAWN AEROBIC AND ANAEROBIC 5CC  Final   Culture PENDING  Incomplete   Report Status PENDING  Incomplete    Medical History: Past Medical History  Diagnosis Date  . Breast cancer   . Diabetes mellitus without complication   . Hypertension   . Arthritis   . COPD (chronic obstructive pulmonary disease)   . Renal disorder     Family reports acute renal failure  . GERD (gastroesophageal reflux disease)   . CKD (chronic kidney disease)   . Systolic CHF, chronic 123XX123    Assessment: 87 YOF with ESRD who started on HD in July 2016- she is on a TTSat schedule as an outpt. She presents today with confusion and slurred speech. She came off her last HD session early on 9/6 d/t hypotension (has been having problems with this recently). Her CXR is negative for infiltrate. She has an IJ perm cath which puts her at high risk for bacteremia. She received a 1g dose of  vancomycin in the ED along with a 3.375g IV x1 dose of Zosyn in the ED. WBC elevated at 14.1, tmax in the ED 102.5.  She is currently in HD- started at 1300 this afternoon- appears she is tolerating a BFR of 400 so far.  Goal of Therapy:  pre-HD vancomycin level 15-68mcg/mL  Plan:  -give vancomycin 2000mg  IV x1 after HD session today to finish an appropriate loading dose for her based on ESRD status and weight and also make up for what she cleared in HD -vancomycin 1g IV qHD-TTSat -f/u c/s, HD schedule/tolerance, levels PRN -f/u addition of any other abx  Jacyln Carmer D. Cyara Devoto, PharmD, BCPS Clinical Pharmacist Pager: [phone] 12/23/2014 3:29 PM

## 2014-12-23 NOTE — Progress Notes (Signed)
Report given to Wells Guiles RN and pt sent to his room .pt stable.

## 2014-12-23 NOTE — ED Provider Notes (Signed)
CSN: [id number]     Arrival date & time 12/23/14  0818 History   First MD Initiated Contact with Patient 12/23/14 [phone]     Chief Complaint  Patient presents with  . Weakness     (Consider location/radiation/quality/duration/timing/severity/associated sxs/prior Treatment) Patient is a 74 y.o. female presenting with altered mental status. The history is provided by a relative (Patient was last seen normal at 2 AM. Her son saw her this morning and she was confused with some slurred speech.).  Altered Mental Status Presenting symptoms: no behavior changes   Severity:  Moderate Most recent episode:  Today Episode history:  Single Timing:  Constant Progression:  Waxing and waning   Past Medical History  Diagnosis Date  . Breast cancer   . Diabetes mellitus without complication   . Hypertension   . Arthritis   . COPD (chronic obstructive pulmonary disease)   . Renal disorder     Family reports acute renal failure  . GERD (gastroesophageal reflux disease)   . CKD (chronic kidney disease)    Past Surgical History  Procedure Laterality Date  . Breast surgery Left   . Cardiac catheterization [name] 10/19/2014    Procedure: Right/Left Heart Cath and Coronary Angiography;  Surgeon: Peter M Martinique, MD;  Location: [facility];  Service: Cardiovascular;  Laterality: [name];  . Av fistula placement Right 10/26/2014    Procedure: Right Brachiocephalic Arteriovenous FISTULA CREATION;  Surgeon: Conrad Adrian, MD;  Location: [facility];  Service: Vascular;  Laterality: Right;   Family History  Problem Relation Age of Onset  . Diabetes Mother   . Hypertension Mother   . Stroke Sister   . Hypertension Sister   . Heart attack Neg Hx    Social History  Substance Use Topics  . Smoking status: Never Smoker   . Smokeless tobacco: None  . Alcohol Use: No   OB History    No data available     Review of Systems  Unable to perform ROS: Mental status change      Allergies  Review of patient's  allergies indicates no known allergies.  Home Medications   Prior to Admission medications   Medication Sig Start Date End Date Taking? Authorizing Provider  carvedilol (COREG) 6.25 MG tablet Take 1 tablet (6.25 mg total) by mouth 2 (two) times daily with a meal. 10/27/14   Barton Dubois, MD  gabapentin (NEURONTIN) 300 MG capsule Take 1 capsule (300 mg total) by mouth 2 (two) times daily. 11/10/14   Dayna N Dunn, PA-C  Insulin Detemir (LEVEMIR FLEXPEN) 100 UNIT/ML Pen Inject 70 Units into the skin every morning.    Historical Provider, MD  insulin lispro (HUMALOG KWIKPEN) 100 UNIT/ML KiwkPen Inject 1-5 Units into the skin 3 (three) times daily as needed (blood sugar).     Historical Provider, MD  Multiple Vitamins-Minerals (CENTRUM SILVER ADULT 50+ PO) Take 1 tablet by mouth daily.    Historical Provider, MD   BP 152/44 mmHg  Pulse 66  Temp(Src) 102.5 F (39.2 C) (Rectal)  Resp 25  SpO2 100% Physical Exam  Constitutional: She appears well-developed.  HENT:  Head: Normocephalic.  Eyes: Conjunctivae and EOM are normal. No scleral icterus.  Neck: Neck supple. No thyromegaly present.  Cardiovascular: Normal rate and regular rhythm.  Exam reveals no gallop and no friction rub.   No murmur heard. Pulmonary/Chest: No stridor. She has no wheezes. She has no rales. She exhibits no tenderness.  Abdominal: She exhibits no distension. There is  no tenderness. There is no rebound.  Musculoskeletal: Normal range of motion. She exhibits no edema.  Lymphadenopathy:    She has no cervical adenopathy.  Neurological: She exhibits normal muscle tone. Coordination normal.  Mild slurred speech and lethargy,  Oriented to person and place only  Skin: No rash noted. No erythema.  Psychiatric: She has a normal mood and affect. Her behavior is normal.    ED Course  Procedures (including critical care time) Labs Review Labs Reviewed  BASIC METABOLIC PANEL - Abnormal; Notable for the following:    Sodium  134 (*)    Chloride 97 (*)    Glucose, Bld 235 (*)    BUN 62 (*)    Creatinine, Ser 9.26 (*)    GFR calc non Af Amer 4 (*)    GFR calc Af Amer 4 (*)    All other components within normal limits  CBC - Abnormal; Notable for the following:    WBC 14.1 (*)    Hemoglobin 11.0 (*)    MCH 25.2 (*)    RDW 16.1 (*)    All other components within normal limits  URINALYSIS, ROUTINE W REFLEX MICROSCOPIC (NOT AT [facility]) - Abnormal; Notable for the following:    Color, Urine AMBER (*)    APPearance TURBID (*)    Hgb urine dipstick SMALL (*)    Bilirubin Urine SMALL (*)    Ketones, ur 15 (*)    Protein, ur 100 (*)    Leukocytes, UA LARGE (*)    All other components within normal limits  URINE MICROSCOPIC-ADD ON - Abnormal; Notable for the following:    Squamous Epithelial / LPF FEW (*)    Bacteria, UA MANY (*)    Casts GRANULAR CAST (*)    All other components within normal limits  CBG MONITORING, ED - Abnormal; Notable for the following:    Glucose-Capillary 219 (*)    All other components within normal limits  I-STAT CREATININE, ED - Abnormal; Notable for the following:    Creatinine, Ser 9.10 (*)    All other components within normal limits  CULTURE, BLOOD (ROUTINE X 2)  CULTURE, BLOOD (ROUTINE X 2)  URINE CULTURE  CBC WITH DIFFERENTIAL/PLATELET  CBG MONITORING, ED  I-STAT TROPOININ, ED  I-STAT CHEM 8, ED  I-STAT CG4 LACTIC ACID, ED    Imaging Review Ct Head Wo Contrast  12/23/2014   CLINICAL DATA:  Left facial droop, generalized weakness and slurred speech.  EXAM: CT HEAD WITHOUT CONTRAST  TECHNIQUE: Contiguous axial images were obtained from the base of the skull through the vertex without intravenous contrast.  COMPARISON:  None.  FINDINGS: The brain appears normal without hemorrhage, infarct, mass lesion, mass effect, midline shift or abnormal extra-axial fluid collection. No hydrocephalus or pneumocephalus. The calvarium is intact. Imaged paranasal sinuses and mastoid air cells  are clear.  IMPRESSION: Negative exam.  These results were called by telephone at the time of interpretation on 12/23/2014 at 8:58 am to Dr. Janann Colonel, who verbally acknowledged these results.   Electronically Signed   By: Inge Rise M.D.   On: 12/23/2014 09:02   Dg Chest Port 1 View  12/23/2014   CLINICAL DATA:  Fever.  EXAM: PORTABLE CHEST - 1 VIEW  COMPARISON:  10/15/2014  FINDINGS: There has been an interval insertion of dual lumen central venous catheter from right internal jugular approach, tip overlying expected location of the right atrium.  Cardiomediastinal silhouette is stably enlarged.  There is no evidence of  pneumothorax. Left hemidiaphragm is obscured which may be due to pleural effusion and atelectasis or airspace consolidation. Previously noted widespread airspace opacities have improved.  Osseous structures are without acute abnormality. Soft tissues are grossly normal.  IMPRESSION: Overall improved aeration of the lungs, with residual left lower lung opacity, which may represent pleural effusion with atelectasis, or focal airspace consolidation.  Stable cardiomegaly.   Electronically Signed   By: Fidela Salisbury M.D.   On: 12/23/2014 09:34   I have personally reviewed and evaluated these images and lab results as part of my medical decision-making.   EKG Interpretation   Date/Time:  Thursday December 23 2014 08:51:38 EDT Ventricular Rate:  56 PR Interval:  156 QRS Duration: 170 QT Interval:  485 QTC Calculation: 468 R Axis:   -50 Text Interpretation:  Sinus rhythm Left bundle branch block Confirmed by  Herlinda Heady  MD, Kimarion Chery ([id number]) on 12/23/2014 10:47:25 AM     CRITICAL CARE Performed by: Timo Hartwig L Total critical care time: 35 Critical care time was exclusive of separately billable procedures and treating other patients. Critical care was necessary to treat or prevent imminent or life-threatening deterioration. Critical care was time spent personally by me on the  following activities: development of treatment plan with patient and/or surrogate as well as nursing, discussions with consultants, evaluation of patient's response to treatment, examination of patient, obtaining history from patient or surrogate, ordering and performing treatments and interventions, ordering and review of laboratory studies, ordering and review of radiographic studies, pulse oximetry and re-evaluation of patient's condition. The patient initially presented with slurred speech and confusion. Nephrology saw the patient and felt like she was not having a stroke. She then spiked a fever to 102. Labs show that she had a urinary tract infection. Patient was cultured and put on antibiotics and then admitted.  MDM   Final diagnoses:  UTI (lower urinary tract infection)    Patient will be admitted for UTI by hospitalist. And she should be followed by renal.    Milton Ferguson, MD 12/23/14 1049

## 2014-12-23 NOTE — H&P (Signed)
[facility] History and Physical  Jordan [name] [id number] DOB: Sep 14, 1940 DOA: 12/23/2014  Referring physician:  Dr. Roderic Palau PCP: [name], MD / nephrologist: Dr. Joelyn Oms  Chief Complaint:  Confusion/slurred speech  HPI: Jordan [name] is a 74 y.o. female   with history of diabetes mellitus , insulin-dependent , CHF,  End-stage renal disease on hemodialysis Tuesdays and Thursdays and Saturdays, history of breast cancer with left upper extremity lymphedema , hypertension, gastroesophageal reflux disease who presents to the ED with confusion and slurred speech. Patient is not sure why she is in the hospital feels is secondary to blood pressure issues and a such information was obtained from patient's daughter and grandson who had bedside.  Per patient's grandson,  Patient awoke around 2 AM on the day of admission with complaints of feeling warm and shaking all over/chills. Patient was also noted to have a non- bloody emesis 3. Patient subsequently took her blood pressure medications and went back to bed. Patient's grandson went to check on his grandmother around 7 AM on the day of admission and noted she was very nonfebrile and was just staring and non-respondent and noted to be incoherent with some slurred speech. EMS was called and patient was subsequently brought to the ED. Patient does endorse some nausea, emesis, chills , generalized weakness. Patient denies any fevers, no shortness of breath, no chest, no abdominal pain, no diarrhea, no constipation, no melanotic, no hematemesis, no hematochezia, no cough.  Per daughter patient had hemodialysis on Tuesday and had to be taken off early secondary to low blood pressure issues.  Patient was seen in the emergency room urinalysis which was done was worrisome for a UTI. BASIC metabolic profile done had a sodium of 134 chloride of 97 BUN of 62 creatinine of  926 with a glucose level to 35. Point-of-care troponin was 0.07.  Lactic acid level is 1.31. CBC had a white count of 14.1 hemoglobin of 11.0 otherwise is within normal limits. CT head which was done was negative.  Patient was initially called a code stroke was seen by neurology felt this was likely a metabolic encephalopathy and no further workup was needed.    Review of Systems:  History of present illness otherwise negative. Constitutional:  No weight loss, night sweats, Fevers, chills, fatigue.  HEENT:  No headaches, Difficulty swallowing,Tooth/dental problems,Sore throat,  No sneezing, itching, ear ache, nasal congestion, post nasal drip,  Cardio-vascular:  No chest pain, Orthopnea, PND, swelling in lower extremities, anasarca, dizziness, palpitations  GI:  No heartburn, indigestion, abdominal pain, nausea, vomiting, diarrhea, change in bowel habits, loss of appetite  Resp:  No shortness of breath with exertion or at rest. No excess mucus, no productive cough, No non-productive cough, No coughing up of blood.No change in color of mucus.No wheezing.No chest wall deformity  Skin:  no rash or lesions.  GU:  no dysuria, change in color of urine, no urgency or frequency. No flank pain.  Musculoskeletal:  No joint pain or swelling. No decreased range of motion. No back pain.  Psych:  No change in mood or affect. No depression or anxiety. No memory loss.   Past Medical History  Diagnosis Date  . Breast cancer   . Diabetes mellitus without complication   . Hypertension   . Arthritis   . COPD (chronic obstructive pulmonary disease)   . Renal disorder     Family reports acute renal failure  . GERD (gastroesophageal reflux disease)   . CKD (chronic kidney disease)   .  Systolic CHF, chronic [id number]   Past Surgical History  Procedure Laterality Date  . Breast surgery Left   . Cardiac catheterization [name] 10/19/2014    Procedure: Right/Left Heart Cath and Coronary Angiography;  Surgeon: Peter M Martinique, MD;  Location: [facility];  Service:  Cardiovascular;  Laterality: [name];  . Av fistula placement Right 10/26/2014    Procedure: Right Brachiocephalic Arteriovenous FISTULA CREATION;  Surgeon: Conrad Cloverdale, MD;  Location: [place];  Service: Vascular;  Laterality: Right;   Social History:  reports that she has never smoked. She does not have any smokeless tobacco history on file. She reports that she does not drink alcohol. Her drug history is not on file.  No Known Allergies  Family History  Problem Relation Age of Onset  . Diabetes Mother   . Hypertension Mother   . Stroke Sister   . Hypertension Sister   . Heart attack Neg Hx     Prior to Admission medications   Medication Sig Start Date End Date Taking? Authorizing Provider  carvedilol (COREG) 6.25 MG tablet Take 1 tablet (6.25 mg total) by mouth 2 (two) times daily with a meal. 10/27/14   Barton Dubois, MD  gabapentin (NEURONTIN) 300 MG capsule Take 1 capsule (300 mg total) by mouth 2 (two) times daily. 11/10/14   Dayna N Dunn, PA-C  Insulin Detemir (LEVEMIR FLEXPEN) 100 UNIT/ML Pen Inject 70 Units into the skin every morning.    Historical Provider, MD  insulin lispro (HUMALOG KWIKPEN) 100 UNIT/ML KiwkPen Inject 1-5 Units into the skin 3 (three) times daily as needed (blood sugar).     Historical Provider, MD  Multiple Vitamins-Minerals (CENTRUM SILVER ADULT 50+ PO) Take 1 tablet by mouth daily.    Historical Provider, MD   Physical Exam: Filed Vitals:   12/23/14 1030 12/23/14 1100 12/23/14 1113 12/23/14 1116  BP: 116/45 121/79  121/79  Pulse: 52 52  47  Temp:   101.7 F (38.7 C)   TempSrc:   Rectal   Resp: 20 21  20   SpO2: 92% 89%  93%    Wt Readings from Last 3 Encounters:  12/10/14 122.925 kg (271 lb)  12/08/14 122.925 kg (271 lb)  11/10/14 130.636 kg (288 lb)    General:   Obese, well-developed well-nourished in no acute cardiopulmonary distress speaking in full sentences. Eyes: PERRLA,EOMI, normal lids, irises & conjunctiva ENT: grossly normal hearing,  lips & tongue Neck: no LAD, masses or thyromegaly Cardiovascular: RRR, no m/r/g. No LE edema. Respiratory: CTA bilaterally, no w/r/r. Normal respiratory effort. Abdomen: soft, ntnd, positive bowel sounds, no rebound, no guarding Skin: no rash or induration seen on limited exam Musculoskeletal: grossly normal tone BUE/BLE Psychiatric: grossly normal mood and affect, speech fluent and appropriate Neurologic:  Alert and oriented 3. Cranial 2 through 12 are grossly intact. No focal deficits.          Labs on Admission:  Basic Metabolic Panel:  Recent Labs Lab 12/23/14 0900 12/23/14 0914  NA 134*  --   K 4.7  --   CL 97*  --   CO2 24  --   GLUCOSE 235*  --   BUN 62*  --   CREATININE 9.26* 9.10*  CALCIUM 8.9  --    Liver Function Tests: No results for input(s): AST, ALT, ALKPHOS, BILITOT, PROT, ALBUMIN in the last 168 hours. No results for input(s): LIPASE, AMYLASE in the last 168 hours. No results for input(s): AMMONIA in the last 168 hours.  CBC:  Recent Labs Lab 12/23/14 0900  WBC 14.1*  HGB 11.0*  HCT 36.0  MCV 82.4  PLT 232   Cardiac Enzymes: No results for input(s): CKTOTAL, CKMB, CKMBINDEX, TROPONINI in the last 168 hours.  BNP (last 3 results)  Recent Labs  10/12/14 0100  BNP 143.8*    ProBNP (last 3 results) No results for input(s): PROBNP in the last 8760 hours.  CBG:  Recent Labs Lab 12/23/14 0900 12/23/14 1130  GLUCAP 219* 184*    Radiological Exams on Admission: Ct Head Wo Contrast  12/23/2014   CLINICAL DATA:  Left facial droop, generalized weakness and slurred speech.  EXAM: CT HEAD WITHOUT CONTRAST  TECHNIQUE: Contiguous axial images were obtained from the base of the skull through the vertex without intravenous contrast.  COMPARISON:  None.  FINDINGS: The brain appears normal without hemorrhage, infarct, mass lesion, mass effect, midline shift or abnormal extra-axial fluid collection. No hydrocephalus or pneumocephalus. The calvarium is  intact. Imaged paranasal sinuses and mastoid air cells are clear.  IMPRESSION: Negative exam.  These results were called by telephone at the time of interpretation on 12/23/2014 at 8:58 am to Dr. Janann Colonel, who verbally acknowledged these results.   Electronically Signed   By: Inge Rise M.D.   On: 12/23/2014 09:02   Dg Chest Port 1 View  12/23/2014   CLINICAL DATA:  Fever.  EXAM: PORTABLE CHEST - 1 VIEW  COMPARISON:  10/15/2014  FINDINGS: There has been an interval insertion of dual lumen central venous catheter from right internal jugular approach, tip overlying expected location of the right atrium.  Cardiomediastinal silhouette is stably enlarged.  There is no evidence of pneumothorax. Left hemidiaphragm is obscured which may be due to pleural effusion and atelectasis or airspace consolidation. Previously noted widespread airspace opacities have improved.  Osseous structures are without acute abnormality. Soft tissues are grossly normal.  IMPRESSION: Overall improved aeration of the lungs, with residual left lower lung opacity, which may represent pleural effusion with atelectasis, or focal airspace consolidation.  Stable cardiomegaly.   Electronically Signed   By: Fidela Salisbury M.D.   On: 12/23/2014 09:34    EKG: Independently reviewed.  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  Assessment/Plan Principal Problem:   Acute encephalopathy Active Problems:   UTI (lower urinary tract infection)   Breast cancer   Hypertension   COPD (chronic obstructive pulmonary disease)   Essential hypertension   ESRD (end stage renal disease) on dialysis   Type 2 diabetes mellitus with diabetic nephropathy   Nonischemic cardiomyopathy   Morbidly obese   OSA (obstructive sleep apnea)   Constipation   Systolic CHF, chronic   Encephalopathy, metabolic    #1  Acute metabolic encephalopathy  likely secondary to urinary tract infection. Patient with already some clinical improvement after receiving IV  antibiotics. Will check urine cultures. Check blood cultures 2. Chest x-ray with no significant evidence for infiltrate. Will place empirically on IV Rocephin. Follow.  #2 Urinary tract infection Check urine cultures. Place on IV Rocephin.  #3 end-stage renal disease on hemodialysis Tuesdays Thursdays Saturdays  Dr. Moshe Cipro of [facility] has been notified of patient's admission per ED physician. Patient likely for hemodialysis today.  #4 type 2 diabetes mellitus with diabetic nephropathy  check a hemoglobin A1c. Continue home regimen of Levemir. Place on sliding scale insulin.  #5 hypertension Continue Coreg.  #6 nonischemic cardiomyopathy /chronic systolic heart failure Stable. Patient looks euvolemic. Continue beta blocker. Monitor blood pressure on  hemodialysis.  #7 constipation  Will place on Senokot-S daily.   #8 prophylaxis Heparin for DVT prophylaxis.    Code Status:  full DVT Prophylaxis: heparin. Family Communication:  Updated patient, daughter, grandson at bedside. Disposition Plan: Admit to med surg  Time spent: 50 minutes  [facility] MD [facility] Pager [phone]

## 2014-12-23 NOTE — ED Notes (Signed)
[place] MD made aware of pt

## 2014-12-23 NOTE — Progress Notes (Addendum)
Hemodialysis complete 1715. Tolerated well. Awaiting bed. Continue to monitor

## 2014-12-24 ENCOUNTER — Encounter (HOSPITAL_COMMUNITY): Payer: Self-pay | Admitting: General Practice

## 2014-12-24 DIAGNOSIS — G9340 Encephalopathy, unspecified: Secondary | ICD-10-CM

## 2014-12-24 DIAGNOSIS — E1121 Type 2 diabetes mellitus with diabetic nephropathy: Secondary | ICD-10-CM

## 2014-12-24 LAB — GLUCOSE, CAPILLARY
GLUCOSE-CAPILLARY: 144 mg/dL — AB (ref 65–99)
GLUCOSE-CAPILLARY: 323 mg/dL — AB (ref 65–99)
GLUCOSE-CAPILLARY: 66 mg/dL (ref 65–99)
Glucose-Capillary: 161 mg/dL — ABNORMAL HIGH (ref 65–99)
Glucose-Capillary: 89 mg/dL (ref 65–99)

## 2014-12-24 LAB — RENAL FUNCTION PANEL
ALBUMIN: 2.8 g/dL — AB (ref 3.5–5.0)
Anion gap: 10 (ref 5–15)
BUN: 30 mg/dL — AB (ref 6–20)
CHLORIDE: 98 mmol/L — AB (ref 101–111)
CO2: 28 mmol/L (ref 22–32)
CREATININE: 6.05 mg/dL — AB (ref 0.44–1.00)
Calcium: 8.6 mg/dL — ABNORMAL LOW (ref 8.9–10.3)
GFR calc Af Amer: 7 mL/min — ABNORMAL LOW (ref >60)
GFR, EST NON AFRICAN AMERICAN: 6 mL/min — AB (ref >60)
GLUCOSE: 164 mg/dL — AB (ref 65–99)
PHOSPHORUS: 4.1 mg/dL (ref 2.5–4.6)
POTASSIUM: 3.7 mmol/L (ref 3.5–5.1)
Sodium: 136 mmol/L (ref 135–145)

## 2014-12-24 LAB — CBC
HEMATOCRIT: 32.8 % — AB (ref 36.0–46.0)
Hemoglobin: 9.9 g/dL — ABNORMAL LOW (ref 12.0–15.0)
MCH: 24.9 pg — ABNORMAL LOW (ref 26.0–34.0)
MCHC: 30.2 g/dL (ref 30.0–36.0)
MCV: 82.4 fL (ref 78.0–100.0)
PLATELETS: 245 10*3/uL (ref 150–400)
RBC: 3.98 MIL/uL (ref 3.87–5.11)
RDW: 16.1 % — AB (ref 11.5–15.5)
WBC: 10.8 10*3/uL — AB (ref 4.0–10.5)

## 2014-12-24 MED ORDER — CEFTRIAXONE SODIUM 2 G IJ SOLR
2.0000 g | INTRAMUSCULAR | Status: DC
  Administered 2014-12-24: 2 g via INTRAVENOUS
  Filled 2014-12-24 (×2): qty 2

## 2014-12-24 MED ORDER — DEXTROSE 5 % IV SOLN: 2.0000 g | INTRAVENOUS | Status: DC

## 2014-12-24 NOTE — Progress Notes (Signed)
CRITICAL VALUE ALERT  Critical value received: Gram positive cocci in clusters in both aerobic and anaerobic bottles Date of notification: 12/24/14 Time of notification: 0100 Critical value read back: Yes Nurse who received alert: Percell Boston, RN MD notified (1st page): Lamar Blinks, NP

## 2014-12-24 NOTE — Progress Notes (Signed)
Subjective:   Feeling better. A little nausea so not eating much breakfast  Objective Filed Vitals:   12/23/14 1903 12/23/14 2131 12/24/14 0436 12/24/14 0840  BP: 122/40 127/38 116/38   Pulse: 66 76 63   Temp: 98 F (36.7 C) 101.9 F (38.8 C) 98.9 F (37.2 C)   TempSrc: Oral Oral Oral   Resp: 20 18 16    Height:    5\' 4"  (1.626 m)  Weight: 125.2 kg (276 lb 0.3 oz)     SpO2: 96% 95% 100%    Physical Exam General: alert and oriented. No acute distress. Sitting in chair Heart: RRR  Lungs: CTA/dim, unlabored Abdomen: morbidly obese. Soft, nontender +BS  Extremities: trace LE edema  Dialysis Access:  R avf +b/t  R IJ cath  Dialysis Orders: [facility] 4.25 180 400/800 EDW 123 3 K 2.5 Ca profile 2 heparin 13,000 venofer 100 through 9/20 [place] 100 q 2 weeks last given 9/6 calcitriol 0.75 Recent: Hgb 10.8 9/1 13% sat iPH 327 have been using 1 needle in AVF The past 3 treatments at Qb 250  Assessment/Plan: 1. AMS/febrile illness - feels better. tmax 101.9 last night. Blood cultures Gram + cocci. UTI. On Vanc, zosyn and Rocephin. HD cath likely source of bacteremia- using 1 needle in AVF - try using 2 tomorrow Plan is to do HD in am and have cath removed thereafter  2. ESRD - TTS - keep using AVF. HD tomorrow. K+3.7- use 4K bath tomorrow 3. Hypertension/volume - BP here variable but overall controlled. - . Given morbid obesity, left lymphedema, it is difficult to assess volume status; on coreg 6.25 bid at home  4. Anemia - Hgb 9.9- watch CBC - just dosed 9/6 5. Metabolic bone disease - Continue calcitriol; not on binder or sensipar 6. Nutrition - morbidly obese. EDW lowered some since starting HD, but most decrease likely to volume removal. Renal diet.  7. Hx breast cancer s/p left mastectomy - chronic left arm lymphedema 8. NICM EF 30%, recent NSTEMI low dose BB 9. DM 10. HX OSA - referred to pul 8/24 by PCP- has used CPAP in the past  Shelle Iron, NP [place] [phone] 12/24/2014,9:37 AM  LOS: 1 day   Pt seen, examined and agree w A/P as above.  Kelly Splinter MD pager [phone]    cell [phone] 12/24/2014, 1:31 PM    Additional Objective Labs: Basic Metabolic Panel:  Recent Labs Lab 12/23/14 0900 12/23/14 0914 12/24/14 0710  NA 134*  --  136  K 4.7  --  3.7  CL 97*  --  98*  CO2 24  --  28  GLUCOSE 235*  --  164*  BUN 62*  --  30*  CREATININE 9.26* 9.10* 6.05*  CALCIUM 8.9  --  8.6*  PHOS  --   --  4.1   Liver Function Tests:  Recent Labs Lab 12/24/14 0710  ALBUMIN 2.8*   No results for input(s): LIPASE, AMYLASE in the last 168 hours. CBC:  Recent Labs Lab 12/23/14 0900 12/24/14 0710  WBC 14.1* 10.8*  HGB 11.0* 9.9*  HCT 36.0 32.8*  MCV 82.4 82.4  PLT 232 245   Blood Culture    Component Value Date/Time   SDES BLOOD LEFT HAND 12/23/2014 1055   SPECREQUEST BOTTLES DRAWN AEROBIC ONLY 3CC 12/23/2014 1055   CULT PENDING 12/23/2014 1055   REPTSTATUS PENDING 12/23/2014 1055    Cardiac Enzymes: No results for input(s): CKTOTAL, CKMB, CKMBINDEX, TROPONINI  in the last 168 hours. CBG:  Recent Labs Lab 12/23/14 1130 12/23/14 1214 12/23/14 1856 12/23/14 2124 12/24/14 0746  GLUCAP 184* 207* 158* 196* 144*   Iron Studies: No results for input(s): IRON, TIBC, TRANSFERRIN, FERRITIN in the last 72 hours. @lablastinr3 @ Studies/Results: Ct Head Wo Contrast  12/23/2014   CLINICAL DATA:  Left facial droop, generalized weakness and slurred speech.  EXAM: CT HEAD WITHOUT CONTRAST  TECHNIQUE: Contiguous axial images were obtained from the base of the skull through the vertex without intravenous contrast.  COMPARISON:  None.  FINDINGS: The brain appears normal without hemorrhage, infarct, mass lesion, mass effect, midline shift or abnormal extra-axial fluid collection. No hydrocephalus or pneumocephalus. The calvarium is intact. Imaged paranasal sinuses and mastoid air cells are clear.  IMPRESSION: Negative  exam.  These results were called by telephone at the time of interpretation on 12/23/2014 at 8:58 am to Dr. Janann Colonel, who verbally acknowledged these results.   Electronically Signed   By: Inge Rise M.D.   On: 12/23/2014 09:02   Dg Chest Port 1 View  12/23/2014   CLINICAL DATA:  Fever.  EXAM: PORTABLE CHEST - 1 VIEW  COMPARISON:  10/15/2014  FINDINGS: There has been an interval insertion of dual lumen central venous catheter from right internal jugular approach, tip overlying expected location of the right atrium.  Cardiomediastinal silhouette is stably enlarged.  There is no evidence of pneumothorax. Left hemidiaphragm is obscured which may be due to pleural effusion and atelectasis or airspace consolidation. Previously noted widespread airspace opacities have improved.  Osseous structures are without acute abnormality. Soft tissues are grossly normal.  IMPRESSION: Overall improved aeration of the lungs, with residual left lower lung opacity, which may represent pleural effusion with atelectasis, or focal airspace consolidation.  Stable cardiomegaly.   Electronically Signed   By: Fidela Salisbury M.D.   On: 12/23/2014 09:34   Medications:   . calcitRIOL  0.75 mcg Oral Q T,Th,Sa-HD  . carvedilol  6.25 mg Oral BID WC  . cefTRIAXone (ROCEPHIN)  IV  1 g Intravenous Q24H  . heparin  5,000 Units Subcutaneous 3 times per day  . insulin aspart  0-9 Units Subcutaneous TID WC  . insulin detemir  70 Units Subcutaneous Q24H  . sodium chloride  3 mL Intravenous Q12H  . [START ON 12/25/2014] vancomycin  1,000 mg Intravenous Q T,Th,Sa-HD

## 2014-12-24 NOTE — Progress Notes (Addendum)
[facility] PROGRESS NOTE  Jordan [name] [id number] DOB: August 08, 1940 DOA: 12/23/2014 PCP: [name], MD  Assessment/Plan: 1. GPC Sepsis  -Fever, blood cultures with gram-positive cocci in clusters suspect right chest HD catheter is the source -Continue IV vancomycin  -Will need HD catheter removed after dialysis tomorrow and a line holiday over the weekend -Repeat blood cultures  2. Metabolic encephalopathy -Due to sepsis, mentation back to baseline, no focal signs, CT head and unremarkable  3. ESRD - completed HD yesterday prior to admission  -As an AV fistula in the right arm which was placed in July, not mature yet, plan to remove HD catheter after HD tomorrow -Will need new dialysis catheter placed probably on Monday, if repeat blood cultures stay negative  4. Hypertension/volume -  -Stable, on coreg   5. Anemia of chronic disease and iron deficiency -Stable, continue Aranesp  6. History of breast cancer status post left mastectomy and chronic left arm lymphedema  7. History of chronic systolic CHF/NICM, EF [id number],  -volume managed with HD -Continue Coreg  8. Diabetes mellitus -Stable continue Levemir, sliding scale insulin will follow HbA1c  9. OSA - referred to pul 8/24 by PCP- has used CPAP in the past  10. Possible UTI too -continue rocephin, FU urine Cx  Morbid obesity  DVT prophylaxis : Heparin subcutaneous    Code Status: Full Code Family Communication: none at bedside Disposition Plan: Home when improved   Consultants:  Renal  HPI/Subjective: Feels ok, better today  Objective: Filed Vitals:   12/24/14 1000  BP: 139/46  Pulse: 62  Temp: 98.2 F (36.8 C)  Resp: 18    Intake/Output Summary (Last 24 hours) at 12/24/14 1336 Last data filed at 12/24/14 0919  Gross per 24 hour  Intake    455 ml  Output   1502 ml  Net  -1047 ml   Filed Weights   12/23/14 1903  Weight: 125.2 kg (276 lb 0.3 oz)     Exam:   General:  AAOx3  Cardiovascular: S1S2/RRR  HEENT: HD cath in R chest  Respiratory: CTAB  Abdomen: soft, NT, BSpresent  Musculoskeletal: no edema c/c   Data Reviewed: Basic Metabolic Panel:  Recent Labs Lab 12/23/14 0900 12/23/14 0914 12/24/14 0710  NA 134*  --  136  K 4.7  --  3.7  CL 97*  --  98*  CO2 24  --  28  GLUCOSE 235*  --  164*  BUN 62*  --  30*  CREATININE 9.26* 9.10* 6.05*  CALCIUM 8.9  --  8.6*  MG 2.0  --   --   PHOS  --   --  4.1   Liver Function Tests:  Recent Labs Lab 12/24/14 0710  ALBUMIN 2.8*   No results for input(s): LIPASE, AMYLASE in the last 168 hours. No results for input(s): AMMONIA in the last 168 hours. CBC:  Recent Labs Lab 12/23/14 0900 12/24/14 0710  WBC 14.1* 10.8*  HGB 11.0* 9.9*  HCT 36.0 32.8*  MCV 82.4 82.4  PLT 232 245   Cardiac Enzymes: No results for input(s): CKTOTAL, CKMB, CKMBINDEX, TROPONINI in the last 168 hours. BNP (last 3 results)  Recent Labs  10/12/14 0100  BNP 143.8*    ProBNP (last 3 results) No results for input(s): PROBNP in the last 8760 hours.  CBG:  Recent Labs Lab 12/23/14 1214 12/23/14 1856 12/23/14 2124 12/24/14 0746 12/24/14 1220  GLUCAP 207* 158* 196* 144* 161*    Recent Results (from  the past 240 hour(s))  Urine culture     Status: None (Preliminary result)   Collection Time: 12/23/14  9:41 AM  Result Value Ref Range Status   Specimen Description URINE, CATHETERIZED  Final   Special Requests Normal  Final   Culture >=100,000 COLONIES/mL GRAM NEGATIVE RODS  Final   Report Status PENDING  Incomplete  Culture, blood (routine x 2)     Status: None (Preliminary result)   Collection Time: 12/23/14 10:05 AM  Result Value Ref Range Status   Specimen Description BLOOD LEFT ANTECUBITAL  Final   Special Requests BOTTLES DRAWN AEROBIC AND ANAEROBIC 5CC  Final   Culture  Setup Time   Final    GRAM POSITIVE COCCI IN CLUSTERS IN BOTH AEROBIC AND ANAEROBIC  BOTTLES CRITICAL RESULT CALLED TO, READ BACK BY AND VERIFIED WITH: M MURPHY@0057  12/24/14 MKELLY    Culture GRAM POSITIVE COCCI  Final   Report Status PENDING  Incomplete  Culture, blood (routine x 2)     Status: None (Preliminary result)   Collection Time: 12/23/14 10:55 AM  Result Value Ref Range Status   Specimen Description BLOOD LEFT HAND  Final   Special Requests BOTTLES DRAWN AEROBIC ONLY 3CC  Final   Culture  Setup Time   Final    GRAM POSITIVE COCCI IN CLUSTERS AEROBIC BOTTLE ONLY CRITICAL RESULT CALLED TO, READ BACK BY AND VERIFIED WITH: T ISAACS@[id number]  12/24/14 MKELLY    Culture NO GROWTH 1 DAY  Final   Report Status PENDING  Incomplete     Studies: Ct Head Wo Contrast  12/23/2014   CLINICAL DATA:  Left facial droop, generalized weakness and slurred speech.  EXAM: CT HEAD WITHOUT CONTRAST  TECHNIQUE: Contiguous axial images were obtained from the base of the skull through the vertex without intravenous contrast.  COMPARISON:  None.  FINDINGS: The brain appears normal without hemorrhage, infarct, mass lesion, mass effect, midline shift or abnormal extra-axial fluid collection. No hydrocephalus or pneumocephalus. The calvarium is intact. Imaged paranasal sinuses and mastoid air cells are clear.  IMPRESSION: Negative exam.  These results were called by telephone at the time of interpretation on 12/23/2014 at 8:58 am to Dr. Janann Colonel, who verbally acknowledged these results.   Electronically Signed   By: Inge Rise M.D.   On: 12/23/2014 09:02   Dg Chest Port 1 View  12/23/2014   CLINICAL DATA:  Fever.  EXAM: PORTABLE CHEST - 1 VIEW  COMPARISON:  10/15/2014  FINDINGS: There has been an interval insertion of dual lumen central venous catheter from right internal jugular approach, tip overlying expected location of the right atrium.  Cardiomediastinal silhouette is stably enlarged.  There is no evidence of pneumothorax. Left hemidiaphragm is obscured which may be due to pleural effusion and  atelectasis or airspace consolidation. Previously noted widespread airspace opacities have improved.  Osseous structures are without acute abnormality. Soft tissues are grossly normal.  IMPRESSION: Overall improved aeration of the lungs, with residual left lower lung opacity, which may represent pleural effusion with atelectasis, or focal airspace consolidation.  Stable cardiomegaly.   Electronically Signed   By: Fidela Salisbury M.D.   On: 12/23/2014 09:34    Scheduled Meds: . calcitRIOL  0.75 mcg Oral Q T,Th,Sa-HD  . carvedilol  6.25 mg Oral BID WC  . cefTRIAXone (ROCEPHIN)  IV  1 g Intravenous Q24H  . heparin  5,000 Units Subcutaneous 3 times per day  . insulin aspart  0-9 Units Subcutaneous TID WC  . insulin detemir  70 Units Subcutaneous Q24H  . sodium chloride  3 mL Intravenous Q12H  . [START ON 12/25/2014] vancomycin  1,000 mg Intravenous Q T,Th,Sa-HD   Continuous Infusions:  Antibiotics Given (last 72 hours)    Date/Time Action Medication Dose Rate   12/23/14 1634 Given   vancomycin (VANCOCIN) 2,000 mg in sodium chloride 0.9 % 250 mL IVPB 2,000 mg 250 mL/hr   12/23/14 2055 Given   cefTRIAXone (ROCEPHIN) 1 g in dextrose 5 % 50 mL IVPB 1 g 100 mL/hr      Principal Problem:   Acute encephalopathy Active Problems:   Breast cancer   Hypertension   COPD (chronic obstructive pulmonary disease)   Essential hypertension   ESRD (end stage renal disease) on dialysis   Type 2 diabetes mellitus with diabetic nephropathy   Nonischemic cardiomyopathy   Morbidly obese   OSA (obstructive sleep apnea)   Constipation   UTI (lower urinary tract infection)   Systolic CHF, chronic   Encephalopathy, metabolic    Time spent: [id number]    Jordan [name]  [facility] Pager [phone]. If 7PM-7AM, please contact night-coverage at [email or web address], password [facility] 12/24/2014, 1:36 PM  LOS: 1 day

## 2014-12-24 NOTE — Progress Notes (Addendum)
PT Cancellation Note  Patient Details Name: Jordan [name] MRN: [id number] DOB: 10/06/1940   Cancelled Treatment:    Reason Eval/Treat Not Completed: Fatigue/lethargy limiting ability to participate.  Patient declined PT today reporting feeling nauseated.  Will return tomorrow for PT evaluation.   Despina Pole 12/24/2014, 4:39 PM Carita Pian. Sanjuana Kava, [place] Pager [phone]

## 2014-12-24 NOTE — Evaluation (Signed)
Occupational Therapy Evaluation Patient Details Name: Jordan [name] MRN: [id number] DOB: 04-30-1940 Today's Date: 12/24/2014    History of Present Illness Pt is a 74 y.o. Female admitted to hospital with acute onset of confusion, slurred speech and acute encephalopathy. PMH: DM, CHF, end stage renal disease, HTN, UTI, breast cancer, COPD and mordidly obese   Clinical Impression   PT admitted to hospital due reasons stated above. Pt currently with functional limitiations due to the deficits listed below (see OT problem list). Prior to admission required min-mod A from daughter for completion of ADLs/IADLs. Pt currently requires min-max A and verbal cues for ADLs and safety due to decreased activity tolerance and decrease strengthening. Pt reports previous [place] with focuses on strengthening and activity tolerance using energy conservation techniques. Pt will benefit from skilled HHOT to increase independence and safety with ADLs, balance, strengthening and activity tolerance.    Follow Up Recommendations  Home health OT (Pt reports having Jordan [name] prior to admission)    Equipment Recommendations  None recommended by OT    Recommendations for Other Services       Precautions / Restrictions Precautions Precautions: Fall Restrictions Weight Bearing Restrictions: No      Mobility Bed Mobility Overal bed mobility: Needs Assistance Bed Mobility: Sit to Supine;Rolling Rolling: Supervision (Pt relies on bed rail)     Sit to supine: Supervision   General bed mobility comments: Pt relies on bed rails for bed mobility and noted SOB and onset of nausea with positonal change onto bed.  Transfers Overall transfer level: Needs assistance Equipment used: Rolling walker (2 wheeled) Transfers: Sit to/from Stand Sit to Stand: Min guard         General transfer comment: Pt performed sit to stand transfer slow and steady combined with a forward rocking motion prior to  standing.    Balance Overall balance assessment: Needs assistance Sitting-balance support: No upper extremity supported Sitting balance-Leahy Scale: Fair     Standing balance support: Bilateral upper extremity supported;During functional activity Standing balance-Leahy Scale: Poor Standing balance comment: Pt required UE support from sink counter while washing/drying hands                            ADL Overall ADL's : Needs assistance/impaired Eating/Feeding: Independent   Grooming: Wash/dry hands;Min guard;Cueing for sequencing;Standing Grooming Details (indicate cue type and reason): Pt required verbal cues for reminder to wash hands after toileting. Pt required UE support using sink counter to wash hands. Upper Body Bathing: Set up;Minimal assitance;Sitting   Lower Body Bathing: Cueing for safety;Sit to/from stand;Maximal assistance Lower Body Bathing Details (indicate cue type and reason): Pt unable to reach below knees Upper Body Dressing : Set up;Minimal assistance;Sitting   Lower Body Dressing: Set up;Cueing for safety;Sit to/from stand;Maximal assistance Lower Body Dressing Details (indicate cue type and reason): Pt unable to don/doff socks without assistance Toilet Transfer: Min guard;Cueing for safety;Ambulation;RW;Regular Toilet;Grab bars   Toileting- Clothing Manipulation and Hygiene: Min guard;Cueing for safety;Sit to/from stand Toileting - Clothing Manipulation Details (indicate cue type and reason): Therapist questions how well pt is performing peri-care due to BM residue found out seat Tub/ Shower Transfer: Moderate assistance;Ambulation;Rolling walker   Functional mobility during ADLs: Min guard;Rolling walker General ADL Comments: Per pt daughter assists with bathing      Vision     Perception     Praxis      Pertinent Vitals/Pain Pain Assessment: No/denies  pain     Hand Dominance Right   Extremity/Trunk Assessment Upper Extremity  Assessment Upper Extremity Assessment: Overall WFL for tasks assessed;Generalized weakness       Cervical / Trunk Assessment Cervical / Trunk Assessment: Normal   Communication Communication Communication: No difficulties;HOH   Cognition Arousal/Alertness: Awake/alert Behavior During Therapy: WFL for tasks assessed/performed;Flat affect Overall Cognitive Status: Within Functional Limits for tasks assessed (Pt AOx2 (person and place))                     General Comments       Exercises       Shoulder Instructions      Home Living Family/patient expects to be discharged to:: Private residence Living Arrangements: Other relatives Biomedical scientist) Available Help at Discharge: Family;Available PRN/intermittently (Grandson is in college) Type of Home: House Home Access: Level entry     Home Layout: Two level;Able to live on main level with bedroom/bathroom Alternate Level Stairs-Number of Steps: 16 Alternate Level Stairs-Rails: Can reach both Bathroom Shower/Tub: Tub/shower unit Shower/tub characteristics: Architectural technologist: Standard Bathroom Accessibility: Yes How Accessible: Accessible via walker Home Equipment: Cobb - 2 wheels;Shower seat;Walker - 4 wheels;Grab bars - tub/shower;Hand held shower head   Additional Comments: Pt reports level entrance into home, however, previous documentation reports stairs prior to entry of home      Prior Functioning/Environment Level of Independence: Needs assistance    ADL's / Homemaking Assistance Needed: Family does housework and cooking. Pt requires assistance from daughter for bathing        OT Diagnosis: Generalized weakness;Altered mental status   OT Problem List: Decreased strength;Decreased activity tolerance;Impaired balance (sitting and/or standing)   OT Treatment/Interventions:      OT Goals(Current goals can be found in the care plan section) Acute Rehab OT Goals Patient Stated Goal: return home  OT  Frequency:     Barriers to D/C:            Co-evaluation              End of Session Equipment Utilized During Treatment: Gait belt;Rolling walker Nurse Communication: Other (comment) (Pt reported feeling nausea once back in bed. )  Activity Tolerance: Patient tolerated treatment well;Patient limited by fatigue Patient left: in bed;with call bell/phone within reach   Time: 0940-1004 OT Time Calculation (min): 24 min Charges:  OT General Charges $OT Visit: 1 Procedure OT Evaluation $Initial OT Evaluation Tier I: 1 Procedure OT Treatments $Self Care/Home Management : 8-22 mins G-Codes:    Lin Landsman 01-10-15, 11:00 AM

## 2014-12-24 NOTE — Progress Notes (Signed)
Inpatient Diabetes Program Recommendations  AACE/ADA: New Consensus Statement on Inpatient Glycemic Control (2013)  Target Ranges:  Prepandial:   less than 140 mg/dL      Peak postprandial:   less than 180 mg/dL (1-2 hours)      Critically ill patients:  140 - 180 mg/dL   Reason for Visit: Diabetes Coordinator consult  Diabetes history: Type 2 DM Outpatient Diabetes medications: Levemir 70 units daily, Humalog 1-5 units TID Current orders for Inpatient glycemic control: Levemir 70 units daily, Novolog SENSITIVE correction scale TID     Note: CBGs have been running less 180 mg/dl today in the hospital.  On home dose of insulin. Patient's HgbA1C is 9.4%. On dialysis which will cause A1C to be abnormal.  Will continue to follow while in hospital. Harvel Ricks RN BSN CDE

## 2014-12-25 ENCOUNTER — Inpatient Hospital Stay (HOSPITAL_COMMUNITY): Payer: Medicare (Managed Care)

## 2014-12-25 ENCOUNTER — Encounter (HOSPITAL_COMMUNITY): Payer: Self-pay | Admitting: Infectious Diseases

## 2014-12-25 DIAGNOSIS — J449 Chronic obstructive pulmonary disease, unspecified: Secondary | ICD-10-CM

## 2014-12-25 DIAGNOSIS — B952 Enterococcus as the cause of diseases classified elsewhere: Secondary | ICD-10-CM

## 2014-12-25 DIAGNOSIS — N390 Urinary tract infection, site not specified: Secondary | ICD-10-CM

## 2014-12-25 DIAGNOSIS — I428 Other cardiomyopathies: Secondary | ICD-10-CM

## 2014-12-25 DIAGNOSIS — E1122 Type 2 diabetes mellitus with diabetic chronic kidney disease: Secondary | ICD-10-CM

## 2014-12-25 DIAGNOSIS — N186 End stage renal disease: Secondary | ICD-10-CM

## 2014-12-25 DIAGNOSIS — B961 Klebsiella pneumoniae [K. pneumoniae] as the cause of diseases classified elsewhere: Secondary | ICD-10-CM

## 2014-12-25 DIAGNOSIS — Z992 Dependence on renal dialysis: Secondary | ICD-10-CM

## 2014-12-25 DIAGNOSIS — I252 Old myocardial infarction: Secondary | ICD-10-CM

## 2014-12-25 DIAGNOSIS — T827XXA Infection and inflammatory reaction due to other cardiac and vascular devices, implants and grafts, initial encounter: Principal | ICD-10-CM

## 2014-12-25 LAB — URINE CULTURE: SPECIAL REQUESTS: NORMAL

## 2014-12-25 LAB — BASIC METABOLIC PANEL
ANION GAP: 12 (ref 5–15)
BUN: 38 mg/dL — ABNORMAL HIGH (ref 6–20)
CALCIUM: 8.5 mg/dL — AB (ref 8.9–10.3)
CHLORIDE: 97 mmol/L — AB (ref 101–111)
CO2: 26 mmol/L (ref 22–32)
CREATININE: 7.75 mg/dL — AB (ref 0.44–1.00)
GFR calc non Af Amer: 5 mL/min — ABNORMAL LOW (ref >60)
GFR, EST AFRICAN AMERICAN: 5 mL/min — AB (ref >60)
Glucose, Bld: 91 mg/dL (ref 65–99)
Potassium: 3.6 mmol/L (ref 3.5–5.1)
SODIUM: 135 mmol/L (ref 135–145)

## 2014-12-25 LAB — CBC
HCT: 31.7 % — ABNORMAL LOW (ref 36.0–46.0)
HEMATOCRIT: 31.1 % — AB (ref 36.0–46.0)
HEMOGLOBIN: 9.2 g/dL — AB (ref 12.0–15.0)
Hemoglobin: 9.7 g/dL — ABNORMAL LOW (ref 12.0–15.0)
MCH: 24.6 pg — ABNORMAL LOW (ref 26.0–34.0)
MCH: 25.3 pg — ABNORMAL LOW (ref 26.0–34.0)
MCHC: 29.6 g/dL — AB (ref 30.0–36.0)
MCHC: 30.6 g/dL (ref 30.0–36.0)
MCV: 83.2 fL (ref 78.0–100.0)
MCV: 82.6 fL (ref 78.0–100.0)
Platelets: 256 10*3/uL (ref 150–400)
Platelets: 245 10*3/uL (ref 150–400)
RBC: 3.74 MIL/uL — ABNORMAL LOW (ref 3.87–5.11)
RBC: 3.84 MIL/uL — ABNORMAL LOW (ref 3.87–5.11)
RDW: 16.3 % — ABNORMAL HIGH (ref 11.5–15.5)
RDW: 16.3 % — AB (ref 11.5–15.5)
WBC: 10.7 10*3/uL — AB (ref 4.0–10.5)
WBC: 10.9 10*3/uL — AB (ref 4.0–10.5)

## 2014-12-25 LAB — CULTURE, BLOOD (ROUTINE X 2)

## 2014-12-25 LAB — RENAL FUNCTION PANEL
ALBUMIN: 2.6 g/dL — AB (ref 3.5–5.0)
ANION GAP: 11 (ref 5–15)
BUN: 39 mg/dL — AB (ref 6–20)
CALCIUM: 8.6 mg/dL — AB (ref 8.9–10.3)
CO2: 26 mmol/L (ref 22–32)
Chloride: 97 mmol/L — ABNORMAL LOW (ref 101–111)
Creatinine, Ser: 7.79 mg/dL — ABNORMAL HIGH (ref 0.44–1.00)
GFR calc Af Amer: 5 mL/min — ABNORMAL LOW (ref >60)
GFR, EST NON AFRICAN AMERICAN: 5 mL/min — AB (ref >60)
GLUCOSE: 98 mg/dL (ref 65–99)
PHOSPHORUS: 4.9 mg/dL — AB (ref 2.5–4.6)
POTASSIUM: 3.6 mmol/L (ref 3.5–5.1)
SODIUM: 134 mmol/L — AB (ref 135–145)

## 2014-12-25 LAB — GLUCOSE, CAPILLARY
GLUCOSE-CAPILLARY: 143 mg/dL — AB (ref 65–99)
GLUCOSE-CAPILLARY: 138 mg/dL — AB (ref 65–99)
GLUCOSE-CAPILLARY: 98 mg/dL (ref 65–99)

## 2014-12-25 LAB — HEMOGLOBIN A1C
HEMOGLOBIN A1C: 6.7 % — AB (ref 4.8–5.6)
MEAN PLASMA GLUCOSE: 146 mg/dL

## 2014-12-25 MED ORDER — ALTEPLASE 2 MG IJ SOLR
2.0000 mg | Freq: Once | INTRAMUSCULAR | Status: DC | PRN
  Filled 2014-12-25: qty 2

## 2014-12-25 MED ORDER — LIDOCAINE HCL (PF) 1 % IJ SOLN: 5.0000 mL | INTRAMUSCULAR | Status: DC | PRN

## 2014-12-25 MED ORDER — LIDOCAINE HCL 1 % IJ SOLN
INTRAMUSCULAR | Status: AC
  Filled 2014-12-25: qty 20

## 2014-12-25 MED ORDER — HEPARIN SODIUM (PORCINE) 1000 UNIT/ML DIALYSIS
100.0000 [IU]/kg | Freq: Once | INTRAMUSCULAR | Status: DC
  Filled 2014-12-25: qty 13

## 2014-12-25 MED ORDER — PENTAFLUOROPROP-TETRAFLUOROETH EX AERO: 1.0000 "application " | INHALATION_SPRAY | CUTANEOUS | Status: DC | PRN

## 2014-12-25 MED ORDER — SODIUM CHLORIDE 0.9 % IV SOLN
100.0000 mL | INTRAVENOUS | Status: DC | PRN
Start: 2014-12-25 — End: 2014-12-25

## 2014-12-25 MED ORDER — SODIUM CHLORIDE 0.9 % IV SOLN: 100.0000 mL | INTRAVENOUS | Status: DC | PRN

## 2014-12-25 MED ORDER — HEPARIN SODIUM (PORCINE) 1000 UNIT/ML DIALYSIS: 1000.0000 [IU] | INTRAMUSCULAR | Status: DC | PRN

## 2014-12-25 MED ORDER — CHLORHEXIDINE GLUCONATE 4 % EX LIQD
CUTANEOUS | Status: AC
Start: 2014-12-25 — End: 2014-12-26
  Filled 2014-12-25: qty 15

## 2014-12-25 MED ORDER — SODIUM CHLORIDE 0.9 % IV SOLN
3.0000 g | INTRAVENOUS | Status: DC
  Administered 2014-12-25 – 2014-12-29 (×5): 3 g via INTRAVENOUS
  Filled 2014-12-25 (×5): qty 3

## 2014-12-25 MED ORDER — LIDOCAINE-PRILOCAINE 2.5-2.5 % EX CREA
1.0000 "application " | TOPICAL_CREAM | CUTANEOUS | Status: DC | PRN
  Filled 2014-12-25: qty 5

## 2014-12-25 NOTE — Progress Notes (Signed)
Subjective:   Feels well this AM. Plan for cath removal post HD today  Objective Filed Vitals:   12/25/14 0730 12/25/14 0800 12/25/14 0830 12/25/14 0855  BP: 117/40 97/38 104/28 103/52  Pulse: 62 62 58 57  Temp:      TempSrc:      Resp:      Height:      Weight:      SpO2:       Physical Exam General: alert and oriented. No acute distress.  On HD Heart: RRR  Lungs: CTA/dim, unlabored Abdomen: morbidly obese. Soft, nontender +BS  Extremities: trace LE edema  Dialysis Access: R avf accessed-  R IJ cath  Dialysis Orders: [facility] 4.25 180 400/800 EDW 123 3 K 2.5 Ca profile 2 heparin 13,000 venofer 100 through 9/20 Mircer 100 q 2 weeks last given 9/6 calcitriol 0.75 Recent: Hgb 10.8 9/1 13% sat iPH 327 have been using 1 needle in AVF The past 3 treatments at Qb 250  Assessment/Plan: 1. AMS/febrile illness - feels better. tmax 100.4 last night. Blood cultures Gram + cocci/enterococcus. UTI. On Vanc and Rocephin. HD cath likely source of bacteremia- cath to be removed post HD 2. ESRD - TTS - AVF did not run well w 2 needles today. Using Cath.  K+3.6- use 4K bath  3. Hypertension/volume - BP here variable but overall controlled. Given morbid obesity, left lymphedema, it is difficult to assess volume status; on coreg 6.25 bid at home  4. Anemia - Hgb 9.7- watch CBC - just dosed 9/6 5. Metabolic bone disease - phos 4.9 Continue calcitriol; not on binder or sensipar 6. Nutrition - morbidly obese. EDW lowered some since starting HD, but most decrease likely to volume removal. Renal diet. alb 2.6 7. Hx breast cancer s/p left mastectomy - chronic left arm lymphedema 8. NICM EF 30%, recent NSTEMI low dose BB 9. DM 10. HX OSA - referred to pul 8/24 by PCP- has used CPAP in the past  Shelle Iron, NP [place] [phone] 12/25/2014,9:54 AM  LOS: 2 days   Pt seen, examined and agree w A/P as above.  Kelly Splinter MD pager [phone]    cell  [phone] 12/25/2014, 10:50 AM     Additional Objective Labs: Basic Metabolic Panel:  Recent Labs Lab 12/24/14 0710 12/25/14 0552 12/25/14 0709  NA 136 135 134*  K 3.7 3.6 3.6  CL 98* 97* 97*  CO2 28 26 26   GLUCOSE 164* 91 98  BUN 30* 38* 39*  CREATININE 6.05* 7.75* 7.79*  CALCIUM 8.6* 8.5* 8.6*  PHOS 4.1  --  4.9*   Liver Function Tests:  Recent Labs Lab 12/24/14 0710 12/25/14 0709  ALBUMIN 2.8* 2.6*   No results for input(s): LIPASE, AMYLASE in the last 168 hours. CBC:  Recent Labs Lab 12/23/14 0900 12/24/14 0710 12/25/14 0552 12/25/14 0709  WBC 14.1* 10.8* 10.7* 10.9*  HGB 11.0* 9.9* 9.2* 9.7*  HCT 36.0 32.8* 31.1* 31.7*  MCV 82.4 82.4 83.2 82.6  PLT 232 245 256 245   Blood Culture    Component Value Date/Time   SDES BLOOD LEFT HAND 12/23/2014 1055   SPECREQUEST BOTTLES DRAWN AEROBIC ONLY 3CC 12/23/2014 1055   CULT  12/23/2014 1055    ENTEROCOCCUS SPECIES SUSCEPTIBILITIES PERFORMED ON PREVIOUS CULTURE WITHIN THE LAST 5 DAYS.    REPTSTATUS 12/25/2014 FINAL 12/23/2014 1055    Cardiac Enzymes: No results for input(s): CKTOTAL, CKMB, CKMBINDEX, TROPONINI in the last 168 hours. CBG:  Recent  Labs Lab 12/24/14 0746 12/24/14 1220 12/24/14 1648 12/24/14 2109 12/24/14 2155  GLUCAP 144* 161* 323* 66 89   Iron Studies: No results for input(s): IRON, TIBC, TRANSFERRIN, FERRITIN in the last 72 hours. @lablastinr3 @ Studies/Results: No results found. Medications:   . calcitRIOL  0.75 mcg Oral Q T,Th,Sa-HD  . carvedilol  6.25 mg Oral BID WC  . cefTRIAXone (ROCEPHIN)  IV  2 g Intravenous Q24H  . [START ON 12/26/2014] heparin  100 Units/kg Dialysis Once in dialysis  . heparin  5,000 Units Subcutaneous 3 times per day  . insulin aspart  0-9 Units Subcutaneous TID WC  . insulin detemir  70 Units Subcutaneous Q24H  . sodium chloride  3 mL Intravenous Q12H  . vancomycin  1,000 mg Intravenous Q T,Th,Sa-HD

## 2014-12-25 NOTE — Progress Notes (Signed)
PT Cancellation Note  Patient Details Name: Jordan [name] MRN: [id number] DOB: [birth year]   Cancelled Treatment:    Reason Eval/Treat Not Completed: Fatigue/lethargy limiting ability to participate;Patient declined, no reason specified.  Attempted to see patient x3 today - needing to be cleaned, bathing, too fatigued.  Will return in am for PT evaluation.   Despina Pole 12/25/2014, 8:21 PM Carita Pian Sanjuana Kava, [place] Pager [phone]

## 2014-12-25 NOTE — Procedures (Signed)
Interventional Radiology Procedure Note  Procedure: Bedside removal of right IJ port, secondary to bacteremia.  Sharp/blunt dissection with local anesthesia. .  Complications: None Recommendations:  - Ok to shower tomorrow - Do not submerge for 7 days - Routine dressing care  - Line holiday, with new line next week.   Signed,  Dulcy Fanny. Earleen Newport, DO

## 2014-12-25 NOTE — Consult Note (Signed)
[facility] for Infectious Disease  Date of Admission:  12/23/2014  Date of Consult:  12/25/2014  Reason for Consult: Enterococcal Bacteremia Referring Physician: CHAMP  Impression/Recommendation Enterococcal Bacteremia (pan sens)  Will change her to unasyn  This will cover her enterococcus and her klebsiella  Await repeat BCx  Check TEE- she has a very noticeable murmur.     Line related bacteremia UTI (kleb pneumonia R-amp, I- nitro)  will change her anbx to unasyn   ESRD DM2 with nephropathy  Continue to follow FSG  Have been variable. Aim for better control  Non-ishc CM (EF 30%) Non-ST MI (July 2016)   Thank you so much for this interesting consult,   Bobby Rumpf (pager) [phone] [email or web address]  Jordan [name] is an 74 y.o. female.  HPI: 74 yo F with hx of DM2 x 25 yrs and ESRD, breast CA with LUE lymphedema. On 9-6 she had her usual HD and had to have this stopped early due to hypotension. She was brought to ED on 9-8 by her daughter after developing slurred speech and confusion. She also had a hx of f/c, n/v.  In ED she had WBC 14.1, temp 101.7. Her CT head was (-). She was started on ceftriaxone due to pyuria and also given vanco at HD.   Her BCx returned as GPC within 24h.  Her mental status had returned to baseline by this AM. Her HD cath ws removed this AM.   L AVF placed 10-26-14. Not yet mature.   Past Medical History  Diagnosis Date  . Breast cancer   . Diabetes mellitus without complication   . Hypertension   . Arthritis   . COPD (chronic obstructive pulmonary disease)   . Renal disorder     Family reports acute renal failure  . GERD (gastroesophageal reflux disease)   . CKD (chronic kidney disease)   . Systolic CHF, chronic 09/15/8364    Past Surgical History  Procedure Laterality Date  . Breast surgery Left   . Cardiac catheterization [name] 10/19/2014    Procedure: Right/Left Heart Cath and Coronary Angiography;   Surgeon: Peter M Martinique, MD;  Location: [facility];  Service: Cardiovascular;  Laterality: [name];  . Av fistula placement Right 10/26/2014    Procedure: Right Brachiocephalic Arteriovenous FISTULA CREATION;  Surgeon: Conrad [place], MD;  Location: [facility];  Service: Vascular;  Laterality: Right;     No Known Allergies  Medications:  Scheduled: . calcitRIOL  0.75 mcg Oral Q T,Th,Sa-HD  . carvedilol  6.25 mg Oral BID WC  . cefTRIAXone (ROCEPHIN)  IV  2 g Intravenous Q24H  . chlorhexidine      . heparin  5,000 Units Subcutaneous 3 times per day  . insulin aspart  0-9 Units Subcutaneous TID WC  . insulin detemir  70 Units Subcutaneous Q24H  . lidocaine      . sodium chloride  3 mL Intravenous Q12H  . vancomycin  1,000 mg Intravenous Q T,Th,Sa-HD    Abtx:  Anti-infectives    Start     Dose/Rate Route Frequency Ordered Stop   12/25/14 1800  cefTRIAXone (ROCEPHIN) 2 g in dextrose 5 % 50 mL IVPB  Status:  Discontinued     2 g 100 mL/hr over 30 Minutes Intravenous Every T-Th-Sa (1800) 12/24/14 1351 12/24/14 1420   12/25/14 1200  vancomycin (VANCOCIN) IVPB 1000 mg/200 mL premix     1,000 mg 200 mL/hr over 60 Minutes Intravenous Every T-Th-Sa (Hemodialysis) 12/23/14 1540  12/24/14 2000  cefTRIAXone (ROCEPHIN) 2 g in dextrose 5 % 50 mL IVPB     2 g 100 mL/hr over 30 Minutes Intravenous Every 24 hours 12/24/14 1420     12/23/14 1900  cefTRIAXone (ROCEPHIN) 1 g in dextrose 5 % 50 mL IVPB  Status:  Discontinued     1 g 100 mL/hr over 30 Minutes Intravenous Every 24 hours 12/23/14 1858 12/24/14 1420   12/23/14 1600  vancomycin (VANCOCIN) 2,000 mg in sodium chloride 0.9 % 250 mL IVPB     2,000 mg 250 mL/hr over 60 Minutes Intravenous  Once 12/23/14 1540 12/23/14 1734   12/23/14 1000  vancomycin (VANCOCIN) IVPB 1000 mg/200 mL premix     1,000 mg 200 mL/hr over 60 Minutes Intravenous  Once 12/23/14 0945 12/23/14 1129   12/23/14 1000  piperacillin-tazobactam (ZOSYN) IVPB 3.375 g     3.375  g 12.5 mL/hr over 240 Minutes Intravenous  Once 12/23/14 0945 12/23/14 1404      Total days of antibiotics: 2 vanco/ceftriaxone           Social History:  reports that she has never smoked. She does not have any smokeless tobacco history on file. She reports that she does not drink alcohol. Her drug history is not on file.  Family History  Problem Relation Age of Onset  . Diabetes Mother   . Hypertension Mother   . Stroke Sister   . Hypertension Sister   . Heart attack Neg Hx     General ROS: no change in vision, minimal u/o, normal BM, + paresthesias, no d/c from HD line, no pain at HD line site, nl apetite, chronic R > L LE edema. see HPI. o/w 12 point ros (-)  Blood pressure 101/47, pulse 60, temperature 97.9 F (36.6 C), temperature source Oral, resp. rate 21, height 5' 4"  (1.626 m), weight 121 kg (266 lb 12.1 oz), SpO2 100 %. General appearance: alert, cooperative, no distress and answers questions appropriately.  Eyes: negative findings: conjunctivae and sclerae normal and pupils equal, round, reactive to light and accomodation Throat: normal findings: oropharynx pink & moist without lesions or evidence of thrush Neck: no adenopathy and supple, symmetrical, trachea midline Lungs: clear to auscultation bilaterally Heart: regular rate and rhythm and systolic murmur: early systolic 3/6, crescendo at 2nd right intercostal space Abdomen: normal findings: bowel sounds normal and soft, non-tender Extremities: edema R > L LE. no diabetic foot ulcers. normal light touch in BLE (grossly) Skin: dressing R upper chest, clean.    Results for orders placed or performed during the hospital encounter of 12/23/14 (from the past 48 hour(s))  Glucose, capillary     Status: Abnormal   Collection Time: 12/23/14  6:56 PM  Result Value Ref Range   Glucose-Capillary 158 (H) 65 - 99 mg/dL  Glucose, capillary     Status: Abnormal   Collection Time: 12/23/14  9:24 PM  Result Value Ref Range    Glucose-Capillary 196 (H) 65 - 99 mg/dL  Hemoglobin A1c     Status: Abnormal   Collection Time: 12/24/14  7:10 AM  Result Value Ref Range   Hgb A1c MFr Bld 6.7 (H) 4.8 - 5.6 %    Comment: (NOTE)         Pre-diabetes: 5.7 - 6.4         Diabetes: >6.4         Glycemic control for adults with diabetes: <7.0    Mean Plasma Glucose 146 mg/dL  Comment: (NOTE) Performed At: [facility] [place], [place] [id number] Lindon Romp MD [id number]   CBC     Status: Abnormal   Collection Time: 12/24/14  7:10 AM  Result Value Ref Range   WBC 10.8 (H) 4.0 - 10.5 K/uL   RBC 3.98 3.87 - 5.11 MIL/uL   Hemoglobin 9.9 (L) 12.0 - 15.0 g/dL   HCT 32.8 (L) 36.0 - 46.0 %   MCV 82.4 78.0 - 100.0 fL   MCH 24.9 (L) 26.0 - 34.0 pg   MCHC 30.2 30.0 - 36.0 g/dL   RDW 16.1 (H) 11.5 - 15.5 %   Platelets 245 150 - 400 K/uL  Renal function panel     Status: Abnormal   Collection Time: 12/24/14  7:10 AM  Result Value Ref Range   Sodium 136 135 - 145 mmol/L   Potassium 3.7 3.5 - 5.1 mmol/L    Comment: DIALYSIS   Chloride 98 (L) 101 - 111 mmol/L   CO2 28 22 - 32 mmol/L   Glucose, Bld 164 (H) 65 - 99 mg/dL   BUN 30 (H) 6 - 20 mg/dL    Comment: DIALYSIS   Creatinine, Ser 6.05 (H) 0.44 - 1.00 mg/dL    Comment: DIALYSIS   Calcium 8.6 (L) 8.9 - 10.3 mg/dL   Phosphorus 4.1 2.5 - 4.6 mg/dL   Albumin 2.8 (L) 3.5 - 5.0 g/dL   GFR calc non Af Amer 6 (L) >60 mL/min   GFR calc Af Amer 7 (L) >60 mL/min    Comment: (NOTE) The eGFR has been calculated using the CKD EPI equation. This calculation has not been validated in all clinical situations. eGFR's persistently <60 mL/min signify possible Chronic Kidney Disease.    Anion gap 10 5 - 15  Glucose, capillary     Status: Abnormal   Collection Time: 12/24/14  7:46 AM  Result Value Ref Range   Glucose-Capillary 144 (H) 65 - 99 mg/dL  Glucose, capillary     Status: Abnormal   Collection Time: 12/24/14 12:20 PM  Result Value Ref Range    Glucose-Capillary 161 (H) 65 - 99 mg/dL  Culture, blood (routine x 2)     Status: None (Preliminary result)   Collection Time: 12/24/14  3:03 PM  Result Value Ref Range   Specimen Description BLOOD LEFT HAND    Special Requests BOTTLES DRAWN AEROBIC AND ANAEROBIC 5CC EA    Culture NO GROWTH < 24 HOURS    Report Status PENDING   Culture, blood (routine x 2)     Status: None (Preliminary result)   Collection Time: 12/24/14  3:25 PM  Result Value Ref Range   Specimen Description BLOOD LEFT WRIST    Special Requests BOTTLES DRAWN AEROBIC AND ANAEROBIC 5CC    Culture NO GROWTH < 24 HOURS    Report Status PENDING   Glucose, capillary     Status: Abnormal   Collection Time: 12/24/14  4:48 PM  Result Value Ref Range   Glucose-Capillary 323 (H) 65 - 99 mg/dL  Glucose, capillary     Status: None   Collection Time: 12/24/14  9:09 PM  Result Value Ref Range   Glucose-Capillary 66 65 - 99 mg/dL  Glucose, capillary     Status: None   Collection Time: 12/24/14  9:55 PM  Result Value Ref Range   Glucose-Capillary 89 65 - 99 mg/dL  CBC     Status: Abnormal   Collection Time: 12/25/14  5:52 AM  Result Value Ref Range   WBC 10.7 (H) 4.0 - 10.5 K/uL   RBC 3.74 (L) 3.87 - 5.11 MIL/uL   Hemoglobin 9.2 (L) 12.0 - 15.0 g/dL   HCT 31.1 (L) 36.0 - 46.0 %   MCV 83.2 78.0 - 100.0 fL   MCH 24.6 (L) 26.0 - 34.0 pg   MCHC 29.6 (L) 30.0 - 36.0 g/dL   RDW 16.3 (H) 11.5 - 15.5 %   Platelets 256 150 - 400 K/uL  Basic metabolic panel     Status: Abnormal   Collection Time: 12/25/14  5:52 AM  Result Value Ref Range   Sodium 135 135 - 145 mmol/L   Potassium 3.6 3.5 - 5.1 mmol/L   Chloride 97 (L) 101 - 111 mmol/L   CO2 26 22 - 32 mmol/L   Glucose, Bld 91 65 - 99 mg/dL   BUN 38 (H) 6 - 20 mg/dL   Creatinine, Ser 7.75 (H) 0.44 - 1.00 mg/dL   Calcium 8.5 (L) 8.9 - 10.3 mg/dL   GFR calc non Af Amer 5 (L) >60 mL/min   GFR calc Af Amer 5 (L) >60 mL/min    Comment: (NOTE) The eGFR has been calculated using  the CKD EPI equation. This calculation has not been validated in all clinical situations. eGFR's persistently <60 mL/min signify possible Chronic Kidney Disease.    Anion gap 12 5 - 15  Renal function panel     Status: Abnormal   Collection Time: 12/25/14  7:09 AM  Result Value Ref Range   Sodium 134 (L) 135 - 145 mmol/L   Potassium 3.6 3.5 - 5.1 mmol/L   Chloride 97 (L) 101 - 111 mmol/L   CO2 26 22 - 32 mmol/L   Glucose, Bld 98 65 - 99 mg/dL   BUN 39 (H) 6 - 20 mg/dL   Creatinine, Ser 7.79 (H) 0.44 - 1.00 mg/dL   Calcium 8.6 (L) 8.9 - 10.3 mg/dL   Phosphorus 4.9 (H) 2.5 - 4.6 mg/dL   Albumin 2.6 (L) 3.5 - 5.0 g/dL   GFR calc non Af Amer 5 (L) >60 mL/min   GFR calc Af Amer 5 (L) >60 mL/min    Comment: (NOTE) The eGFR has been calculated using the CKD EPI equation. This calculation has not been validated in all clinical situations. eGFR's persistently <60 mL/min signify possible Chronic Kidney Disease.    Anion gap 11 5 - 15  CBC     Status: Abnormal   Collection Time: 12/25/14  7:09 AM  Result Value Ref Range   WBC 10.9 (H) 4.0 - 10.5 K/uL   RBC 3.84 (L) 3.87 - 5.11 MIL/uL   Hemoglobin 9.7 (L) 12.0 - 15.0 g/dL   HCT 31.7 (L) 36.0 - 46.0 %   MCV 82.6 78.0 - 100.0 fL   MCH 25.3 (L) 26.0 - 34.0 pg   MCHC 30.6 30.0 - 36.0 g/dL   RDW 16.3 (H) 11.5 - 15.5 %   Platelets 245 150 - 400 K/uL  Glucose, capillary     Status: Abnormal   Collection Time: 12/25/14 12:04 PM  Result Value Ref Range   Glucose-Capillary 143 (H) 65 - 99 mg/dL      Component Value Date/Time   SDES BLOOD LEFT WRIST 12/24/2014 1525   SPECREQUEST BOTTLES DRAWN AEROBIC AND ANAEROBIC 5CC 12/24/2014 1525   CULT NO GROWTH < 24 HOURS 12/24/2014 1525   REPTSTATUS PENDING 12/24/2014 1525   No results found. Recent Results (from the past 240  hour(s))  Urine culture     Status: None   Collection Time: 12/23/14  9:41 AM  Result Value Ref Range Status   Specimen Description URINE, CATHETERIZED  Final   Special  Requests Normal  Final   Culture >=100,000 COLONIES/mL KLEBSIELLA PNEUMONIAE  Final   Report Status 12/25/2014 FINAL  Final   Organism ID, Bacteria KLEBSIELLA PNEUMONIAE  Final      Susceptibility   Klebsiella pneumoniae - MIC*    AMPICILLIN >=32 RESISTANT Resistant     CEFAZOLIN <=4 SENSITIVE Sensitive     CEFTRIAXONE <=1 SENSITIVE Sensitive     CIPROFLOXACIN <=0.25 SENSITIVE Sensitive     GENTAMICIN <=1 SENSITIVE Sensitive     IMIPENEM <=0.25 SENSITIVE Sensitive     NITROFURANTOIN 64 INTERMEDIATE Intermediate     TRIMETH/SULFA <=20 SENSITIVE Sensitive     AMPICILLIN/SULBACTAM 8 SENSITIVE Sensitive     PIP/TAZO <=4 SENSITIVE Sensitive     * >=100,000 COLONIES/mL KLEBSIELLA PNEUMONIAE  Culture, blood (routine x 2)     Status: None   Collection Time: 12/23/14 10:05 AM  Result Value Ref Range Status   Specimen Description BLOOD LEFT ANTECUBITAL  Final   Special Requests BOTTLES DRAWN AEROBIC AND ANAEROBIC 5CC  Final   Culture  Setup Time   Final    GRAM POSITIVE COCCI IN CLUSTERS IN BOTH AEROBIC AND ANAEROBIC BOTTLES CRITICAL RESULT CALLED TO, READ BACK BY AND VERIFIED WITH: M MURPHY@0057  12/24/14 MKELLY    Culture ENTEROCOCCUS SPECIES  Final   Report Status 12/25/2014 FINAL  Final   Organism ID, Bacteria ENTEROCOCCUS SPECIES  Final      Susceptibility   Enterococcus species - MIC*    AMPICILLIN <=2 SENSITIVE Sensitive     VANCOMYCIN 2 SENSITIVE Sensitive     GENTAMICIN SYNERGY SENSITIVE Sensitive     LINEZOLID 2 SENSITIVE Sensitive     * ENTEROCOCCUS SPECIES  Culture, blood (routine x 2)     Status: None   Collection Time: 12/23/14 10:55 AM  Result Value Ref Range Status   Specimen Description BLOOD LEFT HAND  Final   Special Requests BOTTLES DRAWN AEROBIC ONLY 3CC  Final   Culture  Setup Time   Final    GRAM POSITIVE COCCI IN CLUSTERS AEROBIC BOTTLE ONLY CRITICAL RESULT CALLED TO, READ BACK BY AND VERIFIED WITH: T [email or web address]@[id number]  12/24/14 MKELLY    Culture   Final     ENTEROCOCCUS SPECIES SUSCEPTIBILITIES PERFORMED ON PREVIOUS CULTURE WITHIN THE LAST 5 DAYS.    Report Status 12/25/2014 FINAL  Final  Culture, blood (routine x 2)     Status: None (Preliminary result)   Collection Time: 12/24/14  3:03 PM  Result Value Ref Range Status   Specimen Description BLOOD LEFT HAND  Final   Special Requests BOTTLES DRAWN AEROBIC AND ANAEROBIC 5CC EA  Final   Culture NO GROWTH < 24 HOURS  Final   Report Status PENDING  Incomplete  Culture, blood (routine x 2)     Status: None (Preliminary result)   Collection Time: 12/24/14  3:25 PM  Result Value Ref Range Status   Specimen Description BLOOD LEFT WRIST  Final   Special Requests BOTTLES DRAWN AEROBIC AND ANAEROBIC 5CC  Final   Culture NO GROWTH < 24 HOURS  Final   Report Status PENDING  Incomplete      12/25/2014, 1:33 PM     LOS: 2 days    Records and images were personally reviewed where available.

## 2014-12-25 NOTE — Progress Notes (Signed)
[facility] PROGRESS NOTE  Jordan [name] [id number] DOB: 07-23-1940 DOA: 12/23/2014 PCP: [name], MD  Assessment/Plan: 1. Enterococcal  Sepsis  -Fever, blood cultures with Enterococcus suspect right chest HD catheter is the source -Continue IV vancomycin, will d/w ID -Plan for HD catheter removal after dialysis today and a line holiday over the weekend -Repeat blood cultures, NGTD for 24H  2. Metabolic encephalopathy -Due to sepsis, mentation back to baseline, no focal signs, CT head and unremarkable  3. ESRD - -has an AV fistula in the right arm which was placed in July, not mature yet, plan to remove HD catheter after HD today -Will need new dialysis catheter placed probably on Tuesday, if repeat blood cultures stay negative  4. Hypertension/volume -  -Stable, on coreg   5. Anemia of chronic disease and iron deficiency -Stable, continue Aranesp  6. History of breast cancer status post left mastectomy and chronic left arm lymphedema  7. History of chronic systolic CHF/NICM, EF [id number],  -volume managed with HD -Continue Coreg  8. Diabetes mellitus -Stable continue Levemir, sliding scale insulin will follow HbA1c  9. OSA - referred to pul 8/24 by PCP- has used CPAP in the past  10. Klebsiella UTI  -continue rocephin, FU urine Cx  Morbid obesity  DVT prophylaxis : Heparin subcutaneous    Code Status: Full Code Family Communication: none at bedside Disposition Plan: Home when improved   Consultants:  Renal  HPI/Subjective: Feels ok, seen in HD, no specific complaints  Objective: Filed Vitals:   12/25/14 1116  BP: 101/47  Pulse: 60  Temp: 97.9 F (36.6 C)  Resp: 21    Intake/Output Summary (Last 24 hours) at 12/25/14 1350 Last data filed at 12/25/14 1116  Gross per 24 hour  Intake    542 ml  Output   3000 ml  Net  -2458 ml   Filed Weights   12/23/14 1903 12/25/14 0640 12/25/14 1116  Weight: 125.2 kg (276 lb 0.3 oz) 127 kg  (279 lb 15.8 oz) 121 kg (266 lb 12.1 oz)    Exam:   General:  AAOx3  Cardiovascular: S1S2/RRR  HEENT: HD cath in R chest  Respiratory: CTAB  Abdomen: soft, NT, BSpresent  Musculoskeletal: no edema c/c   Data Reviewed: Basic Metabolic Panel:  Recent Labs Lab 12/23/14 0900 12/23/14 0914 12/24/14 0710 12/25/14 0552 12/25/14 0709  NA 134*  --  136 135 134*  K 4.7  --  3.7 3.6 3.6  CL 97*  --  98* 97* 97*  CO2 24  --  28 26 26   GLUCOSE 235*  --  164* 91 98  BUN 62*  --  30* 38* 39*  CREATININE 9.26* 9.10* 6.05* 7.75* 7.79*  CALCIUM 8.9  --  8.6* 8.5* 8.6*  MG 2.0  --   --   --   --   PHOS  --   --  4.1  --  4.9*   Liver Function Tests:  Recent Labs Lab 12/24/14 0710 12/25/14 0709  ALBUMIN 2.8* 2.6*   No results for input(s): LIPASE, AMYLASE in the last 168 hours. No results for input(s): AMMONIA in the last 168 hours. CBC:  Recent Labs Lab 12/23/14 0900 12/24/14 0710 12/25/14 0552 12/25/14 0709  WBC 14.1* 10.8* 10.7* 10.9*  HGB 11.0* 9.9* 9.2* 9.7*  HCT 36.0 32.8* 31.1* 31.7*  MCV 82.4 82.4 83.2 82.6  PLT 232 245 256 245   Cardiac Enzymes: No results for input(s): CKTOTAL, CKMB, CKMBINDEX, TROPONINI in  the last 168 hours. BNP (last 3 results)  Recent Labs  10/12/14 0100  BNP 143.8*    ProBNP (last 3 results) No results for input(s): PROBNP in the last 8760 hours.  CBG:  Recent Labs Lab 12/24/14 1220 12/24/14 1648 12/24/14 2109 12/24/14 2155 12/25/14 1204  GLUCAP 161* 323* 66 89 143*    Recent Results (from the past 240 hour(s))  Urine culture     Status: None   Collection Time: 12/23/14  9:41 AM  Result Value Ref Range Status   Specimen Description URINE, CATHETERIZED  Final   Special Requests Normal  Final   Culture >=100,000 COLONIES/mL KLEBSIELLA PNEUMONIAE  Final   Report Status 12/25/2014 FINAL  Final   Organism ID, Bacteria KLEBSIELLA PNEUMONIAE  Final      Susceptibility   Klebsiella pneumoniae - MIC*    AMPICILLIN  >=32 RESISTANT Resistant     CEFAZOLIN <=4 SENSITIVE Sensitive     CEFTRIAXONE <=1 SENSITIVE Sensitive     CIPROFLOXACIN <=0.25 SENSITIVE Sensitive     GENTAMICIN <=1 SENSITIVE Sensitive     IMIPENEM <=0.25 SENSITIVE Sensitive     NITROFURANTOIN 64 INTERMEDIATE Intermediate     TRIMETH/SULFA <=20 SENSITIVE Sensitive     AMPICILLIN/SULBACTAM 8 SENSITIVE Sensitive     PIP/TAZO <=4 SENSITIVE Sensitive     * >=100,000 COLONIES/mL KLEBSIELLA PNEUMONIAE  Culture, blood (routine x 2)     Status: None   Collection Time: 12/23/14 10:05 AM  Result Value Ref Range Status   Specimen Description BLOOD LEFT ANTECUBITAL  Final   Special Requests BOTTLES DRAWN AEROBIC AND ANAEROBIC 5CC  Final   Culture  Setup Time   Final    GRAM POSITIVE COCCI IN CLUSTERS IN BOTH AEROBIC AND ANAEROBIC BOTTLES CRITICAL RESULT CALLED TO, READ BACK BY AND VERIFIED WITH: M MURPHY@0057  12/24/14 MKELLY    Culture ENTEROCOCCUS SPECIES  Final   Report Status 12/25/2014 FINAL  Final   Organism ID, Bacteria ENTEROCOCCUS SPECIES  Final      Susceptibility   Enterococcus species - MIC*    AMPICILLIN <=2 SENSITIVE Sensitive     VANCOMYCIN 2 SENSITIVE Sensitive     GENTAMICIN SYNERGY SENSITIVE Sensitive     LINEZOLID 2 SENSITIVE Sensitive     * ENTEROCOCCUS SPECIES  Culture, blood (routine x 2)     Status: None   Collection Time: 12/23/14 10:55 AM  Result Value Ref Range Status   Specimen Description BLOOD LEFT HAND  Final   Special Requests BOTTLES DRAWN AEROBIC ONLY 3CC  Final   Culture  Setup Time   Final    GRAM POSITIVE COCCI IN CLUSTERS AEROBIC BOTTLE ONLY CRITICAL RESULT CALLED TO, READ BACK BY AND VERIFIED WITH: T ISAACS@0316  12/24/14 MKELLY    Culture   Final    ENTEROCOCCUS SPECIES SUSCEPTIBILITIES PERFORMED ON PREVIOUS CULTURE WITHIN THE LAST 5 DAYS.    Report Status 12/25/2014 FINAL  Final  Culture, blood (routine x 2)     Status: None (Preliminary result)   Collection Time: 12/24/14  3:03 PM  Result  Value Ref Range Status   Specimen Description BLOOD LEFT HAND  Final   Special Requests BOTTLES DRAWN AEROBIC AND ANAEROBIC 5CC EA  Final   Culture NO GROWTH < 24 HOURS  Final   Report Status PENDING  Incomplete  Culture, blood (routine x 2)     Status: None (Preliminary result)   Collection Time: 12/24/14  3:25 PM  Result Value Ref Range Status   Specimen Description  BLOOD LEFT WRIST  Final   Special Requests BOTTLES DRAWN AEROBIC AND ANAEROBIC 5CC  Final   Culture NO GROWTH < 24 HOURS  Final   Report Status PENDING  Incomplete     Studies: No results found.  Scheduled Meds: . calcitRIOL  0.75 mcg Oral Q T,Th,Sa-HD  . carvedilol  6.25 mg Oral BID WC  . cefTRIAXone (ROCEPHIN)  IV  2 g Intravenous Q24H  . chlorhexidine      . heparin  5,000 Units Subcutaneous 3 times per day  . insulin aspart  0-9 Units Subcutaneous TID WC  . insulin detemir  70 Units Subcutaneous Q24H  . lidocaine      . sodium chloride  3 mL Intravenous Q12H  . vancomycin  1,000 mg Intravenous Q T,Th,Sa-HD   Continuous Infusions:  Antibiotics Given (last 72 hours)    Date/Time Action Medication Dose Rate   12/23/14 1634 Given   vancomycin (VANCOCIN) 2,000 mg in sodium chloride 0.9 % 250 mL IVPB 2,000 mg 250 mL/hr   12/23/14 2055 Given   cefTRIAXone (ROCEPHIN) 1 g in dextrose 5 % 50 mL IVPB 1 g 100 mL/hr   12/24/14 2240 Given   cefTRIAXone (ROCEPHIN) 2 g in dextrose 5 % 50 mL IVPB 2 g 100 mL/hr   12/25/14 1016 Given  [during HD]   vancomycin (VANCOCIN) IVPB 1000 mg/200 mL premix 1,000 mg 200 mL/hr      Principal Problem:   Acute encephalopathy Active Problems:   Breast cancer   Hypertension   COPD (chronic obstructive pulmonary disease)   Essential hypertension   ESRD (end stage renal disease) on dialysis   Type 2 diabetes mellitus with diabetic nephropathy   Nonischemic cardiomyopathy   Morbidly obese   OSA (obstructive sleep apnea)   Constipation   UTI (lower urinary tract infection)    Systolic CHF, chronic   Encephalopathy, metabolic    Time spent: 99991111    Garreth Burnsworth  [facility] Pager [phone]. If 7PM-7AM, please contact night-coverage at [email or web address], password [facility] 12/25/2014, 1:50 PM  LOS: 2 days

## 2014-12-26 DIAGNOSIS — R7881 Bacteremia: Secondary | ICD-10-CM

## 2014-12-26 DIAGNOSIS — B952 Enterococcus as the cause of diseases classified elsewhere: Secondary | ICD-10-CM

## 2014-12-26 HISTORY — DX: Enterococcus as the cause of diseases classified elsewhere: B95.2

## 2014-12-26 HISTORY — DX: Enterococcus as the cause of diseases classified elsewhere: R78.81

## 2014-12-26 LAB — CBC
HCT: 34.6 % — ABNORMAL LOW (ref 36.0–46.0)
HEMOGLOBIN: 10 g/dL — AB (ref 12.0–15.0)
MCH: 24.2 pg — ABNORMAL LOW (ref 26.0–34.0)
MCHC: 28.9 g/dL — AB (ref 30.0–36.0)
MCV: 83.8 fL (ref 78.0–100.0)
PLATELETS: 280 10*3/uL (ref 150–400)
RBC: 4.13 MIL/uL (ref 3.87–5.11)
RDW: 16.4 % — ABNORMAL HIGH (ref 11.5–15.5)
WBC: 12.6 10*3/uL — ABNORMAL HIGH (ref 4.0–10.5)

## 2014-12-26 LAB — BASIC METABOLIC PANEL
Anion gap: 12 (ref 5–15)
BUN: 24 mg/dL — ABNORMAL HIGH (ref 6–20)
CO2: 26 mmol/L (ref 22–32)
Calcium: 8.8 mg/dL — ABNORMAL LOW (ref 8.9–10.3)
Chloride: 98 mmol/L — ABNORMAL LOW (ref 101–111)
Creatinine, Ser: 6.43 mg/dL — ABNORMAL HIGH (ref 0.44–1.00)
GFR calc Af Amer: 7 mL/min — ABNORMAL LOW (ref >60)
GFR calc non Af Amer: 6 mL/min — ABNORMAL LOW (ref >60)
Glucose, Bld: 102 mg/dL — ABNORMAL HIGH (ref 65–99)
Potassium: 3.8 mmol/L (ref 3.5–5.1)
Sodium: 136 mmol/L (ref 135–145)

## 2014-12-26 LAB — GLUCOSE, CAPILLARY
GLUCOSE-CAPILLARY: 142 mg/dL — AB (ref 65–99)
GLUCOSE-CAPILLARY: 151 mg/dL — AB (ref 65–99)
GLUCOSE-CAPILLARY: 139 mg/dL — AB (ref 65–99)
Glucose-Capillary: 136 mg/dL — ABNORMAL HIGH (ref 65–99)

## 2014-12-26 NOTE — Progress Notes (Signed)
CM in room to meet with patient, Jordan [name] at bedside comforting patient. CM will return later.

## 2014-12-26 NOTE — Progress Notes (Signed)
PT Cancellation Note  Patient Details Name: Jordan [name] MRN: [id number] DOB: 1940-05-09   Cancelled Treatment:    Reason Eval/Treat Not Completed: Fatigue/lethargy limiting ability to participate.  Attempted to see patient x2. Patient declined due to feeling poorly, tired.  Will return tomorrow to attempt PT evaluation.   Despina Pole 12/26/2014, 7:46 PM Carita Pian. Sanjuana Kava, [place] Pager [phone]

## 2014-12-26 NOTE — Progress Notes (Signed)
[facility] PROGRESS NOTE  Jordan [name] [id number] DOB: 11-21-1940 DOA: 12/23/2014 PCP: [name], MD  Assessment/Plan: 1. Enterococcal  Sepsis  -Blood cultures with Enterococcus suspect right chest HD catheter is the source -Vanc changed to IV Unasyn 9/10, ID consult appreciated -HD catheter removed 9/10 -repeat Blood Cx NGTD -d/w Cards for TEE for tomorrow -will need new HD access Tuesday, if repeat cultures stay negative  2. Metabolic encephalopathy -Due to sepsis, mentation back to baseline, no focal signs, CT head and unremarkable  3. ESRD - -has an AV fistula in the right arm which was placed in July, not mature yet,  HD catheter removed 9/10 -Will need new dialysis catheter placed probably on Tuesday, if repeat blood cultures stay negative -Per Renal  4. Hypertension/volume -  -Stable, on coreg   5. Anemia of chronic disease and iron deficiency -Stable, continue Aranesp  6. History of breast cancer status post left mastectomy and chronic left arm lymphedema  7. History of chronic systolic CHF/NICM, EF A999333,  -volume managed with HD -Continue Coreg  8. Diabetes mellitus -Stable continue Levemir, sliding scale insulin will follow HbA1c  9. OSA - referred to pul 8/24 by PCP- has used CPAP in the past  10. Klebsiella UTI  -Now on Unasyn to cover both urine and Enterococcus in blood  Morbid obesity  DVT prophylaxis : Heparin subcutaneous    Code Status: Full Code Family Communication: none at bedside Disposition Plan: Home when improved   Consultants:  Renal  HPI/Subjective: Feels ok, nausea after breakfast this am  Objective: Filed Vitals:   12/26/14 0750  BP: 115/35  Pulse: 65  Temp: 98.2 F (36.8 C)  Resp: 18    Intake/Output Summary (Last 24 hours) at 12/26/14 0947 Last data filed at 12/26/14 0900  Gross per 24 hour  Intake   1040 ml  Output   3000 ml  Net  -1960 ml   Filed Weights   12/25/14 0640 12/25/14  1116 12/25/14 2117  Weight: 127 kg (279 lb 15.8 oz) 121 kg (266 lb 12.1 oz) 121.6 kg (268 lb 1.3 oz)    Exam:   General:  AAOx3, obese, pleasant  Cardiovascular: S1S2/RRR  HEENT: dressing at site of prior HD cath  Respiratory: CTAB  Abdomen: soft, NT, BSpresent  Musculoskeletal: no edema c/c   Data Reviewed: Basic Metabolic Panel:  Recent Labs Lab 12/23/14 0900 12/23/14 0914 12/24/14 0710 12/25/14 0552 12/25/14 0709 12/26/14 0608  NA 134*  --  136 135 134* 136  K 4.7  --  3.7 3.6 3.6 3.8  CL 97*  --  98* 97* 97* 98*  CO2 24  --  28 26 26 26   GLUCOSE 235*  --  164* 91 98 102*  BUN 62*  --  30* 38* 39* 24*  CREATININE 9.26* 9.10* 6.05* 7.75* 7.79* 6.43*  CALCIUM 8.9  --  8.6* 8.5* 8.6* 8.8*  MG 2.0  --   --   --   --   --   PHOS  --   --  4.1  --  4.9*  --    Liver Function Tests:  Recent Labs Lab 12/24/14 0710 12/25/14 0709  ALBUMIN 2.8* 2.6*   No results for input(s): LIPASE, AMYLASE in the last 168 hours. No results for input(s): AMMONIA in the last 168 hours. CBC:  Recent Labs Lab 12/23/14 0900 12/24/14 0710 12/25/14 0552 12/25/14 0709 12/26/14 0608  WBC 14.1* 10.8* 10.7* 10.9* 12.6*  HGB 11.0* 9.9* 9.2*  9.7* 10.0*  HCT 36.0 32.8* 31.1* 31.7* 34.6*  MCV 82.4 82.4 83.2 82.6 83.8  PLT 232 245 256 245 280   Cardiac Enzymes: No results for input(s): CKTOTAL, CKMB, CKMBINDEX, TROPONINI in the last 168 hours. BNP (last 3 results)  Recent Labs  10/12/14 0100  BNP 143.8*    ProBNP (last 3 results) No results for input(s): PROBNP in the last 8760 hours.  CBG:  Recent Labs Lab 12/24/14 2155 12/25/14 1204 12/25/14 1718 12/25/14 2111 12/26/14 0746  GLUCAP 89 143* 138* 98 142*    Recent Results (from the past 240 hour(s))  Urine culture     Status: None   Collection Time: 12/23/14  9:41 AM  Result Value Ref Range Status   Specimen Description URINE, CATHETERIZED  Final   Special Requests Normal  Final   Culture >=100,000  COLONIES/mL KLEBSIELLA PNEUMONIAE  Final   Report Status 12/25/2014 FINAL  Final   Organism ID, Bacteria KLEBSIELLA PNEUMONIAE  Final      Susceptibility   Klebsiella pneumoniae - MIC*    AMPICILLIN >=32 RESISTANT Resistant     CEFAZOLIN <=4 SENSITIVE Sensitive     CEFTRIAXONE <=1 SENSITIVE Sensitive     CIPROFLOXACIN <=0.25 SENSITIVE Sensitive     GENTAMICIN <=1 SENSITIVE Sensitive     IMIPENEM <=0.25 SENSITIVE Sensitive     NITROFURANTOIN 64 INTERMEDIATE Intermediate     TRIMETH/SULFA <=20 SENSITIVE Sensitive     AMPICILLIN/SULBACTAM 8 SENSITIVE Sensitive     PIP/TAZO <=4 SENSITIVE Sensitive     * >=100,000 COLONIES/mL KLEBSIELLA PNEUMONIAE  Culture, blood (routine x 2)     Status: None   Collection Time: 12/23/14 10:05 AM  Result Value Ref Range Status   Specimen Description BLOOD LEFT ANTECUBITAL  Final   Special Requests BOTTLES DRAWN AEROBIC AND ANAEROBIC 5CC  Final   Culture  Setup Time   Final    GRAM POSITIVE COCCI IN CLUSTERS IN BOTH AEROBIC AND ANAEROBIC BOTTLES CRITICAL RESULT CALLED TO, READ BACK BY AND VERIFIED WITH: M MURPHY@0057  12/24/14 MKELLY    Culture ENTEROCOCCUS SPECIES  Final   Report Status 12/25/2014 FINAL  Final   Organism ID, Bacteria ENTEROCOCCUS SPECIES  Final      Susceptibility   Enterococcus species - MIC*    AMPICILLIN <=2 SENSITIVE Sensitive     VANCOMYCIN 2 SENSITIVE Sensitive     GENTAMICIN SYNERGY SENSITIVE Sensitive     LINEZOLID 2 SENSITIVE Sensitive     * ENTEROCOCCUS SPECIES  Culture, blood (routine x 2)     Status: None   Collection Time: 12/23/14 10:55 AM  Result Value Ref Range Status   Specimen Description BLOOD LEFT HAND  Final   Special Requests BOTTLES DRAWN AEROBIC ONLY 3CC  Final   Culture  Setup Time   Final    GRAM POSITIVE COCCI IN CLUSTERS AEROBIC BOTTLE ONLY CRITICAL RESULT CALLED TO, READ BACK BY AND VERIFIED WITH: T ISAACS@0316  12/24/14 MKELLY    Culture   Final    ENTEROCOCCUS SPECIES SUSCEPTIBILITIES PERFORMED ON  PREVIOUS CULTURE WITHIN THE LAST 5 DAYS.    Report Status 12/25/2014 FINAL  Final  Culture, blood (routine x 2)     Status: None (Preliminary result)   Collection Time: 12/24/14  3:03 PM  Result Value Ref Range Status   Specimen Description BLOOD LEFT HAND  Final   Special Requests BOTTLES DRAWN AEROBIC AND ANAEROBIC 5CC EA  Final   Culture NO GROWTH < 24 HOURS  Final   Report  Status PENDING  Incomplete  Culture, blood (routine x 2)     Status: None (Preliminary result)   Collection Time: 12/24/14  3:25 PM  Result Value Ref Range Status   Specimen Description BLOOD LEFT WRIST  Final   Special Requests BOTTLES DRAWN AEROBIC AND ANAEROBIC 5CC  Final   Culture NO GROWTH < 24 HOURS  Final   Report Status PENDING  Incomplete     Studies: Ir Removal Tun Cv Cath W/o Fl  12/25/2014   INDICATION: 74 year old female with a history of renal failure and a right-sided IJ central catheter placed October 21, 2014. She presents for removal given bacteremia. She will undergo a line holiday with placement of new catheter next week.  EXAM: TUNNELED CENTRAL VENOUS HEMODIALYSIS CATHETER REMOVAL  MEDICATIONS: NONE  CONTRAST:  None  ANESTHESIA/SEDATION: None  FLUOROSCOPY TIME:  None  COMPLICATIONS: None  PROCEDURE: Informed written consent was obtained from the patient after a discussion of the risks, benefits, and alternatives to treatment. Questions regarding the procedure were encouraged and answered. The right neck and chest, including the indwelling line were prepped with chlorhexidine in a sterile fashion, and a sterile drape was applied covering the operative field. Maximum barrier sterile technique with sterile gowns and gloves were used for the procedure. A timeout was performed prior to the initiation of the procedure.  1% lidocaine was used for local anesthesia.  Using blunt and sharp dissection, the tunneled catheter was removed in its entirety. A sterile dressing was placed.  The patient tolerated the  procedure well and remained hemodynamically stable throughout.  No complications encountered and no significant blood loss encountered.  IMPRESSION: Status post bedside removal of tunneled HD catheter for a line holiday.  Signed,  Dulcy Fanny. Earleen Newport, DO  [facility]  [facility]   Electronically Signed   By: Corrie Mckusick D.O.   On: 12/25/2014 14:09    Scheduled Meds: . ampicillin-sulbactam (UNASYN) IV  3 g Intravenous Q24H  . calcitRIOL  0.75 mcg Oral Q T,Th,Sa-HD  . carvedilol  6.25 mg Oral BID WC  . heparin  5,000 Units Subcutaneous 3 times per day  . insulin aspart  0-9 Units Subcutaneous TID WC  . insulin detemir  70 Units Subcutaneous Q24H  . sodium chloride  3 mL Intravenous Q12H   Continuous Infusions:  Antibiotics Given (last 72 hours)    Date/Time Action Medication Dose Rate   12/23/14 1634 Given   vancomycin (VANCOCIN) 2,000 mg in sodium chloride 0.9 % 250 mL IVPB 2,000 mg 250 mL/hr   12/23/14 2055 Given   cefTRIAXone (ROCEPHIN) 1 g in dextrose 5 % 50 mL IVPB 1 g 100 mL/hr   12/24/14 2240 Given   cefTRIAXone (ROCEPHIN) 2 g in dextrose 5 % 50 mL IVPB 2 g 100 mL/hr   12/25/14 1016 Given  [during HD]   vancomycin (VANCOCIN) IVPB 1000 mg/200 mL premix 1,000 mg 200 mL/hr   12/25/14 1713 Given   Ampicillin-Sulbactam (UNASYN) 3 g in sodium chloride 0.9 % 100 mL IVPB 3 g 100 mL/hr      Principal Problem:   Acute encephalopathy Active Problems:   Breast cancer   Hypertension   COPD (chronic obstructive pulmonary disease)   Essential hypertension   ESRD (end stage renal disease) on dialysis   Type 2 diabetes mellitus with diabetic nephropathy   Nonischemic cardiomyopathy   Morbidly obese   OSA (obstructive sleep apnea)   Constipation   UTI (lower urinary tract infection)  Systolic CHF, chronic   Encephalopathy, metabolic    Time spent: 99991111    Lenus Trauger  [facility] Pager [phone]. If 7PM-7AM, please  contact night-coverage at [email or web address], password [facility] 12/26/2014, 9:47 AM  LOS: 3 days

## 2014-12-26 NOTE — Care Management Note (Signed)
Case Management Note  Patient Details  Name: Jordan [name] MRN: [id number] Date of Birth: 02-08-1941  Subjective/Objective:                  presents to the ED with confusion and slurred speech, and fever r/o sepsis. suspect right chest HD catheter source of  Infection. Patient is on HD T/TH/ Sat  Action/Plan: Referral for  [place] [facility]   Expected Discharge Date:                  Expected Discharge Plan:  [place]  In-House Referral:     Discharge planning Services  CM Consult  Post Acute Care Choice:  Resumption of Svcs/PTA Provider Choice offered to:     DME Arranged:    DME Agency:  [place]  HH Arranged:  RN, PT, OT [facility] Agency:     Status of Service:  In process, will continue to follow  Medicare Important Message Given:    Date Medicare IM Given:    Medicare IM give by:    Date Additional Medicare IM Given:    Additional Medicare Important Message give by:     If discussed at [place] of Stay Meetings, dates discussed:    Additional Comments: Met with patient at bedside regarding discharge planning. Patient states, she lives with her Jordan [name]  ([place] [phone]), and her daughter Jordan [name] [phone]) checks in on her daily. Patient is active with [place] RN/ PT/OT will need resumption of care orders prior to discharge. Patient states, she had an AVF placed in July but not matured as of yet, Patient will need HD access prior to discharge. CM will continue to follow final discharge plan.  [place] Laurena Slimmer, RN 12/26/2014, 5:46 PM

## 2014-12-26 NOTE — Progress Notes (Signed)
Utilization Review Completed.Jordan [name] T9/02/2015  

## 2014-12-26 NOTE — Progress Notes (Addendum)
INFECTIOUS DISEASE PROGRESS NOTE  ID: Jordan [name] is a 74 y.o. female with  Principal Problem:   Acute encephalopathy Active Problems:   Breast cancer   Hypertension   COPD (chronic obstructive pulmonary disease)   Essential hypertension   ESRD (end stage renal disease) on dialysis   Type 2 diabetes mellitus with diabetic nephropathy   Nonischemic cardiomyopathy   Morbidly obese   OSA (obstructive sleep apnea)   Constipation   UTI (lower urinary tract infection)   Systolic CHF, chronic   Encephalopathy, metabolic   Enterococcal bacteremia  Subjective: C/o nausea  Abtx:  Anti-infectives    Start     Dose/Rate Route Frequency Ordered Stop   12/25/14 1800  cefTRIAXone (ROCEPHIN) 2 g in dextrose 5 % 50 mL IVPB  Status:  Discontinued     2 g 100 mL/hr over 30 Minutes Intravenous Every T-Th-Sa (1800) 12/24/14 1351 12/24/14 1420   12/25/14 1600  Ampicillin-Sulbactam (UNASYN) 3 g in sodium chloride 0.9 % 100 mL IVPB     3 g 100 mL/hr over 60 Minutes Intravenous Every 24 hours 12/25/14 1451     12/25/14 1200  vancomycin (VANCOCIN) IVPB 1000 mg/200 mL premix  Status:  Discontinued     1,000 mg 200 mL/hr over 60 Minutes Intravenous Every T-Th-Sa (Hemodialysis) 12/23/14 1540 12/25/14 1451   12/24/14 2000  cefTRIAXone (ROCEPHIN) 2 g in dextrose 5 % 50 mL IVPB  Status:  Discontinued     2 g 100 mL/hr over 30 Minutes Intravenous Every 24 hours 12/24/14 1420 12/25/14 1451   12/23/14 1900  cefTRIAXone (ROCEPHIN) 1 g in dextrose 5 % 50 mL IVPB  Status:  Discontinued     1 g 100 mL/hr over 30 Minutes Intravenous Every 24 hours 12/23/14 1858 12/24/14 1420   12/23/14 1600  vancomycin (VANCOCIN) 2,000 mg in sodium chloride 0.9 % 250 mL IVPB     2,000 mg 250 mL/hr over 60 Minutes Intravenous  Once 12/23/14 1540 12/23/14 1734   12/23/14 1000  vancomycin (VANCOCIN) IVPB 1000 mg/200 mL premix     1,000 mg 200 mL/hr over 60 Minutes Intravenous  Once 12/23/14 0945 12/23/14 1129   12/23/14 1000  piperacillin-tazobactam (ZOSYN) IVPB 3.375 g     3.375 g 12.5 mL/hr over 240 Minutes Intravenous  Once 12/23/14 0945 12/23/14 1404      Medications:  Scheduled: . ampicillin-sulbactam (UNASYN) IV  3 g Intravenous Q24H  . calcitRIOL  0.75 mcg Oral Q T,Th,Sa-HD  . carvedilol  6.25 mg Oral BID WC  . heparin  5,000 Units Subcutaneous 3 times per day  . insulin aspart  0-9 Units Subcutaneous TID WC  . insulin detemir  70 Units Subcutaneous Q24H  . sodium chloride  3 mL Intravenous Q12H    Objective: Vital signs in last 24 hours: Temp:  [97.6 F (36.4 C)-98.5 F (36.9 C)] 98.2 F (36.8 C) (09/11 0750) Pulse Rate:  [57-65] 65 (09/11 0750) Resp:  [18-19] 18 (09/11 0750) BP: (109-115)/(35-46) 115/35 mmHg (09/11 0750) SpO2:  [97 %-98 %] 97 % (09/11 0750) Weight:  [121.6 kg (268 lb 1.3 oz)] 121.6 kg (268 lb 1.3 oz) (09/10 2117)   General appearance: alert, cooperative and no distress Resp: clear to auscultation bilaterally Chest wall: no tenderness, wound site clean, no cordis or fluctuance.  Cardio: regular rate and rhythm GI: normal findings: bowel sounds normal and soft, non-tender  Lab Results  Recent Labs  12/25/14 0709 12/26/14 0608  WBC 10.9* 12.6*  HGB  9.7* 10.0*  HCT 31.7* 34.6*  NA 134* 136  K 3.6 3.8  CL 97* 98*  CO2 26 26  BUN 39* 24*  CREATININE 7.79* 6.43*   Liver Panel  Recent Labs  12/24/14 0710 12/25/14 0709  ALBUMIN 2.8* 2.6*   Sedimentation Rate No results for input(s): ESRSEDRATE in the last 72 hours. C-Reactive Protein No results for input(s): CRP in the last 72 hours.  Microbiology: Recent Results (from the past 240 hour(s))  Urine culture     Status: None   Collection Time: 12/23/14  9:41 AM  Result Value Ref Range Status   Specimen Description URINE, CATHETERIZED  Final   Special Requests Normal  Final   Culture >=100,000 COLONIES/mL KLEBSIELLA PNEUMONIAE  Final   Report Status 12/25/2014 FINAL  Final   Organism ID,  Bacteria KLEBSIELLA PNEUMONIAE  Final      Susceptibility   Klebsiella pneumoniae - MIC*    AMPICILLIN >=32 RESISTANT Resistant     CEFAZOLIN <=4 SENSITIVE Sensitive     CEFTRIAXONE <=1 SENSITIVE Sensitive     CIPROFLOXACIN <=0.25 SENSITIVE Sensitive     GENTAMICIN <=1 SENSITIVE Sensitive     IMIPENEM <=0.25 SENSITIVE Sensitive     NITROFURANTOIN 64 INTERMEDIATE Intermediate     TRIMETH/SULFA <=20 SENSITIVE Sensitive     AMPICILLIN/SULBACTAM 8 SENSITIVE Sensitive     PIP/TAZO <=4 SENSITIVE Sensitive     * >=100,000 COLONIES/mL KLEBSIELLA PNEUMONIAE  Culture, blood (routine x 2)     Status: None   Collection Time: 12/23/14 10:05 AM  Result Value Ref Range Status   Specimen Description BLOOD LEFT ANTECUBITAL  Final   Special Requests BOTTLES DRAWN AEROBIC AND ANAEROBIC 5CC  Final   Culture  Setup Time   Final    GRAM POSITIVE COCCI IN CLUSTERS IN BOTH AEROBIC AND ANAEROBIC BOTTLES CRITICAL RESULT CALLED TO, READ BACK BY AND VERIFIED WITH: M MURPHY@0057  12/24/14 MKELLY    Culture ENTEROCOCCUS SPECIES  Final   Report Status 12/25/2014 FINAL  Final   Organism ID, Bacteria ENTEROCOCCUS SPECIES  Final      Susceptibility   Enterococcus species - MIC*    AMPICILLIN <=2 SENSITIVE Sensitive     VANCOMYCIN 2 SENSITIVE Sensitive     GENTAMICIN SYNERGY SENSITIVE Sensitive     LINEZOLID 2 SENSITIVE Sensitive     * ENTEROCOCCUS SPECIES  Culture, blood (routine x 2)     Status: None   Collection Time: 12/23/14 10:55 AM  Result Value Ref Range Status   Specimen Description BLOOD LEFT HAND  Final   Special Requests BOTTLES DRAWN AEROBIC ONLY 3CC  Final   Culture  Setup Time   Final    GRAM POSITIVE COCCI IN CLUSTERS AEROBIC BOTTLE ONLY CRITICAL RESULT CALLED TO, READ BACK BY AND VERIFIED WITH: T ISAACS@0316  12/24/14 MKELLY    Culture   Final    ENTEROCOCCUS SPECIES SUSCEPTIBILITIES PERFORMED ON PREVIOUS CULTURE WITHIN THE LAST 5 DAYS.    Report Status 12/25/2014 FINAL  Final  Culture,  blood (routine x 2)     Status: None (Preliminary result)   Collection Time: 12/24/14  3:03 PM  Result Value Ref Range Status   Specimen Description BLOOD LEFT HAND  Final   Special Requests BOTTLES DRAWN AEROBIC AND ANAEROBIC 5CC EA  Final   Culture NO GROWTH < 24 HOURS  Final   Report Status PENDING  Incomplete  Culture, blood (routine x 2)     Status: None (Preliminary result)   Collection Time:  12/24/14  3:25 PM  Result Value Ref Range Status   Specimen Description BLOOD LEFT WRIST  Final   Special Requests BOTTLES DRAWN AEROBIC AND ANAEROBIC 5CC  Final   Culture NO GROWTH < 24 HOURS  Final   Report Status PENDING  Incomplete    Studies/Results: Ir Removal Tun Cv Cath W/o Fl  12/25/2014   INDICATION: 74 year old female with a history of renal failure and a right-sided IJ central catheter placed October 21, 2014. She presents for removal given bacteremia. She will undergo a line holiday with placement of new catheter next week.  EXAM: TUNNELED CENTRAL VENOUS HEMODIALYSIS CATHETER REMOVAL  MEDICATIONS: NONE  CONTRAST:  None  ANESTHESIA/SEDATION: None  FLUOROSCOPY TIME:  None  COMPLICATIONS: None  PROCEDURE: Informed written consent was obtained from the patient after a discussion of the risks, benefits, and alternatives to treatment. Questions regarding the procedure were encouraged and answered. The right neck and chest, including the indwelling line were prepped with chlorhexidine in a sterile fashion, and a sterile drape was applied covering the operative field. Maximum barrier sterile technique with sterile gowns and gloves were used for the procedure. A timeout was performed prior to the initiation of the procedure.  1% lidocaine was used for local anesthesia.  Using blunt and sharp dissection, the tunneled catheter was removed in its entirety. A sterile dressing was placed.  The patient tolerated the procedure well and remained hemodynamically stable throughout.  No complications encountered  and no significant blood loss encountered.  IMPRESSION: Status post bedside removal of tunneled HD catheter for a line holiday.  Signed,  Dulcy Fanny. Earleen Newport, DO  [facility]  [facility]   Electronically Signed   By: Corrie Mckusick D.O.   On: 12/25/2014 14:09     Assessment/Plan: Enterococcal Bacteremia (pan sens) Changed her to unasyn This will cover her enterococcus and her klebsiella Await repeat BCx. So far ngtd Check TEE- she has a very noticeable murmur. CV eval in AM  Line related bacteremia UTI (kleb pneumonia R-amp, I- nitro) will change her anbx to unasyn  ESRD DM2 with nephropathy Continue to follow FSG Have been mildly up. Aim for better control  Nausea  will defer to primary team.   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

## 2014-12-26 NOTE — Progress Notes (Signed)
Subjective:   A little nausea after eating. Had a good night. Cath out yesterday post HD  Objective Filed Vitals:   12/25/14 1116 12/25/14 2117 12/26/14 0513 12/26/14 0750  BP: 101/47 109/46 112/37 115/35  Pulse: 60 57 62 65  Temp: 97.9 F (36.6 C) 98.5 F (36.9 C) 97.6 F (36.4 C) 98.2 F (36.8 C)  TempSrc: Oral Oral Oral Oral  Resp: 21 18 19 18   Height:      Weight: 121 kg (266 lb 12.1 oz) 121.6 kg (268 lb 1.3 oz)    SpO2: 100% 98% 98% 97%   Physical Exam General: alert and oriented. No acute distress. sitting up in chair Heart: RRR  Lungs: CTA/dim, unlabored Abdomen: morbidly obese. Soft, nontender +BS  Extremities: trace LE edema  Dialysis Access: R avf +b/t  Dialysis Orders: [facility] 4.25 180 400/800 EDW 123 3 K 2.5 Ca profile 2 heparin 13,000 venofer 100 through 9/20 Mircer 100 q 2 weeks last given 9/6 calcitriol 0.75 Recent: Hgb 10.8 9/1 13% sat iPH 327 have been using 1 needle in AVF The past 3 treatments at Qb 250  Assessment/Plan: 1. AMS/febrile illness - feels better. afebrile Blood cultures Gram + cocci/enterococcus. Klebsiella UTI. Vanc and Rocephin Dcd changed to Unasyn. HD cath likely source of bacteremia- cath removed yesterday post HD. ID following. Recommend TEE 2. ESRD - TTS - AVF did not run well w 2 needles still needs cath for HD- to be replaced next week.. K+3.6- use 4K bath - next HD tuesday 3. Hypertension/volume - BP here variable but overall controlled. Given morbid obesity, left lymphedema, it is difficult to assess volume status; on coreg 6.25 bid at home - under edw. 4. Anemia - Hgb 10- watch CBC - just dosed 9/6 5. Metabolic bone disease - phos 4.9 Continue calcitriol; not on binder or sensipar 6. Nutrition - morbidly obese. EDW lowered some since starting HD, but most decrease likely to volume removal. Renal diet. alb 2.6 7. Hx breast cancer s/p left mastectomy - chronic left arm lymphedema 8. NICM EF 30%, recent NSTEMI low dose  BB 9. DM 10. HX OSA - referred to pul 8/24 by PCP- has used CPAP in the past   Shelle Iron, NP [place] [phone] 12/26/2014,8:59 AM  LOS: 3 days   Pt seen, examined and agree w A/P as above. Will get new [facility] Mon or Tues per IR.  Kelly Splinter MD pager [phone]    cell [phone] 12/26/2014, 11:49 AM    Additional Objective Labs: Basic Metabolic Panel:  Recent Labs Lab 12/24/14 0710 12/25/14 0552 12/25/14 0709 12/26/14 0608  NA 136 135 134* 136  K 3.7 3.6 3.6 3.8  CL 98* 97* 97* 98*  CO2 28 26 26 26   GLUCOSE 164* 91 98 102*  BUN 30* 38* 39* 24*  CREATININE 6.05* 7.75* 7.79* 6.43*  CALCIUM 8.6* 8.5* 8.6* 8.8*  PHOS 4.1  --  4.9*  --    Liver Function Tests:  Recent Labs Lab 12/24/14 0710 12/25/14 0709  ALBUMIN 2.8* 2.6*   No results for input(s): LIPASE, AMYLASE in the last 168 hours. CBC:  Recent Labs Lab 12/23/14 0900 12/24/14 0710 12/25/14 0552 12/25/14 0709 12/26/14 0608  WBC 14.1* 10.8* 10.7* 10.9* 12.6*  HGB 11.0* 9.9* 9.2* 9.7* 10.0*  HCT 36.0 32.8* 31.1* 31.7* 34.6*  MCV 82.4 82.4 83.2 82.6 83.8  PLT 232 245 256 245 280   Blood Culture    Component Value Date/Time  SDES BLOOD LEFT WRIST 12/24/2014 1525   SPECREQUEST BOTTLES DRAWN AEROBIC AND ANAEROBIC 5CC 12/24/2014 1525   CULT NO GROWTH < 24 HOURS 12/24/2014 1525   REPTSTATUS PENDING 12/24/2014 1525    Cardiac Enzymes: No results for input(s): CKTOTAL, CKMB, CKMBINDEX, TROPONINI in the last 168 hours. CBG:  Recent Labs Lab 12/24/14 2155 12/25/14 1204 12/25/14 1718 12/25/14 2111 12/26/14 0746  GLUCAP 89 143* 138* 98 142*   Iron Studies: No results for input(s): IRON, TIBC, TRANSFERRIN, FERRITIN in the last 72 hours. @lablastinr3 @ Studies/Results: Ir Removal Tun Cv Cath W/o Fl  12/25/2014   INDICATION: 74 year old female with a history of renal failure and a right-sided IJ central catheter placed October 21, 2014. She presents for removal given  bacteremia. She will undergo a line holiday with placement of new catheter next week.  EXAM: TUNNELED CENTRAL VENOUS HEMODIALYSIS CATHETER REMOVAL  MEDICATIONS: NONE  CONTRAST:  None  ANESTHESIA/SEDATION: None  FLUOROSCOPY TIME:  None  COMPLICATIONS: None  PROCEDURE: Informed written consent was obtained from the patient after a discussion of the risks, benefits, and alternatives to treatment. Questions regarding the procedure were encouraged and answered. The right neck and chest, including the indwelling line were prepped with chlorhexidine in a sterile fashion, and a sterile drape was applied covering the operative field. Maximum barrier sterile technique with sterile gowns and gloves were used for the procedure. A timeout was performed prior to the initiation of the procedure.  1% lidocaine was used for local anesthesia.  Using blunt and sharp dissection, the tunneled catheter was removed in its entirety. A sterile dressing was placed.  The patient tolerated the procedure well and remained hemodynamically stable throughout.  No complications encountered and no significant blood loss encountered.  IMPRESSION: Status post bedside removal of tunneled HD catheter for a line holiday.  Signed,  Dulcy Fanny. Earleen Newport, DO  [facility]  [facility]   Electronically Signed   By: Corrie Mckusick D.O.   On: 12/25/2014 14:09   Medications:   . ampicillin-sulbactam (UNASYN) IV  3 g Intravenous Q24H  . calcitRIOL  0.75 mcg Oral Q T,Th,Sa-HD  . carvedilol  6.25 mg Oral BID WC  . heparin  5,000 Units Subcutaneous 3 times per day  . insulin aspart  0-9 Units Subcutaneous TID WC  . insulin detemir  70 Units Subcutaneous Q24H  . sodium chloride  3 mL Intravenous Q12H

## 2014-12-27 ENCOUNTER — Ambulatory Visit: Payer: Medicare (Managed Care) | Admitting: Occupational Therapy

## 2014-12-27 ENCOUNTER — Inpatient Hospital Stay (HOSPITAL_COMMUNITY): Payer: Medicare (Managed Care)

## 2014-12-27 ENCOUNTER — Encounter (HOSPITAL_COMMUNITY): Payer: Self-pay

## 2014-12-27 ENCOUNTER — Encounter (HOSPITAL_COMMUNITY)
Admission: EM | Disposition: A | Discharge status: Home Health | Payer: Self-pay | Source: Home / Self Care | Attending: Internal Medicine

## 2014-12-27 DIAGNOSIS — R7881 Bacteremia: Secondary | ICD-10-CM

## 2014-12-27 DIAGNOSIS — R509 Fever, unspecified: Secondary | ICD-10-CM

## 2014-12-27 HISTORY — PX: TEE WITHOUT CARDIOVERSION: SHX5443

## 2014-12-27 LAB — GLUCOSE, CAPILLARY
GLUCOSE-CAPILLARY: 173 mg/dL — AB (ref 65–99)
Glucose-Capillary: 145 mg/dL — ABNORMAL HIGH (ref 65–99)
Glucose-Capillary: 173 mg/dL — ABNORMAL HIGH (ref 65–99)
Glucose-Capillary: 160 mg/dL — ABNORMAL HIGH (ref 65–99)

## 2014-12-27 LAB — PROTIME-INR
INR: 1.13 (ref 0.00–1.49)
PROTHROMBIN TIME: 14.7 s (ref 11.6–15.2)

## 2014-12-27 LAB — MRSA PCR SCREENING: MRSA by PCR: NEGATIVE

## 2014-12-27 SURGERY — ECHOCARDIOGRAM, TRANSESOPHAGEAL
Anesthesia: Moderate Sedation

## 2014-12-27 MED ORDER — MIDAZOLAM HCL 10 MG/2ML IJ SOLN
INTRAMUSCULAR | Status: DC | PRN
  Administered 2014-12-27 (×2): 2 mg via INTRAVENOUS

## 2014-12-27 MED ORDER — DEXTROSE 5 % IV SOLN
3.0000 g | INTRAVENOUS | Status: AC
  Administered 2014-12-28: 3 g via INTRAVENOUS
  Filled 2014-12-27: qty 3000

## 2014-12-27 MED ORDER — HEPARIN SODIUM (PORCINE) 5000 UNIT/ML IJ SOLN
5000.0000 [IU] | Freq: Three times a day (TID) | INTRAMUSCULAR | Status: DC
  Administered 2014-12-27 – 2014-12-29 (×4): 5000 [IU] via SUBCUTANEOUS
  Filled 2014-12-27 (×5): qty 1

## 2014-12-27 MED ORDER — GABAPENTIN 300 MG PO CAPS
300.0000 mg | ORAL_CAPSULE | Freq: Every day | ORAL | Status: DC
  Administered 2014-12-27 – 2014-12-29 (×2): 300 mg via ORAL
  Filled 2014-12-27 (×2): qty 1

## 2014-12-27 MED ORDER — FENTANYL CITRATE (PF) 100 MCG/2ML IJ SOLN
INTRAMUSCULAR | Status: DC | PRN
  Administered 2014-12-27: 50 ug via INTRAVENOUS
  Administered 2014-12-27: 25 ug via INTRAVENOUS

## 2014-12-27 MED ORDER — DIPHENHYDRAMINE HCL 50 MG/ML IJ SOLN
INTRAMUSCULAR | Status: AC
  Filled 2014-12-27: qty 1

## 2014-12-27 MED ORDER — DEXTROSE 5 % IV SOLN
3.0000 g | INTRAVENOUS | Status: DC
  Filled 2014-12-27: qty 3000

## 2014-12-27 MED ORDER — BUTAMBEN-TETRACAINE-BENZOCAINE 2-2-14 % EX AERO
INHALATION_SPRAY | CUTANEOUS | Status: DC | PRN
  Administered 2014-12-27: 2 via TOPICAL
  Administered 2014-12-27: 1 via TOPICAL

## 2014-12-27 MED ORDER — SODIUM CHLORIDE 0.9 % IV SOLN: INTRAVENOUS | Status: DC

## 2014-12-27 MED ORDER — MIDAZOLAM HCL 5 MG/ML IJ SOLN
INTRAMUSCULAR | Status: AC
Start: 2014-12-27 — End: 2014-12-27
  Filled 2014-12-27: qty 2

## 2014-12-27 MED ORDER — FENTANYL CITRATE (PF) 100 MCG/2ML IJ SOLN
INTRAMUSCULAR | Status: AC
  Filled 2014-12-27: qty 2

## 2014-12-27 NOTE — H&P (View-Only) (Signed)
[facility] PROGRESS NOTE  Anisa Mcloud Reed-Grier [id number] DOB: January 26, 1941 DOA: 12/23/2014 PCP: [name], MD  Assessment/Plan: 1. Enterococcal  Sepsis  -improving -Blood cultures with Enterococcus suspect right chest HD catheter is the source -Vanc changed to IV Unasyn 9/10, ID consult appreciated -HD catheter removed 9/10 -repeat Blood Cx NGTD -d/w Cards, plan for TEE for today -needs new HD access tomorrow  2. Metabolic encephalopathy -Due to sepsis, mentation back to baseline, no focal signs, CT head and unremarkable  3. ESRD - -has an AV fistula in the right arm which was placed in July, not mature yet,  HD catheter removed 9/10 -Will need new dialysis catheter placed probably tomorrow -Per Renal  4. Hypertension/volume -  -Stable, on coreg   5. Anemia of chronic disease and iron deficiency -Stable, continue Aranesp  6. History of breast cancer status post left mastectomy and chronic left arm lymphedema  7. History of chronic systolic CHF/NICM, EF [id number],  -volume managed with HD -Continue Coreg  8. Diabetes mellitus -Stable continue Levemir, sliding scale insulin will follow HbA1c  9. OSA - referred to pul 8/24 by PCP- has used CPAP in the past  10. Klebsiella UTI  -Now on Unasyn to cover both urine and Enterococcus in blood  Morbid obesity  DVT prophylaxis : Heparin subcutaneous    Code Status: Full Code Family Communication: none at bedside Disposition Plan: Home when improved   Consultants:  Renal  HPI/Subjective: Upset abt being NPO  Objective: Filed Vitals:   12/27/14 0700  BP: 122/45  Pulse: 70  Temp: 98.3 F (36.8 C)  Resp: 17    Intake/Output Summary (Last 24 hours) at 12/27/14 1408 Last data filed at 12/27/14 0928  Gross per 24 hour  Intake    510 ml  Output      0 ml  Net    510 ml   Filed Weights   12/25/14 2117 12/26/14 2116 12/27/14 0457  Weight: 121.6 kg (268 lb 1.3 oz) 124.9 kg (275 lb 5.7 oz) 125.6 kg  (276 lb 14.4 oz)    Exam:   General:  AAOx3, obese, pleasant  Cardiovascular: S1S2/RRR  HEENT: dressing at site of prior HD cath  Respiratory: CTAB  Abdomen: soft, NT, BSpresent  Musculoskeletal: no edema c/c   Data Reviewed: Basic Metabolic Panel:  Recent Labs Lab 12/23/14 0900 12/23/14 0914 12/24/14 0710 12/25/14 0552 12/25/14 0709 12/26/14 0608  NA 134*  --  136 135 134* 136  K 4.7  --  3.7 3.6 3.6 3.8  CL 97*  --  98* 97* 97* 98*  CO2 24  --  28 26 26 26   GLUCOSE 235*  --  164* 91 98 102*  BUN 62*  --  30* 38* 39* 24*  CREATININE 9.26* 9.10* 6.05* 7.75* 7.79* 6.43*  CALCIUM 8.9  --  8.6* 8.5* 8.6* 8.8*  MG 2.0  --   --   --   --   --   PHOS  --   --  4.1  --  4.9*  --    Liver Function Tests:  Recent Labs Lab 12/24/14 0710 12/25/14 0709  ALBUMIN 2.8* 2.6*   No results for input(s): LIPASE, AMYLASE in the last 168 hours. No results for input(s): AMMONIA in the last 168 hours. CBC:  Recent Labs Lab 12/23/14 0900 12/24/14 0710 12/25/14 0552 12/25/14 0709 12/26/14 0608  WBC 14.1* 10.8* 10.7* 10.9* 12.6*  HGB 11.0* 9.9* 9.2* 9.7* 10.0*  HCT 36.0 32.8* 31.1* 31.7*  34.6*  MCV 82.4 82.4 83.2 82.6 83.8  PLT 232 245 256 245 280   Cardiac Enzymes: No results for input(s): CKTOTAL, CKMB, CKMBINDEX, TROPONINI in the last 168 hours. BNP (last 3 results)  Recent Labs  10/12/14 0100  BNP 143.8*    ProBNP (last 3 results) No results for input(s): PROBNP in the last 8760 hours.  CBG:  Recent Labs Lab 12/26/14 1150 12/26/14 1624 12/26/14 2113 12/27/14 0754 12/27/14 1114  GLUCAP 151* 139* 136* 145* 173*    Recent Results (from the past 240 hour(s))  Urine culture     Status: None   Collection Time: 12/23/14  9:41 AM  Result Value Ref Range Status   Specimen Description URINE, CATHETERIZED  Final   Special Requests Normal  Final   Culture >=100,000 COLONIES/mL KLEBSIELLA PNEUMONIAE  Final   Report Status 12/25/2014 FINAL  Final    Organism ID, Bacteria KLEBSIELLA PNEUMONIAE  Final      Susceptibility   Klebsiella pneumoniae - MIC*    AMPICILLIN >=32 RESISTANT Resistant     CEFAZOLIN <=4 SENSITIVE Sensitive     CEFTRIAXONE <=1 SENSITIVE Sensitive     CIPROFLOXACIN <=0.25 SENSITIVE Sensitive     GENTAMICIN <=1 SENSITIVE Sensitive     IMIPENEM <=0.25 SENSITIVE Sensitive     NITROFURANTOIN 64 INTERMEDIATE Intermediate     TRIMETH/SULFA <=20 SENSITIVE Sensitive     AMPICILLIN/SULBACTAM 8 SENSITIVE Sensitive     PIP/TAZO <=4 SENSITIVE Sensitive     * >=100,000 COLONIES/mL KLEBSIELLA PNEUMONIAE  Culture, blood (routine x 2)     Status: None   Collection Time: 12/23/14 10:05 AM  Result Value Ref Range Status   Specimen Description BLOOD LEFT ANTECUBITAL  Final   Special Requests BOTTLES DRAWN AEROBIC AND ANAEROBIC 5CC  Final   Culture  Setup Time   Final    GRAM POSITIVE COCCI IN CLUSTERS IN BOTH AEROBIC AND ANAEROBIC BOTTLES CRITICAL RESULT CALLED TO, READ BACK BY AND VERIFIED WITH: M MURPHY@0057  12/24/14 MKELLY    Culture ENTEROCOCCUS SPECIES  Final   Report Status 12/25/2014 FINAL  Final   Organism ID, Bacteria ENTEROCOCCUS SPECIES  Final      Susceptibility   Enterococcus species - MIC*    AMPICILLIN <=2 SENSITIVE Sensitive     VANCOMYCIN 2 SENSITIVE Sensitive     GENTAMICIN SYNERGY SENSITIVE Sensitive     LINEZOLID 2 SENSITIVE Sensitive     * ENTEROCOCCUS SPECIES  Culture, blood (routine x 2)     Status: None   Collection Time: 12/23/14 10:55 AM  Result Value Ref Range Status   Specimen Description BLOOD LEFT HAND  Final   Special Requests BOTTLES DRAWN AEROBIC ONLY 3CC  Final   Culture  Setup Time   Final    GRAM POSITIVE COCCI IN CLUSTERS AEROBIC BOTTLE ONLY CRITICAL RESULT CALLED TO, READ BACK BY AND VERIFIED WITH: T ISAACS@[id number]  12/24/14 MKELLY    Culture   Final    ENTEROCOCCUS SPECIES SUSCEPTIBILITIES PERFORMED ON PREVIOUS CULTURE WITHIN THE LAST 5 DAYS.    Report Status 12/25/2014 FINAL  Final   Culture, blood (routine x 2)     Status: None (Preliminary result)   Collection Time: 12/24/14  3:03 PM  Result Value Ref Range Status   Specimen Description BLOOD LEFT HAND  Final   Special Requests BOTTLES DRAWN AEROBIC AND ANAEROBIC 5CC  Final   Culture NO GROWTH 3 DAYS  Final   Report Status PENDING  Incomplete  Culture, blood (routine x 2)  Status: None (Preliminary result)   Collection Time: 12/24/14  3:25 PM  Result Value Ref Range Status   Specimen Description BLOOD LEFT WRIST  Final   Special Requests BOTTLES DRAWN AEROBIC AND ANAEROBIC 5CC  Final   Culture NO GROWTH 3 DAYS  Final   Report Status PENDING  Incomplete     Studies: No results found.  Scheduled Meds: . ampicillin-sulbactam (UNASYN) IV  3 g Intravenous Q24H  . calcitRIOL  0.75 mcg Oral Q T,Th,Sa-HD  . carvedilol  6.25 mg Oral BID WC  . gabapentin  300 mg Oral QHS  . heparin  5,000 Units Subcutaneous 3 times per day  . insulin aspart  0-9 Units Subcutaneous TID WC  . insulin detemir  70 Units Subcutaneous Q24H  . sodium chloride  3 mL Intravenous Q12H   Continuous Infusions: . sodium chloride     Antibiotics Given (last 72 hours)    Date/Time Action Medication Dose Rate   12/24/14 2240 Given   cefTRIAXone (ROCEPHIN) 2 g in dextrose 5 % 50 mL IVPB 2 g 100 mL/hr   12/25/14 1016 Given  [during HD]   vancomycin (VANCOCIN) IVPB 1000 mg/200 mL premix 1,000 mg 200 mL/hr   12/25/14 1713 Given   Ampicillin-Sulbactam (UNASYN) 3 g in sodium chloride 0.9 % 100 mL IVPB 3 g 100 mL/hr   12/26/14 1748 Given   Ampicillin-Sulbactam (UNASYN) 3 g in sodium chloride 0.9 % 100 mL IVPB 3 g 100 mL/hr      Principal Problem:   Acute encephalopathy Active Problems:   Breast cancer   Hypertension   COPD (chronic obstructive pulmonary disease)   Essential hypertension   ESRD (end stage renal disease) on dialysis   Type 2 diabetes mellitus with diabetic nephropathy   Nonischemic cardiomyopathy   Morbidly obese    OSA (obstructive sleep apnea)   Constipation   UTI (lower urinary tract infection)   Systolic CHF, chronic   Encephalopathy, metabolic   Enterococcal bacteremia   Bacteremia    Time spent: 34min    Sybol Morre  [facility] Pager [phone]. If 7PM-7AM, please contact night-coverage at [email or web address], password [facility] 12/27/2014, 2:08 PM  LOS: 4 days

## 2014-12-27 NOTE — Progress Notes (Signed)
[facility] PROGRESS NOTE  Jordan [name] [id number] DOB: 05/06/1940 DOA: 12/23/2014 PCP: [name], MD  Assessment/Plan: 1. Enterococcal  Sepsis  -improving -Blood cultures with Enterococcus suspect right chest HD catheter is the source -Vanc changed to IV Unasyn 9/10, ID consult appreciated -HD catheter removed 9/10 -repeat Blood Cx NGTD -d/w Cards, plan for TEE for today -needs new HD access tomorrow  2. Metabolic encephalopathy -Due to sepsis, mentation back to baseline, no focal signs, CT head and unremarkable  3. ESRD - -has an AV fistula in the right arm which was placed in July, not mature yet,  HD catheter removed 9/10 -Will need new dialysis catheter placed probably tomorrow -Per Renal  4. Hypertension/volume -  -Stable, on coreg   5. Anemia of chronic disease and iron deficiency -Stable, continue Aranesp  6. History of breast cancer status post left mastectomy and chronic left arm lymphedema  7. History of chronic systolic CHF/NICM, EF [id number],  -volume managed with HD -Continue Coreg  8. Diabetes mellitus -Stable continue Levemir, sliding scale insulin will follow HbA1c  9. OSA - referred to pul 8/24 by PCP- has used CPAP in the past  10. Klebsiella UTI  -Now on Unasyn to cover both urine and Enterococcus in blood  Morbid obesity  DVT prophylaxis : Heparin subcutaneous    Code Status: Full Code Family Communication: none at bedside Disposition Plan: Home when improved   Consultants:  Renal  HPI/Subjective: Upset abt being NPO  Objective: Filed Vitals:   12/27/14 0700  BP: 122/45  Pulse: 70  Temp: 98.3 F (36.8 C)  Resp: 17    Intake/Output Summary (Last 24 hours) at 12/27/14 1408 Last data filed at 12/27/14 0928  Gross per 24 hour  Intake    510 ml  Output      0 ml  Net    510 ml   Filed Weights   12/25/14 2117 12/26/14 2116 12/27/14 0457  Weight: 121.6 kg (268 lb 1.3 oz) 124.9 kg (275 lb 5.7 oz) 125.6 kg  (276 lb 14.4 oz)    Exam:   General:  AAOx3, obese, pleasant  Cardiovascular: S1S2/RRR  HEENT: dressing at site of prior HD cath  Respiratory: CTAB  Abdomen: soft, NT, BSpresent  Musculoskeletal: no edema c/c   Data Reviewed: Basic Metabolic Panel:  Recent Labs Lab 12/23/14 0900 12/23/14 0914 12/24/14 0710 12/25/14 0552 12/25/14 0709 12/26/14 0608  NA 134*  --  136 135 134* 136  K 4.7  --  3.7 3.6 3.6 3.8  CL 97*  --  98* 97* 97* 98*  CO2 24  --  28 26 26 26   GLUCOSE 235*  --  164* 91 98 102*  BUN 62*  --  30* 38* 39* 24*  CREATININE 9.26* 9.10* 6.05* 7.75* 7.79* 6.43*  CALCIUM 8.9  --  8.6* 8.5* 8.6* 8.8*  MG 2.0  --   --   --   --   --   PHOS  --   --  4.1  --  4.9*  --    Liver Function Tests:  Recent Labs Lab 12/24/14 0710 12/25/14 0709  ALBUMIN 2.8* 2.6*   No results for input(s): LIPASE, AMYLASE in the last 168 hours. No results for input(s): AMMONIA in the last 168 hours. CBC:  Recent Labs Lab 12/23/14 0900 12/24/14 0710 12/25/14 0552 12/25/14 0709 12/26/14 0608  WBC 14.1* 10.8* 10.7* 10.9* 12.6*  HGB 11.0* 9.9* 9.2* 9.7* 10.0*  HCT 36.0 32.8* 31.1* 31.7*  34.6*  MCV 82.4 82.4 83.2 82.6 83.8  PLT 232 245 256 245 280   Cardiac Enzymes: No results for input(s): CKTOTAL, CKMB, CKMBINDEX, TROPONINI in the last 168 hours. BNP (last 3 results)  Recent Labs  10/12/14 0100  BNP 143.8*    ProBNP (last 3 results) No results for input(s): PROBNP in the last 8760 hours.  CBG:  Recent Labs Lab 12/26/14 1150 12/26/14 1624 12/26/14 2113 12/27/14 0754 12/27/14 1114  GLUCAP 151* 139* 136* 145* 173*    Recent Results (from the past 240 hour(s))  Urine culture     Status: None   Collection Time: 12/23/14  9:41 AM  Result Value Ref Range Status   Specimen Description URINE, CATHETERIZED  Final   Special Requests Normal  Final   Culture >=100,000 COLONIES/mL KLEBSIELLA PNEUMONIAE  Final   Report Status 12/25/2014 FINAL  Final    Organism ID, Bacteria KLEBSIELLA PNEUMONIAE  Final      Susceptibility   Klebsiella pneumoniae - MIC*    AMPICILLIN >=32 RESISTANT Resistant     CEFAZOLIN <=4 SENSITIVE Sensitive     CEFTRIAXONE <=1 SENSITIVE Sensitive     CIPROFLOXACIN <=0.25 SENSITIVE Sensitive     GENTAMICIN <=1 SENSITIVE Sensitive     IMIPENEM <=0.25 SENSITIVE Sensitive     NITROFURANTOIN 64 INTERMEDIATE Intermediate     TRIMETH/SULFA <=20 SENSITIVE Sensitive     AMPICILLIN/SULBACTAM 8 SENSITIVE Sensitive     PIP/TAZO <=4 SENSITIVE Sensitive     * >=100,000 COLONIES/mL KLEBSIELLA PNEUMONIAE  Culture, blood (routine x 2)     Status: None   Collection Time: 12/23/14 10:05 AM  Result Value Ref Range Status   Specimen Description BLOOD LEFT ANTECUBITAL  Final   Special Requests BOTTLES DRAWN AEROBIC AND ANAEROBIC 5CC  Final   Culture  Setup Time   Final    GRAM POSITIVE COCCI IN CLUSTERS IN BOTH AEROBIC AND ANAEROBIC BOTTLES CRITICAL RESULT CALLED TO, READ BACK BY AND VERIFIED WITH: M MURPHY@0057  12/24/14 MKELLY    Culture ENTEROCOCCUS SPECIES  Final   Report Status 12/25/2014 FINAL  Final   Organism ID, Bacteria ENTEROCOCCUS SPECIES  Final      Susceptibility   Enterococcus species - MIC*    AMPICILLIN <=2 SENSITIVE Sensitive     VANCOMYCIN 2 SENSITIVE Sensitive     GENTAMICIN SYNERGY SENSITIVE Sensitive     LINEZOLID 2 SENSITIVE Sensitive     * ENTEROCOCCUS SPECIES  Culture, blood (routine x 2)     Status: None   Collection Time: 12/23/14 10:55 AM  Result Value Ref Range Status   Specimen Description BLOOD LEFT HAND  Final   Special Requests BOTTLES DRAWN AEROBIC ONLY 3CC  Final   Culture  Setup Time   Final    GRAM POSITIVE COCCI IN CLUSTERS AEROBIC BOTTLE ONLY CRITICAL RESULT CALLED TO, READ BACK BY AND VERIFIED WITH: T ISAACS@[id number]  12/24/14 MKELLY    Culture   Final    ENTEROCOCCUS SPECIES SUSCEPTIBILITIES PERFORMED ON PREVIOUS CULTURE WITHIN THE LAST 5 DAYS.    Report Status 12/25/2014 FINAL  Final   Culture, blood (routine x 2)     Status: None (Preliminary result)   Collection Time: 12/24/14  3:03 PM  Result Value Ref Range Status   Specimen Description BLOOD LEFT HAND  Final   Special Requests BOTTLES DRAWN AEROBIC AND ANAEROBIC 5CC  Final   Culture NO GROWTH 3 DAYS  Final   Report Status PENDING  Incomplete  Culture, blood (routine x 2)  Status: None (Preliminary result)   Collection Time: 12/24/14  3:25 PM  Result Value Ref Range Status   Specimen Description BLOOD LEFT WRIST  Final   Special Requests BOTTLES DRAWN AEROBIC AND ANAEROBIC 5CC  Final   Culture NO GROWTH 3 DAYS  Final   Report Status PENDING  Incomplete     Studies: No results found.  Scheduled Meds: . ampicillin-sulbactam (UNASYN) IV  3 g Intravenous Q24H  . calcitRIOL  0.75 mcg Oral Q T,Th,Sa-HD  . carvedilol  6.25 mg Oral BID WC  . gabapentin  300 mg Oral QHS  . heparin  5,000 Units Subcutaneous 3 times per day  . insulin aspart  0-9 Units Subcutaneous TID WC  . insulin detemir  70 Units Subcutaneous Q24H  . sodium chloride  3 mL Intravenous Q12H   Continuous Infusions: . sodium chloride     Antibiotics Given (last 72 hours)    Date/Time Action Medication Dose Rate   12/24/14 2240 Given   cefTRIAXone (ROCEPHIN) 2 g in dextrose 5 % 50 mL IVPB 2 g 100 mL/hr   12/25/14 1016 Given  [during HD]   vancomycin (VANCOCIN) IVPB 1000 mg/200 mL premix 1,000 mg 200 mL/hr   12/25/14 1713 Given   Ampicillin-Sulbactam (UNASYN) 3 g in sodium chloride 0.9 % 100 mL IVPB 3 g 100 mL/hr   12/26/14 1748 Given   Ampicillin-Sulbactam (UNASYN) 3 g in sodium chloride 0.9 % 100 mL IVPB 3 g 100 mL/hr      Principal Problem:   Acute encephalopathy Active Problems:   Breast cancer   Hypertension   COPD (chronic obstructive pulmonary disease)   Essential hypertension   ESRD (end stage renal disease) on dialysis   Type 2 diabetes mellitus with diabetic nephropathy   Nonischemic cardiomyopathy   Morbidly obese    OSA (obstructive sleep apnea)   Constipation   UTI (lower urinary tract infection)   Systolic CHF, chronic   Encephalopathy, metabolic   Enterococcal bacteremia   Bacteremia    Time spent: 19min    Savannaha Stonerock  [facility] Pager [phone]. If 7PM-7AM, please contact night-coverage at [email or web address], password [facility] 12/27/2014, 2:08 PM  LOS: 4 days

## 2014-12-27 NOTE — Evaluation (Signed)
Physical Therapy Evaluation Patient Details Name: Jordan [name] MRN: [id number] DOB: Mar 30, 1941 Today's Date: 12/27/2014   History of Present Illness  74 y.o. female with PMH of hypertension, diabetes mellitus, GERD, breast cancer, COPD, arthritis, chronic kidney disease (unknown stage), chronic leg edema, who presents with shortness of breath and syncope. s/p cardiac cath 10/19/14, HD initiated since admission, awaiting permanent AVG. Pt visiting from [place] for 3 weeks where she plans to return to care for sister s/p CVA and her spouse is in a SNF in [place].   Clinical Impression   Pt admitted with above diagnosis. Pt currently with functional limitations due to the deficits listed below (see PT Problem List).  Pt will benefit from skilled PT to increase their independence and safety with mobility to allow discharge to the venue listed below.       Follow Up Recommendations Home health PT;Supervision for mobility/OOB    Equipment Recommendations  None recommended by PT    Recommendations for Other Services       Precautions / Restrictions Precautions Precautions: Fall Precaution Comments: Fall risk reduced with use of RW      Mobility  Bed Mobility Overal bed mobility: Needs Assistance Bed Mobility: Sit to Supine       Sit to supine: Min guard   General bed mobility comments: Assist mostly to keep linens from bunching up under pt while getting back into bed; inefficient movement, but not needing physical asssit  Transfers Overall transfer level: Needs assistance Equipment used: Rolling walker (2 wheeled) Transfers: Sit to/from Stand Sit to Stand: Min guard         General transfer comment: Pt performed sit to stand transfer slow and steady combined with a forward rocking motion prior to standing.  Ambulation/Gait Ambulation/Gait assistance: Min guard Ambulation Distance (Feet): 3 Feet (pivot steps chair to bed) Assistive device: Rolling walker (2 wheeled)       General Gait Details: Dependent on UE support for steadiness  Stairs            Wheelchair Mobility    Modified Rankin (Stroke Patients Only)       Balance             Standing balance-Leahy Scale: Poor                               Pertinent Vitals/Pain Pain Assessment: No/denies pain    Home Living Family/patient expects to be discharged to:: Private residence Living Arrangements: Other relatives Biomedical scientist) Available Help at Discharge: Family;Available PRN/intermittently (Grandson is in college) Type of Home: House Home Access: Level entry     Home Layout: Two level;Able to live on main level with bedroom/bathroom Home Equipment: Gilford Rile - 2 wheels;Shower seat;Walker - 4 wheels;Grab bars - tub/shower;Hand held shower head Additional Comments: Pt reports level entrance into home, however, previous documentation reports stairs prior to entry of home    Prior Function Level of Independence: Needs assistance   Gait / Transfers Assistance Needed: with RW  ADL's / Homemaking Assistance Needed: Family does housework and cooking. Pt requires assistance from daughter for bathing        Hand Dominance   Dominant Hand: Right    Extremity/Trunk Assessment   Upper Extremity Assessment: Defer to OT evaluation           Lower Extremity Assessment: Generalized weakness      Cervical / Trunk Assessment: Normal  Communication   Communication:  No difficulties;HOH  Cognition Arousal/Alertness: Awake/alert Behavior During Therapy: WFL for tasks assessed/performed;Flat affect Overall Cognitive Status: Within Functional Limits for tasks assessed                      General Comments      Exercises        Assessment/Plan    PT Assessment Patient needs continued PT services  PT Diagnosis Difficulty walking;Generalized weakness   PT Problem List Decreased strength;Decreased range of motion;Decreased activity tolerance;Decreased  balance;Decreased mobility;Decreased knowledge of use of DME;Obesity  PT Treatment Interventions DME instruction;Gait training;Stair training;Functional mobility training;Therapeutic activities;Therapeutic exercise;Patient/family education   PT Goals (Current goals can be found in the Care Plan section) Acute Rehab PT Goals Patient Stated Goal: return home PT Goal Formulation: With patient Time For Goal Achievement: 01/10/15 Potential to Achieve Goals: Good    Frequency Min 3X/week   Barriers to discharge        Co-evaluation               End of Session   Activity Tolerance: Patient limited by fatigue Patient left: in bed;with call bell/phone within reach Nurse Communication: Mobility status         Time: [id number] PT Time Calculation (min) (ACUTE ONLY): 14 min   Charges:   PT Evaluation $Initial PT Evaluation Tier I: 1 Procedure     PT G CodesQuin Hoop 12/27/2014, 2:50 PM  Roney Marion, Nora Pager [phone] Office [phone]

## 2014-12-27 NOTE — Consult Note (Signed)
Chief Complaint: Patient was seen in consultation today for new placement tunneled hemodialysis catheter Chief Complaint  Patient presents with  . Weakness   at the request of Dr Jonnie Finner  Referring Physician(s): Dr Jonnie Finner  History of Present Illness: Jordan [name] is a 74 y.o. female   ESRD R IJ tunneled dialysis catheter placed 10/21/2014 Admitted with bacteremia: 9/8 Encephalopathy + enterococcal bacteremia per [facility] 9/8 Neg 9/9 Removal Rt IJ tunneled catheter 9/10 in IR For replacement of tunneled dialysis catheter scheduled for 9/13 Per Dr Jonnie Finner request  Past Medical History  Diagnosis Date  . Breast cancer   . Diabetes mellitus with ESRD (end-stage renal disease)   . Hypertension   . Arthritis   . COPD (chronic obstructive pulmonary disease)   . Renal disorder     Family reports acute renal failure  . GERD (gastroesophageal reflux disease)   . CKD (chronic kidney disease)   . Systolic CHF, chronic [id number]    Past Surgical History  Procedure Laterality Date  . Breast surgery Left   . Cardiac catheterization [name] 10/19/2014    Procedure: Right/Left Heart Cath and Coronary Angiography;  Surgeon: Peter M Martinique, MD;  Location: [place] [facility];  Service: Cardiovascular;  Laterality: [name];  . Av fistula placement Right 10/26/2014    Procedure: Right Brachiocephalic Arteriovenous FISTULA CREATION;  Surgeon: Conrad [place], MD;  Location: [place];  Service: Vascular;  Laterality: Right;    Allergies: Review of patient's allergies indicates no known allergies.  Medications: Prior to Admission medications   Medication Sig Start Date End Date Taking? Authorizing Provider  albuterol (PROVENTIL HFA;VENTOLIN HFA) 108 (90 BASE) MCG/ACT inhaler Inhale 1-2 puffs into the lungs every 6 (six) hours as needed for wheezing or shortness of breath.   Yes Historical Provider, MD  carvedilol (COREG) 6.25 MG tablet Take 1 tablet (6.25 mg total) by mouth 2 (two) times daily  with a meal. 10/27/14  Yes Barton Dubois, MD  gabapentin (NEURONTIN) 300 MG capsule Take 1 capsule (300 mg total) by mouth 2 (two) times daily. Patient taking differently: Take 300 mg by mouth 2 (two) times daily. Only take once a day on Tuesday's, Thursday's, and Saturday's (skip morning dosing because of dialysis) 11/10/14  Yes Dayna N Dunn, PA-C  HYDROcodone-acetaminophen (NORCO/VICODIN) 5-325 MG per tablet Take 1 tablet by mouth every 6 (six) hours as needed for moderate pain.   Yes Historical Provider, MD  Insulin Detemir (LEVEMIR FLEXPEN) 100 UNIT/ML Pen Inject 70 Units into the skin every morning.   Yes Historical Provider, MD  insulin lispro (HUMALOG KWIKPEN) 100 UNIT/ML KiwkPen Inject 1-5 Units into the skin 3 (three) times daily as needed (blood sugar).    Yes Historical Provider, MD  Multiple Vitamins-Minerals (CENTRUM SILVER ADULT 50+ PO) Take 1 tablet by mouth daily.   Yes Historical Provider, MD     Family History  Problem Relation Age of Onset  . Diabetes Mother   . Hypertension Mother   . Stroke Sister   . Hypertension Sister   . Heart attack Neg Hx     Social History   Social History  . Marital Status: Single    Spouse Name: [name]  . Number of Children: [name]  . Years of Education: [name]   Social History Main Topics  . Smoking status: Never Smoker   . Smokeless tobacco: None  . Alcohol Use: No  . Drug Use: None  . Sexual Activity: Not Asked   Other Topics Concern  .  None   Social History Narrative     Review of Systems: A 12 point ROS discussed and pertinent positives are indicated in the HPI above.  All other systems are negative.  Review of Systems  Respiratory: Negative for shortness of breath.   Gastrointestinal: Negative for nausea and vomiting.  Neurological: Negative for weakness.  Psychiatric/Behavioral: Negative for behavioral problems and confusion.    Vital Signs: BP 122/45 mmHg  Pulse 70  Temp(Src) 98.3 F (36.8 C) (Oral)  Resp 17  Ht 5\' 4"   (1.626 m)  Wt 276 lb 14.4 oz (125.6 kg)  BMI 47.51 kg/m2  SpO2 96%  Physical Exam  Constitutional: She is oriented to person, place, and time.  Cardiovascular: Normal rate and regular rhythm.   Pulmonary/Chest: Effort normal and breath sounds normal.  Abdominal: Soft. Bowel sounds are normal.  Musculoskeletal: Normal range of motion.  Neurological: She is alert and oriented to person, place, and time.  Skin: Skin is warm.  Psychiatric: She has a normal mood and affect. Her behavior is normal. Judgment and thought content normal.  Nursing note and vitals reviewed.   Mallampati Score:  MD Evaluation Airway: WNL Heart: WNL Abdomen: WNL Chest/ Lungs: WNL ASA  Classification: 3 Mallampati/Airway Score: One  Imaging: Ct Head Wo Contrast  12/23/2014   CLINICAL DATA:  Left facial droop, generalized weakness and slurred speech.  EXAM: CT HEAD WITHOUT CONTRAST  TECHNIQUE: Contiguous axial images were obtained from the base of the skull through the vertex without intravenous contrast.  COMPARISON:  None.  FINDINGS: The brain appears normal without hemorrhage, infarct, mass lesion, mass effect, midline shift or abnormal extra-axial fluid collection. No hydrocephalus or pneumocephalus. The calvarium is intact. Imaged paranasal sinuses and mastoid air cells are clear.  IMPRESSION: Negative exam.  These results were called by telephone at the time of interpretation on 12/23/2014 at 8:58 am to Dr. Janann Colonel, who verbally acknowledged these results.   Electronically Signed   By: Inge Rise M.D.   On: 12/23/2014 09:02   Ir Removal Tun Cv Cath W/o Fl  12/25/2014   INDICATION: 74 year old female with a history of renal failure and a right-sided IJ central catheter placed October 21, 2014. She presents for removal given bacteremia. She will undergo a line holiday with placement of new catheter next week.  EXAM: TUNNELED CENTRAL VENOUS HEMODIALYSIS CATHETER REMOVAL  MEDICATIONS: NONE  CONTRAST:  None   ANESTHESIA/SEDATION: None  FLUOROSCOPY TIME:  None  COMPLICATIONS: None  PROCEDURE: Informed written consent was obtained from the patient after a discussion of the risks, benefits, and alternatives to treatment. Questions regarding the procedure were encouraged and answered. The right neck and chest, including the indwelling line were prepped with chlorhexidine in a sterile fashion, and a sterile drape was applied covering the operative field. Maximum barrier sterile technique with sterile gowns and gloves were used for the procedure. A timeout was performed prior to the initiation of the procedure.  1% lidocaine was used for local anesthesia.  Using blunt and sharp dissection, the tunneled catheter was removed in its entirety. A sterile dressing was placed.  The patient tolerated the procedure well and remained hemodynamically stable throughout.  No complications encountered and no significant blood loss encountered.  IMPRESSION: Status post bedside removal of tunneled HD catheter for a line holiday.  Signed,  Dulcy Fanny. Earleen Newport, DO  [facility]  [facility]   Electronically Signed   By: Corrie Mckusick D.O.   On: 12/25/2014 14:09  Dg Chest Port 1 View  12/23/2014   CLINICAL DATA:  Fever.  EXAM: PORTABLE CHEST - 1 VIEW  COMPARISON:  10/15/2014  FINDINGS: There has been an interval insertion of dual lumen central venous catheter from right internal jugular approach, tip overlying expected location of the right atrium.  Cardiomediastinal silhouette is stably enlarged.  There is no evidence of pneumothorax. Left hemidiaphragm is obscured which may be due to pleural effusion and atelectasis or airspace consolidation. Previously noted widespread airspace opacities have improved.  Osseous structures are without acute abnormality. Soft tissues are grossly normal.  IMPRESSION: Overall improved aeration of the lungs, with residual left lower lung opacity, which may represent  pleural effusion with atelectasis, or focal airspace consolidation.  Stable cardiomegaly.   Electronically Signed   By: Fidela Salisbury M.D.   On: 12/23/2014 09:34    Labs:  CBC:  Recent Labs  12/24/14 0710 12/25/14 0552 12/25/14 0709 12/26/14 0608  WBC 10.8* 10.7* 10.9* 12.6*  HGB 9.9* 9.2* 9.7* 10.0*  HCT 32.8* 31.1* 31.7* 34.6*  PLT 245 256 245 280    COAGS:  Recent Labs  10/12/14 0745 10/15/14 0324 10/21/14 0340  INR 1.06 1.09 1.05  APTT 33  --   --     BMP:  Recent Labs  12/24/14 0710 12/25/14 0552 12/25/14 0709 12/26/14 0608  NA 136 135 134* 136  K 3.7 3.6 3.6 3.8  CL 98* 97* 97* 98*  CO2 28 26 26 26   GLUCOSE 164* 91 98 102*  BUN 30* 38* 39* 24*  CALCIUM 8.6* 8.5* 8.6* 8.8*  CREATININE 6.05* 7.75* 7.79* 6.43*  GFRNONAA 6* 5* 5* 6*  GFRAA 7* 5* 5* 7*    LIVER FUNCTION TESTS:  Recent Labs  10/12/14 0100  10/25/14 0830 10/27/14 0924 12/24/14 0710 12/25/14 0709  BILITOT 0.2*  --   --   --   --   --   AST 59*  --   --   --   --   --   ALT 37  --   --   --   --   --   ALKPHOS 101  --   --   --   --   --   PROT 6.9  --   --   --   --   --   ALBUMIN 3.1*  < > 2.7* 2.6* 2.8* 2.6*  < > = values in this interval not displayed.  TUMOR MARKERS: No results for input(s): AFPTM, CEA, CA199, CHROMGRNA in the last 8760 hours.  Assessment and Plan:  ESRD +bacteremia; presumed Rt IJ catheter placed 10/21/14 Removed 9/10 afternoon Now scheduled for new replacement 9/13 in IR Risks and Benefits discussed with the patient including, but not limited to bleeding, infection, vascular injury, pneumothorax which may require chest tube placement, air embolism or even death All of the patient's questions were answered, patient is agreeable to proceed. Consent signed and in chart.  Thank you for this interesting consult.  I greatly enjoyed meeting Jaylia Jacober [name] and look forward to participating in their care.  A copy of this report was sent to the  requesting provider on this date.  Signed: Richmond Coldren A 12/27/2014, 9:20 AM   I spent a total of 20 Minutes    in face to face in clinical consultation, greater than 50% of which was counseling/coordinating care for tunneled HD cath

## 2014-12-27 NOTE — Progress Notes (Signed)
    [facility] has been requested to perform a transesophageal echocardiogram on Jordan [name] for Enterococcal bacteremia.  After careful review of history and examination, the risks and benefits of transesophageal echocardiogram have been explained including risks of esophageal damage, perforation (1:10,000 risk), bleeding, pharyngeal hematoma as well as other potential complications associated with conscious sedation including aspiration, arrhythmia, respiratory failure and death. Alternatives to treatment were discussed, questions were answered. Patient is willing to proceed.   Has a history on Echo 09/2014 of mitral valve with calcified annulus.  PA pk pressure was 57 mm Hg.  EF 30%.  Also non obstructive coronary disease on cath  10/2014.  VS stable.  INGOLD,LAURA R, NP 12/27/2014 10:32 AM

## 2014-12-27 NOTE — Care Management Important Message (Signed)
Important Message  Patient Details  Name: Jordan [name] MRN: [id number] Date of Birth: 07-14-1940   Medicare Important Message Given:  Yes-third notification given    Loann Quill 12/27/2014, 12:39 PM

## 2014-12-27 NOTE — Interval H&P Note (Signed)
History and Physical Interval Note:  12/27/2014 2:56 PM  Jordan [name]  has presented today for surgery, with the diagnosis of bactermia  The various methods of treatment have been discussed with the patient and family. After consideration of risks, benefits and other options for treatment, the patient has consented to  Procedure(s): TRANSESOPHAGEAL ECHOCARDIOGRAM (TEE) ([name]) as a surgical intervention .  The patient's history has been reviewed, patient examined, no change in status, stable for surgery.  I have reviewed the patient's chart and labs.  Questions were answered to the patient's satisfaction.     Blondine Hottel, Wonda Cheng

## 2014-12-27 NOTE — Progress Notes (Signed)
[facility] Progress Note  Assessment/Plan: 1. AMS/febrile illness  Blood cultures Gram + cocci/enterococcus. Klebsiella UTI. Vanc and Rocephin Dcd changed to Unasyn. HD cath likely source of bacteremia- cath removed yesterday post HD. ID following TEE today - ate breakfast so  It was delayed- will need Vancomycin after d/c 2. ESRD - TTS - catheter was removed 9/10, AVF didn't run well- had infiltration IR to place new TDC before next HD; not clear if some of the problem is depth, her large fatty arms or tortuousness of AVF. Plan to rest AVF. HD tomorrow 3. Hypertension/volume - BP here variable but overall controlled. Given morbid obesity, left lymphedema, it is difficult to assess volume status; on coreg 6.25 bid at home - under edw. 4. Anemia - Hgb 10- watch CBC - Mircera dosed 9/6 5. Metabolic bone disease - phos 4.9 Continue calcitriol; not on binder or sensipar 6. Nutrition - morbidly obese. EDW lowered some since starting HD, but most decrease likely to volume removal. Renal diet. alb 2.6 7. Hx breast cancer s/p left mastectomy - chronic left arm lymphedema 8. NICM EF 30%, recent NSTEMI low dose BB 9. DM 10. HX OSA - referred to pul 8/24 by PCP- has used CPAP in the past  [name], PA-C [place] [phone] 12/27/2014,8:48 AM  LOS: 4 days    Renal Attending:  Agree with above. Access issues remain. Will prob need fistulogram after discharge when swelling improved to better evaluate anatomy. Obera Stauch C   Subjective:   Ate breakfast but c/o hunger and not being able to eat pre-procedure Objective Filed Vitals:   12/26/14 2116 12/27/14 0438 12/27/14 0457 12/27/14 0700  BP: 100/34 103/30  122/45  Pulse: 63 63  70  Temp: 98.1 F (36.7 C) 97.8 F (36.6 C)  98.3 F (36.8 C)  TempSrc: Oral Oral  Oral  Resp: 18 17  17   Height:      Weight: 124.9 kg (275 lb 5.7 oz)  125.6 kg (276 lb 14.4 oz)   SpO2: 100% 100%  96%    Physical Exam General: morbidly obese NAD sitting in chair Heart: RRR somewhat distant Lungs: few soft exp wheezes Abdomen: obese soft Extremities: obese tr L ~1+ RLE edema Dialysis Access: right AVF + bruit - arms large with sign bruising, but soft  Dialysis Orders: [facility] 4.25 180 400/800 EDW 123 3 K 2.5 Ca profile 2 heparin 13,000 venofer 100 through 9/20 Mircer 100 q 2 weeks last given 9/6 calcitriol 0.75 Recent: Hgb 10.8 9/1 13% sat iPH 327 have been using 1 needle in AVF The past 3 treatments at Qb 250  Additional Objective Labs: Basic Metabolic Panel:  Recent Labs Lab 12/24/14 0710 12/25/14 0552 12/25/14 0709 12/26/14 0608  NA 136 135 134* 136  K 3.7 3.6 3.6 3.8  CL 98* 97* 97* 98*  CO2 28 26 26 26   GLUCOSE 164* 91 98 102*  BUN 30* 38* 39* 24*  CREATININE 6.05* 7.75* 7.79* 6.43*  CALCIUM 8.6* 8.5* 8.6* 8.8*  PHOS 4.1  --  4.9*  --    Liver Function Tests:  Recent Labs Lab 12/24/14 0710 12/25/14 0709  ALBUMIN 2.8* 2.6*  CBC:  Recent Labs Lab 12/23/14 0900 12/24/14 0710 12/25/14 0552 12/25/14 0709 12/26/14 0608  WBC 14.1* 10.8* 10.7* 10.9* 12.6*  HGB 11.0* 9.9* 9.2* 9.7* 10.0*  HCT 36.0 32.8* 31.1* 31.7* 34.6*  MCV 82.4 82.4 83.2 82.6 83.8  PLT 232 245 256 245 280  Blood Culture    Component Value Date/Time   SDES BLOOD LEFT WRIST 12/24/2014 1525   SPECREQUEST BOTTLES DRAWN AEROBIC AND ANAEROBIC 5CC 12/24/2014 1525   CULT NO GROWTH 2 DAYS 12/24/2014 1525   REPTSTATUS PENDING 12/24/2014 1525    CBG:  Recent Labs Lab 12/26/14 0746 12/26/14 1150 12/26/14 1624 12/26/14 2113 12/27/14 0754  GLUCAP 142* 151* 139* 136* 145*   Medications:   . ampicillin-sulbactam (UNASYN) IV  3 g Intravenous Q24H  . calcitRIOL  0.75 mcg Oral Q T,Th,Sa-HD  . carvedilol  6.25 mg Oral BID WC  . heparin  5,000 Units Subcutaneous 3 times per day  . insulin aspart  0-9 Units Subcutaneous TID WC  . insulin detemir  70 Units Subcutaneous Q24H  . sodium  chloride  3 mL Intravenous Q12H

## 2014-12-27 NOTE — Progress Notes (Signed)
INFECTIOUS DISEASE PROGRESS NOTE  ID: Jordan [name] is a 74 y.o. female with  Principal Problem:   Acute encephalopathy Active Problems:   Breast cancer   Hypertension   COPD (chronic obstructive pulmonary disease)   Essential hypertension   ESRD (end stage renal disease) on dialysis   Type 2 diabetes mellitus with diabetic nephropathy   Nonischemic cardiomyopathy   Morbidly obese   OSA (obstructive sleep apnea)   Constipation   UTI (lower urinary tract infection)   Systolic CHF, chronic   Encephalopathy, metabolic   Enterococcal bacteremia   Bacteremia  Subjective: without complaints  Abtx:  Anti-infectives    Start     Dose/Rate Route Frequency Ordered Stop   12/28/14 1000  ceFAZolin (ANCEF) 3 g in dextrose 5 % 50 mL IVPB     3 g 160 mL/hr over 30 Minutes Intravenous To Radiology 12/27/14 1949 12/29/14 1000   12/27/14 1730  ceFAZolin (ANCEF) 3 g in dextrose 5 % 50 mL IVPB  Status:  Discontinued     3 g 160 mL/hr over 30 Minutes Intravenous To Radiology 12/27/14 1728 12/27/14 1806   12/25/14 1800  cefTRIAXone (ROCEPHIN) 2 g in dextrose 5 % 50 mL IVPB  Status:  Discontinued     2 g 100 mL/hr over 30 Minutes Intravenous Every T-Th-Sa (1800) 12/24/14 1351 12/24/14 1420   12/25/14 1600  Ampicillin-Sulbactam (UNASYN) 3 g in sodium chloride 0.9 % 100 mL IVPB     3 g 100 mL/hr over 60 Minutes Intravenous Every 24 hours 12/25/14 1451     12/25/14 1200  vancomycin (VANCOCIN) IVPB 1000 mg/200 mL premix  Status:  Discontinued     1,000 mg 200 mL/hr over 60 Minutes Intravenous Every T-Th-Sa (Hemodialysis) 12/23/14 1540 12/25/14 1451   12/24/14 2000  cefTRIAXone (ROCEPHIN) 2 g in dextrose 5 % 50 mL IVPB  Status:  Discontinued     2 g 100 mL/hr over 30 Minutes Intravenous Every 24 hours 12/24/14 1420 12/25/14 1451   12/23/14 1900  cefTRIAXone (ROCEPHIN) 1 g in dextrose 5 % 50 mL IVPB  Status:  Discontinued     1 g 100 mL/hr over 30 Minutes Intravenous Every 24 hours  12/23/14 1858 12/24/14 1420   12/23/14 1600  vancomycin (VANCOCIN) 2,000 mg in sodium chloride 0.9 % 250 mL IVPB     2,000 mg 250 mL/hr over 60 Minutes Intravenous  Once 12/23/14 1540 12/23/14 1734   12/23/14 1000  vancomycin (VANCOCIN) IVPB 1000 mg/200 mL premix     1,000 mg 200 mL/hr over 60 Minutes Intravenous  Once 12/23/14 0945 12/23/14 1129   12/23/14 1000  piperacillin-tazobactam (ZOSYN) IVPB 3.375 g     3.375 g 12.5 mL/hr over 240 Minutes Intravenous  Once 12/23/14 0945 12/23/14 1404      Medications:  Scheduled: . ampicillin-sulbactam (UNASYN) IV  3 g Intravenous Q24H  . calcitRIOL  0.75 mcg Oral Q T,Th,Sa-HD  . carvedilol  6.25 mg Oral BID WC  . [START ON 12/28/2014]  ceFAZolin (ANCEF) IV  3 g Intravenous to XRAY  . gabapentin  300 mg Oral QHS  . heparin  5,000 Units Subcutaneous 3 times per day  . insulin aspart  0-9 Units Subcutaneous TID WC  . insulin detemir  70 Units Subcutaneous Q24H  . sodium chloride  3 mL Intravenous Q12H    Objective: Vital signs in last 24 hours: Temp:  [97.8 F (36.6 C)-98.3 F (36.8 C)] 98.3 F (36.8 C) (09/12 1523) Pulse Rate:  [  62-76] 66 (09/12 1525) Resp:  [15-23] 17 (09/12 1530) BP: (100-187)/(30-64) 143/43 mmHg (09/12 1530) SpO2:  [93 %-100 %] 99 % (09/12 1535) Weight:  [124.9 kg (275 lb 5.7 oz)-125.6 kg (276 lb 14.4 oz)] 125.6 kg (276 lb 14.4 oz) (09/12 0457)   General appearance: alert, cooperative and no distress Resp: clear to auscultation bilaterally Cardio: regular rate and rhythm GI: normal findings: bowel sounds normal and soft, non-tender  Lab Results  Recent Labs  12/25/14 0709 12/26/14 0608  WBC 10.9* 12.6*  HGB 9.7* 10.0*  HCT 31.7* 34.6*  NA 134* 136  K 3.6 3.8  CL 97* 98*  CO2 26 26  BUN 39* 24*  CREATININE 7.79* 6.43*   Liver Panel  Recent Labs  12/25/14 0709  ALBUMIN 2.6*   Sedimentation Rate No results for input(s): ESRSEDRATE in the last 72 hours. C-Reactive Protein No results for  input(s): CRP in the last 72 hours.  Microbiology: Recent Results (from the past 240 hour(s))  Urine culture     Status: None   Collection Time: 12/23/14  9:41 AM  Result Value Ref Range Status   Specimen Description URINE, CATHETERIZED  Final   Special Requests Normal  Final   Culture >=100,000 COLONIES/mL KLEBSIELLA PNEUMONIAE  Final   Report Status 12/25/2014 FINAL  Final   Organism ID, Bacteria KLEBSIELLA PNEUMONIAE  Final      Susceptibility   Klebsiella pneumoniae - MIC*    AMPICILLIN >=32 RESISTANT Resistant     CEFAZOLIN <=4 SENSITIVE Sensitive     CEFTRIAXONE <=1 SENSITIVE Sensitive     CIPROFLOXACIN <=0.25 SENSITIVE Sensitive     GENTAMICIN <=1 SENSITIVE Sensitive     IMIPENEM <=0.25 SENSITIVE Sensitive     NITROFURANTOIN 64 INTERMEDIATE Intermediate     TRIMETH/SULFA <=20 SENSITIVE Sensitive     AMPICILLIN/SULBACTAM 8 SENSITIVE Sensitive     PIP/TAZO <=4 SENSITIVE Sensitive     * >=100,000 COLONIES/mL KLEBSIELLA PNEUMONIAE  Culture, blood (routine x 2)     Status: None   Collection Time: 12/23/14 10:05 AM  Result Value Ref Range Status   Specimen Description BLOOD LEFT ANTECUBITAL  Final   Special Requests BOTTLES DRAWN AEROBIC AND ANAEROBIC 5CC  Final   Culture  Setup Time   Final    GRAM POSITIVE COCCI IN CLUSTERS IN BOTH AEROBIC AND ANAEROBIC BOTTLES CRITICAL RESULT CALLED TO, READ BACK BY AND VERIFIED WITH: M MURPHY@[id number]  12/24/14 MKELLY    Culture ENTEROCOCCUS SPECIES  Final   Report Status 12/25/2014 FINAL  Final   Organism ID, Bacteria ENTEROCOCCUS SPECIES  Final      Susceptibility   Enterococcus species - MIC*    AMPICILLIN <=2 SENSITIVE Sensitive     VANCOMYCIN 2 SENSITIVE Sensitive     GENTAMICIN SYNERGY SENSITIVE Sensitive     LINEZOLID 2 SENSITIVE Sensitive     * ENTEROCOCCUS SPECIES  Culture, blood (routine x 2)     Status: None   Collection Time: 12/23/14 10:55 AM  Result Value Ref Range Status   Specimen Description BLOOD LEFT HAND  Final    Special Requests BOTTLES DRAWN AEROBIC ONLY 3CC  Final   Culture  Setup Time   Final    GRAM POSITIVE COCCI IN CLUSTERS AEROBIC BOTTLE ONLY CRITICAL RESULT CALLED TO, READ BACK BY AND VERIFIED WITH: T [email or web address]@[id number]  12/24/14 MKELLY    Culture   Final    ENTEROCOCCUS SPECIES SUSCEPTIBILITIES PERFORMED ON PREVIOUS CULTURE WITHIN THE LAST 5 DAYS.    Report Status 12/25/2014  FINAL  Final  Culture, blood (routine x 2)     Status: None (Preliminary result)   Collection Time: 12/24/14  3:03 PM  Result Value Ref Range Status   Specimen Description BLOOD LEFT HAND  Final   Special Requests BOTTLES DRAWN AEROBIC AND ANAEROBIC 5CC  Final   Culture NO GROWTH 3 DAYS  Final   Report Status PENDING  Incomplete  Culture, blood (routine x 2)     Status: None (Preliminary result)   Collection Time: 12/24/14  3:25 PM  Result Value Ref Range Status   Specimen Description BLOOD LEFT WRIST  Final   Special Requests BOTTLES DRAWN AEROBIC AND ANAEROBIC 5CC  Final   Culture NO GROWTH 3 DAYS  Final   Report Status PENDING  Incomplete    Studies/Results: No results found.   Assessment/Plan: Enterococcal Bacteremia (pan sens) Changed her to unasyn, would giver her this for 2 weeks.  This will cover her enterococcus and her klebsiella Await repeat BCx. So far ngtd  Will need repeat BCx 1 week after finishing her anbx TEE (-)  Line related bacteremia UTI (kleb pneumonia R-amp, I- nitro) will change her anbx to unasyn  ESRD DM2 with nephropathy Continue to follow FSG Remains mildly up. Aim for better control   Non-isch CM (EF 30%) Non-ST MI (July 2016)  Total days of antibiotics: 3 vanco/ceftriaxone--> day 2 unasyn  Available as needed.          Bobby Rumpf Infectious Diseases (pager) [phone] [email or web address] 12/27/2014, 8:01  PM  LOS: 4 days

## 2014-12-27 NOTE — CV Procedure (Signed)
    Transesophageal Echocardiogram Note  Jordan [name] [id number] 07-Sep-1940  Procedure: Transesophageal Echocardiogram Indications: bacteremia  Procedure Details Consent: Obtained Time Out: Verified patient identification, verified procedure, site/side was marked, verified correct patient position, special equipment/implants available, Radiology Safety Procedures followed,  medications/allergies/relevent history reviewed, required imaging and test results available.  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   Jordan [name], Jordan [name]., MD, [facility] 12/27/2014, 3:16 PM

## 2014-12-28 ENCOUNTER — Inpatient Hospital Stay (HOSPITAL_COMMUNITY): Payer: Medicare (Managed Care)

## 2014-12-28 LAB — BASIC METABOLIC PANEL WITH GFR
Anion gap: 12 (ref 5–15)
BUN: 43 mg/dL — ABNORMAL HIGH (ref 6–20)
CO2: 24 mmol/L (ref 22–32)
Calcium: 8.4 mg/dL — ABNORMAL LOW (ref 8.9–10.3)
Chloride: 97 mmol/L — ABNORMAL LOW (ref 101–111)
Creatinine, Ser: 9.29 mg/dL — ABNORMAL HIGH (ref 0.44–1.00)
GFR calc Af Amer: 4 mL/min — ABNORMAL LOW
GFR calc non Af Amer: 4 mL/min — ABNORMAL LOW
Glucose, Bld: 131 mg/dL — ABNORMAL HIGH (ref 65–99)
Potassium: 4 mmol/L (ref 3.5–5.1)
Sodium: 133 mmol/L — ABNORMAL LOW (ref 135–145)

## 2014-12-28 LAB — GLUCOSE, CAPILLARY
GLUCOSE-CAPILLARY: 122 mg/dL — AB (ref 65–99)
GLUCOSE-CAPILLARY: 72 mg/dL (ref 65–99)
GLUCOSE-CAPILLARY: 31 mg/dL — AB (ref 65–99)
GLUCOSE-CAPILLARY: 58 mg/dL — AB (ref 65–99)
GLUCOSE-CAPILLARY: 104 mg/dL — AB (ref 65–99)
Glucose-Capillary: 136 mg/dL — ABNORMAL HIGH (ref 65–99)
Glucose-Capillary: 59 mg/dL — ABNORMAL LOW (ref 65–99)
Glucose-Capillary: 59 mg/dL — ABNORMAL LOW (ref 65–99)

## 2014-12-28 LAB — RENAL FUNCTION PANEL
Albumin: 2.8 g/dL — ABNORMAL LOW (ref 3.5–5.0)
Anion gap: 15 (ref 5–15)
BUN: 48 mg/dL — ABNORMAL HIGH (ref 6–20)
CO2: 24 mmol/L (ref 22–32)
Calcium: 8.1 mg/dL — ABNORMAL LOW (ref 8.9–10.3)
Chloride: 94 mmol/L — ABNORMAL LOW (ref 101–111)
Creatinine, Ser: 9.76 mg/dL — ABNORMAL HIGH (ref 0.44–1.00)
GFR calc Af Amer: 4 mL/min — ABNORMAL LOW (ref >60)
GFR calc non Af Amer: 4 mL/min — ABNORMAL LOW (ref >60)
Glucose, Bld: 156 mg/dL — ABNORMAL HIGH (ref 65–99)
Phosphorus: 5.9 mg/dL — ABNORMAL HIGH (ref 2.5–4.6)
Potassium: 3.9 mmol/L (ref 3.5–5.1)
Sodium: 133 mmol/L — ABNORMAL LOW (ref 135–145)

## 2014-12-28 LAB — CBC
HCT: 33.2 % — ABNORMAL LOW (ref 36.0–46.0)
HCT: 34.5 % — ABNORMAL LOW (ref 36.0–46.0)
Hemoglobin: 10.4 g/dL — ABNORMAL LOW (ref 12.0–15.0)
Hemoglobin: 10.6 g/dL — ABNORMAL LOW (ref 12.0–15.0)
MCH: 25.5 pg — ABNORMAL LOW (ref 26.0–34.0)
MCH: 26.1 pg (ref 26.0–34.0)
MCHC: 31.3 g/dL (ref 30.0–36.0)
MCHC: 30.7 g/dL (ref 30.0–36.0)
MCV: 81.4 fL (ref 78.0–100.0)
MCV: 85 fL (ref 78.0–100.0)
PLATELETS: 259 10*3/uL (ref 150–400)
Platelets: 298 10*3/uL (ref 150–400)
RBC: 4.08 MIL/uL (ref 3.87–5.11)
RBC: 4.06 MIL/uL (ref 3.87–5.11)
RDW: 16.5 % — AB (ref 11.5–15.5)
RDW: 16.8 % — ABNORMAL HIGH (ref 11.5–15.5)
WBC: 11.4 10*3/uL — AB (ref 4.0–10.5)
WBC: 11.4 10*3/uL — ABNORMAL HIGH (ref 4.0–10.5)

## 2014-12-28 LAB — PROTIME-INR
INR: 1.09 (ref 0.00–1.49)
Prothrombin Time: 14.3 seconds (ref 11.6–15.2)

## 2014-12-28 LAB — APTT: aPTT: 25 s (ref 24–37)

## 2014-12-28 MED ORDER — CEFAZOLIN SODIUM-DEXTROSE 2-3 GM-% IV SOLR
INTRAVENOUS | Status: AC
  Filled 2014-12-28: qty 50

## 2014-12-28 MED ORDER — SODIUM CHLORIDE 0.9 % IV SOLN: 100.0000 mL | INTRAVENOUS | Status: DC | PRN

## 2014-12-28 MED ORDER — ALTEPLASE 2 MG IJ SOLR
2.0000 mg | Freq: Once | INTRAMUSCULAR | Status: DC
  Filled 2014-12-28: qty 2

## 2014-12-28 MED ORDER — MIDAZOLAM HCL 2 MG/2ML IJ SOLN
INTRAMUSCULAR | Status: AC | PRN
  Administered 2014-12-28: 1 mg via INTRAVENOUS

## 2014-12-28 MED ORDER — HEPARIN SODIUM (PORCINE) 1000 UNIT/ML DIALYSIS: 1000.0000 [IU] | INTRAMUSCULAR | Status: DC | PRN

## 2014-12-28 MED ORDER — LIDOCAINE-PRILOCAINE 2.5-2.5 % EX CREA
1.0000 "application " | TOPICAL_CREAM | CUTANEOUS | Status: DC | PRN
  Filled 2014-12-28: qty 5

## 2014-12-28 MED ORDER — LIDOCAINE-EPINEPHRINE (PF) 1 %-1:200000 IJ SOLN
INTRAMUSCULAR | Status: AC
  Filled 2014-12-28: qty 30

## 2014-12-28 MED ORDER — PENTAFLUOROPROP-TETRAFLUOROETH EX AERO: 1.0000 "application " | INHALATION_SPRAY | CUTANEOUS | Status: DC | PRN

## 2014-12-28 MED ORDER — CEFAZOLIN SODIUM 1-5 GM-% IV SOLN
INTRAVENOUS | Status: AC
  Filled 2014-12-28: qty 50

## 2014-12-28 MED ORDER — PNEUMOCOCCAL VAC POLYVALENT 25 MCG/0.5ML IJ INJ
0.5000 mL | INJECTION | INTRAMUSCULAR | Status: DC
  Filled 2014-12-28: qty 0.5

## 2014-12-28 MED ORDER — FENTANYL CITRATE (PF) 100 MCG/2ML IJ SOLN
INTRAMUSCULAR | Status: AC | PRN
  Administered 2014-12-28: 50 ug via INTRAVENOUS

## 2014-12-28 MED ORDER — MIDAZOLAM HCL 2 MG/2ML IJ SOLN
INTRAMUSCULAR | Status: AC
  Filled 2014-12-28: qty 2

## 2014-12-28 MED ORDER — HEPARIN SODIUM (PORCINE) 1000 UNIT/ML IJ SOLN
INTRAMUSCULAR | Status: AC
  Filled 2014-12-28: qty 1

## 2014-12-28 MED ORDER — INFLUENZA VAC SPLIT QUAD 0.5 ML IM SUSY
0.5000 mL | PREFILLED_SYRINGE | INTRAMUSCULAR | Status: DC
  Filled 2014-12-28: qty 0.5

## 2014-12-28 MED ORDER — GABAPENTIN 300 MG PO CAPS: 300.0000 mg | ORAL_CAPSULE | Freq: Every day | ORAL | Status: DC

## 2014-12-28 MED ORDER — LIDOCAINE HCL (PF) 1 % IJ SOLN: 5.0000 mL | INTRAMUSCULAR | Status: DC | PRN

## 2014-12-28 MED ORDER — ALTEPLASE 2 MG IJ SOLR
2.0000 mg | Freq: Once | INTRAMUSCULAR | Status: AC | PRN
Start: 2014-12-28 — End: 2014-12-28
  Administered 2014-12-28: 3.2 mg
  Filled 2014-12-28: qty 2

## 2014-12-28 MED ORDER — FENTANYL CITRATE (PF) 100 MCG/2ML IJ SOLN
INTRAMUSCULAR | Status: AC
  Filled 2014-12-28: qty 2

## 2014-12-28 NOTE — Sedation Documentation (Signed)
Patient is resting comfortably. 

## 2014-12-28 NOTE — Sedation Documentation (Signed)
Pt resting, c/o back pain from laying on hard table

## 2014-12-28 NOTE — Discharge Summary (Signed)
Physician Discharge Summary  Jordan [name] KNL:[id number] DOB: 08/19/1940 DOA: 12/23/2014  PCP: [name], MD  Admit date: 12/23/2014 Discharge date: 12/28/2014  Time spent:45 minutes  Recommendations for Outpatient Follow-up:  1. Continue IV Vancomcyin for 2 weeks with Dialysis  Discharge Diagnoses:  Principal Problem:   Acute Metabolic encephalopathy   Enterococcal bacteremia   Breast cancer   Hypertension   COPD (chronic obstructive pulmonary disease)   Essential hypertension   ESRD (end stage renal disease) on dialysis   Type 2 diabetes mellitus with diabetic nephropathy   Nonischemic cardiomyopathy   Morbidly obese   OSA (obstructive sleep apnea)   Constipation   UTI (lower urinary tract infection)   Systolic CHF, chronic   Encephalopathy, metabolic   Enterococcal bacteremia   Bacteremia   Discharge Condition: stable  Diet recommendation: Renal Diabetic  Filed Weights   12/26/14 2116 12/27/14 0457 12/27/14 2134  Weight: 124.9 kg (275 lb 5.7 oz) 125.6 kg (276 lb 14.4 oz) 126.2 kg (278 lb 3.5 oz)    History of present illness:  Chief Complaint: Confusion/slurred speech HPI: Jordan [name] is a 74 y.o. femalewith history of diabetes mellitus , insulin-dependent , CHF, End-stage renal disease on hemodialysis Tuesdays and Thursdays and Saturdays, history of breast cancer with left upper extremity lymphedema , hypertension, gastroesophageal reflux disease presented to the ED with confusion, fever.  Hospital Course:  1. Enterococcal Sepsis -improved -Blood cultures on admission grew Enterococcus suspect right chest HD catheter was the source -Was started on IV Vanc on admission, Vanc changed to IV Unasyn 9/10, ID consult by Dr.Hatcher appreciated -HD catheter removed 9/10 -repeat Blood Cx NGTD -TEE negative for endocarditis -new HD catheter placed today -d/w Dr.Hatcher, she can be discharged home on 2 weeks of IV Vancomycin that can be  administered with HD  2. Metabolic encephalopathy -Due to sepsis, mentation back to baseline, no focal signs, CT head and unremarkable  3. ESRD - -has an AV fistula in the right arm which was placed in July, not mature yet, HD catheter removed 9/10 -New dialysis catheter placed 9/13 -Dialyzed per Renal  4. Hypertension/volume -  -Stable, on coreg   5. Anemia of chronic disease and iron deficiency -Stable, continue Aranesp  6. History of breast cancer status post left mastectomy and chronic left arm lymphedema  7. History of chronic systolic CHF/NICM, EF 79%,  -volume managed with HD -Continue Coreg  8. Diabetes mellitus -Stable continue Levemir, sliding scale insulin  9. OSA - referred to pul 8/24 by PCP- has used CPAP in the past  10. Klebsiella UTI  -Now on Unasyn to cover both urine and Enterococcus in blood -completed 6/7 of Abx for this  Procedures:  New R EJ HD catheter placed 9/13  TEE: negative for endocardiis  Old Chest HD catheter removed  Discharge Exam: Filed Vitals:   12/28/14 1747  BP: 143/51  Pulse: 59  Temp:   Resp: 12    General: AAOx3 Cardiovascular: S!S2/RRR Respiratory: CTAB  Discharge Instructions    Current Discharge Medication List    CONTINUE these medications which have CHANGED   Details  gabapentin (NEURONTIN) 300 MG capsule Take 1 capsule (300 mg total) by mouth at bedtime. Qty: 60 capsule, Refills: 6      CONTINUE these medications which have NOT CHANGED   Details  albuterol (PROVENTIL HFA;VENTOLIN HFA) 108 (90 BASE) MCG/ACT inhaler Inhale 1-2 puffs into the lungs every 6 (six) hours as needed for wheezing or shortness of breath.  carvedilol (COREG) 6.25 MG tablet Take 1 tablet (6.25 mg total) by mouth 2 (two) times daily with a meal. Qty: 60 tablet, Refills: 1    HYDROcodone-acetaminophen (NORCO/VICODIN) 5-325 MG per tablet Take 1 tablet by mouth every 6 (six) hours as needed for moderate pain.    Insulin  Detemir (LEVEMIR FLEXPEN) 100 UNIT/ML Pen Inject 70 Units into the skin every morning.    insulin lispro (HUMALOG KWIKPEN) 100 UNIT/ML KiwkPen Inject 1-5 Units into the skin 3 (three) times daily as needed (blood sugar).     Multiple Vitamins-Minerals (CENTRUM SILVER ADULT 50+ PO) Take 1 tablet by mouth daily.       No Known Allergies Follow-up Information    Follow up with [name], MD. Schedule an appointment as soon as possible for a visit in 1 week.   Specialty:  Family Medicine   Contact information:   [address] [place] [address] [phone]        The results of significant diagnostics from this hospitalization (including imaging, microbiology, ancillary and laboratory) are listed below for reference.    Significant Diagnostic Studies: Ct Head Wo Contrast  12/23/2014   CLINICAL DATA:  Left facial droop, generalized weakness and slurred speech.  EXAM: CT HEAD WITHOUT CONTRAST  TECHNIQUE: Contiguous axial images were obtained from the base of the skull through the vertex without intravenous contrast.  COMPARISON:  None.  FINDINGS: The brain appears normal without hemorrhage, infarct, mass lesion, mass effect, midline shift or abnormal extra-axial fluid collection. No hydrocephalus or pneumocephalus. The calvarium is intact. Imaged paranasal sinuses and mastoid air cells are clear.  IMPRESSION: Negative exam.  These results were called by telephone at the time of interpretation on 12/23/2014 at 8:58 am to Dr. Janann Colonel, who verbally acknowledged these results.   Electronically Signed   By: Inge Rise M.D.   On: 12/23/2014 09:02   Ir Fluoro Guide Cv Line Right  12/28/2014   INDICATION: End-stage renal disease. In need of durable intravenous access for continuation of hemodialysis.  Patient with history of prior right internal jugular approach dialysis catheter placement on 10/21/2014 however the catheter was removed on 12/25/2014 given concern for infection.  Patient is currently afebrile with normalization of white count.  EXAM: TUNNELED CENTRAL VENOUS HEMODIALYSIS CATHETER PLACEMENT WITH ULTRASOUND AND FLUOROSCOPIC GUIDANCE  MEDICATIONS: Ancef 3 gm IV; The IV antibiotic was given in an appropriate time interval prior to skin puncture.  CONTRAST:  None  ANESTHESIA/SEDATION: Versed 1.5 mg IV; Fentanyl 75 mcg IV  Total Moderate Sedation Time  15 minutes.  FLUOROSCOPY TIME:  48 seconds (44.3 mGy)  COMPLICATIONS: None immediate  PROCEDURE: Informed written consent was obtained from the patient after a discussion of the risks, benefits, and alternatives to treatment. Questions regarding the procedure were encouraged and answered.  Ultrasound scanning demonstrates hypoechoic occlusive thrombus within the diminutive right internal jugular vein, likely the sequela of recent dialysis catheter placement and subsequent removal. Note was made of a sizable adjacent right external jugular vein which was targeted for dialysis catheter placement given the patient's history of left-sided breast cancer and left upper extremity lymphedema.  The right neck and chest were prepped with chlorhexidine in a sterile fashion, and a sterile drape was applied covering the operative field. Maximum barrier sterile technique with sterile gowns and gloves were used for the procedure. A timeout was performed prior to the initiation of the procedure.  After creating a small venotomy incision, a micropuncture kit was utilized to  access the right external jugular vein under direct, real-time ultrasound guidance after the overlying soft tissues were anesthetized with 1% lidocaine with epinephrine. Ultrasound image documentation was performed. The microwire was kinked to measure appropriate catheter length. A stiff Glidewire was advanced to the level of the IVC and the micropuncture sheath was exchanged for a peel-away sheath. A Palindrome tunneled hemodialysis catheter measuring 19 cm from tip to cuff was  tunneled in a retrograde fashion from the anterior chest wall to the venotomy incision.  The catheter was then placed through the peel-away sheath with tips ultimately positioned within the superior aspect of the right atrium. Final catheter positioning was confirmed and documented with a spot radiographic image. The catheter aspirates and flushes normally. The catheter was flushed with appropriate volume heparin dwells.  The catheter exit site was secured with a 0-Prolene retention suture. The venotomy incision was closed with an interrupted 4-0 Vicryl, Dermabond and Steri-strips. Dressings were applied. The patient tolerated the procedure well without immediate post procedural complication.  IMPRESSION: 1. Successful placement of 19 cm tip to cuff tunneled hemodialysis catheter via the right external jugular vein with tips terminating within the superior aspect of the right atrium. The catheter is ready for immediate use. 2. Occlusion of the right internal jugular vein, likely the sequela of recent dialysis catheter placement and subsequent removal.   Electronically Signed   By: Sandi Mariscal M.D.   On: 12/28/2014 18:02   Ir Removal Tun Cv Cath W/o Fl  12/25/2014   INDICATION: 74 year old female with a history of renal failure and a right-sided IJ central catheter placed October 21, 2014. She presents for removal given bacteremia. She will undergo a line holiday with placement of new catheter next week.  EXAM: TUNNELED CENTRAL VENOUS HEMODIALYSIS CATHETER REMOVAL  MEDICATIONS: NONE  CONTRAST:  None  ANESTHESIA/SEDATION: None  FLUOROSCOPY TIME:  None  COMPLICATIONS: None  PROCEDURE: Informed written consent was obtained from the patient after a discussion of the risks, benefits, and alternatives to treatment. Questions regarding the procedure were encouraged and answered. The right neck and chest, including the indwelling line were prepped with chlorhexidine in a sterile fashion, and a sterile drape was applied  covering the operative field. Maximum barrier sterile technique with sterile gowns and gloves were used for the procedure. A timeout was performed prior to the initiation of the procedure.  1% lidocaine was used for local anesthesia.  Using blunt and sharp dissection, the tunneled catheter was removed in its entirety. A sterile dressing was placed.  The patient tolerated the procedure well and remained hemodynamically stable throughout.  No complications encountered and no significant blood loss encountered.  IMPRESSION: Status post bedside removal of tunneled HD catheter for a line holiday.  Signed,  Dulcy Fanny. Earleen Newport, DO  [facility]  [facility]   Electronically Signed   By: Corrie Mckusick D.O.   On: 12/25/2014 14:09   Ir US Guide Vasc Access Right  12/28/2014   INDICATION: End-stage renal disease. In need of durable intravenous access for continuation of hemodialysis.  Patient with history of prior right internal jugular approach dialysis catheter placement on 10/21/2014 however the catheter was removed on 12/25/2014 given concern for infection. Patient is currently afebrile with normalization of white count.  EXAM: TUNNELED CENTRAL VENOUS HEMODIALYSIS CATHETER PLACEMENT WITH ULTRASOUND AND FLUOROSCOPIC GUIDANCE  MEDICATIONS: Ancef 3 gm IV; The IV antibiotic was given in an appropriate time interval prior to skin puncture.  CONTRAST:  None  ANESTHESIA/SEDATION: Versed 1.5 mg IV; Fentanyl 75 mcg IV  Total Moderate Sedation Time  15 minutes.  FLUOROSCOPY TIME:  48 seconds (97.0 mGy)  COMPLICATIONS: None immediate  PROCEDURE: Informed written consent was obtained from the patient after a discussion of the risks, benefits, and alternatives to treatment. Questions regarding the procedure were encouraged and answered.  Ultrasound scanning demonstrates hypoechoic occlusive thrombus within the diminutive right internal jugular vein, likely the sequela of recent dialysis  catheter placement and subsequent removal. Note was made of a sizable adjacent right external jugular vein which was targeted for dialysis catheter placement given the patient's history of left-sided breast cancer and left upper extremity lymphedema.  The right neck and chest were prepped with chlorhexidine in a sterile fashion, and a sterile drape was applied covering the operative field. Maximum barrier sterile technique with sterile gowns and gloves were used for the procedure. A timeout was performed prior to the initiation of the procedure.  After creating a small venotomy incision, a micropuncture kit was utilized to access the right external jugular vein under direct, real-time ultrasound guidance after the overlying soft tissues were anesthetized with 1% lidocaine with epinephrine. Ultrasound image documentation was performed. The microwire was kinked to measure appropriate catheter length. A stiff Glidewire was advanced to the level of the IVC and the micropuncture sheath was exchanged for a peel-away sheath. A Palindrome tunneled hemodialysis catheter measuring 19 cm from tip to cuff was tunneled in a retrograde fashion from the anterior chest wall to the venotomy incision.  The catheter was then placed through the peel-away sheath with tips ultimately positioned within the superior aspect of the right atrium. Final catheter positioning was confirmed and documented with a spot radiographic image. The catheter aspirates and flushes normally. The catheter was flushed with appropriate volume heparin dwells.  The catheter exit site was secured with a 0-Prolene retention suture. The venotomy incision was closed with an interrupted 4-0 Vicryl, Dermabond and Steri-strips. Dressings were applied. The patient tolerated the procedure well without immediate post procedural complication.  IMPRESSION: 1. Successful placement of 19 cm tip to cuff tunneled hemodialysis catheter via the right external jugular vein with  tips terminating within the superior aspect of the right atrium. The catheter is ready for immediate use. 2. Occlusion of the right internal jugular vein, likely the sequela of recent dialysis catheter placement and subsequent removal.   Electronically Signed   By: Sandi Mariscal M.D.   On: 12/28/2014 18:02   Dg Chest Port 1 View  12/23/2014   CLINICAL DATA:  Fever.  EXAM: PORTABLE CHEST - 1 VIEW  COMPARISON:  10/15/2014  FINDINGS: There has been an interval insertion of dual lumen central venous catheter from right internal jugular approach, tip overlying expected location of the right atrium.  Cardiomediastinal silhouette is stably enlarged.  There is no evidence of pneumothorax. Left hemidiaphragm is obscured which may be due to pleural effusion and atelectasis or airspace consolidation. Previously noted widespread airspace opacities have improved.  Osseous structures are without acute abnormality. Soft tissues are grossly normal.  IMPRESSION: Overall improved aeration of the lungs, with residual left lower lung opacity, which may represent pleural effusion with atelectasis, or focal airspace consolidation.  Stable cardiomegaly.   Electronically Signed   By: Fidela Salisbury M.D.   On: 12/23/2014 09:34    Microbiology: Recent Results (from the past 240 hour(s))  Urine culture     Status: None   Collection Time: 12/23/14  9:41 AM  Result Value  Ref Range Status   Specimen Description URINE, CATHETERIZED  Final   Special Requests Normal  Final   Culture >=100,000 COLONIES/mL KLEBSIELLA PNEUMONIAE  Final   Report Status 12/25/2014 FINAL  Final   Organism ID, Bacteria KLEBSIELLA PNEUMONIAE  Final      Susceptibility   Klebsiella pneumoniae - MIC*    AMPICILLIN >=32 RESISTANT Resistant     CEFAZOLIN <=4 SENSITIVE Sensitive     CEFTRIAXONE <=1 SENSITIVE Sensitive     CIPROFLOXACIN <=0.25 SENSITIVE Sensitive     GENTAMICIN <=1 SENSITIVE Sensitive     IMIPENEM <=0.25 SENSITIVE Sensitive      NITROFURANTOIN 64 INTERMEDIATE Intermediate     TRIMETH/SULFA <=20 SENSITIVE Sensitive     AMPICILLIN/SULBACTAM 8 SENSITIVE Sensitive     PIP/TAZO <=4 SENSITIVE Sensitive     * >=100,000 COLONIES/mL KLEBSIELLA PNEUMONIAE  Culture, blood (routine x 2)     Status: None   Collection Time: 12/23/14 10:05 AM  Result Value Ref Range Status   Specimen Description BLOOD LEFT ANTECUBITAL  Final   Special Requests BOTTLES DRAWN AEROBIC AND ANAEROBIC 5CC  Final   Culture  Setup Time   Final    GRAM POSITIVE COCCI IN CLUSTERS IN BOTH AEROBIC AND ANAEROBIC BOTTLES CRITICAL RESULT CALLED TO, READ BACK BY AND VERIFIED WITH: M [email or web address]  12/24/14 MKELLY    Culture ENTEROCOCCUS SPECIES  Final   Report Status 12/25/2014 FINAL  Final   Organism ID, Bacteria ENTEROCOCCUS SPECIES  Final      Susceptibility   Enterococcus species - MIC*    AMPICILLIN <=2 SENSITIVE Sensitive     VANCOMYCIN 2 SENSITIVE Sensitive     GENTAMICIN SYNERGY SENSITIVE Sensitive     LINEZOLID 2 SENSITIVE Sensitive     * ENTEROCOCCUS SPECIES  Culture, blood (routine x 2)     Status: None   Collection Time: 12/23/14 10:55 AM  Result Value Ref Range Status   Specimen Description BLOOD LEFT HAND  Final   Special Requests BOTTLES DRAWN AEROBIC ONLY 3CC  Final   Culture  Setup Time   Final    GRAM POSITIVE COCCI IN CLUSTERS AEROBIC BOTTLE ONLY CRITICAL RESULT CALLED TO, READ BACK BY AND VERIFIED WITH: T [email or web address]  12/24/14 MKELLY    Culture   Final    ENTEROCOCCUS SPECIES SUSCEPTIBILITIES PERFORMED ON PREVIOUS CULTURE WITHIN THE LAST 5 DAYS.    Report Status 12/25/2014 FINAL  Final  Culture, blood (routine x 2)     Status: None (Preliminary result)   Collection Time: 12/24/14  3:03 PM  Result Value Ref Range Status   Specimen Description BLOOD LEFT HAND  Final   Special Requests BOTTLES DRAWN AEROBIC AND ANAEROBIC 5CC  Final   Culture NO GROWTH 4 DAYS  Final   Report Status PENDING  Incomplete  Culture, blood (routine x 2)      Status: None (Preliminary result)   Collection Time: 12/24/14  3:25 PM  Result Value Ref Range Status   Specimen Description BLOOD LEFT WRIST  Final   Special Requests BOTTLES DRAWN AEROBIC AND ANAEROBIC 5CC  Final   Culture NO GROWTH 4 DAYS  Final   Report Status PENDING  Incomplete  MRSA PCR Screening     Status: None   Collection Time: 12/27/14  7:07 PM  Result Value Ref Range Status   MRSA by PCR NEGATIVE NEGATIVE Final    Comment:        The GeneXpert MRSA Assay (FDA approved for NASAL specimens only), is one component  of a comprehensive MRSA colonization surveillance program. It is not intended to diagnose MRSA infection nor to guide or monitor treatment for MRSA infections.      Labs: Basic Metabolic Panel:  Recent Labs Lab 12/23/14 0900  12/24/14 0710 12/25/14 0552 12/25/14 0709 12/26/14 0608 12/28/14 0445  NA 134*  --  136 135 134* 136 133*  K 4.7  --  3.7 3.6 3.6 3.8 4.0  CL 97*  --  98* 97* 97* 98* 97*  CO2 24  --  _0 GLUCOSE 235*  --  164* 91 98 102* 131*  BUN 62*  --  30* 38* 39* 24* 43*  CREATININE 9.26*  < > 6.05* 7.75* 7.79* 6.43* 9.29*  CALCIUM 8.9  --  8.6* 8.5* 8.6* 8.8* 8.4*  MG 2.0  --   --   --   --   --   --   PHOS  --   --  4.1  --  4.9*  --   --   < > = values in this interval not displayed. Liver Function Tests:  Recent Labs Lab 12/24/14 0710 12/25/14 0709  ALBUMIN 2.8* 2.6*   No results for input(s): LIPASE, AMYLASE in the last 168 hours. No results for input(s): AMMONIA in the last 168 hours. CBC:  Recent Labs Lab 12/25/14 0552 12/25/14 0709 12/26/14 0608 12/28/14 0445 12/28/14 2158  WBC 10.7* 10.9* 12.6* 11.4* 11.4*  HGB 9.2* 9.7* 10.0* 10.4* 10.6*  HCT 31.1* 31.7* 34.6* 33.2* 34.5*  MCV 83.2 82.6 83.8 81.4 85.0  PLT 256 245 280 259 298   Cardiac Enzymes: No results for input(s): CKTOTAL, CKMB, CKMBINDEX, TROPONINI in the last 168 hours. BNP: BNP (last 3 results)  Recent Labs  10/12/14 0100   BNP 143.8*    ProBNP (last 3 results) No results for input(s): PROBNP in the last 8760 hours.  CBG:  Recent Labs Lab 12/28/14 1819 12/28/14 1844 12/28/14 1845 12/28/14 1905 12/28/14 2030  GLUCAP 31* 59* 59* 58* 104*       Signed:  Nianna Igo  [facility] 12/28/2014, 10:21 PM

## 2014-12-28 NOTE — Progress Notes (Signed)
Physical Therapy Treatment Patient Details Name: Jordan [name] MRN: [id number] DOB: [birth year] Today's Date: 12/28/2014    History of Present Illness 74 y.o. female with PMH of hypertension, diabetes mellitus, GERD, breast cancer, COPD, arthritis, chronic kidney disease (unknown stage), chronic leg edema, who presents with shortness of breath and syncope. s/p cardiac cath 10/19/14, HD initiated since admission, awaiting permanent AVG. Pt visiting from [place] for 3 weeks where she plans to return to care for sister s/p CVA and her spouse is in a SNF in [place].     PT Comments    More willing to get up and around (still declining hallway amb, so walked back and forth in the room); Hoping to go home tomorrow, and on track for dc from a PT standpoint  Follow Up Recommendations  Home health PT;Supervision for mobility/OOB     Equipment Recommendations  None recommended by PT    Recommendations for Other Services       Precautions / Restrictions Precautions Precautions: Fall Precaution Comments: Fall risk reduced with use of RW    Mobility  Bed Mobility Overal bed mobility: Needs Assistance Bed Mobility: Supine to Sit     Supine to sit: Supervision     General bed mobility comments: used bed rails and noted grimace while getting up   Transfers Overall transfer level: Needs assistance Equipment used: Rolling walker (2 wheeled) Transfers: Sit to/from Stand Sit to Stand: Supervision         General transfer comment: Pt performed sit to stand transfer slow and steady combined with a forward rocking motion prior to standing. Used grab bar for pull up and support standing from los commode  Ambulation/Gait Ambulation/Gait assistance: Supervision Ambulation Distance (Feet): 25 Feet Assistive device: Rolling walker (2 wheeled) Gait Pattern/deviations: Step-through pattern;Decreased step length - right;Decreased step length - left;Trunk flexed     General Gait Details:  Dependent on UE support for steadiness; initially flexed over RW supporting self on elbows; after batroom visit more upright; (pt declining hallway amb -- wants to have shoes on for hallway amb; asked daughter to bring in shoes yesterday)   Stairs            Wheelchair Mobility    Modified Rankin (Stroke Patients Only)       Balance     Sitting balance-Leahy Scale: Good       Standing balance-Leahy Scale: Fair                      Cognition Arousal/Alertness: Awake/alert Behavior During Therapy: WFL for tasks assessed/performed Overall Cognitive Status: Within Functional Limits for tasks assessed                      Exercises      General Comments        Pertinent Vitals/Pain Pain Assessment: 0-10 Pain Score: 6  Pain Location: R Upper arm Pain Descriptors / Indicators: Aching Pain Intervention(s): Monitored during session;Repositioned;Limited activity within patient's tolerance    Home Living                      Prior Function            PT Goals (current goals can now be found in the care plan section) Acute Rehab PT Goals Patient Stated Goal: return home PT Goal Formulation: With patient Time For Goal Achievement: 01/10/15 Potential to Achieve Goals: Good Progress towards PT goals: Progressing toward goals  Frequency  Min 3X/week    PT Plan Current plan remains appropriate    Co-evaluation             End of Session   Activity Tolerance: Patient tolerated treatment well Patient left: in bed;with call bell/phone within reach (sitting EOB)     Time: [id number] PT Time Calculation (min) (ACUTE ONLY): 16 min  Charges:  $Gait Training: 8-22 mins                    G Codes:      Roney Marion Hamff 12/28/2014, 11:04 AM  Roney Marion, PT  Acute Rehabilitation Services Pager [phone] Office [phone]

## 2014-12-28 NOTE — Progress Notes (Signed)
[name] Progress Note  Assessment/Plan: 1. AMS/febrile illness secondary to enterococcal bactermia (pan sens) and Klebsiella UTI. Vanc and Rocephin Dcd changed to Unasyn.- will need to change to Vanc at d/c - TEE neg for veg -  2. ESRD - TTS - catheter was removed 9/10, AVF didn't run well- had infiltration IR to place new TDC before next HD; not clear if some of the problem is depth, her large fatty arms or tortuousness of AVF. Plan to rest AVF. HD today after access placement- will need outpt f'gram to evaluate access  3. Hypertension/volume - BP here variable but overall controlled. Given morbid obesity, left lymphedema, it is difficult to assess volume status; on coreg 6.25 bid at home - under edw. 4. Anemia - Hgb 10.4 -stable- Mircera dosed 9/6 5. Metabolic bone disease - phos 4.9 Continue calcitriol; not on binder or sensipar 6. Nutrition - morbidly obese. EDW lowered some since starting HD, but most decrease likely to volume removal. Renal diet. alb 2.6- she will had dinner before coming to HD tonight - d/w her RN 7. Hx breast cancer s/p left mastectomy - chronic left arm lymphedema 8. NICM EF 30%, recent NSTEMI low dose BB 9. DM 10. HX OSA - referred to pul 8/24 by PCP- has used CPAP in the past  Jordan Jacobson, PA-C [name] [phone] 12/28/2014,4:32 PM  LOS: 5 days   Renal Attending; I agree with the note as articulated above.  Pt seen and evaluated independently. [name] C  Subjective:  Hungry - been NPO all day - just now leaving to get catheter placed   Objective Filed Vitals:   12/27/14 1530 12/27/14 1535 12/27/14 2134 12/28/14 0541  BP: 143/43  99/42 97/44  Pulse:   65 63  Temp:   97.9 F (36.6 C) 97.6 F (36.4 C)  TempSrc:   Oral Oral  Resp: 17  18 17   Height:      Weight:   126.2 kg (278 lb 3.5 oz)   SpO2: 100% 99% 100% 98%   Physical Exam General: NAD Heart: RRR  Lungs: no rales Abdomen: soft obese   Extremities:++ LE edema, tender to touch Dialysis Access: right AVF + bruit/bruising  Dialysis Orders: [facility] 4.25 180 400/800 EDW 123 3 K 2.5 Ca profile 2 heparin 13,000 venofer 100 through 9/20 Mircer 100 q 2 weeks last given 9/6 calcitriol 0.75 Recent: Hgb 10.8 9/1 13% sat iPH 327 have been using 1 needle in AVF The past 3 treatments at Qb 250   Additional Objective Labs: Basic Metabolic Panel:  Recent Labs Lab 12/24/14 0710  12/25/14 0709 12/26/14 0608 12/28/14 0445  NA 136  < > 134* 136 133*  K 3.7  < > 3.6 3.8 4.0  CL 98*  < > 97* 98* 97*  CO2 28  < > 26 26 24   GLUCOSE 164*  < > 98 102* 131*  BUN 30*  < > 39* 24* 43*  CREATININE 6.05*  < > 7.79* 6.43* 9.29*  CALCIUM 8.6*  < > 8.6* 8.8* 8.4*  PHOS 4.1  --  4.9*  --   --   < > = values in this interval not displayed. Liver Function Tests:  Recent Labs Lab 12/24/14 0710 12/25/14 0709  ALBUMIN 2.8* 2.6*  CBC:  Recent Labs Lab 12/24/14 0710 12/25/14 0552 12/25/14 0709 12/26/14 0608 12/28/14 0445  WBC 10.8* 10.7* 10.9* 12.6* 11.4*  HGB 9.9* 9.2* 9.7* 10.0* 10.4*  HCT 32.8* 31.1*  31.7* 34.6* 33.2*  MCV 82.4 83.2 82.6 83.8 81.4  PLT 245 256 245 280 259   Blood Culture    Component Value Date/Time   SDES BLOOD LEFT WRIST 12/24/2014 1525   SPECREQUEST BOTTLES DRAWN AEROBIC AND ANAEROBIC 5CC 12/24/2014 1525   CULT NO GROWTH 4 DAYS 12/24/2014 1525   REPTSTATUS PENDING 12/24/2014 1525   CBG:  Recent Labs Lab 12/27/14 1114 12/27/14 1632 12/27/14 2133 12/28/14 0745 12/28/14 1213  GLUCAP 173* 160* 173* 136* 122*   Medications:   . ampicillin-sulbactam (UNASYN) IV  3 g Intravenous Q24H  . calcitRIOL  0.75 mcg Oral Q T,Th,Sa-HD  . carvedilol  6.25 mg Oral BID WC  .  ceFAZolin (ANCEF) IV  3 g Intravenous to XRAY  . gabapentin  300 mg Oral QHS  . heparin  5,000 Units Subcutaneous 3 times per day  . insulin aspart  0-9 Units Subcutaneous TID WC  . insulin detemir  70 Units Subcutaneous Q24H  .  sodium chloride  3 mL Intravenous Q12H

## 2014-12-28 NOTE — Progress Notes (Signed)
[facility] PROGRESS NOTE  Jordan [name] [id number] DOB: 03-04-1941 DOA: 12/23/2014 PCP: [name], MD   Narrative: Jordan [name] is a 74 y.o. female with history of diabetes mellitus , insulin-dependent , CHF,ESRD on HD TTS, history of breast cancer with left upper extremity lymphedema , hypertension, gastroesophageal reflux disease who presented to the ED with confusion fever, speech disturbance, chills and vomiting. Found to have Enterococcal bacteremia, felt to be HD cath related, HD catheter removed Had TEE which was negative, ID and Renal following Plan for new HD cath placement today  Assessment/Plan: 1. Enterococcal  Sepsis  -improving -Blood cultures with Enterococcus suspect right chest HD catheter was the source -Vanc changed to IV Unasyn 9/10, ID consult appreciated -HD catheter removed 9/10 -repeat Blood Cx NGTD -TEE negative for endocarditis -for new HD access today -d/w Dr.Hatcher, she can be discharged home on 2 weeks of IV Vancomycin that can be administered with HD  2. Metabolic encephalopathy -Due to sepsis, mentation back to baseline, no focal signs, CT head and unremarkable  3. ESRD - -has an AV fistula in the right arm which was placed in July, not mature yet,  HD catheter removed 9/10 -Will need new dialysis catheter placed today -Per Renal  4. Hypertension/volume -  -Stable, on coreg   5. Anemia of chronic disease and iron deficiency -Stable, continue Aranesp  6. History of breast cancer status post left mastectomy and chronic left arm lymphedema  7. History of chronic systolic CHF/NICM, EF [id number],  -volume managed with HD -Continue Coreg  8. Diabetes mellitus -Stable continue Levemir, sliding scale insulin will follow HbA1c  9. OSA - referred to pul 8/24 by PCP- has used CPAP in the past  10. Klebsiella UTI  -Now on Unasyn to cover both urine and Enterococcus in blood -completed 6/7 of Abx for this  Morbid  obesity  DVT prophylaxis : Heparin subcutaneous    Code Status: Full Code Family Communication: none at bedside Disposition Plan: Home when improved   Consultants:  Renal  HPI/Subjective: Feels well, NPo for HD catheter  Objective: Filed Vitals:   12/28/14 0541  BP: 97/44  Pulse: 63  Temp: 97.6 F (36.4 C)  Resp: 17    Intake/Output Summary (Last 24 hours) at 12/28/14 1452 Last data filed at 12/28/14 0634  Gross per 24 hour  Intake    580 ml  Output      0 ml  Net    580 ml   Filed Weights   12/26/14 2116 12/27/14 0457 12/27/14 2134  Weight: 124.9 kg (275 lb 5.7 oz) 125.6 kg (276 lb 14.4 oz) 126.2 kg (278 lb 3.5 oz)    Exam:   General:  AAOx3, obese, pleasant  Cardiovascular: S1S2/RRR  HEENT: dressing at site of prior HD cath  Respiratory: CTAB  Abdomen: soft, NT, BSpresent  Musculoskeletal: no edema c/c   Data Reviewed: Basic Metabolic Panel:  Recent Labs Lab 12/23/14 0900  12/24/14 0710 12/25/14 0552 12/25/14 0709 12/26/14 0608 12/28/14 0445  NA 134*  --  136 135 134* 136 133*  K 4.7  --  3.7 3.6 3.6 3.8 4.0  CL 97*  --  98* 97* 97* 98* 97*  CO2 24  --  28 26 26 26 24   GLUCOSE 235*  --  164* 91 98 102* 131*  BUN 62*  --  30* 38* 39* 24* 43*  CREATININE 9.26*  < > 6.05* 7.75* 7.79* 6.43* 9.29*  CALCIUM 8.9  --  8.6* 8.5* 8.6* 8.8* 8.4*  MG 2.0  --   --   --   --   --   --   PHOS  --   --  4.1  --  4.9*  --   --   < > = values in this interval not displayed. Liver Function Tests:  Recent Labs Lab 12/24/14 0710 12/25/14 0709  ALBUMIN 2.8* 2.6*   No results for input(s): LIPASE, AMYLASE in the last 168 hours. No results for input(s): AMMONIA in the last 168 hours. CBC:  Recent Labs Lab 12/24/14 0710 12/25/14 0552 12/25/14 0709 12/26/14 0608 12/28/14 0445  WBC 10.8* 10.7* 10.9* 12.6* 11.4*  HGB 9.9* 9.2* 9.7* 10.0* 10.4*  HCT 32.8* 31.1* 31.7* 34.6* 33.2*  MCV 82.4 83.2 82.6 83.8 81.4  PLT 245 256 245 280 259   Cardiac  Enzymes: No results for input(s): CKTOTAL, CKMB, CKMBINDEX, TROPONINI in the last 168 hours. BNP (last 3 results)  Recent Labs  10/12/14 0100  BNP 143.8*    ProBNP (last 3 results) No results for input(s): PROBNP in the last 8760 hours.  CBG:  Recent Labs Lab 12/27/14 1114 12/27/14 1632 12/27/14 2133 12/28/14 0745 12/28/14 1213  GLUCAP 173* 160* 173* 136* 122*    Recent Results (from the past 240 hour(s))  Urine culture     Status: None   Collection Time: 12/23/14  9:41 AM  Result Value Ref Range Status   Specimen Description URINE, CATHETERIZED  Final   Special Requests Normal  Final   Culture >=100,000 COLONIES/mL KLEBSIELLA PNEUMONIAE  Final   Report Status 12/25/2014 FINAL  Final   Organism ID, Bacteria KLEBSIELLA PNEUMONIAE  Final      Susceptibility   Klebsiella pneumoniae - MIC*    AMPICILLIN >=32 RESISTANT Resistant     CEFAZOLIN <=4 SENSITIVE Sensitive     CEFTRIAXONE <=1 SENSITIVE Sensitive     CIPROFLOXACIN <=0.25 SENSITIVE Sensitive     GENTAMICIN <=1 SENSITIVE Sensitive     IMIPENEM <=0.25 SENSITIVE Sensitive     NITROFURANTOIN 64 INTERMEDIATE Intermediate     TRIMETH/SULFA <=20 SENSITIVE Sensitive     AMPICILLIN/SULBACTAM 8 SENSITIVE Sensitive     PIP/TAZO <=4 SENSITIVE Sensitive     * >=100,000 COLONIES/mL KLEBSIELLA PNEUMONIAE  Culture, blood (routine x 2)     Status: None   Collection Time: 12/23/14 10:05 AM  Result Value Ref Range Status   Specimen Description BLOOD LEFT ANTECUBITAL  Final   Special Requests BOTTLES DRAWN AEROBIC AND ANAEROBIC 5CC  Final   Culture  Setup Time   Final    GRAM POSITIVE COCCI IN CLUSTERS IN BOTH AEROBIC AND ANAEROBIC BOTTLES CRITICAL RESULT CALLED TO, READ BACK BY AND VERIFIED WITH: M [email or web address]@[id number]  12/24/14 MKELLY    Culture ENTEROCOCCUS SPECIES  Final   Report Status 12/25/2014 FINAL  Final   Organism ID, Bacteria ENTEROCOCCUS SPECIES  Final      Susceptibility   Enterococcus species - MIC*    AMPICILLIN <=2  SENSITIVE Sensitive     VANCOMYCIN 2 SENSITIVE Sensitive     GENTAMICIN SYNERGY SENSITIVE Sensitive     LINEZOLID 2 SENSITIVE Sensitive     * ENTEROCOCCUS SPECIES  Culture, blood (routine x 2)     Status: None   Collection Time: 12/23/14 10:55 AM  Result Value Ref Range Status   Specimen Description BLOOD LEFT HAND  Final   Special Requests BOTTLES DRAWN AEROBIC ONLY 3CC  Final   Culture  Setup Time  Final    GRAM POSITIVE COCCI IN CLUSTERS AEROBIC BOTTLE ONLY CRITICAL RESULT CALLED TO, READ BACK BY AND VERIFIED WITH: T [id number]  12/24/14 MKELLY    Culture   Final    ENTEROCOCCUS SPECIES SUSCEPTIBILITIES PERFORMED ON PREVIOUS CULTURE WITHIN THE LAST 5 DAYS.    Report Status 12/25/2014 FINAL  Final  Culture, blood (routine x 2)     Status: None (Preliminary result)   Collection Time: 12/24/14  3:03 PM  Result Value Ref Range Status   Specimen Description BLOOD LEFT HAND  Final   Special Requests BOTTLES DRAWN AEROBIC AND ANAEROBIC 5CC  Final   Culture NO GROWTH 4 DAYS  Final   Report Status PENDING  Incomplete  Culture, blood (routine x 2)     Status: None (Preliminary result)   Collection Time: 12/24/14  3:25 PM  Result Value Ref Range Status   Specimen Description BLOOD LEFT WRIST  Final   Special Requests BOTTLES DRAWN AEROBIC AND ANAEROBIC 5CC  Final   Culture NO GROWTH 4 DAYS  Final   Report Status PENDING  Incomplete  MRSA PCR Screening     Status: None   Collection Time: 12/27/14  7:07 PM  Result Value Ref Range Status   MRSA by PCR NEGATIVE NEGATIVE Final    Comment:        The GeneXpert MRSA Assay (FDA approved for NASAL specimens only), is one component of a comprehensive MRSA colonization surveillance program. It is not intended to diagnose MRSA infection nor to guide or monitor treatment for MRSA infections.      Studies: No results found.  Scheduled Meds: . ampicillin-sulbactam (UNASYN) IV  3 g Intravenous Q24H  . calcitRIOL  0.75 mcg Oral Q  T,Th,Sa-HD  . carvedilol  6.25 mg Oral BID WC  .  ceFAZolin (ANCEF) IV  3 g Intravenous to XRAY  . gabapentin  300 mg Oral QHS  . heparin  5,000 Units Subcutaneous 3 times per day  . insulin aspart  0-9 Units Subcutaneous TID WC  . insulin detemir  70 Units Subcutaneous Q24H  . sodium chloride  3 mL Intravenous Q12H   Continuous Infusions:   Antibiotics Given (last 72 hours)    Date/Time Action Medication Dose Rate   12/25/14 1713 Given   Ampicillin-Sulbactam (UNASYN) 3 g in sodium chloride 0.9 % 100 mL IVPB 3 g 100 mL/hr   12/26/14 1748 Given   Ampicillin-Sulbactam (UNASYN) 3 g in sodium chloride 0.9 % 100 mL IVPB 3 g 100 mL/hr   12/27/14 1647 Given   Ampicillin-Sulbactam (UNASYN) 3 g in sodium chloride 0.9 % 100 mL IVPB 3 g 100 mL/hr      Principal Problem:   Acute encephalopathy Active Problems:   Breast cancer   Hypertension   COPD (chronic obstructive pulmonary disease)   Essential hypertension   ESRD (end stage renal disease) on dialysis   Type 2 diabetes mellitus with diabetic nephropathy   Nonischemic cardiomyopathy   Morbidly obese   OSA (obstructive sleep apnea)   Constipation   UTI (lower urinary tract infection)   Systolic CHF, chronic   Encephalopathy, metabolic   Enterococcal bacteremia   Bacteremia    Time spent: 90min    Kevante Lunt  [facility] Pager [phone]. If 7PM-7AM, please contact night-coverage at [email or web address], password [facility] 12/28/2014, 2:52 PM  LOS: 5 days

## 2014-12-28 NOTE — Procedures (Signed)
Successful placement of tunneled R EJ approach HD catheter with tips terminating within the superior aspect of the right atrium.   The patient tolerated the procedure well without immediate post procedural complication.  The catheter is ready for immediate use.   Ronny Bacon, MD Pager #: [phone]

## 2014-12-29 ENCOUNTER — Ambulatory Visit: Payer: Medicare (Managed Care) | Admitting: Oncology

## 2014-12-29 ENCOUNTER — Encounter (HOSPITAL_COMMUNITY): Payer: Self-pay | Admitting: Cardiovascular Disease

## 2014-12-29 LAB — CULTURE, BLOOD (ROUTINE X 2)
CULTURE: NO GROWTH
Culture: NO GROWTH

## 2014-12-29 LAB — GLUCOSE, CAPILLARY
GLUCOSE-CAPILLARY: 146 mg/dL — AB (ref 65–99)
Glucose-Capillary: 133 mg/dL — ABNORMAL HIGH (ref 65–99)
Glucose-Capillary: 198 mg/dL — ABNORMAL HIGH (ref 65–99)

## 2014-12-29 MED ORDER — VANCOMYCIN HCL 500 MG IV SOLR: INTRAVENOUS | Status: DC

## 2014-12-29 MED ORDER — INSULIN DETEMIR 100 UNIT/ML ~~LOC~~ SOLN
55.0000 [IU] | SUBCUTANEOUS | Status: DC
  Filled 2014-12-29: qty 0.55

## 2014-12-29 MED ORDER — INSULIN DETEMIR 100 UNIT/ML FLEXPEN: 55.0000 [IU] | PEN_INJECTOR | SUBCUTANEOUS | Status: DC

## 2014-12-29 MED ORDER — CALCIUM ACETATE (PHOS BINDER) 667 MG PO CAPS
667.0000 mg | ORAL_CAPSULE | Freq: Three times a day (TID) | ORAL | Status: DC
  Administered 2014-12-29: 667 mg via ORAL
  Filled 2014-12-29: qty 1

## 2014-12-29 NOTE — Discharge Summary (Signed)
Physician Discharge Summary  Nirvi Boehler [name] OMV:[id number] DOB: [birth year] DOA: 12/23/2014  PCP: [name], MD  Admit date: 12/23/2014 Discharge date: 12/29/2014  Time spent:45 minutes Significant updates since discharge summary dictated on 9/13, discharge was held giving hypoglycemia, and a catheter replaced by IR did not function well yesterday, hypoglycemia resolved, patient was dialyzed today with good functioning catheter.  Recommendations for Outpatient Follow-up:  1. Continue IV Vancomcyin for 2 weeks with Dialysis  Discharge Diagnoses:  Principal Problem:   Acute Metabolic encephalopathy   Enterococcal bacteremia   Breast cancer   Hypertension   COPD (chronic obstructive pulmonary disease)   Essential hypertension   ESRD (end stage renal disease) on dialysis   Type 2 diabetes mellitus with diabetic nephropathy   Nonischemic cardiomyopathy   Morbidly obese   OSA (obstructive sleep apnea)   Constipation   UTI (lower urinary tract infection)   Systolic CHF, chronic   Encephalopathy, metabolic   Enterococcal bacteremia   Bacteremia   Discharge Condition: stable  Diet recommendation: Renal Diabetic  Filed Weights   12/28/14 2040 12/29/14 0843 12/29/14 1243  Weight: 123.5 kg (272 lb 4.3 oz) 125 kg (275 lb 9.2 oz) 123 kg (271 lb 2.7 oz)    History of present illness:  Chief Complaint: Confusion/slurred speech HPI: Jordan [name] is a 74 y.o. femalewith history of diabetes mellitus , insulin-dependent , CHF, End-stage renal disease on hemodialysis Tuesdays and Thursdays and Saturdays, history of breast cancer with left upper extremity lymphedema , hypertension, gastroesophageal reflux disease presented to the ED with confusion, fever.  Hospital Course:  1. Enterococcal Sepsis -improved -Blood cultures on admission grew Enterococcus suspect right chest HD catheter was the source -Was started on IV Vanc on admission, Vanc changed to IV Unasyn  9/10, ID consult by Dr.Hatcher appreciated -HD catheter removed 9/10 -repeat Blood Cx NGTD -TEE negative for endocarditis -new HD catheter placed today -d/w Dr.Hatcher, she can be discharged home on 2 weeks of IV Vancomycin that can be administered with HD  2. Metabolic encephalopathy -Due to sepsis, mentation back to baseline, no focal signs, CT head and unremarkable  3. ESRD - -has an AV fistula in the right arm which was placed in July, not mature yet, HD catheter removed 9/10 -New dialysis catheter placed 9/13 -Dialyzed per Renal  4. Hypertension/volume -  -Stable, on coreg   5. Anemia of chronic disease and iron deficiency -Stable, continue Aranesp  6. History of breast cancer status post left mastectomy and chronic left arm lymphedema  7. History of chronic systolic CHF/NICM, EF 62%,  -volume managed with HD -Continue Coreg  8. Diabetes mellitus - Patient with an episode of hypoglycemia yesterday , Levemir was decreased from 70 units daily to 55 units , continue with insulin sliding scale   9. OSA - referred to pul 8/24 by PCP- has used CPAP in the past  10. Klebsiella UTI  -Now on Unasyn to cover both urine and Enterococcus in blood -completed 6/7 of Abx for this  Procedures:  New R EJ HD catheter placed 9/13  TEE: negative for endocardiis  Old Chest HD catheter removed  Discharge Exam: Filed Vitals:   12/29/14 1243  BP: 126/40  Pulse: 72  Temp: 98 F (36.7 C)  Resp:     General: AAOx3 Cardiovascular: S!S2/RRR Respiratory: CTAB  Discharge Instructions   Discharge Instructions    Increase activity slowly    Complete by:  As directed  Current Discharge Medication List    START taking these medications   Details  vancomycin in sodium chloride 0.9 % 100 mL Patient will need IV vancomycin during hemodialysis for enterococcus bacteremia for next 2 weeks start date 12/29/2014, this is to be dosed and manage by nephrology at  hemodialysis center      CONTINUE these medications which have CHANGED   Details  gabapentin (NEURONTIN) 300 MG capsule Take 1 capsule (300 mg total) by mouth at bedtime. Qty: 60 capsule, Refills: 6    Insulin Detemir (LEVEMIR FLEXPEN) 100 UNIT/ML Pen Inject 55 Units into the skin every morning. Qty: 15 mL, Refills: 11      CONTINUE these medications which have NOT CHANGED   Details  albuterol (PROVENTIL HFA;VENTOLIN HFA) 108 (90 BASE) MCG/ACT inhaler Inhale 1-2 puffs into the lungs every 6 (six) hours as needed for wheezing or shortness of breath.    carvedilol (COREG) 6.25 MG tablet Take 1 tablet (6.25 mg total) by mouth 2 (two) times daily with a meal. Qty: 60 tablet, Refills: 1    HYDROcodone-acetaminophen (NORCO/VICODIN) 5-325 MG per tablet Take 1 tablet by mouth every 6 (six) hours as needed for moderate pain.    insulin lispro (HUMALOG KWIKPEN) 100 UNIT/ML KiwkPen Inject 1-5 Units into the skin 3 (three) times daily as needed (blood sugar).     Multiple Vitamins-Minerals (CENTRUM SILVER ADULT 50+ PO) Take 1 tablet by mouth daily.       No Known Allergies Follow-up Information    Follow up with [name], MD. Schedule an appointment as soon as possible for a visit in 1 week.   Specialty:  Family Medicine   Contact information:   [address] [place] [address] [phone]        The results of significant diagnostics from this hospitalization (including imaging, microbiology, ancillary and laboratory) are listed below for reference.    Significant Diagnostic Studies: Ct Head Wo Contrast  12/23/2014   CLINICAL DATA:  Left facial droop, generalized weakness and slurred speech.  EXAM: CT HEAD WITHOUT CONTRAST  TECHNIQUE: Contiguous axial images were obtained from the base of the skull through the vertex without intravenous contrast.  COMPARISON:  None.  FINDINGS: The brain appears normal without hemorrhage, infarct, mass lesion, mass effect,  midline shift or abnormal extra-axial fluid collection. No hydrocephalus or pneumocephalus. The calvarium is intact. Imaged paranasal sinuses and mastoid air cells are clear.  IMPRESSION: Negative exam.  These results were called by telephone at the time of interpretation on 12/23/2014 at 8:58 am to Dr. Janann Colonel, who verbally acknowledged these results.   Electronically Signed   By: Inge Rise M.D.   On: 12/23/2014 09:02   Ir Fluoro Guide Cv Line Right  12/28/2014   INDICATION: End-stage renal disease. In need of durable intravenous access for continuation of hemodialysis.  Patient with history of prior right internal jugular approach dialysis catheter placement on 10/21/2014 however the catheter was removed on 12/25/2014 given concern for infection. Patient is currently afebrile with normalization of white count.  EXAM: TUNNELED CENTRAL VENOUS HEMODIALYSIS CATHETER PLACEMENT WITH ULTRASOUND AND FLUOROSCOPIC GUIDANCE  MEDICATIONS: Ancef 3 gm IV; The IV antibiotic was given in an appropriate time interval prior to skin puncture.  CONTRAST:  None  ANESTHESIA/SEDATION: Versed 1.5 mg IV; Fentanyl 75 mcg IV  Total Moderate Sedation Time  15 minutes.  FLUOROSCOPY TIME:  48 seconds (53.2 mGy)  COMPLICATIONS: None immediate  PROCEDURE: Informed written consent was obtained from the  patient after a discussion of the risks, benefits, and alternatives to treatment. Questions regarding the procedure were encouraged and answered.  Ultrasound scanning demonstrates hypoechoic occlusive thrombus within the diminutive right internal jugular vein, likely the sequela of recent dialysis catheter placement and subsequent removal. Note was made of a sizable adjacent right external jugular vein which was targeted for dialysis catheter placement given the patient's history of left-sided breast cancer and left upper extremity lymphedema.  The right neck and chest were prepped with chlorhexidine in a sterile fashion, and a sterile drape  was applied covering the operative field. Maximum barrier sterile technique with sterile gowns and gloves were used for the procedure. A timeout was performed prior to the initiation of the procedure.  After creating a small venotomy incision, a micropuncture kit was utilized to access the right external jugular vein under direct, real-time ultrasound guidance after the overlying soft tissues were anesthetized with 1% lidocaine with epinephrine. Ultrasound image documentation was performed. The microwire was kinked to measure appropriate catheter length. A stiff Glidewire was advanced to the level of the IVC and the micropuncture sheath was exchanged for a peel-away sheath. A Palindrome tunneled hemodialysis catheter measuring 19 cm from tip to cuff was tunneled in a retrograde fashion from the anterior chest wall to the venotomy incision.  The catheter was then placed through the peel-away sheath with tips ultimately positioned within the superior aspect of the right atrium. Final catheter positioning was confirmed and documented with a spot radiographic image. The catheter aspirates and flushes normally. The catheter was flushed with appropriate volume heparin dwells.  The catheter exit site was secured with a 0-Prolene retention suture. The venotomy incision was closed with an interrupted 4-0 Vicryl, Dermabond and Steri-strips. Dressings were applied. The patient tolerated the procedure well without immediate post procedural complication.  IMPRESSION: 1. Successful placement of 19 cm tip to cuff tunneled hemodialysis catheter via the right external jugular vein with tips terminating within the superior aspect of the right atrium. The catheter is ready for immediate use. 2. Occlusion of the right internal jugular vein, likely the sequela of recent dialysis catheter placement and subsequent removal.   Electronically Signed   By: Sandi Mariscal M.D.   On: 12/28/2014 18:02   Ir Removal Tun Cv Cath W/o Fl  12/25/2014    INDICATION: 74 year old female with a history of renal failure and a right-sided IJ central catheter placed October 21, 2014. She presents for removal given bacteremia. She will undergo a line holiday with placement of new catheter next week.  EXAM: TUNNELED CENTRAL VENOUS HEMODIALYSIS CATHETER REMOVAL  MEDICATIONS: NONE  CONTRAST:  None  ANESTHESIA/SEDATION: None  FLUOROSCOPY TIME:  None  COMPLICATIONS: None  PROCEDURE: Informed written consent was obtained from the patient after a discussion of the risks, benefits, and alternatives to treatment. Questions regarding the procedure were encouraged and answered. The right neck and chest, including the indwelling line were prepped with chlorhexidine in a sterile fashion, and a sterile drape was applied covering the operative field. Maximum barrier sterile technique with sterile gowns and gloves were used for the procedure. A timeout was performed prior to the initiation of the procedure.  1% lidocaine was used for local anesthesia.  Using blunt and sharp dissection, the tunneled catheter was removed in its entirety. A sterile dressing was placed.  The patient tolerated the procedure well and remained hemodynamically stable throughout.  No complications encountered and no significant blood loss encountered.  IMPRESSION: Status post bedside removal of  tunneled HD catheter for a line holiday.  Signed,  Dulcy Fanny. Earleen Newport, DO  [facility]  [facility]   Electronically Signed   By: Corrie Mckusick D.O.   On: 12/25/2014 14:09   Ir US Guide Vasc Access Right  12/28/2014   INDICATION: End-stage renal disease. In need of durable intravenous access for continuation of hemodialysis.  Patient with history of prior right internal jugular approach dialysis catheter placement on 10/21/2014 however the catheter was removed on 12/25/2014 given concern for infection. Patient is currently afebrile with normalization of white count.  EXAM:  TUNNELED CENTRAL VENOUS HEMODIALYSIS CATHETER PLACEMENT WITH ULTRASOUND AND FLUOROSCOPIC GUIDANCE  MEDICATIONS: Ancef 3 gm IV; The IV antibiotic was given in an appropriate time interval prior to skin puncture.  CONTRAST:  None  ANESTHESIA/SEDATION: Versed 1.5 mg IV; Fentanyl 75 mcg IV  Total Moderate Sedation Time  15 minutes.  FLUOROSCOPY TIME:  48 seconds (24.2 mGy)  COMPLICATIONS: None immediate  PROCEDURE: Informed written consent was obtained from the patient after a discussion of the risks, benefits, and alternatives to treatment. Questions regarding the procedure were encouraged and answered.  Ultrasound scanning demonstrates hypoechoic occlusive thrombus within the diminutive right internal jugular vein, likely the sequela of recent dialysis catheter placement and subsequent removal. Note was made of a sizable adjacent right external jugular vein which was targeted for dialysis catheter placement given the patient's history of left-sided breast cancer and left upper extremity lymphedema.  The right neck and chest were prepped with chlorhexidine in a sterile fashion, and a sterile drape was applied covering the operative field. Maximum barrier sterile technique with sterile gowns and gloves were used for the procedure. A timeout was performed prior to the initiation of the procedure.  After creating a small venotomy incision, a micropuncture kit was utilized to access the right external jugular vein under direct, real-time ultrasound guidance after the overlying soft tissues were anesthetized with 1% lidocaine with epinephrine. Ultrasound image documentation was performed. The microwire was kinked to measure appropriate catheter length. A stiff Glidewire was advanced to the level of the IVC and the micropuncture sheath was exchanged for a peel-away sheath. A Palindrome tunneled hemodialysis catheter measuring 19 cm from tip to cuff was tunneled in a retrograde fashion from the anterior chest wall to the  venotomy incision.  The catheter was then placed through the peel-away sheath with tips ultimately positioned within the superior aspect of the right atrium. Final catheter positioning was confirmed and documented with a spot radiographic image. The catheter aspirates and flushes normally. The catheter was flushed with appropriate volume heparin dwells.  The catheter exit site was secured with a 0-Prolene retention suture. The venotomy incision was closed with an interrupted 4-0 Vicryl, Dermabond and Steri-strips. Dressings were applied. The patient tolerated the procedure well without immediate post procedural complication.  IMPRESSION: 1. Successful placement of 19 cm tip to cuff tunneled hemodialysis catheter via the right external jugular vein with tips terminating within the superior aspect of the right atrium. The catheter is ready for immediate use. 2. Occlusion of the right internal jugular vein, likely the sequela of recent dialysis catheter placement and subsequent removal.   Electronically Signed   By: Sandi Mariscal M.D.   On: 12/28/2014 18:02   Dg Chest Port 1 View  12/23/2014   CLINICAL DATA:  Fever.  EXAM: PORTABLE CHEST - 1 VIEW  COMPARISON:  10/15/2014  FINDINGS: There has been an interval insertion of dual lumen central  venous catheter from right internal jugular approach, tip overlying expected location of the right atrium.  Cardiomediastinal silhouette is stably enlarged.  There is no evidence of pneumothorax. Left hemidiaphragm is obscured which may be due to pleural effusion and atelectasis or airspace consolidation. Previously noted widespread airspace opacities have improved.  Osseous structures are without acute abnormality. Soft tissues are grossly normal.  IMPRESSION: Overall improved aeration of the lungs, with residual left lower lung opacity, which may represent pleural effusion with atelectasis, or focal airspace consolidation.  Stable cardiomegaly.   Electronically Signed   By:  Fidela Salisbury M.D.   On: 12/23/2014 09:34    Microbiology: Recent Results (from the past 240 hour(s))  Urine culture     Status: None   Collection Time: 12/23/14  9:41 AM  Result Value Ref Range Status   Specimen Description URINE, CATHETERIZED  Final   Special Requests Normal  Final   Culture >=100,000 COLONIES/mL KLEBSIELLA PNEUMONIAE  Final   Report Status 12/25/2014 FINAL  Final   Organism ID, Bacteria KLEBSIELLA PNEUMONIAE  Final      Susceptibility   Klebsiella pneumoniae - MIC*    AMPICILLIN >=32 RESISTANT Resistant     CEFAZOLIN <=4 SENSITIVE Sensitive     CEFTRIAXONE <=1 SENSITIVE Sensitive     CIPROFLOXACIN <=0.25 SENSITIVE Sensitive     GENTAMICIN <=1 SENSITIVE Sensitive     IMIPENEM <=0.25 SENSITIVE Sensitive     NITROFURANTOIN 64 INTERMEDIATE Intermediate     TRIMETH/SULFA <=20 SENSITIVE Sensitive     AMPICILLIN/SULBACTAM 8 SENSITIVE Sensitive     PIP/TAZO <=4 SENSITIVE Sensitive     * >=100,000 COLONIES/mL KLEBSIELLA PNEUMONIAE  Culture, blood (routine x 2)     Status: None   Collection Time: 12/23/14 10:05 AM  Result Value Ref Range Status   Specimen Description BLOOD LEFT ANTECUBITAL  Final   Special Requests BOTTLES DRAWN AEROBIC AND ANAEROBIC 5CC  Final   Culture  Setup Time   Final    GRAM POSITIVE COCCI IN CLUSTERS IN BOTH AEROBIC AND ANAEROBIC BOTTLES CRITICAL RESULT CALLED TO, READ BACK BY AND VERIFIED WITH: M [email or web address]  12/24/14 MKELLY    Culture ENTEROCOCCUS SPECIES  Final   Report Status 12/25/2014 FINAL  Final   Organism ID, Bacteria ENTEROCOCCUS SPECIES  Final      Susceptibility   Enterococcus species - MIC*    AMPICILLIN <=2 SENSITIVE Sensitive     VANCOMYCIN 2 SENSITIVE Sensitive     GENTAMICIN SYNERGY SENSITIVE Sensitive     LINEZOLID 2 SENSITIVE Sensitive     * ENTEROCOCCUS SPECIES  Culture, blood (routine x 2)     Status: None   Collection Time: 12/23/14 10:55 AM  Result Value Ref Range Status   Specimen Description BLOOD LEFT  HAND  Final   Special Requests BOTTLES DRAWN AEROBIC ONLY 3CC  Final   Culture  Setup Time   Final    GRAM POSITIVE COCCI IN CLUSTERS AEROBIC BOTTLE ONLY CRITICAL RESULT CALLED TO, READ BACK BY AND VERIFIED WITH: T [email or web address]$  12/24/14 MKELLY    Culture   Final    ENTEROCOCCUS SPECIES SUSCEPTIBILITIES PERFORMED ON PREVIOUS CULTURE WITHIN THE LAST 5 DAYS.    Report Status 12/25/2014 FINAL  Final  Culture, blood (routine x 2)     Status: None (Preliminary result)   Collection Time: 12/24/14  3:03 PM  Result Value Ref Range Status   Specimen Description BLOOD LEFT HAND  Final   Special Requests BOTTLES DRAWN AEROBIC AND ANAEROBIC 5CC  Final   Culture NO GROWTH 4 DAYS  Final   Report Status PENDING  Incomplete  Culture, blood (routine x 2)     Status: None (Preliminary result)   Collection Time: 12/24/14  3:25 PM  Result Value Ref Range Status   Specimen Description BLOOD LEFT WRIST  Final   Special Requests BOTTLES DRAWN AEROBIC AND ANAEROBIC 5CC  Final   Culture NO GROWTH 4 DAYS  Final   Report Status PENDING  Incomplete  MRSA PCR Screening     Status: None   Collection Time: 12/27/14  7:07 PM  Result Value Ref Range Status   MRSA by PCR NEGATIVE NEGATIVE Final    Comment:        The GeneXpert MRSA Assay (FDA approved for NASAL specimens only), is one component of a comprehensive MRSA colonization surveillance program. It is not intended to diagnose MRSA infection nor to guide or monitor treatment for MRSA infections.      Labs: Basic Metabolic Panel:  Recent Labs Lab 12/23/14 0900  12/24/14 0710 12/25/14 0552 12/25/14 0709 12/26/14 0608 12/28/14 0445 12/28/14 2158  NA 134*  --  136 135 134* 136 133* 133*  K 4.7  --  3.7 3.6 3.6 3.8 4.0 3.9  CL 97*  --  98* 97* 97* 98* 97* 94*  CO2 24  --  28 26 26 26 24 24   GLUCOSE 235*  --  164* 91 98 102* 131* 156*  BUN 62*  --  30* 38* 39* 24* 43* 48*  CREATININE 9.26*  < > 6.05* 7.75* 7.79* 6.43* 9.29* 9.76*  CALCIUM  8.9  --  8.6* 8.5* 8.6* 8.8* 8.4* 8.1*  MG 2.0  --   --   --   --   --   --   --   PHOS  --   --  4.1  --  4.9*  --   --  5.9*  < > = values in this interval not displayed. Liver Function Tests:  Recent Labs Lab 12/24/14 0710 12/25/14 0709 12/28/14 2158  ALBUMIN 2.8* 2.6* 2.8*   No results for input(s): LIPASE, AMYLASE in the last 168 hours. No results for input(s): AMMONIA in the last 168 hours. CBC:  Recent Labs Lab 12/25/14 0552 12/25/14 0709 12/26/14 0608 12/28/14 0445 12/28/14 2158  WBC 10.7* 10.9* 12.6* 11.4* 11.4*  HGB 9.2* 9.7* 10.0* 10.4* 10.6*  HCT 31.1* 31.7* 34.6* 33.2* 34.5*  MCV 83.2 82.6 83.8 81.4 85.0  PLT 256 245 280 259 298   Cardiac Enzymes: No results for input(s): CKTOTAL, CKMB, CKMBINDEX, TROPONINI in the last 168 hours. BNP: BNP (last 3 results)  Recent Labs  10/12/14 0100  BNP 143.8*    ProBNP (last 3 results) No results for input(s): PROBNP in the last 8760 hours.  CBG:  Recent Labs Lab 12/28/14 1845 12/28/14 1905 12/28/14 2030 12/29/14 0002 12/29/14 0746  GLUCAP 59* 58* 104* 133* 146*       Signed:  Song Myre  [facility] 12/29/2014, 2:13 PM

## 2014-12-29 NOTE — Procedures (Signed)
Tolerating HD BFR 400 via new cath. No hemodynamic instability. Has AVF, resting. Zakhari Fogel C

## 2014-12-29 NOTE — Care Management Note (Signed)
Case Management Note  Patient Details  Name: Zianne Schubring Reed-Grier MRN: [id number] Date of Birth: June 11, 1940  Subjective/Objective:         CM following for progression and d/c planning.           Action/Plan: 12/29/2014 Met with pt who wishes to use [facility] for [facility] needs, that agency notified, spoke with Dorina Hoyer who will obtain orders etc thru [employer]. 3:1 commode ordered per pt request, as she has extreme difficulty getting on and off of her commode at home.   Expected Discharge Date:       12/29/2014           Expected Discharge Plan:  Acute to Acute Transfer  In-House Referral:     Discharge planning Services  CM Consult  Post Acute Care Choice:  Resumption of Svcs/PTA Provider Choice offered to:     DME Arranged:  3-N-1 DME Agency:  [place]:  PT [place]:  [place]  Status of Service:  Completed, signed off  Medicare Important Message Given:  Yes-third notification given Date Medicare IM Given:    Medicare IM give by:    Date Additional Medicare IM Given:    Additional Medicare Important Message give by:     If discussed at [place] of Stay Meetings, dates discussed:    Additional Comments:  Adron Bene, RN 12/29/2014, 2:47 PM

## 2014-12-29 NOTE — Progress Notes (Signed)
[facility] Progress Note  Assessment/Plan: 1. AMS/febrile illness secondary to enterococcal bactermia (pan sens) and Klebsiella UTI. Vanc and Rocephin Dcd changed to Unasyn.- will need to change to Vanc at d/c - TEE neg for veg -  2. ESRD - TTS -(off schedule)  catheter was removed 9/102/2 infx; AVF didn't run well- had infiltration; not clear if some of the problem is depth, her large fatty arms or tortuousness of AVF. Plan to rest AVF- and probably needs fistulagram scheduled as an outpt. Catheter replaced yesterday by IR didn't function well.  Cathflo placed overnight and to return for HD first round.  Flows on catheter only 200 with ^^ arterial pressure.  RN working to try to improve flow. 3. Hypertension/volume - BP here variable but overall controlled. Given morbid obesity, left lymphedema, it is difficult to assess volume status; on coreg 6.25 bid at home - under edw.-reassess EDW post HD today; titrate down - pre HD weight today 125.9 (she had gotten to 121 earlier). Given that she is due again for HD tomorrow - will set for 4 L  4. and get the rest tomorrow here or as an outpt 5. Anemia - Hgb 10.4 -stable- Mircera dosed 9/6- due 9/20 6. Metabolic bone disease - up to phos 5.9 Continue calcitriol; start binder phoslo 1 ac tid 7. Nutrition - morbidly obese. EDW lowered some since starting HD, but most decrease likely to volume removal. Renal diet. alb 2.6- she will had dinner before coming to HD tonight - d/w her RN 8. Hx breast cancer s/p left mastectomy - chronic left arm lymphedema 9. NICM EF 30%, recent NSTEMI low dose BB 10. DM 11. HX OSA - referred to pul 8/24 by PCP- has used CPAP in the past 12. Disp - d/c today IF catheter works well  [name], PA-C [place] [phone] 12/29/2014,8:18 AM  LOS: 6 days    Renal Attending; I agree with the note as articulated above. Koden Hunzeker C   Subjective:   Feels better after eating  breakfast  Objective Filed Vitals:   12/28/14 2245 12/28/14 2315 12/29/14 0006 12/29/14 0456  BP: 133/64 133/91 134/49 149/41  Pulse: 64 69 67 60  Temp:   98.5 F (36.9 C) 98.1 F (36.7 C)  TempSrc:   Oral Oral  Resp:   20 18  Height:      Weight:      SpO2:   99% 100%   Physical Exam  BP in left leg General: NAD - HD being initiated. Heart: RRR Lungs: no rales, dim BS Abdomen: obese soft Extremities: + LE edema Dialysis Access: right IJ (9/13) - some oozing on dressing and right upper AVF (resting - ^ bruising)+ bruit  Dialysis Orders: [facility] 4.25 180 400/800 EDW 123 3 K 2.5 Ca profile 2 heparin 13,000 venofer 100 through 9/20 Mircer 100 q 2 weeks last given 9/6 calcitriol 0.75 Recent: Hgb 10.8 9/1 13% sat iPH 327 have been using 1 needle in AVF The past 3 treatments at Qb 250   Additional Objective Labs: Basic Metabolic Panel:  Recent Labs Lab 12/24/14 0710  12/25/14 0709 12/26/14 0608 12/28/14 0445 12/28/14 2158  NA 136  < > 134* 136 133* 133*  K 3.7  < > 3.6 3.8 4.0 3.9  CL 98*  < > 97* 98* 97* 94*  CO2 28  < > _0 GLUCOSE 164*  < > 98 102* 131* 156*  BUN 30*  < >  39* 24* 43* 48*  CREATININE 6.05*  < > 7.79* 6.43* 9.29* 9.76*  CALCIUM 8.6*  < > 8.6* 8.8* 8.4* 8.1*  PHOS 4.1  --  4.9*  --   --  5.9*  < > = values in this interval not displayed. Liver Function Tests:  Recent Labs Lab 12/24/14 0710 12/25/14 0709 12/28/14 2158  ALBUMIN 2.8* 2.6* 2.8*  CBC:  Recent Labs Lab 12/25/14 0552 12/25/14 0709 12/26/14 0608 12/28/14 0445 12/28/14 2158  WBC 10.7* 10.9* 12.6* 11.4* 11.4*  HGB 9.2* 9.7* 10.0* 10.4* 10.6*  HCT 31.1* 31.7* 34.6* 33.2* 34.5*  MCV 83.2 82.6 83.8 81.4 85.0  PLT 256 245 280 259 298   Blood Culture    Component Value Date/Time   SDES BLOOD LEFT WRIST 12/24/2014 1525   SPECREQUEST BOTTLES DRAWN AEROBIC AND ANAEROBIC 5CC 12/24/2014 1525   CULT NO GROWTH 4 DAYS 12/24/2014 1525   REPTSTATUS PENDING 12/24/2014 1525     Cardiac Enzymes: No results for input(s): CKTOTAL, CKMB, CKMBINDEX, TROPONINI in the last 168 hours. CBG:  Recent Labs Lab 12/28/14 1845 12/28/14 1905 12/28/14 2030 12/29/14 0002 12/29/14 0746  GLUCAP 59* 58* 104* 133* 146*   Iron Studies: No results for input(s): IRON, TIBC, TRANSFERRIN, FERRITIN in the last 72 hours. _0 @ Studies/Results: Ir Fluoro Guide Cv Line Right  12/28/2014   INDICATION: End-stage renal disease. In need of durable intravenous access for continuation of hemodialysis.  Patient with history of prior right internal jugular approach dialysis catheter placement on 10/21/2014 however the catheter was removed on 12/25/2014 given concern for infection. Patient is currently afebrile with normalization of white count.  EXAM: TUNNELED CENTRAL VENOUS HEMODIALYSIS CATHETER PLACEMENT WITH ULTRASOUND AND FLUOROSCOPIC GUIDANCE  MEDICATIONS: Ancef 3 gm IV; The IV antibiotic was given in an appropriate time interval prior to skin puncture.  CONTRAST:  None  ANESTHESIA/SEDATION: Versed 1.5 mg IV; Fentanyl 75 mcg IV  Total Moderate Sedation Time  15 minutes.  FLUOROSCOPY TIME:  48 seconds (12.4 mGy)  COMPLICATIONS: None immediate  PROCEDURE: Informed written consent was obtained from the patient after a discussion of the risks, benefits, and alternatives to treatment. Questions regarding the procedure were encouraged and answered.  Ultrasound scanning demonstrates hypoechoic occlusive thrombus within the diminutive right internal jugular vein, likely the sequela of recent dialysis catheter placement and subsequent removal. Note was made of a sizable adjacent right external jugular vein which was targeted for dialysis catheter placement given the patient's history of left-sided breast cancer and left upper extremity lymphedema.  The right neck and chest were prepped with chlorhexidine in a sterile fashion, and a sterile drape was applied covering the operative field. Maximum  barrier sterile technique with sterile gowns and gloves were used for the procedure. A timeout was performed prior to the initiation of the procedure.  After creating a small venotomy incision, a micropuncture kit was utilized to access the right external jugular vein under direct, real-time ultrasound guidance after the overlying soft tissues were anesthetized with 1% lidocaine with epinephrine. Ultrasound image documentation was performed. The microwire was kinked to measure appropriate catheter length. A stiff Glidewire was advanced to the level of the IVC and the micropuncture sheath was exchanged for a peel-away sheath. A Palindrome tunneled hemodialysis catheter measuring 19 cm from tip to cuff was tunneled in a retrograde fashion from the anterior chest wall to the venotomy incision.  The catheter was then placed through the peel-away sheath with tips ultimately positioned within the superior aspect of the  right atrium. Final catheter positioning was confirmed and documented with a spot radiographic image. The catheter aspirates and flushes normally. The catheter was flushed with appropriate volume heparin dwells.  The catheter exit site was secured with a 0-Prolene retention suture. The venotomy incision was closed with an interrupted 4-0 Vicryl, Dermabond and Steri-strips. Dressings were applied. The patient tolerated the procedure well without immediate post procedural complication.  IMPRESSION: 1. Successful placement of 19 cm tip to cuff tunneled hemodialysis catheter via the right external jugular vein with tips terminating within the superior aspect of the right atrium. The catheter is ready for immediate use. 2. Occlusion of the right internal jugular vein, likely the sequela of recent dialysis catheter placement and subsequent removal.   Electronically Signed   By: Sandi Mariscal M.D.   On: 12/28/2014 18:02   Ir US Guide Vasc Access Right  12/28/2014   INDICATION: End-stage renal disease. In need of  durable intravenous access for continuation of hemodialysis.  Patient with history of prior right internal jugular approach dialysis catheter placement on 10/21/2014 however the catheter was removed on 12/25/2014 given concern for infection. Patient is currently afebrile with normalization of white count.  EXAM: TUNNELED CENTRAL VENOUS HEMODIALYSIS CATHETER PLACEMENT WITH ULTRASOUND AND FLUOROSCOPIC GUIDANCE  MEDICATIONS: Ancef 3 gm IV; The IV antibiotic was given in an appropriate time interval prior to skin puncture.  CONTRAST:  None  ANESTHESIA/SEDATION: Versed 1.5 mg IV; Fentanyl 75 mcg IV  Total Moderate Sedation Time  15 minutes.  FLUOROSCOPY TIME:  48 seconds (83.8 mGy)  COMPLICATIONS: None immediate  PROCEDURE: Informed written consent was obtained from the patient after a discussion of the risks, benefits, and alternatives to treatment. Questions regarding the procedure were encouraged and answered.  Ultrasound scanning demonstrates hypoechoic occlusive thrombus within the diminutive right internal jugular vein, likely the sequela of recent dialysis catheter placement and subsequent removal. Note was made of a sizable adjacent right external jugular vein which was targeted for dialysis catheter placement given the patient's history of left-sided breast cancer and left upper extremity lymphedema.  The right neck and chest were prepped with chlorhexidine in a sterile fashion, and a sterile drape was applied covering the operative field. Maximum barrier sterile technique with sterile gowns and gloves were used for the procedure. A timeout was performed prior to the initiation of the procedure.  After creating a small venotomy incision, a micropuncture kit was utilized to access the right external jugular vein under direct, real-time ultrasound guidance after the overlying soft tissues were anesthetized with 1% lidocaine with epinephrine. Ultrasound image documentation was performed. The microwire was kinked to  measure appropriate catheter length. A stiff Glidewire was advanced to the level of the IVC and the micropuncture sheath was exchanged for a peel-away sheath. A Palindrome tunneled hemodialysis catheter measuring 19 cm from tip to cuff was tunneled in a retrograde fashion from the anterior chest wall to the venotomy incision.  The catheter was then placed through the peel-away sheath with tips ultimately positioned within the superior aspect of the right atrium. Final catheter positioning was confirmed and documented with a spot radiographic image. The catheter aspirates and flushes normally. The catheter was flushed with appropriate volume heparin dwells.  The catheter exit site was secured with a 0-Prolene retention suture. The venotomy incision was closed with an interrupted 4-0 Vicryl, Dermabond and Steri-strips. Dressings were applied. The patient tolerated the procedure well without immediate post procedural complication.  IMPRESSION: 1. Successful placement of 19 cm tip  to cuff tunneled hemodialysis catheter via the right external jugular vein with tips terminating within the superior aspect of the right atrium. The catheter is ready for immediate use. 2. Occlusion of the right internal jugular vein, likely the sequela of recent dialysis catheter placement and subsequent removal.   Electronically Signed   By: Sandi Mariscal M.D.   On: 12/28/2014 18:02   Medications:   . ampicillin-sulbactam (UNASYN) IV  3 g Intravenous Q24H  . calcitRIOL  0.75 mcg Oral Q T,Th,Sa-HD  . carvedilol  6.25 mg Oral BID WC  . gabapentin  300 mg Oral QHS  . heparin  5,000 Units Subcutaneous 3 times per day  . Influenza vac split quadrivalent PF  0.5 mL Intramuscular Tomorrow-1000  . insulin aspart  0-9 Units Subcutaneous TID WC  . insulin detemir  70 Units Subcutaneous Q24H  . pneumococcal 23 valent vaccine  0.5 mL Intramuscular Tomorrow-1000  . sodium chloride  3 mL Intravenous Q12H

## 2014-12-30 ENCOUNTER — Encounter: Payer: Self-pay | Admitting: Family Medicine

## 2014-12-31 LAB — GLUCOSE, CAPILLARY: Glucose-Capillary: 23 mg/dL — CL (ref 65–99)

## 2015-01-03 ENCOUNTER — Encounter: Payer: Medicare (Managed Care) | Admitting: Occupational Therapy

## 2015-01-10 ENCOUNTER — Encounter: Payer: Medicare (Managed Care) | Admitting: Occupational Therapy

## 2015-01-10 ENCOUNTER — Ambulatory Visit: Payer: Medicare (Managed Care) | Admitting: Family Medicine

## 2015-01-10 ENCOUNTER — Encounter: Payer: Self-pay | Admitting: Family Medicine

## 2015-01-10 ENCOUNTER — Ambulatory Visit (INDEPENDENT_AMBULATORY_CARE_PROVIDER_SITE_OTHER): Payer: Medicare (Managed Care) | Admitting: Family Medicine

## 2015-01-10 VITALS — BP 136/84 | HR 80 | Temp 98.7°F | Ht 65.0 in | Wt 271.2 lb

## 2015-01-10 DIAGNOSIS — R197 Diarrhea, unspecified: Secondary | ICD-10-CM

## 2015-01-10 DIAGNOSIS — R238 Other skin changes: Secondary | ICD-10-CM

## 2015-01-10 DIAGNOSIS — R195 Other fecal abnormalities: Secondary | ICD-10-CM | POA: Diagnosis not present

## 2015-01-10 DIAGNOSIS — E1121 Type 2 diabetes mellitus with diabetic nephropathy: Secondary | ICD-10-CM | POA: Diagnosis not present

## 2015-01-10 DIAGNOSIS — B952 Enterococcus as the cause of diseases classified elsewhere: Secondary | ICD-10-CM

## 2015-01-10 DIAGNOSIS — R0781 Pleurodynia: Secondary | ICD-10-CM

## 2015-01-10 DIAGNOSIS — L989 Disorder of the skin and subcutaneous tissue, unspecified: Secondary | ICD-10-CM

## 2015-01-10 DIAGNOSIS — R7881 Bacteremia: Secondary | ICD-10-CM

## 2015-01-10 MED ORDER — ZINC OXIDE 13 % EX CREA: 1.0000 "application " | TOPICAL_CREAM | Freq: Three times a day (TID) | CUTANEOUS | Status: DC

## 2015-01-10 NOTE — Progress Notes (Signed)
Pre visit review using our clinic review tool, if applicable. No additional management support is needed unless otherwise documented below in the visit note. 

## 2015-01-10 NOTE — Patient Instructions (Signed)
Nice to see you. I am glad you are improved.  Please use the zinc oxide barrie cream on your buttocks.  Monitor your blood sugar. If it is less than 80 or greater than 150 please let us know.  Please continue to monitor the pain in your left side. If this does not improve, it worsens, you have trouble breathing, you have chest pain, palpitations, or fevers please seek medical attention. If you develop fevers, shortness of breath, chest pain, abdominal pain, nausea, vomiting, blood in your stool, palpitations, confusion, or cough up blood please seek medical attention.

## 2015-01-13 DIAGNOSIS — R238 Other skin changes: Secondary | ICD-10-CM | POA: Insufficient documentation

## 2015-01-13 DIAGNOSIS — R0781 Pleurodynia: Secondary | ICD-10-CM | POA: Insufficient documentation

## 2015-01-13 DIAGNOSIS — R195 Other fecal abnormalities: Secondary | ICD-10-CM | POA: Insufficient documentation

## 2015-01-13 NOTE — Assessment & Plan Note (Signed)
CBGs in good range. No more hypoglycemia. Will continue current regimen. Patient will monitor for cbg elevation and hypoglycemia.

## 2015-01-13 NOTE — Progress Notes (Signed)
Patient ID: Jordan [name], female   DOB: 12/09/1940, 74 y.o.   MRN: [id number]  Jordan Rumps, MD Phone: [phone]  Jordan [name] is a 74 y.o. female who presents today for f/u.  Hospitalized for sepsis related to dialysis catheter infection. BCx grew enterococcus and urine grew klebsiella. Repeat BCx negative. Has been getting vanc at dialysis since discharge. Feels well at this time. No fevers or chills. Notes some loose stools since starting the vanc. No abdominal pain or N/V. No blood in stools. No dysuria, vaginal discharge, or vaginal bleeding. Feels well and back to normal.   DM: had low blood sugars in hospital. levemir decreased to 55 u. Has tolerated this well with cbgs at home int he 100-140 range. No additional low blood sugars. No hypoglycemia.   Notes irritation in gluteal cleft with loose stools. No drainage. No fevers. Feels raw from the loose stool and wiping.  Left sided rib pain: notes this in her left lower ribs for the past 5-6 days. Notes it bothers her with a deep breath. No cough, dyspnea, chest pain, or palpitations. Has had this in the past when she was constipated. Had this previously 2 months ago. No history of DVT. No history of PE. No cancer treatment in the past 6 months.   PMH: nonsmoker.   ROS see HPI  Objective  Physical Exam Filed Vitals:   01/10/15 1019  BP: 136/84  Pulse: 80  Temp: 98.7 F (37.1 C)    Physical Exam  Constitutional: She is well-developed, well-nourished, and in no distress.  HENT:  Head: Normocephalic and atraumatic.  Right Ear: External ear normal.  Left Ear: External ear normal.  Mouth/Throat: Oropharynx is clear and moist. No oropharyngeal exudate.  Eyes: Conjunctivae are normal. Pupils are equal, round, and reactive to light.  Cardiovascular: Normal rate, regular rhythm and normal heart sounds.  Exam reveals no gallop and no friction rub.   No murmur heard. Pulmonary/Chest: Effort normal and breath  sounds normal. No respiratory distress. She has no wheezes. She has no rales.  Abdominal: Soft. Bowel sounds are normal. She exhibits no distension. There is no tenderness. There is no rebound and no guarding.  Examined in chair as patient refused to lay down for exam  Musculoskeletal: She exhibits no edema.  Left mid axillary lower ribs with mild TTP, no defects noted, no swelling, no defects in left axilla  Neurological: She is alert.  Skin: Skin is warm and dry. She is not diaphoretic.  Mildly raw skin in gluteal cleft, no induration or erythema, no drainage, no warmth     Assessment/Plan: Please see individual problem list.  Enterococcal bacteremia Much improved since discharge from hospital. Is still on vanc. Will continue this at dialysis. No urinary symptoms. Will continue to monitor. Given return precautions.   Type 2 diabetes mellitus with diabetic nephropathy CBGs in good range. No more hypoglycemia. Will continue current regimen. Patient will monitor for cbg elevation and hypoglycemia.  Loose stools Onset of loose stools after starting vanc and being in the hospital. Concern is for c diff. Patient appears well hydrated. VSS. Benign abdominal exam. Will check c diff. Given return precautions.   Skin irritation Irritation likely from loose stools. No signs of infection. Will prescribe barrier cream to apply to this area. Given return precautions.   Rib pain on left side Rib pain on left side for several days. Likely MSK origin with tenderness noted. Normal HR and O2 sat making VTE unlikely in patient  with no prior history. Normal lung exam and O2 sat makes pulm cause unlikely. No chest pain or dyspnea and that it only occurs with deep breath makes cardiac cause unlikely. Discussed CXR and rib films to rule out fracture and evaluate for pulmonary process though patient declined this. Discussed d-dimer to rule out PE given her recent hospitalization, though patient declined this.  Patient opted to monitor this and pursue further eval if this persists or develops new symptoms. Given return precautions.     Orders Placed This Encounter  Procedures  . Stool C-Diff Toxin Assay    Meds ordered this encounter  Medications  . Zinc Oxide 13 % CREA    Sig: Apply 1 application topically 3 (three) times daily. To buttock cleft    Dispense:  136 g    Refill:  [name]

## 2015-01-13 NOTE — Assessment & Plan Note (Signed)
Onset of loose stools after starting vanc and being in the hospital. Concern is for c diff. Patient appears well hydrated. VSS. Benign abdominal exam. Will check c diff. Given return precautions.

## 2015-01-13 NOTE — Assessment & Plan Note (Signed)
Much improved since discharge from hospital. Is still on vanc. Will continue this at dialysis. No urinary symptoms. Will continue to monitor. Given return precautions.

## 2015-01-13 NOTE — Assessment & Plan Note (Signed)
Irritation likely from loose stools. No signs of infection. Will prescribe barrier cream to apply to this area. Given return precautions.

## 2015-01-13 NOTE — Assessment & Plan Note (Signed)
Rib pain on left side for several days. Likely MSK origin with tenderness noted. Normal HR and O2 sat making VTE unlikely in patient with no prior history. Normal lung exam and O2 sat makes pulm cause unlikely. No chest pain or dyspnea and that it only occurs with deep breath makes cardiac cause unlikely. Discussed CXR and rib films to rule out fracture and evaluate for pulmonary process though patient declined this. Discussed d-dimer to rule out PE given her recent hospitalization, though patient declined this. Patient opted to monitor this and pursue further eval if this persists or develops new symptoms. Given return precautions.

## 2015-01-14 ENCOUNTER — Ambulatory Visit: Payer: Medicare (Managed Care) | Admitting: Cardiology

## 2015-01-17 ENCOUNTER — Encounter: Payer: Medicare (Managed Care) | Admitting: Occupational Therapy

## 2015-01-18 ENCOUNTER — Other Ambulatory Visit: Payer: Self-pay | Admitting: Family Medicine

## 2015-01-18 ENCOUNTER — Ambulatory Visit: Payer: Medicare (Managed Care) | Admitting: Cardiology

## 2015-01-18 ENCOUNTER — Telehealth: Payer: Self-pay | Admitting: Surgical

## 2015-01-18 DIAGNOSIS — R197 Diarrhea, unspecified: Secondary | ICD-10-CM

## 2015-01-18 NOTE — Telephone Encounter (Signed)
Gwenette Greet called to inform us that the patient cancelled her initial evaluation for lymphedema. Gwenette Greet noted that the patient had been hospitalized and wanted to ensure that she was stable. Advised that I had seen the patient last week in the office and she appeared stable following discharge from the hospital. Advised that she appeared cardiovascularly stable at that time, though she's had a significant cardiac history recently requiring a hospitalization in July. Gwenette Greet noted this and thanked me for my help.

## 2015-01-18 NOTE — Telephone Encounter (Deleted)
Called from [facility] to see if the patient is medically stable for continuing lymphedema.

## 2015-01-18 NOTE — Telephone Encounter (Signed)
Maureen from [facility] called wanting to know if patient was stable for therapy. Dr. Caryl Bis spoke with her.

## 2015-01-19 ENCOUNTER — Ambulatory Visit: Payer: Medicare (Managed Care) | Attending: Family Medicine | Admitting: Occupational Therapy

## 2015-01-19 ENCOUNTER — Encounter: Payer: Self-pay | Admitting: Vascular Surgery

## 2015-01-19 ENCOUNTER — Encounter: Payer: Self-pay | Admitting: Occupational Therapy

## 2015-01-19 DIAGNOSIS — I972 Postmastectomy lymphedema syndrome: Secondary | ICD-10-CM

## 2015-01-19 NOTE — Patient Instructions (Signed)
Fitted with small Isotoner  Glove and Size E tubi grip from MC's of hand to elbow - do initiate light compression where her measurements is increase the most  Wear for week and will reassess circumference

## 2015-01-19 NOTE — Therapy (Signed)
[place] [address] [place], [address] Phone: [phone]   Fax:  [phone]  Occupational Therapy Treatment  Patient Details  Name: Jordan [name] MRN: [id number] Date of Birth: 05-27-1940 Referring Provider:  Leone Haven, MD  Encounter Date: 01/19/2015      OT End of Session - 01/19/15 1732    Visit Number 1   Number of Visits 8   Date for OT Re-Evaluation 02/16/15   OT Start Time 1057   OT Stop Time 1151   OT Time Calculation (min) 54 min   Activity Tolerance Patient tolerated treatment well;Patient limited by fatigue   Behavior During Therapy [facility] for tasks assessed/performed      Past Medical History  Diagnosis Date  . Breast cancer ([place])   . Diabetes mellitus with ESRD (end-stage renal disease) ([place])   . Hypertension   . Arthritis   . COPD (chronic obstructive pulmonary disease) ([place])   . Renal disorder     Family reports acute renal failure  . GERD (gastroesophageal reflux disease)   . CKD (chronic kidney disease)   . Systolic CHF, chronic ([place]) 12/23/2014    Past Surgical History  Procedure Laterality Date  . Breast surgery Left   . Cardiac catheterization [name] 10/19/2014    Procedure: Right/Left Heart Cath and Coronary Angiography;  Surgeon: Peter M Martinique, MD;  Location: [facility];  Service: Cardiovascular;  Laterality: [name];  . Av fistula placement Right 10/26/2014    Procedure: Right Brachiocephalic Arteriovenous FISTULA CREATION;  Surgeon: Conrad Follett, MD;  Location: [facility];  Service: Vascular;  Laterality: Right;  . Tee without cardioversion [name] 12/27/2014    Procedure: TRANSESOPHAGEAL ECHOCARDIOGRAM (TEE);  Surgeon: Thayer Headings, MD;  Location: [place];  Service: Cardiovascular;  Laterality: [name];    There were no vitals filed for this visit.  Visit Diagnosis:  Postmastectomy lymphedema syndrome - Plan: Ot plan of care cert/re-cert       Subjective Assessment - 01/19/15 1724    Subjective  My L arm swelling started about 7 months ago - I cannot remember that I did anything that triggered it    Patient Stated Goals I do not want my arm to get worse , or get infection   Currently in Pain? No/denies             LYMPHEDEMA/ONCOLOGY QUESTIONNAIRE - 01/19/15 1725    Type   Cancer Type L breast CA   Surgeries   Mastectomy Date 04/16/12   Number Lymph Nodes Removed 8   Date Lymphedema/Swelling Started   Date 06/15/14   Treatment   Past Chemotherapy Treatment Yes   Past Radiation Treatment Yes   What other symptoms do you have   Are you Having Heaviness or Tightness Yes   Other Symptoms Tingling in L hand and shoulder pain at times   Lymphedema Stage   Stage STAGE 2 SPONTANEOUSLY IRREVERSIBLE   Right Upper Extremity Lymphedema   15 cm Proximal to Olecranon Process 55.5 cm   10 cm Proximal to Olecranon Process 49.5 cm   Olecranon Process 29 cm   15 cm Proximal to Ulnar Styloid Process 29.5 cm   10 cm Proximal to Ulnar Styloid Process 26 cm   Just Proximal to Ulnar Styloid Process 19.5 cm   Across Hand at [employer] 20.8 cm   At [place] of 2nd Digit 7 cm   At [facility] of Thumb 7 cm  Left Upper Extremity Lymphedema   15 cm Proximal to Olecranon Process 54.5 cm   10 cm Proximal to Olecranon Process 51.7 cm   Olecranon Process 30 cm   15 cm Proximal to Ulnar Styloid Process 30.5 cm   10 cm Proximal to Ulnar Styloid Process 28 cm   Just Proximal to Ulnar Styloid Process 22.5 cm   Across Hand at [employer] 23 cm   At [place] of 2nd Digit 8.1 cm   At [facility] of Thumb 8 cm                 OT Treatments/Exercises (OP) - 01/19/15 0001    ADLs   ADL Comments Pt was fitted with Isotoner glove and Tubigrip E to wear day and night time - off with ADL's adn when out and about -  precautions  ed on with daughter and pt -  report feeling good                OT Education - 01/19/15 1731    Education  provided Yes   Education Details home program and findings of eval   Person(s) Educated Patient;Child(ren)   Methods Explanation;Demonstration;Tactile cues;Verbal cues;Handout   Comprehension Verbal cues required;Returned demonstration;Verbalized understanding          OT Short Term Goals - 01/19/15 1755    OT SHORT TERM GOAL #1   Title L UE circumference decreaes by 1 cm at hand thru elbow , 0.5 at digits to be fitted with compression sleeve/glove for daytime use    Baseline No compression this far - increase by 1 cm digits, 2 cm hand, forearm , upper arm; 3 cm at wrist    Time 3   Period Weeks   Status New           OT Long Term Goals - 01/19/15 1757    OT LONG TERM GOAL #1   Title Pt and family be ind in donning appriate compression garments to decrease and maintain  L UE lymphedema to decrease risk for infection    Baseline no knowledge  on  lymphedema or compression   Time 4   Period Weeks   Status New               Plan - 01/19/15 1733    Clinical Impression Statement Pt present with L UE lymphedema that she report she had for about 7 months - denies any lifting, moving or flying - pt did had heart attack in July as well as started dialysis- Fistula in R UE and port in R chest- she and her daughter do report  swollen  L  upper  thoracix - under arm - but did decrease since dialysis -  would recommend  Jovipak  compression pad to decongest trunk , and then started  hand and forearm light compression -  will assess in week if she responded  to compression - Pt L UE compare to R is increase by 1 cm in digits, 2 cm at hand and forearm/upper arm; 3 cm at wrist,  and 1 cm at elbow- if she do need compression bandaging will contact cardiologist to clear pt for compression  daytime nad night time  - Pt L shoulder flexion 100; abd 90 degrees, R 150 for flexion and abd  - pt can benefit from  bilateral UE AROM     Pt will benefit from skilled therapeutic intervention in order to  improve on the following deficits (Retired)  Impaired UE functional use;Impaired sensation;Impaired flexibility;Decreased strength;Decreased range of motion;Decreased activity tolerance;Increased edema   Rehab Potential Good   OT Frequency 1x / week   OT Duration 4 weeks   OT Treatment/Interventions Manual lymph drainage;Therapeutic exercise;Passive range of motion;Patient/family education;Self-care/ADL training   Plan assess circumference from compression to hand and forearm    OT Home Exercise Plan see pt instruction   Consulted and Agree with Plan of Care Patient;Family member/caregiver   Family Member Consulted daughter          G-Codes - 2015/02/01 Jul 20, 1799    Functional Assessment Tool Used circumference of L UE , ROM , pain and Quickdash   Functional Limitation Self care   Self Care Current Status [phone]) At least 40 percent but less than 60 percent impaired, limited or restricted   Self Care Goal Status [id number]) At least 20 percent but less than 40 percent impaired, limited or restricted      Problem List Patient Active Problem List   Diagnosis Date Noted  . Loose stools 01/13/2015  . Skin irritation 01/13/2015  . Rib pain on left side 01/13/2015  . Enterococcal bacteremia 12/26/2014  . Bacteremia   . UTI (lower urinary tract infection) 12/23/2014  . Acute encephalopathy 12/23/2014  . Systolic CHF, chronic ([place]) 12/23/2014  . Encephalopathy, metabolic [id number]  . Lymphedema of left arm 12/08/2014  . OSA (obstructive sleep apnea) 12/08/2014  . Constipation 12/08/2014  . Nonischemic cardiomyopathy ([place]) 11/10/2014  . Acute on chronic systolic heart failure, NYHA class 3 ([place]) 11/10/2014  . Morbidly obese ([place]) 11/10/2014  . ESRD (end stage renal disease) on dialysis ([place])   . Type 2 diabetes mellitus with diabetic nephropathy ([place])   . Essential hypertension   . Left hip pain   . NSTEMI (non-ST elevated myocardial infarction) ([place]) 10/13/2014  . Pulmonary HTN ([place])  10/13/2014  . Syncope 10/12/2014  . SOB (shortness of breath) 10/12/2014  . Chest pain with high risk for cardiac etiology 10/12/2014  . LBBB (left bundle branch block) 10/12/2014  . Breast cancer ([place])   . Hypertension   . Arthritis   . COPD (chronic obstructive pulmonary disease) ([place])     Mikeria Valin OTR/L,CLT 2015/02/01, 6:09 PM  [place] [id number], [place], [address] Phone: [phone]   Fax:  [phone]

## 2015-01-21 ENCOUNTER — Other Ambulatory Visit: Payer: Self-pay

## 2015-01-21 ENCOUNTER — Encounter: Payer: Self-pay | Admitting: Vascular Surgery

## 2015-01-21 ENCOUNTER — Ambulatory Visit (INDEPENDENT_AMBULATORY_CARE_PROVIDER_SITE_OTHER): Payer: Medicare (Managed Care) | Admitting: Vascular Surgery

## 2015-01-21 VITALS — BP 112/48 | HR 70 | Temp 98.0°F | Resp 18 | Ht 65.0 in | Wt 267.0 lb

## 2015-01-21 DIAGNOSIS — N186 End stage renal disease: Secondary | ICD-10-CM

## 2015-01-21 DIAGNOSIS — Z992 Dependence on renal dialysis: Secondary | ICD-10-CM

## 2015-01-21 NOTE — Progress Notes (Signed)
Postoperative Access Visit   History of Present Illness  Jordan [name] is a 74 y.o. year old female who presents for postoperative follow-up for: R BC AVF (Date: 10/26/14).  The patient sent for follow up.  The dialysis center is having difficulties cannulating this patient.  They were able to run once for ~1.5 hours.  The patient notes no steal symptoms.  The patient is able to complete their activities of daily living.  The patient's current symptoms are: none.  Past Medical History  Diagnosis Date  . Breast cancer ([place])   . Diabetes mellitus with ESRD (end-stage renal disease) ([place])   . Hypertension   . Arthritis   . COPD (chronic obstructive pulmonary disease) ([place])   . Renal disorder     Family reports acute renal failure  . GERD (gastroesophageal reflux disease)   . CKD (chronic kidney disease)   . Systolic CHF, chronic ([place]) 12/23/2014    Past Surgical History  Procedure Laterality Date  . Breast surgery Left   . Cardiac catheterization [name] 10/19/2014    Procedure: Right/Left Heart Cath and Coronary Angiography;  Surgeon: Peter M Martinique, MD;  Location: [facility];  Service: Cardiovascular;  Laterality: [name];  . Av fistula placement Right 10/26/2014    Procedure: Right Brachiocephalic Arteriovenous FISTULA CREATION;  Surgeon: Conrad Woodburn, MD;  Location: [facility];  Service: Vascular;  Laterality: Right;  . Tee without cardioversion [name] 12/27/2014    Procedure: TRANSESOPHAGEAL ECHOCARDIOGRAM (TEE);  Surgeon: Thayer Headings, MD;  Location: [facility];  Service: Cardiovascular;  Laterality: [name];    Social History   Social History  . Marital Status: Single    Spouse Name: [name]  . Number of Children: [name]  . Years of Education: [name]   Occupational History  . Not on file.   Social History Main Topics  . Smoking status: Never Smoker   . Smokeless tobacco: Not on file  . Alcohol Use: No  . Drug Use: Not on file  . Sexual Activity: Not on file   Other Topics  Concern  . Not on file   Social History Narrative    Family History  Problem Relation Age of Onset  . Diabetes Mother   . Hypertension Mother   . Stroke Sister   . Hypertension Sister   . Heart attack Neg Hx     Current Outpatient Prescriptions  Medication Sig Dispense Refill  . albuterol (PROVENTIL HFA;VENTOLIN HFA) 108 (90 BASE) MCG/ACT inhaler Inhale 1-2 puffs into the lungs every 6 (six) hours as needed for wheezing or shortness of breath.    . carvedilol (COREG) 6.25 MG tablet Take 1 tablet (6.25 mg total) by mouth 2 (two) times daily with a meal. 60 tablet 1  . gabapentin (NEURONTIN) 300 MG capsule Take 1 capsule (300 mg total) by mouth at bedtime. 60 capsule 6  . HYDROcodone-acetaminophen (NORCO/VICODIN) 5-325 MG per tablet Take 1 tablet by mouth every 6 (six) hours as needed for moderate pain.    . Insulin Detemir (LEVEMIR FLEXPEN) 100 UNIT/ML Pen Inject 55 Units into the skin every morning. 15 mL 11  . insulin lispro (HUMALOG KWIKPEN) 100 UNIT/ML KiwkPen Inject 1-5 Units into the skin 3 (three) times daily as needed (blood sugar).     . Multiple Vitamins-Minerals (CENTRUM SILVER ADULT 50+ PO) Take 1 tablet by mouth daily.    . Zinc Oxide 13 % CREA Apply 1 application topically 3 (three) times daily. To buttock cleft 136  g 1  . vancomycin in sodium chloride 0.9 % 100 mL Patient will need IV vancomycin during hemodialysis for enterococcus bacteremia for next 2 weeks start date 12/29/2014, this is to be dosed and manage by nephrology at hemodialysis center (Patient not taking: Reported on 01/21/2015)     No current facility-administered medications for this visit.     No Known Allergies   REVIEW OF SYSTEMS:  (Positives checked otherwise negative)  CARDIOVASCULAR:   [ ]  chest pain,  [ ]  chest pressure,  [ ]  palpitations,  [ ]  shortness of breath when laying flat,  [ ]  shortness of breath with exertion,   [ ]  pain in feet when walking,  [ ]  pain in feet when laying  flat, [ ]  history of blood clot in veins (DVT),  [ ]  history of phlebitis,  [ ]  swelling in legs,  [ ]  varicose veins  PULMONARY:   [ ]  productive cough,  [ ]  asthma,  [ ]  wheezing  NEUROLOGIC:   [ ]  weakness in arms or legs,  [ ]  numbness in arms or legs,  [ ]  difficulty speaking or slurred speech,  [ ]  temporary loss of vision in one eye,  [ ]  dizziness  HEMATOLOGIC:   [ ]  bleeding problems,  [ ]  problems with blood clotting too easily  MUSCULOSKEL:   [ ]  joint pain, [ ]  joint swelling  GASTROINTEST:   [ ]  vomiting blood,  [ ]  blood in stool     GENITOURINARY:   [ ]  burning with urination,  [ ]  blood in urine [x]  ESRD-HD: T-R-S  PSYCHIATRIC:   [ ]  history of major depression  INTEGUMENTARY:   [ ]  rashes,  [ ]  ulcers  CONSTITUTIONAL:   [ ]  fever,  [ ]  chills    Physical Examination Filed Vitals:   01/21/15 1417  BP: 112/48  Pulse: 70  Temp: 98 F (36.7 C)  Resp: 18   Body mass index is 44.43 kg/(m^2).  Pulmonary: Sym exp, good air movt, CTAB, no rales, rhonchi, & wheezing  Cardiac: RRR, Nl S1, S2, no Murmurs, rubs or gallops  RUE: Incision is healed, skin feels warm, hand grip is 5/5, sensation in digits is intact, palpable thrill in distal 1/3, cannot feel in proximal 2/3, somewhat pulsatile at 2/3 position, bruit can be auscultated    Medical Decision Making  Jordan [name] is a 74 y.o. year old female who presents s/p R BC AVF.   Not too surprise with difficulty given this patient's obesity.  Given the fistula is more pulsatile than previously, would obtain a R arm fistulogram to verify no interval development of venous stenosis prior to proceeding with superficialization of R BC AVF.  This fistulogram is setup for this coming Monday.  If the fistula is widely patent, will try to schedule superficialization this coming Wednesday. I discussed with the patient the nature of angiographic procedures, especially the limited patencies  of any endovascular intervention.  The patient is aware of that the risks of an angiographic procedure include but are not limited to: bleeding, infection, access site complications, renal failure, embolization, rupture of vessel, dissection, possible need for emergent surgical intervention, possible need for surgical procedures to treat the patient's pathology, anaphylactic reaction to contrast, and stroke and death.   The patient is aware of the risks and agrees to proceed.  Thank you for allowing [place] to participate in this patient's care.  Adele Barthel, MD [facility]  Office: [phone] Pager: [phone]  01/21/2015, 2:41 PM

## 2015-01-24 ENCOUNTER — Institutional Professional Consult (permissible substitution): Payer: Medicare (Managed Care) | Admitting: Internal Medicine

## 2015-01-24 ENCOUNTER — Encounter (HOSPITAL_COMMUNITY)
Admission: RE | Disposition: A | Discharge status: Home / Self Care | Payer: Self-pay | Source: Ambulatory Visit | Attending: Vascular Surgery

## 2015-01-24 ENCOUNTER — Ambulatory Visit: Payer: Medicare (Managed Care) | Admitting: Cardiology

## 2015-01-24 ENCOUNTER — Ambulatory Visit (HOSPITAL_COMMUNITY)
Admission: RE | Admit: 2015-01-24 | Discharge: 2015-01-24 | Disposition: A | Discharge status: Home / Self Care | Payer: Medicare (Managed Care) | Source: Ambulatory Visit | Attending: Vascular Surgery | Admitting: Vascular Surgery

## 2015-01-24 DIAGNOSIS — T82898A Other specified complication of vascular prosthetic devices, implants and grafts, initial encounter: Secondary | ICD-10-CM | POA: Diagnosis not present

## 2015-01-24 DIAGNOSIS — Z992 Dependence on renal dialysis: Secondary | ICD-10-CM | POA: Diagnosis not present

## 2015-01-24 DIAGNOSIS — E1122 Type 2 diabetes mellitus with diabetic chronic kidney disease: Secondary | ICD-10-CM | POA: Diagnosis not present

## 2015-01-24 DIAGNOSIS — Z4901 Encounter for fitting and adjustment of extracorporeal dialysis catheter: Secondary | ICD-10-CM | POA: Diagnosis not present

## 2015-01-24 DIAGNOSIS — Z853 Personal history of malignant neoplasm of breast: Secondary | ICD-10-CM | POA: Diagnosis not present

## 2015-01-24 DIAGNOSIS — I5022 Chronic systolic (congestive) heart failure: Secondary | ICD-10-CM | POA: Diagnosis not present

## 2015-01-24 DIAGNOSIS — J449 Chronic obstructive pulmonary disease, unspecified: Secondary | ICD-10-CM | POA: Insufficient documentation

## 2015-01-24 DIAGNOSIS — I132 Hypertensive heart and chronic kidney disease with heart failure and with stage 5 chronic kidney disease, or end stage renal disease: Secondary | ICD-10-CM | POA: Diagnosis not present

## 2015-01-24 DIAGNOSIS — N186 End stage renal disease: Secondary | ICD-10-CM | POA: Diagnosis not present

## 2015-01-24 DIAGNOSIS — Z794 Long term (current) use of insulin: Secondary | ICD-10-CM | POA: Diagnosis not present

## 2015-01-24 DIAGNOSIS — K219 Gastro-esophageal reflux disease without esophagitis: Secondary | ICD-10-CM | POA: Insufficient documentation

## 2015-01-24 HISTORY — PX: PERIPHERAL VASCULAR CATHETERIZATION: SHX172C

## 2015-01-24 LAB — POCT I-STAT, CHEM 8
BUN: 40 mg/dL — AB (ref 6–20)
CREATININE: 7.6 mg/dL — AB (ref 0.44–1.00)
Calcium, Ion: 1.04 mmol/L — ABNORMAL LOW (ref 1.13–1.30)
Chloride: 100 mmol/L — ABNORMAL LOW (ref 101–111)
Glucose, Bld: 180 mg/dL — ABNORMAL HIGH (ref 65–99)
HEMATOCRIT: 39 % (ref 36.0–46.0)
HEMOGLOBIN: 13.3 g/dL (ref 12.0–15.0)
POTASSIUM: 4.8 mmol/L (ref 3.5–5.1)
Sodium: 135 mmol/L (ref 135–145)
TCO2: 25 mmol/L (ref 0–100)

## 2015-01-24 LAB — GLUCOSE, CAPILLARY: Glucose-Capillary: 136 mg/dL — ABNORMAL HIGH (ref 65–99)

## 2015-01-24 SURGERY — A/V SHUNTOGRAM/FISTULAGRAM
Anesthesia: LOCAL

## 2015-01-24 MED ORDER — SODIUM CHLORIDE 0.9 % IV SOLN: 1.0000 mL/kg/h | INTRAVENOUS | Status: DC

## 2015-01-24 MED ORDER — LIDOCAINE HCL (PF) 1 % IJ SOLN
INTRAMUSCULAR | Status: AC
  Filled 2015-01-24: qty 30

## 2015-01-24 MED ORDER — SODIUM CHLORIDE 0.9 % IJ SOLN: 3.0000 mL | Freq: Two times a day (BID) | INTRAMUSCULAR | Status: DC

## 2015-01-24 MED ORDER — LIDOCAINE HCL (PF) 1 % IJ SOLN
INTRAMUSCULAR | Status: DC | PRN
  Administered 2015-01-24: 50 mL

## 2015-01-24 MED ORDER — HEPARIN (PORCINE) IN NACL 2-0.9 UNIT/ML-% IJ SOLN
INTRAMUSCULAR | Status: AC
  Filled 2015-01-24: qty 1000

## 2015-01-24 MED ORDER — SODIUM CHLORIDE 0.9 % IV SOLN: 250.0000 mL | INTRAVENOUS | Status: DC | PRN

## 2015-01-24 MED ORDER — SODIUM CHLORIDE 0.9 % IJ SOLN: 3.0000 mL | INTRAMUSCULAR | Status: DC | PRN

## 2015-01-24 MED ORDER — ACETAMINOPHEN 325 MG PO TABS: 650.0000 mg | ORAL_TABLET | ORAL | Status: DC | PRN

## 2015-01-24 MED ORDER — LIDOCAINE HCL (PF) 1 % IJ SOLN
INTRAMUSCULAR | Status: DC | PRN
  Administered 2015-01-24: 6 mL

## 2015-01-24 SURGICAL SUPPLY — 11 items
BAG SNAP BAND KOVER 36X36 (MISCELLANEOUS) ×2 IMPLANT
COVER DOME SNAP 22 D (MISCELLANEOUS) ×2 IMPLANT
COVER PRB 48X5XTLSCP FOLD TPE (BAG) ×1 IMPLANT
COVER PROBE 5X48 (BAG) ×1
KIT MICROINTRODUCER STIFF 5F (SHEATH) ×2 IMPLANT
PROTECTION STATION PRESSURIZED (MISCELLANEOUS) ×2
STATION PROTECTION PRESSURIZED (MISCELLANEOUS) ×1 IMPLANT
STOPCOCK MORSE 400PSI 3WAY (MISCELLANEOUS) ×2 IMPLANT
TRAY PV CATH (CUSTOM PROCEDURE TRAY) ×2 IMPLANT
TUBING CIL FLEX 10 FLL-RA (TUBING) ×2 IMPLANT
WIRE BENTSON .035X145CM (WIRE) ×2 IMPLANT

## 2015-01-24 NOTE — H&P (View-Only) (Signed)
Postoperative Access Visit   History of Present Illness  Jordan [name] is a 74 y.o. year old female who presents for postoperative follow-up for: R BC AVF (Date: 10/26/14).  The patient sent for follow up.  The dialysis center is having difficulties cannulating this patient.  They were able to run once for ~1.5 hours.  The patient notes no steal symptoms.  The patient is able to complete their activities of daily living.  The patient's current symptoms are: none.  Past Medical History  Diagnosis Date  . Breast cancer ([place])   . Diabetes mellitus with ESRD (end-stage renal disease) ([place])   . Hypertension   . Arthritis   . COPD (chronic obstructive pulmonary disease) ([place])   . Renal disorder     Family reports acute renal failure  . GERD (gastroesophageal reflux disease)   . CKD (chronic kidney disease)   . Systolic CHF, chronic ([place]) 12/23/2014    Past Surgical History  Procedure Laterality Date  . Breast surgery Left   . Cardiac catheterization [name] 10/19/2014    Procedure: Right/Left Heart Cath and Coronary Angiography;  Surgeon: Peter M Martinique, MD;  Location: [facility];  Service: Cardiovascular;  Laterality: [name];  . Av fistula placement Right 10/26/2014    Procedure: Right Brachiocephalic Arteriovenous FISTULA CREATION;  Surgeon: Conrad Thorp, MD;  Location: [facility];  Service: Vascular;  Laterality: Right;  . Tee without cardioversion [name] 12/27/2014    Procedure: TRANSESOPHAGEAL ECHOCARDIOGRAM (TEE);  Surgeon: Thayer Headings, MD;  Location: [facility];  Service: Cardiovascular;  Laterality: [name];    Social History   Social History  . Marital Status: Single    Spouse Name: [name]  . Number of Children: [name]  . Years of Education: [name]   Occupational History  . Not on file.   Social History Main Topics  . Smoking status: Never Smoker   . Smokeless tobacco: Not on file  . Alcohol Use: No  . Drug Use: Not on file  . Sexual Activity: Not on file   Other Topics  Concern  . Not on file   Social History Narrative    Family History  Problem Relation Age of Onset  . Diabetes Mother   . Hypertension Mother   . Stroke Sister   . Hypertension Sister   . Heart attack Neg Hx     Current Outpatient Prescriptions  Medication Sig Dispense Refill  . albuterol (PROVENTIL HFA;VENTOLIN HFA) 108 (90 BASE) MCG/ACT inhaler Inhale 1-2 puffs into the lungs every 6 (six) hours as needed for wheezing or shortness of breath.    . carvedilol (COREG) 6.25 MG tablet Take 1 tablet (6.25 mg total) by mouth 2 (two) times daily with a meal. 60 tablet 1  . gabapentin (NEURONTIN) 300 MG capsule Take 1 capsule (300 mg total) by mouth at bedtime. 60 capsule 6  . HYDROcodone-acetaminophen (NORCO/VICODIN) 5-325 MG per tablet Take 1 tablet by mouth every 6 (six) hours as needed for moderate pain.    . Insulin Detemir (LEVEMIR FLEXPEN) 100 UNIT/ML Pen Inject 55 Units into the skin every morning. 15 mL 11  . insulin lispro (HUMALOG KWIKPEN) 100 UNIT/ML KiwkPen Inject 1-5 Units into the skin 3 (three) times daily as needed (blood sugar).     . Multiple Vitamins-Minerals (CENTRUM SILVER ADULT 50+ PO) Take 1 tablet by mouth daily.    . Zinc Oxide 13 % CREA Apply 1 application topically 3 (three) times daily. To buttock cleft 136  g 1  . vancomycin in sodium chloride 0.9 % 100 mL Patient will need IV vancomycin during hemodialysis for enterococcus bacteremia for next 2 weeks start date 12/29/2014, this is to be dosed and manage by nephrology at hemodialysis center (Patient not taking: Reported on 01/21/2015)     No current facility-administered medications for this visit.     No Known Allergies   REVIEW OF SYSTEMS:  (Positives checked otherwise negative)  CARDIOVASCULAR:   [ ]  chest pain,  [ ]  chest pressure,  [ ]  palpitations,  [ ]  shortness of breath when laying flat,  [ ]  shortness of breath with exertion,   [ ]  pain in feet when walking,  [ ]  pain in feet when laying  flat, [ ]  history of blood clot in veins (DVT),  [ ]  history of phlebitis,  [ ]  swelling in legs,  [ ]  varicose veins  PULMONARY:   [ ]  productive cough,  [ ]  asthma,  [ ]  wheezing  NEUROLOGIC:   [ ]  weakness in arms or legs,  [ ]  numbness in arms or legs,  [ ]  difficulty speaking or slurred speech,  [ ]  temporary loss of vision in one eye,  [ ]  dizziness  HEMATOLOGIC:   [ ]  bleeding problems,  [ ]  problems with blood clotting too easily  MUSCULOSKEL:   [ ]  joint pain, [ ]  joint swelling  GASTROINTEST:   [ ]  vomiting blood,  [ ]  blood in stool     GENITOURINARY:   [ ]  burning with urination,  [ ]  blood in urine [x]  ESRD-HD: T-R-S  PSYCHIATRIC:   [ ]  history of major depression  INTEGUMENTARY:   [ ]  rashes,  [ ]  ulcers  CONSTITUTIONAL:   [ ]  fever,  [ ]  chills    Physical Examination Filed Vitals:   01/21/15 1417  BP: 112/48  Pulse: 70  Temp: 98 F (36.7 C)  Resp: 18   Body mass index is 44.43 kg/(m^2).  Pulmonary: Sym exp, good air movt, CTAB, no rales, rhonchi, & wheezing  Cardiac: RRR, Nl S1, S2, no Murmurs, rubs or gallops  RUE: Incision is healed, skin feels warm, hand grip is 5/5, sensation in digits is intact, palpable thrill in distal 1/3, cannot feel in proximal 2/3, somewhat pulsatile at 2/3 position, bruit can be auscultated    Medical Decision Making  Jordan [name] is a 74 y.o. year old female who presents s/p R BC AVF.   Not too surprise with difficulty given this patient's obesity.  Given the fistula is more pulsatile than previously, would obtain a R arm fistulogram to verify no interval development of venous stenosis prior to proceeding with superficialization of R BC AVF.  This fistulogram is setup for this coming Monday.  If the fistula is widely patent, will try to schedule superficialization this coming Wednesday. I discussed with the patient the nature of angiographic procedures, especially the limited patencies  of any endovascular intervention.  The patient is aware of that the risks of an angiographic procedure include but are not limited to: bleeding, infection, access site complications, renal failure, embolization, rupture of vessel, dissection, possible need for emergent surgical intervention, possible need for surgical procedures to treat the patient's pathology, anaphylactic reaction to contrast, and stroke and death.   The patient is aware of the risks and agrees to proceed.  Thank you for allowing [place] to participate in this patient's care.  Adele Barthel, MD [facility]  Office: [phone] Pager: [phone]  01/21/2015, 2:41 PM

## 2015-01-24 NOTE — Interval H&P Note (Signed)
[facility] [place]  History and Physical Update  The patient was interviewed and re-examined.  The patient's previous History and Physical has been reviewed and is unchanged from my consult.  There is no change in the plan of care: R arm fistulogram, possible intervention.  I discussed with the patient the nature of angiographic procedures, especially the limited patencies of any endovascular intervention.  The patient is aware of that the risks of an angiographic procedure include but are not limited to: bleeding, infection, access site complications, renal failure, embolization, rupture of vessel, dissection, possible need for emergent surgical intervention, possible need for surgical procedures to treat the patient's pathology, anaphylactic reaction to contrast, and stroke and death.  The patient is aware of the risks and agrees to proceed.   Adele Barthel, MD [facility] [place] Office: [phone] Pager: [phone]  01/24/2015, 12:22 PM

## 2015-01-24 NOTE — Discharge Instructions (Signed)
Fistulogram, Care After °Refer to this sheet in the next few weeks. These instructions provide you with information on caring for yourself after your procedure. Your health care provider may also give you more specific instructions. Your treatment has been planned according to current medical practices, but problems sometimes occur. Call your health care provider if you have any problems or questions after your procedure. °WHAT TO EXPECT AFTER THE PROCEDURE °After your procedure, it is typical to have the following: °· A small amount of discomfort in the area where the catheters were placed. °· A small amount of bruising around the fistula. °· Sleepiness and fatigue. °HOME CARE INSTRUCTIONS °· Rest at home for the day following your procedure. °· Do not drive or operate heavy machinery while taking pain medicine. °· Take medicines only as directed by your health care provider. °· Do not take baths, swim, or use a hot tub until your health care provider approves. You may shower 24 hours after the procedure or as directed by your health care provider. °· There are many different ways to close and cover an incision, including stitches, skin glue, and adhesive strips. Follow your health care provider's instructions on: °¨ Incision care. °¨ Bandage (dressing) changes and removal. °¨ Incision closure removal. °· Monitor your dialysis fistula carefully. °SEEK MEDICAL CARE IF: °· You have drainage, redness, swelling, or pain at your catheter site. °· You have a fever. °· You have chills. °SEEK IMMEDIATE MEDICAL CARE IF: °· You feel weak. °· You have trouble balancing. °· You have trouble moving your arms or legs. °· You have problems with your speech or vision. °· You can no longer feel a vibration or buzz when you put your fingers over your dialysis fistula. °· The limb that was used for the procedure: °¨ Swells. °¨ Is painful. °¨ Is cold. °¨ Is discolored, such as blue or pale white. °  °This information is not intended  to replace advice given to you by your health care provider. Make sure you discuss any questions you have with your health care provider. °  °Document Released: 08/17/2013 Document Reviewed: 08/17/2013 °Elsevier Interactive Patient Education ©2016 [employer] ° °

## 2015-01-24 NOTE — Progress Notes (Signed)
PIV access not obtained.  IV team attempted with no success.  Dr Bridgett Larsson notified per Anderson Malta, RN and he is ok with no PIV access.

## 2015-01-25 ENCOUNTER — Other Ambulatory Visit: Payer: Self-pay

## 2015-01-25 ENCOUNTER — Encounter (HOSPITAL_COMMUNITY): Payer: Self-pay | Admitting: Vascular Surgery

## 2015-01-25 MED ORDER — DEXTROSE 5 % IV SOLN: 1.5000 g | INTRAVENOUS | Status: DC

## 2015-01-25 NOTE — Progress Notes (Signed)
Pt denies SOB and chest pain but is under the care of Dr. Martinique, cardiology. Pt made aware to stop otc vitamins and herbal medications. Pt verbalized understanding of all pre-op instructions.

## 2015-01-25 NOTE — Progress Notes (Signed)
Dr. Jillyn Hidden, anesthesia, reviewed EKG; no new orders given.

## 2015-01-26 ENCOUNTER — Encounter (HOSPITAL_COMMUNITY)
Admission: RE | Disposition: A | Discharge status: Home / Self Care | Payer: Self-pay | Source: Ambulatory Visit | Attending: Vascular Surgery

## 2015-01-26 ENCOUNTER — Ambulatory Visit (HOSPITAL_COMMUNITY): Payer: Medicare (Managed Care) | Admitting: Anesthesiology

## 2015-01-26 ENCOUNTER — Encounter (HOSPITAL_COMMUNITY): Payer: Self-pay

## 2015-01-26 ENCOUNTER — Ambulatory Visit: Payer: Medicare (Managed Care) | Admitting: Occupational Therapy

## 2015-01-26 ENCOUNTER — Ambulatory Visit (HOSPITAL_COMMUNITY)
Admission: RE | Admit: 2015-01-26 | Discharge: 2015-01-26 | Disposition: A | Discharge status: Home / Self Care | Payer: Medicare (Managed Care) | Source: Ambulatory Visit | Attending: Vascular Surgery | Admitting: Vascular Surgery

## 2015-01-26 DIAGNOSIS — I5022 Chronic systolic (congestive) heart failure: Secondary | ICD-10-CM | POA: Diagnosis not present

## 2015-01-26 DIAGNOSIS — M1990 Unspecified osteoarthritis, unspecified site: Secondary | ICD-10-CM | POA: Diagnosis not present

## 2015-01-26 DIAGNOSIS — N186 End stage renal disease: Secondary | ICD-10-CM | POA: Insufficient documentation

## 2015-01-26 DIAGNOSIS — Z79891 Long term (current) use of opiate analgesic: Secondary | ICD-10-CM | POA: Diagnosis not present

## 2015-01-26 DIAGNOSIS — J449 Chronic obstructive pulmonary disease, unspecified: Secondary | ICD-10-CM | POA: Insufficient documentation

## 2015-01-26 DIAGNOSIS — I132 Hypertensive heart and chronic kidney disease with heart failure and with stage 5 chronic kidney disease, or end stage renal disease: Secondary | ICD-10-CM | POA: Insufficient documentation

## 2015-01-26 DIAGNOSIS — Z992 Dependence on renal dialysis: Secondary | ICD-10-CM | POA: Diagnosis not present

## 2015-01-26 DIAGNOSIS — E669 Obesity, unspecified: Secondary | ICD-10-CM | POA: Insufficient documentation

## 2015-01-26 DIAGNOSIS — G4730 Sleep apnea, unspecified: Secondary | ICD-10-CM | POA: Insufficient documentation

## 2015-01-26 DIAGNOSIS — I252 Old myocardial infarction: Secondary | ICD-10-CM | POA: Diagnosis not present

## 2015-01-26 DIAGNOSIS — T82898A Other specified complication of vascular prosthetic devices, implants and grafts, initial encounter: Secondary | ICD-10-CM | POA: Diagnosis not present

## 2015-01-26 DIAGNOSIS — Z853 Personal history of malignant neoplasm of breast: Secondary | ICD-10-CM | POA: Diagnosis not present

## 2015-01-26 DIAGNOSIS — Z6841 Body Mass Index (BMI) 40.0 and over, adult: Secondary | ICD-10-CM | POA: Insufficient documentation

## 2015-01-26 DIAGNOSIS — K219 Gastro-esophageal reflux disease without esophagitis: Secondary | ICD-10-CM | POA: Diagnosis not present

## 2015-01-26 DIAGNOSIS — Z794 Long term (current) use of insulin: Secondary | ICD-10-CM | POA: Diagnosis not present

## 2015-01-26 DIAGNOSIS — E1122 Type 2 diabetes mellitus with diabetic chronic kidney disease: Secondary | ICD-10-CM | POA: Diagnosis not present

## 2015-01-26 DIAGNOSIS — Z79899 Other long term (current) drug therapy: Secondary | ICD-10-CM | POA: Diagnosis not present

## 2015-01-26 HISTORY — PX: FISTULA SUPERFICIALIZATION: SHX6341

## 2015-01-26 LAB — POCT I-STAT 4, (NA,K, GLUC, HGB,HCT)
GLUCOSE: 156 mg/dL — AB (ref 65–99)
HEMATOCRIT: 40 % (ref 36.0–46.0)
Hemoglobin: 13.6 g/dL (ref 12.0–15.0)
POTASSIUM: 5.2 mmol/L — AB (ref 3.5–5.1)
Sodium: 133 mmol/L — ABNORMAL LOW (ref 135–145)

## 2015-01-26 LAB — GLUCOSE, CAPILLARY
GLUCOSE-CAPILLARY: 151 mg/dL — AB (ref 65–99)
Glucose-Capillary: 139 mg/dL — ABNORMAL HIGH (ref 65–99)

## 2015-01-26 SURGERY — FISTULA SUPERFICIALIZATION
Anesthesia: General | Site: Arm Upper | Laterality: Right

## 2015-01-26 MED ORDER — HYDROCODONE-ACETAMINOPHEN 5-325 MG PO TABS: 1.0000 | ORAL_TABLET | Freq: Four times a day (QID) | ORAL | Status: DC | PRN

## 2015-01-26 MED ORDER — HYDROCODONE-ACETAMINOPHEN 5-325 MG PO TABS
1.0000 | ORAL_TABLET | Freq: Once | ORAL | Status: AC
  Administered 2015-01-26: 1 via ORAL

## 2015-01-26 MED ORDER — LIDOCAINE HCL (PF) 1 % IJ SOLN
INTRAMUSCULAR | Status: AC
  Filled 2015-01-26: qty 30

## 2015-01-26 MED ORDER — PHENYLEPHRINE HCL 10 MG/ML IJ SOLN
10.0000 mg | INTRAVENOUS | Status: DC | PRN
  Administered 2015-01-26: 40 ug/min via INTRAVENOUS

## 2015-01-26 MED ORDER — FENTANYL CITRATE (PF) 250 MCG/5ML IJ SOLN
INTRAMUSCULAR | Status: AC
  Filled 2015-01-26: qty 5

## 2015-01-26 MED ORDER — PROMETHAZINE HCL 25 MG/ML IJ SOLN: 6.2500 mg | INTRAMUSCULAR | Status: DC | PRN

## 2015-01-26 MED ORDER — FENTANYL CITRATE (PF) 100 MCG/2ML IJ SOLN
INTRAMUSCULAR | Status: DC | PRN
  Administered 2015-01-26: 25 ug via INTRAVENOUS
  Administered 2015-01-26: 50 ug via INTRAVENOUS
  Administered 2015-01-26: 25 ug via INTRAVENOUS

## 2015-01-26 MED ORDER — HYDROMORPHONE HCL 1 MG/ML IJ SOLN: 0.2500 mg | INTRAMUSCULAR | Status: DC | PRN

## 2015-01-26 MED ORDER — SODIUM CHLORIDE 0.9 % IV SOLN
INTRAVENOUS | Status: DC
  Administered 2015-01-26 (×2): via INTRAVENOUS

## 2015-01-26 MED ORDER — PROPOFOL 10 MG/ML IV BOLUS
INTRAVENOUS | Status: DC | PRN
  Administered 2015-01-26: 180 mg via INTRAVENOUS

## 2015-01-26 MED ORDER — CEFAZOLIN SODIUM-DEXTROSE 2-3 GM-% IV SOLR
INTRAVENOUS | Status: DC | PRN
  Administered 2015-01-26: 2 g via INTRAVENOUS

## 2015-01-26 MED ORDER — CHLORHEXIDINE GLUCONATE CLOTH 2 % EX PADS: 6.0000 | MEDICATED_PAD | Freq: Once | CUTANEOUS | Status: DC

## 2015-01-26 MED ORDER — DEXTROSE 5 % IV SOLN
INTRAVENOUS | Status: DC | PRN
  Administered 2015-01-26: 14:00:00 via INTRAVENOUS

## 2015-01-26 MED ORDER — MIDAZOLAM HCL 2 MG/2ML IJ SOLN
INTRAMUSCULAR | Status: AC
  Filled 2015-01-26: qty 2

## 2015-01-26 MED ORDER — CEFAZOLIN SODIUM-DEXTROSE 2-3 GM-% IV SOLR
INTRAVENOUS | Status: AC
  Filled 2015-01-26: qty 50

## 2015-01-26 MED ORDER — MIDAZOLAM HCL 5 MG/5ML IJ SOLN
INTRAMUSCULAR | Status: DC | PRN
  Administered 2015-01-26: 1 mg via INTRAVENOUS

## 2015-01-26 MED ORDER — LIDOCAINE HCL (CARDIAC) 20 MG/ML IV SOLN
INTRAVENOUS | Status: DC | PRN
  Administered 2015-01-26: 80 mg via INTRAVENOUS

## 2015-01-26 MED ORDER — PROPOFOL 10 MG/ML IV BOLUS
INTRAVENOUS | Status: AC
  Filled 2015-01-26: qty 20

## 2015-01-26 MED ORDER — ONDANSETRON HCL 4 MG/2ML IJ SOLN
INTRAMUSCULAR | Status: DC | PRN
  Administered 2015-01-26: 4 mg via INTRAVENOUS

## 2015-01-26 MED ORDER — ONDANSETRON HCL 4 MG/2ML IJ SOLN
INTRAMUSCULAR | Status: AC
  Filled 2015-01-26: qty 2

## 2015-01-26 MED ORDER — 0.9 % SODIUM CHLORIDE (POUR BTL) OPTIME
TOPICAL | Status: DC | PRN
  Administered 2015-01-26: 1000 mL

## 2015-01-26 MED ORDER — THROMBIN 20000 UNITS EX SOLR
CUTANEOUS | Status: AC
  Filled 2015-01-26: qty 20000

## 2015-01-26 MED ORDER — HYDROCODONE-ACETAMINOPHEN 5-325 MG PO TABS
ORAL_TABLET | ORAL | Status: AC
  Filled 2015-01-26: qty 1

## 2015-01-26 MED ORDER — LIDOCAINE HCL (CARDIAC) 20 MG/ML IV SOLN
INTRAVENOUS | Status: AC
  Filled 2015-01-26: qty 5

## 2015-01-26 SURGICAL SUPPLY — 34 items
CANISTER SUCTION 2500CC (MISCELLANEOUS) ×2 IMPLANT
CLIP TI MEDIUM 6 (CLIP) ×2 IMPLANT
CLIP TI WIDE RED SMALL 6 (CLIP) ×2 IMPLANT
COVER PROBE W GEL 5X96 (DRAPES) IMPLANT
DECANTER SPIKE VIAL GLASS SM (MISCELLANEOUS) ×2 IMPLANT
DRAIN PENROSE 1/2X12 LTX STRL (WOUND CARE) IMPLANT
DRSG COVADERM 4X8 (GAUZE/BANDAGES/DRESSINGS) ×2 IMPLANT
ELECT REM PT RETURN 9FT ADLT (ELECTROSURGICAL) ×2
ELECTRODE REM PT RTRN 9FT ADLT (ELECTROSURGICAL) ×1 IMPLANT
GLOVE BIO SURGEON STRL SZ 6.5 (GLOVE) ×10 IMPLANT
GLOVE BIO SURGEON STRL SZ7 (GLOVE) ×2 IMPLANT
GLOVE BIOGEL PI IND STRL 6.5 (GLOVE) ×3 IMPLANT
GLOVE BIOGEL PI IND STRL 7.5 (GLOVE) ×1 IMPLANT
GLOVE BIOGEL PI INDICATOR 6.5 (GLOVE) ×3
GLOVE BIOGEL PI INDICATOR 7.5 (GLOVE) ×1
GOWN STRL REUS W/ TWL LRG LVL3 (GOWN DISPOSABLE) ×3 IMPLANT
GOWN STRL REUS W/TWL LRG LVL3 (GOWN DISPOSABLE) ×3
KIT BASIN OR (CUSTOM PROCEDURE TRAY) ×2 IMPLANT
KIT ROOM TURNOVER OR (KITS) ×2 IMPLANT
LIQUID BAND (GAUZE/BANDAGES/DRESSINGS) ×2 IMPLANT
NS IRRIG 1000ML POUR BTL (IV SOLUTION) ×2 IMPLANT
PACK CV ACCESS (CUSTOM PROCEDURE TRAY) ×2 IMPLANT
PAD ARMBOARD 7.5X6 YLW CONV (MISCELLANEOUS) ×4 IMPLANT
SPONGE SURGIFOAM ABS GEL 100 (HEMOSTASIS) IMPLANT
STAPLER VISISTAT 35W (STAPLE) ×2 IMPLANT
SUT MNCRL AB 4-0 PS2 18 (SUTURE) ×2 IMPLANT
SUT PROLENE 6 0 BV (SUTURE) ×2 IMPLANT
SUT PROLENE 7 0 BV 1 (SUTURE) ×4 IMPLANT
SUT SILK 4 0 (SUTURE) ×1
SUT SILK 4-0 18XBRD TIE 12 (SUTURE) ×1 IMPLANT
SUT VIC AB 3-0 SH 27 (SUTURE) ×3
SUT VIC AB 3-0 SH 27X BRD (SUTURE) ×3 IMPLANT
UNDERPAD 30X30 INCONTINENT (UNDERPADS AND DIAPERS) ×2 IMPLANT
WATER STERILE IRR 1000ML POUR (IV SOLUTION) ×2 IMPLANT

## 2015-01-26 NOTE — Transfer of Care (Signed)
Immediate Anesthesia Transfer of Care Note  Patient: Jordan [name]  Procedure(s) Performed: Procedure(s): Right BRACHIOCEPHALIC ARTERIOVENOUS FISTULA SUPERFICIALIZATION with side branch ligation (Right)  Patient Location: PACU  Anesthesia Type:General  Level of Consciousness: awake, alert  and patient cooperative  Airway & Oxygen Therapy: Patient Spontanous Breathing and Patient connected to nasal cannula oxygen  Post-op Assessment: Report given to RN, Post -op Vital signs reviewed and stable and Patient moving all extremities  Post vital signs: Reviewed and stable  Last Vitals:  Filed Vitals:   01/26/15 1524  BP:   Pulse:   Temp: 37.1 C  Resp:     Complications: No apparent anesthesia complications

## 2015-01-26 NOTE — Anesthesia Postprocedure Evaluation (Signed)
  Anesthesia Post-op Note  Patient: Jordan [name]  Procedure(s) Performed: Procedure(s): Right BRACHIOCEPHALIC ARTERIOVENOUS FISTULA SUPERFICIALIZATION with side branch ligation (Right)  Patient Location: PACU  Anesthesia Type:General  Level of Consciousness: awake  Airway and Oxygen Therapy: Patient Spontanous Breathing  Post-op Pain: mild  Post-op Assessment: Post-op Vital signs reviewed              Post-op Vital Signs: Reviewed  Last Vitals:  Filed Vitals:   01/26/15 1614  BP: 116/36  Pulse: 68  Temp: 36.5 C  Resp: 14    Complications: No apparent anesthesia complications

## 2015-01-26 NOTE — Anesthesia Procedure Notes (Signed)
Procedure Name: LMA Insertion Date/Time: 01/26/2015 1:32 PM Performed by: Williemae Area B Pre-anesthesia Checklist: Patient identified, Emergency Drugs available, Suction available and Patient being monitored Patient Re-evaluated:Patient Re-evaluated prior to inductionOxygen Delivery Method: Circle system utilized Preoxygenation: Pre-oxygenation with 100% oxygen Intubation Type: IV induction Ventilation: Mask ventilation without difficulty LMA: LMA inserted LMA Size: 4.0 Number of attempts: 2 Placement Confirmation: positive ETCO2 and breath sounds checked- equal and bilateral Tube secured with: Tape (taped across cheeks) Dental Injury: Teeth and Oropharynx as per pre-operative assessment

## 2015-01-26 NOTE — Anesthesia Preprocedure Evaluation (Addendum)
Anesthesia Evaluation  Patient identified by MRN, date of birth, ID band Patient awake    Reviewed: Allergy & Precautions, NPO status , Patient's Chart, lab work & pertinent test results, reviewed documented beta blocker date and time   Airway Mallampati: II  TM Distance: >3 FB Neck ROM: Full    Dental  (+) Chipped, Dental Advisory Given, Missing, Teeth Intact   Pulmonary shortness of breath, sleep apnea , COPD,    breath sounds clear to auscultation       Cardiovascular hypertension, Pt. on medications and Pt. on home beta blockers + Past MI and +CHF  + dysrhythmias  Rhythm:Regular Rate:Normal  Coreg at 10 a.m. today   Neuro/Psych    GI/Hepatic GERD  Medicated and Controlled,  Endo/Other  diabetes, Well Controlled, Type 2, Insulin Dependent  Renal/GU ESRF and DialysisRenal diseaseDiatek type catheter right chest     Musculoskeletal   Abdominal   Peds  Hematology   Anesthesia Other Findings Chipped right front upper tooth; pt denies any loose teeth  Reproductive/Obstetrics                          Anesthesia Physical Anesthesia Plan  ASA: III  Anesthesia Plan: General   Post-op Pain Management:    Induction: Intravenous  Airway Management Planned: LMA  Additional Equipment:   Intra-op Plan:   Post-operative Plan: Extubation in OR  Informed Consent: I have reviewed the patients History and Physical, chart, labs and discussed the procedure including the risks, benefits and alternatives for the proposed anesthesia with the patient or authorized representative who has indicated his/her understanding and acceptance.   Dental advisory given  Plan Discussed with: CRNA and Anesthesiologist  Anesthesia Plan Comments:         Anesthesia Quick Evaluation

## 2015-01-26 NOTE — Discharge Instructions (Signed)
° ° °  01/26/2015 Jordan [name] [id number] [birth year]  Surgeon(s): Conrad [place], MD  Procedure(s): Right BRACHIOCEPHALIC ARTERIOVENOUS FISTULA SUPERFICIALIZATION with side branch ligation  x Do not stick fistula for 4 weeks

## 2015-01-26 NOTE — Interval H&P Note (Signed)
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 After consideration of risks, benefits and other options for treatment, the patient has consented to  Procedure(s): BRACHIOCEPHALIC ARTERIOVENOUS FISTULA SUPERFICIALIZATION (Right) as a surgical intervention .  The patient's history has been reviewed, patient examined, no change in status, stable for surgery.  I have reviewed the patient's chart and labs.  Questions were answered to the patient's satisfaction.     Adele Barthel

## 2015-01-26 NOTE — H&P (View-Only) (Signed)
Postoperative Access Visit   History of Present Illness  Jordan [name] is a 74 y.o. year old female who presents for postoperative follow-up for: R BC AVF (Date: 10/26/14).  The patient sent for follow up.  The dialysis center is having difficulties cannulating this patient.  They were able to run once for ~1.5 hours.  The patient notes no steal symptoms.  The patient is able to complete their activities of daily living.  The patient's current symptoms are: none.  Past Medical History  Diagnosis Date  . Breast cancer ([place])   . Diabetes mellitus with ESRD (end-stage renal disease) ([place])   . Hypertension   . Arthritis   . COPD (chronic obstructive pulmonary disease) ([place])   . Renal disorder     Family reports acute renal failure  . GERD (gastroesophageal reflux disease)   . CKD (chronic kidney disease)   . Systolic CHF, chronic ([place]) 12/23/2014    Past Surgical History  Procedure Laterality Date  . Breast surgery Left   . Cardiac catheterization [name] 10/19/2014    Procedure: Right/Left Heart Cath and Coronary Angiography;  Surgeon: Peter M Martinique, MD;  Location: [facility];  Service: Cardiovascular;  Laterality: [name];  . Av fistula placement Right 10/26/2014    Procedure: Right Brachiocephalic Arteriovenous FISTULA CREATION;  Surgeon: Conrad Chandler, MD;  Location: [facility];  Service: Vascular;  Laterality: Right;  . Tee without cardioversion [name] 12/27/2014    Procedure: TRANSESOPHAGEAL ECHOCARDIOGRAM (TEE);  Surgeon: Thayer Headings, MD;  Location: [facility];  Service: Cardiovascular;  Laterality: [name];    Social History   Social History  . Marital Status: Single    Spouse Name: [name]  . Number of Children: [name]  . Years of Education: [name]   Occupational History  . Not on file.   Social History Main Topics  . Smoking status: Never Smoker   . Smokeless tobacco: Not on file  . Alcohol Use: No  . Drug Use: Not on file  . Sexual Activity: Not on file   Other Topics  Concern  . Not on file   Social History Narrative    Family History  Problem Relation Age of Onset  . Diabetes Mother   . Hypertension Mother   . Stroke Sister   . Hypertension Sister   . Heart attack Neg Hx     Current Outpatient Prescriptions  Medication Sig Dispense Refill  . albuterol (PROVENTIL HFA;VENTOLIN HFA) 108 (90 BASE) MCG/ACT inhaler Inhale 1-2 puffs into the lungs every 6 (six) hours as needed for wheezing or shortness of breath.    . carvedilol (COREG) 6.25 MG tablet Take 1 tablet (6.25 mg total) by mouth 2 (two) times daily with a meal. 60 tablet 1  . gabapentin (NEURONTIN) 300 MG capsule Take 1 capsule (300 mg total) by mouth at bedtime. 60 capsule 6  . HYDROcodone-acetaminophen (NORCO/VICODIN) 5-325 MG per tablet Take 1 tablet by mouth every 6 (six) hours as needed for moderate pain.    . Insulin Detemir (LEVEMIR FLEXPEN) 100 UNIT/ML Pen Inject 55 Units into the skin every morning. 15 mL 11  . insulin lispro (HUMALOG KWIKPEN) 100 UNIT/ML KiwkPen Inject 1-5 Units into the skin 3 (three) times daily as needed (blood sugar).     . Multiple Vitamins-Minerals (CENTRUM SILVER ADULT 50+ PO) Take 1 tablet by mouth daily.    . Zinc Oxide 13 % CREA Apply 1 application topically 3 (three) times daily. To buttock cleft 136  g 1  . vancomycin in sodium chloride 0.9 % 100 mL Patient will need IV vancomycin during hemodialysis for enterococcus bacteremia for next 2 weeks start date 12/29/2014, this is to be dosed and manage by nephrology at hemodialysis center (Patient not taking: Reported on 01/21/2015)     No current facility-administered medications for this visit.     No Known Allergies   REVIEW OF SYSTEMS:  (Positives checked otherwise negative)  CARDIOVASCULAR:   [ ]  chest pain,  [ ]  chest pressure,  [ ]  palpitations,  [ ]  shortness of breath when laying flat,  [ ]  shortness of breath with exertion,   [ ]  pain in feet when walking,  [ ]  pain in feet when laying  flat, [ ]  history of blood clot in veins (DVT),  [ ]  history of phlebitis,  [ ]  swelling in legs,  [ ]  varicose veins  PULMONARY:   [ ]  productive cough,  [ ]  asthma,  [ ]  wheezing  NEUROLOGIC:   [ ]  weakness in arms or legs,  [ ]  numbness in arms or legs,  [ ]  difficulty speaking or slurred speech,  [ ]  temporary loss of vision in one eye,  [ ]  dizziness  HEMATOLOGIC:   [ ]  bleeding problems,  [ ]  problems with blood clotting too easily  MUSCULOSKEL:   [ ]  joint pain, [ ]  joint swelling  GASTROINTEST:   [ ]  vomiting blood,  [ ]  blood in stool     GENITOURINARY:   [ ]  burning with urination,  [ ]  blood in urine [x]  ESRD-HD: T-R-S  PSYCHIATRIC:   [ ]  history of major depression  INTEGUMENTARY:   [ ]  rashes,  [ ]  ulcers  CONSTITUTIONAL:   [ ]  fever,  [ ]  chills    Physical Examination Filed Vitals:   01/21/15 1417  BP: 112/48  Pulse: 70  Temp: 98 F (36.7 C)  Resp: 18   Body mass index is 44.43 kg/(m^2).  Pulmonary: Sym exp, good air movt, CTAB, no rales, rhonchi, & wheezing  Cardiac: RRR, Nl S1, S2, no Murmurs, rubs or gallops  RUE: Incision is healed, skin feels warm, hand grip is 5/5, sensation in digits is intact, palpable thrill in distal 1/3, cannot feel in proximal 2/3, somewhat pulsatile at 2/3 position, bruit can be auscultated    Medical Decision Making  Jordan [name] is a 74 y.o. year old female who presents s/p R BC AVF.   Not too surprise with difficulty given this patient's obesity.  Given the fistula is more pulsatile than previously, would obtain a R arm fistulogram to verify no interval development of venous stenosis prior to proceeding with superficialization of R BC AVF.  This fistulogram is setup for this coming Monday.  If the fistula is widely patent, will try to schedule superficialization this coming Wednesday. I discussed with the patient the nature of angiographic procedures, especially the limited patencies  of any endovascular intervention.  The patient is aware of that the risks of an angiographic procedure include but are not limited to: bleeding, infection, access site complications, renal failure, embolization, rupture of vessel, dissection, possible need for emergent surgical intervention, possible need for surgical procedures to treat the patient's pathology, anaphylactic reaction to contrast, and stroke and death.   The patient is aware of the risks and agrees to proceed.  Thank you for allowing [place] to participate in this patient's care.  Adele Barthel, MD [facility]  Office: [phone] Pager: [phone]  01/21/2015, 2:41 PM

## 2015-01-26 NOTE — Op Note (Signed)
    OPERATIVE NOTE   PROCEDURE: 1. Superficialization of right brachiocephalic arteriovenous fistula   2. Ligation of side branches x 15  PRE-OPERATIVE DIAGNOSIS: deep right brachiocephalic arteriovenous fistula    POST-OPERATIVE DIAGNOSIS: same as above   SURGEON: Adele Barthel, MD  ASSISTANT(S): Leontine Locket, PAC   ANESTHESIA: general  ESTIMATED BLOOD LOSS: 100 cc  FINDING(S): 1. Inflammation subcutaneous tissue surrounding 75% of fistula. 2. Distal 1/2 fistula >6 mm, proximal 1/2: 5-6 mm in diameter 3. Distal < 6 mm deep, proximally >4 cm deep 4. Short sclerotic mid-segment improved diameter with lysis perivenous scar tissue 5. Easily palpable thrill in transposed fistula  SPECIMEN(S):  none  INDICATIONS:   Jordan [name] is a 74 y.o. female who presents with deep right brachiocephalic arteriovenous fistula.  We discussed that superficialization of the fistula was necessary to facilitate future cannulation of the fistula.  In the process, ligation of side branches would be completed.  Risk, benefits, and alternatives to access surgery were discussed.  The patient is aware the risks include but are not limited to: bleeding, infection, steal syndrome, nerve damage, ischemic monomelic neuropathy, failure to mature, need for additional procedures, death and stroke.  The patient agrees to proceed forward with the procedure.  DESCRIPTION: After obtain full informed written consent, the patient was brought back to the operating room and placed supine upon the operating table.  The patient received IV antibiotics prior to induction.  After obtaining adequate anesthesia, the patient was prepped and draped in the standard fashion for: right access procedure.  The brachiocephalic fistula was identified with the Sonosite and marked on the arm.  I made a longitudinal incision over the fistula and dissected down to the fistula.  I mobilized the fistula from the anastomosis to the  axillary extent of the fistula, in the process ligating 15 side branches with silk ties.  This was extremely difficult due to extensive inflammation surrounding the fistula.  Findings are listed above.     I sharply lysed connective tissue surrounding the fistula.  I made a shelf in the adjacent subcutaneous tissue that was ~6 mm deep from the skin.  I transposed the fistula onto this shelf and secured the fistula in placed with interrupted 3-0 Vicryl stitches, taking care to avoid constricting the fistula.  The deep subcutaneous tissue was reapproximated with interrupted 3-0 Vicryl to abolish some of the dead space.  A running subcutaneous stitch of 3-0 Vicryl was used to reapproximated the superficial subcutaneous tissue.  The skin was then reapproximated with staples due to the depth of fat in this arm. The skin was cleaned, dried, and the skin closure dressed with Coverderm.  There was an easily palpable thrill at the end of this case.   COMPLICATIONS: none  CONDITION: stable   Adele Barthel, MD [facility] Office: [phone] Pager: [phone]  01/26/2015, 3:15 PM

## 2015-01-27 ENCOUNTER — Encounter (HOSPITAL_COMMUNITY): Payer: Self-pay | Admitting: Vascular Surgery

## 2015-01-28 ENCOUNTER — Telehealth: Payer: Self-pay | Admitting: Vascular Surgery

## 2015-01-28 NOTE — Telephone Encounter (Signed)
LM for pt re appt, dpm °

## 2015-01-28 NOTE — Telephone Encounter (Signed)
-----   Message from Gabriel Earing, [place] sent at 01/26/2015  3:27 PM EDT ----- S/p superficialization of right arm fistula.  F/u with Dr. Bridgett Larsson in 2 weeks.  (will need staple removal at that time).  Thanks, Aldona Bar

## 2015-02-04 ENCOUNTER — Encounter: Payer: Self-pay | Admitting: Vascular Surgery

## 2015-02-04 ENCOUNTER — Ambulatory Visit: Payer: Medicare (Managed Care) | Admitting: Vascular Surgery

## 2015-02-07 ENCOUNTER — Encounter: Payer: Self-pay | Admitting: Nephrology

## 2015-02-09 ENCOUNTER — Ambulatory Visit (INDEPENDENT_AMBULATORY_CARE_PROVIDER_SITE_OTHER): Payer: Medicare (Managed Care) | Admitting: Vascular Surgery

## 2015-02-09 ENCOUNTER — Encounter: Payer: Self-pay | Admitting: Vascular Surgery

## 2015-02-09 VITALS — BP 107/59 | HR 72 | Temp 97.2°F | Resp 16 | Ht 65.0 in | Wt 271.0 lb

## 2015-02-09 DIAGNOSIS — Z992 Dependence on renal dialysis: Secondary | ICD-10-CM

## 2015-02-09 DIAGNOSIS — N186 End stage renal disease: Secondary | ICD-10-CM

## 2015-02-09 NOTE — Progress Notes (Signed)
    Postoperative Access Visit   History of Present Illness  Jordan [name] is a 74 y.o. year old female who presents for postoperative follow-up for: superficialization of R BC AVF, SBL x 15 (Date: 01/26/15).  The patient's wounds are healed.  The patient notes no steal symptoms.  The patient is able to complete their activities of daily living.  The patient's current symptoms are: none.  For VQI Use Only  PRE-ADM LIVING: Home  AMB STATUS: Ambulatory  Physical Examination Filed Vitals:   02/09/15 1032  BP: 107/59  Pulse: 72  Temp: 97.2 F (36.2 C)  Resp: 16    RUE: Incision is healed, skin feels warm, hand grip is 5/5, sensation in digits is intact, palpable thrill, bruit can be auscultated, staples in place  Medical Decision Making  Jordan [name] is a 73 y.o. year old female who presents s/p R BC AVF superficialization with SBL .  Pt will return in 2 weeks for wound check prior to giving the ok to proceed with use given the large scale dissection needed in this case.  Thank you for allowing [place] to participate in this patient's care.  Adele Barthel, MD [facility] [place] Office: [phone] Pager: [phone]  02/09/2015, 10:55 AM

## 2015-02-11 ENCOUNTER — Encounter: Payer: Medicare (Managed Care) | Admitting: Vascular Surgery

## 2015-02-16 ENCOUNTER — Encounter: Payer: Medicare (Managed Care) | Admitting: Occupational Therapy

## 2015-02-21 ENCOUNTER — Ambulatory Visit: Payer: Medicare (Managed Care) | Attending: Family Medicine | Admitting: Occupational Therapy

## 2015-02-21 DIAGNOSIS — I972 Postmastectomy lymphedema syndrome: Secondary | ICD-10-CM | POA: Diagnosis present

## 2015-02-21 NOTE — Therapy (Signed)
[facility] [address] [place], [address] Phone: [phone]   Fax:  [phone]  Occupational Therapy Treatment  Patient Details  Name: Jordan [name] MRN: [id number] Date of Birth: 09-03-1940 No Data Recorded  Encounter Date: 02/21/2015      OT End of Session - 02/21/15 1206    Visit Number 2   Number of Visits 8   Date for OT Re-Evaluation 03/21/15   OT Start Time 1046   OT Stop Time 1120   OT Time Calculation (min) 34 min   Activity Tolerance Patient tolerated treatment well;Patient limited by fatigue   Behavior During Therapy [facility] for tasks assessed/performed      Past Medical History  Diagnosis Date  . Breast cancer ([place])   . Diabetes mellitus with ESRD (end-stage renal disease) ([place])   . Hypertension   . Arthritis   . COPD (chronic obstructive pulmonary disease) ([place])   . Renal disorder     Family reports acute renal failure  . GERD (gastroesophageal reflux disease)   . CKD (chronic kidney disease)   . Systolic CHF, chronic ([place]) 12/23/2014    Past Surgical History  Procedure Laterality Date  . Breast surgery Left   . Cardiac catheterization [name] 10/19/2014    Procedure: Right/Left Heart Cath and Coronary Angiography;  Surgeon: Peter M Martinique, MD;  Location: [place] [facility];  Service: Cardiovascular;  Laterality: [name];  . Av fistula placement Right 10/26/2014    Procedure: Right Brachiocephalic Arteriovenous FISTULA CREATION;  Surgeon: Conrad [place], MD;  Location: [place];  Service: Vascular;  Laterality: Right;  . Tee without cardioversion [name] 12/27/2014    Procedure: TRANSESOPHAGEAL ECHOCARDIOGRAM (TEE);  Surgeon: Thayer Headings, MD;  Location: [place];  Service: Cardiovascular;  Laterality: [name];  . Peripheral vascular catheterization [name] 01/24/2015    Procedure: Fistulagram;  Surgeon: Conrad [place], MD;  Location: [facility] [facility];  Service: Cardiovascular;  Laterality: [name];   . Fistula superficialization Right 01/26/2015    Procedure: Right BRACHIOCEPHALIC ARTERIOVENOUS FISTULA SUPERFICIALIZATION with side branch ligation;  Surgeon: Conrad , MD;  Location: [facility];  Service: Vascular;  Laterality: Right;    There were no vitals filed for this visit.  Visit Diagnosis:  Postmastectomy lymphedema syndrome - Plan: Ot plan of care cert/re-cert      Subjective Assessment - 02/21/15 1159    Subjective  I had surgery 10/12 on my R arm for the fistula. I was wearing the glove and sleeve for my  L hand and forearm at home and night time but it is stretch out , I think it is doing okay   Patient Stated Goals I do not want my arm to get worse , or get infection   Currently in Pain? No/denies             LYMPHEDEMA/ONCOLOGY QUESTIONNAIRE - 02/21/15 1058    Left Upper Extremity Lymphedema   15 cm Proximal to Olecranon Process 54 cm   10 cm Proximal to Olecranon Process 48 cm   Olecranon Process 29.5 cm   15 cm Proximal to Ulnar Styloid Process 30.5 cm   10 cm Proximal to Ulnar Styloid Process 28 cm   Just Proximal to Ulnar Styloid Process 22 cm   Across Hand at [employer] 22.3 cm   At [place] of 2nd Digit 8 cm   At [facility] of Thumb 7.9 cm  OT Treatments/Exercises (OP) - 02/21/15 0001    ADLs   ADL Comments Pt fitted with new isotoner glove small for L hand and tubi grip E for hand and forearm (2) to use during day and night time - will order her Jovipak for trunkle lymphedema    Elbow Exercises   Other elbow exercises Pt to do AROM of shoulder flexion over head 10 reps , ABD (snow angles)  10 reps each  - measured upon arrival - flexion 110  and abd  90 (same)    Manual Therapy   Manual therapy comments Circumference of L UE measured - see flowsheet- pt decreased in  hand, wrist , elbow and upper arm                 OT Education - 02/21/15 1205    Education provided Yes   Education Details HEP    Person(s) Educated  Patient   Methods Explanation;Demonstration;Tactile cues;Verbal cues   Comprehension Verbal cues required;Returned demonstration;Verbalized understanding          OT Short Term Goals - 02/21/15 1307    OT SHORT TERM GOAL #1   Title L UE circumference decreaes by 1 cm at hand thru elbow , 0.5 at digits to be fitted with compression sleeve/glove for daytime use    Time 3   Period Weeks   Status On-going           OT Long Term Goals - 02/21/15 1307    OT LONG TERM GOAL #1   Title Pt and family be ind in donning appriate compression garments to decrease and maintain  L UE lymphedema to decrease risk for infection    Time 4   Period Weeks   Status On-going               Plan - 02/21/15 1207    Clinical Impression Statement Pt was evaluated about month ago - but then she had some vascular surgery 10/12 - pt return this date after using some compression on distal L hand and forearm - pt did decrease some - pt was provided with new isotoner glove and tubi grip - will reassess in 10  to 14 day s and at that time will assess is she is appropriate for daytime compression sleeve - because of her kidneys and cardiac status - pt did walk in this date- was not in w/c - and report having less pain in L arm -  hand , wrist , elbow and upper arm did decreasein circumference  - pt still with trunkle lymphedema under arm -  will get her jovipak breast pad to use at home to decrease congestion     Pt will benefit from skilled therapeutic intervention in order to improve on the following deficits (Retired) Impaired UE functional use;Impaired sensation;Impaired flexibility;Decreased strength;Decreased range of motion;Decreased activity tolerance;Increased edema   Rehab Potential Good   OT Frequency 1x / week   OT Duration 4 weeks   OT Treatment/Interventions Manual lymph drainage;Therapeutic exercise;Passive range of motion;Patient/family education;Self-care/ADL training   Plan reassess  circumference ,  and shoulder ROM    OT Home Exercise Plan see pt instruction   Consulted and Agree with Plan of Care Patient;Family member/caregiver   Family Member Consulted grandson        Problem List Patient Active Problem List   Diagnosis Date Noted  . Loose stools 01/13/2015  . Skin irritation 01/13/2015  . Rib pain on left side 01/13/2015  .  Enterococcal bacteremia 12/26/2014  . Bacteremia   . UTI (lower urinary tract infection) 12/23/2014  . Acute encephalopathy 12/23/2014  . Systolic CHF, chronic ([place]) 12/23/2014  . Encephalopathy, metabolic [id number]  . Lymphedema of left arm 12/08/2014  . OSA (obstructive sleep apnea) 12/08/2014  . Constipation 12/08/2014  . Nonischemic cardiomyopathy ([place]) 11/10/2014  . Acute on chronic systolic heart failure, NYHA class 3 ([place]) 11/10/2014  . Morbidly obese ([place]) 11/10/2014  . ESRD (end stage renal disease) on dialysis ([place])   . Type 2 diabetes mellitus with diabetic nephropathy ([place])   . Essential hypertension   . Left hip pain   . NSTEMI (non-ST elevated myocardial infarction) ([place]) 10/13/2014  . Pulmonary HTN ([place]) 10/13/2014  . Syncope 10/12/2014  . SOB (shortness of breath) 10/12/2014  . Chest pain with high risk for cardiac etiology 10/12/2014  . LBBB (left bundle branch block) 10/12/2014  . Breast cancer ([place])   . Hypertension   . Arthritis   . COPD (chronic obstructive pulmonary disease) ([facility])     Nikitas Davtyan OTR/L,CLT 02/21/2015, 1:10 PM  [facility] [address] [place], [address] Phone: [phone]   Fax:  [phone]  Name: Janesha Daddio [name] MRN: [id number] Date of Birth: 08/20/1940

## 2015-02-21 NOTE — Patient Instructions (Signed)
Cont with E tubi grip for hand and forearm - and isotoner glove to use during day and night time for 10 days to 2 wks  Shoulder flexion AAROM  And ABD shoulder  AROM - in supine - 10 reps - 2 x day

## 2015-02-22 ENCOUNTER — Telehealth: Payer: Self-pay | Admitting: Family Medicine

## 2015-02-22 NOTE — Telephone Encounter (Signed)
Left msg for pt to call the office to schedule AWV/msn

## 2015-02-25 ENCOUNTER — Encounter: Payer: Medicare (Managed Care) | Admitting: Vascular Surgery

## 2015-03-01 ENCOUNTER — Encounter: Payer: Self-pay | Admitting: Vascular Surgery

## 2015-03-04 ENCOUNTER — Encounter: Payer: Self-pay | Admitting: Vascular Surgery

## 2015-03-04 ENCOUNTER — Ambulatory Visit (INDEPENDENT_AMBULATORY_CARE_PROVIDER_SITE_OTHER): Payer: Medicare (Managed Care) | Admitting: Vascular Surgery

## 2015-03-04 ENCOUNTER — Other Ambulatory Visit: Payer: Self-pay

## 2015-03-04 VITALS — BP 123/72 | HR 70 | Temp 98.1°F | Resp 16 | Ht 65.0 in | Wt 268.2 lb

## 2015-03-04 DIAGNOSIS — N186 End stage renal disease: Secondary | ICD-10-CM

## 2015-03-04 DIAGNOSIS — Z992 Dependence on renal dialysis: Secondary | ICD-10-CM

## 2015-03-04 NOTE — Progress Notes (Signed)
Postoperative Access Visit   History of Present Illness  Jordan [name] is a 74 y.o. year old female who presents for follow-up s/p superificialization of right brachiocephalic AV fistula  (Date: 01/26/15).  The patient's wounds are healed.  The patient notes no steal symptoms.  The patient is able to complete their activities of daily living. She is currently dialyzing TTS via a right IJ TDC. She reports that at dialysis she was told that her graft no longer had a "swishing" sound in it about two weeks ago. She says her fistula has not been examined since then. She also notes issues with clotting during dialysis.   Past Medical History  Diagnosis Date  . Breast cancer ([place])   . Diabetes mellitus with ESRD (end-stage renal disease) ([place])   . Hypertension   . Arthritis   . COPD (chronic obstructive pulmonary disease) ([place])   . Renal disorder     Family reports acute renal failure  . GERD (gastroesophageal reflux disease)   . CKD (chronic kidney disease)   . Systolic CHF, chronic ([place]) 12/23/2014    Past Surgical History  Procedure Laterality Date  . Breast surgery Left   . Cardiac catheterization [name] 10/19/2014    Procedure: Right/Left Heart Cath and Coronary Angiography;  Surgeon: Peter M Martinique, MD;  Location: [place] [facility];  Service: Cardiovascular;  Laterality: [name];  . Av fistula placement Right 10/26/2014    Procedure: Right Brachiocephalic Arteriovenous FISTULA CREATION;  Surgeon: Conrad [place], MD;  Location: [place];  Service: Vascular;  Laterality: Right;  . Tee without cardioversion [name] 12/27/2014    Procedure: TRANSESOPHAGEAL ECHOCARDIOGRAM (TEE);  Surgeon: Thayer Headings, MD;  Location: [facility];  Service: Cardiovascular;  Laterality: [name];  . Peripheral vascular catheterization [name] 01/24/2015    Procedure: Fistulagram;  Surgeon: Conrad [place], MD;  Location: [place];  Service: Cardiovascular;  Laterality: [name];  . Fistula superficialization Right  01/26/2015    Procedure: Right BRACHIOCEPHALIC ARTERIOVENOUS FISTULA SUPERFICIALIZATION with side branch ligation;  Surgeon: Conrad Moorland, MD;  Location: [facility] [place];  Service: Vascular;  Laterality: Right;    Social History   Social History  . Marital Status: Single    Spouse Name: [name]  . Number of Children: [name]  . Years of Education: [name]   Occupational History  . Not on file.   Social History Main Topics  . Smoking status: Never Smoker   . Smokeless tobacco: Never Used  . Alcohol Use: No  . Drug Use: No  . Sexual Activity: Not on file   Other Topics Concern  . Not on file   Social History Narrative    Family History  Problem Relation Age of Onset  . Diabetes Mother   . Hypertension Mother   . Stroke Sister   . Hypertension Sister   . Heart attack Neg Hx     Current Outpatient Prescriptions  Medication Sig Dispense Refill  . albuterol (PROVENTIL HFA;VENTOLIN HFA) 108 (90 BASE) MCG/ACT inhaler Inhale 1-2 puffs into the lungs every 6 (six) hours as needed for wheezing or shortness of breath.    . carvedilol (COREG) 6.25 MG tablet Take 1 tablet (6.25 mg total) by mouth 2 (two) times daily with a meal. 60 tablet 1  . gabapentin (NEURONTIN) 300 MG capsule Take 1 capsule (300 mg total) by mouth at bedtime. 60 capsule 6  . Insulin Detemir (LEVEMIR FLEXPEN) 100 UNIT/ML Pen Inject 55 Units into the skin every morning.  15 mL 11  . insulin lispro (HUMALOG KWIKPEN) 100 UNIT/ML KiwkPen Inject 1-5 Units into the skin 3 (three) times daily as needed (blood sugar).     . Multiple Vitamins-Minerals (CENTRUM SILVER ADULT 50+ PO) Take 1 tablet by mouth daily.    Marland Kitchen HYDROcodone-acetaminophen (NORCO/VICODIN) 5-325 MG tablet Take 1 tablet by mouth every 6 (six) hours as needed for moderate pain. (Patient not taking: Reported on 02/09/2015) 30 tablet 0  . vancomycin in sodium chloride 0.9 % 100 mL Patient will need IV vancomycin during hemodialysis for enterococcus bacteremia for next 2 weeks start  date 12/29/2014, this is to be dosed and manage by nephrology at hemodialysis center (Patient not taking: Reported on 03/04/2015)    . Zinc Oxide 13 % CREA Apply 1 application topically 3 (three) times daily. To buttock cleft (Patient not taking: Reported on 03/04/2015) 136 g 1   No current facility-administered medications for this visit.     Allergies  Allergen Reactions  . Vicodin [Hydrocodone-Acetaminophen] Itching    REVIEW OF SYSTEMS:  (Positives checked otherwise negative)  CARDIOVASCULAR:   [ ]  chest pain,  [ ]  chest pressure,  [ ]  palpitations,  [ ]  shortness of breath when laying flat,  [ ]  shortness of breath with exertion,   [ ]  pain in feet when walking,  [ ]  pain in feet when laying flat, [ ]  history of blood clot in veins (DVT),  [ ]  history of phlebitis,  [ ]  swelling in legs,  [ ]  varicose veins  PULMONARY:   [ ]  productive cough,  [ ]  asthma,  [ ]  wheezing  NEUROLOGIC:   [ ]  weakness in arms or legs,  [ ]  numbness in arms or legs,  [ ]  difficulty speaking or slurred speech,  [ ]  temporary loss of vision in one eye,  [ ]  dizziness  HEMATOLOGIC:   [ ]  bleeding problems,  [ ]  problems with blood clotting too easily  MUSCULOSKEL:   [ ]  joint pain, [ ]  joint swelling  GASTROINTEST:   [ ]  vomiting blood,  [ ]  blood in stool     GENITOURINARY:   [ ]  burning with urination,  [ ]  blood in urine  PSYCHIATRIC:   [ ]  history of major depression  INTEGUMENTARY:   [ ]  rashes,  [ ]  ulcers  CONSTITUTIONAL:   [ ]  fever,  [ ]  chills  For VQI Use Only  PRE-ADM LIVING: Home  AMB STATUS: Ambulatory with Assistance  Physical Examination Filed Vitals:   03/04/15 1303  BP: 123/72  Pulse: 70  Temp: 98.1 F (36.7 C)  Resp: 16   Pulmonary: Sym exp, good air movt, CTAB, no rales, rhonchi, & wheezing  Cardiac: RRR, Nl S1, S2, no Murmurs, rubs or gallops  RUE: Incision is healed. Non palpable thrill and non-audible bruit.   Medical Decision  Making  Jordan [name] is a 73 y.o. year old female who presents with clotted right brachiocephalic AV fistula. She is s/p superficialization of her right brachiocephalic AVF on 0000000. She dialyzes via a right IJ TDC on TTS.    Her fistula has been clotted for around two weeks. Will attempt to salvage her fistula. Plan for surgical thrombectomy of her right brachiocephalic AV fistula on 25 Feb 2015 with Dr. Bridgett Larsson.   Thank you for allowing [place] to participate in this patient's care.  Virgina Jock, PA-C [facility] [place] Office: [phone]   03/04/2015, 1:23 PM  Addendum  I have independently interviewed and examined the patient, and I agree with the physician assistant's findings.  Pt's R BC AVF would found to have significant inflammation perivenous intraoperatively.  However, the fistula was patent ~2 weeks ago, so I think its worth trying to salvage this fistula given the likely difficulty in placing other access in this patient.  Will schedule for this coming Monday, 21 NOV 16.  Adele Barthel, MD [facility] [place] Office: [phone] Pager: [phone]  03/04/2015, 1:43 PM

## 2015-03-04 NOTE — Progress Notes (Signed)
Pre-op instructions given only. Please complete  Assessment  DOS. Pt verbalized understanding of insulin instructions, pt stated " I know what to do, I just went through this last month."

## 2015-03-07 ENCOUNTER — Ambulatory Visit (HOSPITAL_COMMUNITY): Payer: Medicare (Managed Care) | Admitting: Certified Registered Nurse Anesthetist

## 2015-03-07 ENCOUNTER — Encounter (HOSPITAL_COMMUNITY)
Admission: RE | Disposition: A | Discharge status: Home / Self Care | Payer: Self-pay | Source: Ambulatory Visit | Attending: Vascular Surgery

## 2015-03-07 ENCOUNTER — Encounter (HOSPITAL_COMMUNITY): Payer: Self-pay | Admitting: *Deleted

## 2015-03-07 ENCOUNTER — Ambulatory Visit (HOSPITAL_COMMUNITY)
Admission: RE | Admit: 2015-03-07 | Discharge: 2015-03-07 | Disposition: A | Discharge status: Home / Self Care | Payer: Medicare (Managed Care) | Source: Ambulatory Visit | Attending: Vascular Surgery | Admitting: Vascular Surgery

## 2015-03-07 ENCOUNTER — Other Ambulatory Visit: Payer: Medicare (Managed Care) | Admitting: *Deleted

## 2015-03-07 DIAGNOSIS — J449 Chronic obstructive pulmonary disease, unspecified: Secondary | ICD-10-CM | POA: Diagnosis not present

## 2015-03-07 DIAGNOSIS — Z794 Long term (current) use of insulin: Secondary | ICD-10-CM | POA: Insufficient documentation

## 2015-03-07 DIAGNOSIS — Z992 Dependence on renal dialysis: Secondary | ICD-10-CM | POA: Diagnosis not present

## 2015-03-07 DIAGNOSIS — Z01810 Encounter for preprocedural cardiovascular examination: Secondary | ICD-10-CM

## 2015-03-07 DIAGNOSIS — I5022 Chronic systolic (congestive) heart failure: Secondary | ICD-10-CM | POA: Diagnosis not present

## 2015-03-07 DIAGNOSIS — M1990 Unspecified osteoarthritis, unspecified site: Secondary | ICD-10-CM | POA: Diagnosis not present

## 2015-03-07 DIAGNOSIS — I132 Hypertensive heart and chronic kidney disease with heart failure and with stage 5 chronic kidney disease, or end stage renal disease: Secondary | ICD-10-CM | POA: Insufficient documentation

## 2015-03-07 DIAGNOSIS — Z853 Personal history of malignant neoplasm of breast: Secondary | ICD-10-CM | POA: Insufficient documentation

## 2015-03-07 DIAGNOSIS — T82868A Thrombosis of vascular prosthetic devices, implants and grafts, initial encounter: Secondary | ICD-10-CM | POA: Insufficient documentation

## 2015-03-07 DIAGNOSIS — N186 End stage renal disease: Secondary | ICD-10-CM | POA: Diagnosis not present

## 2015-03-07 DIAGNOSIS — Y832 Surgical operation with anastomosis, bypass or graft as the cause of abnormal reaction of the patient, or of later complication, without mention of misadventure at the time of the procedure: Secondary | ICD-10-CM | POA: Insufficient documentation

## 2015-03-07 DIAGNOSIS — Z79899 Other long term (current) drug therapy: Secondary | ICD-10-CM | POA: Diagnosis not present

## 2015-03-07 DIAGNOSIS — Z6841 Body Mass Index (BMI) 40.0 and over, adult: Secondary | ICD-10-CM | POA: Insufficient documentation

## 2015-03-07 DIAGNOSIS — E1122 Type 2 diabetes mellitus with diabetic chronic kidney disease: Secondary | ICD-10-CM | POA: Diagnosis not present

## 2015-03-07 DIAGNOSIS — K219 Gastro-esophageal reflux disease without esophagitis: Secondary | ICD-10-CM | POA: Insufficient documentation

## 2015-03-07 HISTORY — PX: THROMBECTOMY W/ EMBOLECTOMY: SHX2507

## 2015-03-07 HISTORY — PX: LIGATION OF ARTERIOVENOUS  FISTULA: SHX5948

## 2015-03-07 LAB — POCT I-STAT 4, (NA,K, GLUC, HGB,HCT)
Glucose, Bld: 139 mg/dL — ABNORMAL HIGH (ref 65–99)
HCT: 39 % (ref 36.0–46.0)
HEMOGLOBIN: 13.3 g/dL (ref 12.0–15.0)
Potassium: 4.9 mmol/L (ref 3.5–5.1)
SODIUM: 134 mmol/L — AB (ref 135–145)

## 2015-03-07 LAB — GLUCOSE, CAPILLARY
GLUCOSE-CAPILLARY: 112 mg/dL — AB (ref 65–99)
Glucose-Capillary: 135 mg/dL — ABNORMAL HIGH (ref 65–99)

## 2015-03-07 SURGERY — THROMBECTOMY ARTERIOVENOUS FISTULA
Anesthesia: Monitor Anesthesia Care | Site: Arm Upper | Laterality: Right

## 2015-03-07 MED ORDER — ONDANSETRON HCL 4 MG/2ML IJ SOLN
INTRAMUSCULAR | Status: DC | PRN
  Administered 2015-03-07: 4 mg via INTRAVENOUS

## 2015-03-07 MED ORDER — EPHEDRINE SULFATE 50 MG/ML IJ SOLN
INTRAMUSCULAR | Status: DC | PRN
  Administered 2015-03-07: 10 mg via INTRAVENOUS
  Administered 2015-03-07: 5 mg via INTRAVENOUS

## 2015-03-07 MED ORDER — SODIUM CHLORIDE 0.9 % IV SOLN
INTRAVENOUS | Status: DC | PRN
  Administered 2015-03-07: 500 mL

## 2015-03-07 MED ORDER — THROMBIN 20000 UNITS EX SOLR
CUTANEOUS | Status: AC
  Filled 2015-03-07: qty 20000

## 2015-03-07 MED ORDER — LIDOCAINE HCL (PF) 1 % IJ SOLN
INTRAMUSCULAR | Status: AC
  Filled 2015-03-07: qty 30

## 2015-03-07 MED ORDER — SODIUM CHLORIDE 0.9 % IR SOLN
Status: DC | PRN
  Administered 2015-03-07: 1000 mL

## 2015-03-07 MED ORDER — PROMETHAZINE HCL 25 MG/ML IJ SOLN
6.2500 mg | INTRAMUSCULAR | Status: DC | PRN
Start: 2015-03-07 — End: 2015-03-07

## 2015-03-07 MED ORDER — SODIUM CHLORIDE 0.9 % IV SOLN
INTRAVENOUS | Status: DC
  Administered 2015-03-07: 09:00:00 via INTRAVENOUS

## 2015-03-07 MED ORDER — SODIUM CHLORIDE 0.9 % IV SOLN: INTRAVENOUS | Status: DC

## 2015-03-07 MED ORDER — PROPOFOL 10 MG/ML IV BOLUS
INTRAVENOUS | Status: AC
  Filled 2015-03-07: qty 20

## 2015-03-07 MED ORDER — HYDROCODONE-ACETAMINOPHEN 5-325 MG PO TABS: 1.0000 | ORAL_TABLET | Freq: Four times a day (QID) | ORAL | Status: DC | PRN

## 2015-03-07 MED ORDER — PROPOFOL 500 MG/50ML IV EMUL
INTRAVENOUS | Status: DC | PRN
  Administered 2015-03-07: 40 ug/kg/min via INTRAVENOUS

## 2015-03-07 MED ORDER — LIDOCAINE HCL (PF) 1 % IJ SOLN
INTRAMUSCULAR | Status: DC | PRN
  Administered 2015-03-07: 30 mL via INTRADERMAL

## 2015-03-07 MED ORDER — FENTANYL CITRATE (PF) 100 MCG/2ML IJ SOLN: 25.0000 ug | INTRAMUSCULAR | Status: DC | PRN

## 2015-03-07 MED ORDER — LIDOCAINE HCL (CARDIAC) 20 MG/ML IV SOLN
INTRAVENOUS | Status: DC | PRN
  Administered 2015-03-07: 80 mg via INTRAVENOUS

## 2015-03-07 MED ORDER — FENTANYL CITRATE (PF) 250 MCG/5ML IJ SOLN
INTRAMUSCULAR | Status: AC
  Filled 2015-03-07: qty 5

## 2015-03-07 MED ORDER — FENTANYL CITRATE (PF) 100 MCG/2ML IJ SOLN
INTRAMUSCULAR | Status: DC | PRN
  Administered 2015-03-07: 50 ug via INTRAVENOUS

## 2015-03-07 MED ORDER — MIDAZOLAM HCL 2 MG/2ML IJ SOLN: 0.5000 mg | Freq: Once | INTRAMUSCULAR | Status: DC | PRN

## 2015-03-07 MED ORDER — MEPERIDINE HCL 25 MG/ML IJ SOLN: 6.2500 mg | INTRAMUSCULAR | Status: DC | PRN

## 2015-03-07 MED ORDER — CHLORHEXIDINE GLUCONATE CLOTH 2 % EX PADS: 6.0000 | MEDICATED_PAD | Freq: Once | CUTANEOUS | Status: DC

## 2015-03-07 MED ORDER — DEXTROSE 5 % IV SOLN
1.5000 g | INTRAVENOUS | Status: AC
  Administered 2015-03-07: 1.5 g via INTRAVENOUS
  Filled 2015-03-07: qty 1.5

## 2015-03-07 MED ORDER — PROPOFOL 10 MG/ML IV BOLUS
INTRAVENOUS | Status: DC | PRN
  Administered 2015-03-07: 20 mg via INTRAVENOUS
  Administered 2015-03-07: 30 mg via INTRAVENOUS

## 2015-03-07 SURGICAL SUPPLY — 43 items
ARMBAND PINK RESTRICT EXTREMIT (MISCELLANEOUS) ×2 IMPLANT
CANISTER SUCTION 2500CC (MISCELLANEOUS) ×2 IMPLANT
CATH EMB 3FR 80CM (CATHETERS) ×2 IMPLANT
CATH EMB 4FR 80CM (CATHETERS) ×4 IMPLANT
CLIP TI MEDIUM 6 (CLIP) ×2 IMPLANT
CLIP TI WIDE RED SMALL 6 (CLIP) ×2 IMPLANT
COVER PROBE W GEL 5X96 (DRAPES) IMPLANT
DRAPE PROXIMA HALF (DRAPES) ×2 IMPLANT
DRAPE X-RAY CASS 24X20 (DRAPES) IMPLANT
ELECT REM PT RETURN 9FT ADLT (ELECTROSURGICAL) ×2
ELECTRODE REM PT RTRN 9FT ADLT (ELECTROSURGICAL) ×1 IMPLANT
GAUZE SPONGE 4X4 16PLY XRAY LF (GAUZE/BANDAGES/DRESSINGS) IMPLANT
GEL ULTRASOUND 20GR AQUASONIC (MISCELLANEOUS) IMPLANT
GLOVE BIO SURGEON STRL SZ 6.5 (GLOVE) ×4 IMPLANT
GLOVE BIO SURGEON STRL SZ7 (GLOVE) ×4 IMPLANT
GLOVE BIOGEL PI IND STRL 6.5 (GLOVE) ×2 IMPLANT
GLOVE BIOGEL PI IND STRL 7.0 (GLOVE) ×1 IMPLANT
GLOVE BIOGEL PI IND STRL 7.5 (GLOVE) ×2 IMPLANT
GLOVE BIOGEL PI INDICATOR 6.5 (GLOVE) ×2
GLOVE BIOGEL PI INDICATOR 7.0 (GLOVE) ×1
GLOVE BIOGEL PI INDICATOR 7.5 (GLOVE) ×2
GLOVE ECLIPSE 6.5 STRL STRAW (GLOVE) ×2 IMPLANT
GOWN STRL REUS W/ TWL LRG LVL3 (GOWN DISPOSABLE) ×3 IMPLANT
GOWN STRL REUS W/TWL LRG LVL3 (GOWN DISPOSABLE) ×3
INSERT FOGARTY SM (MISCELLANEOUS) IMPLANT
KIT BASIN OR (CUSTOM PROCEDURE TRAY) ×2 IMPLANT
KIT ROOM TURNOVER OR (KITS) ×2 IMPLANT
LIQUID BAND (GAUZE/BANDAGES/DRESSINGS) ×2 IMPLANT
NS IRRIG 1000ML POUR BTL (IV SOLUTION) ×2 IMPLANT
PACK CV ACCESS (CUSTOM PROCEDURE TRAY) ×2 IMPLANT
PAD ARMBOARD 7.5X6 YLW CONV (MISCELLANEOUS) ×4 IMPLANT
SET COLLECT BLD 21X3/4 12 (NEEDLE) IMPLANT
SPONGE SURGIFOAM ABS GEL 100 (HEMOSTASIS) IMPLANT
STOPCOCK 4 WAY LG BORE MALE ST (IV SETS) ×2 IMPLANT
SUT MNCRL AB 4-0 PS2 18 (SUTURE) ×2 IMPLANT
SUT PROLENE 5 0 C 1 24 (SUTURE) IMPLANT
SUT PROLENE 6 0 BV (SUTURE) ×2 IMPLANT
SUT VIC AB 3-0 SH 27 (SUTURE) ×1
SUT VIC AB 3-0 SH 27X BRD (SUTURE) ×1 IMPLANT
TOWEL OR 17X24 6PK STRL BLUE (TOWEL DISPOSABLE) ×2 IMPLANT
TUBING EXTENTION W/L.L. (IV SETS) IMPLANT
UNDERPAD 30X30 INCONTINENT (UNDERPADS AND DIAPERS) ×2 IMPLANT
WATER STERILE IRR 1000ML POUR (IV SOLUTION) IMPLANT

## 2015-03-07 NOTE — Interval H&P Note (Signed)
History and Physical Interval Note:  03/07/2015 10:18 AM  Jordan [name]  has presented today for surgery, with the diagnosis of End Stage Renal Disease N18.6  The various methods of treatment have been discussed with the patient and family. After consideration of risks, benefits and other options for treatment, the patient has consented to  Procedure(s): THROMBECTOMY BRACHIOCEPHALIC ARTERIOVENOUS FISTULA (Right) as a surgical intervention .  The patient's history has been reviewed, patient examined, no change in status, stable for surgery.  I have reviewed the patient's chart and labs.  Questions were answered to the patient's satisfaction.     Adele Barthel

## 2015-03-07 NOTE — Transfer of Care (Signed)
Immediate Anesthesia Transfer of Care Note  Patient: Jordan [name]  Procedure(s) Performed: Procedure(s): THROMBECTOMY OF RIGHT BRACHIOCEPHALIC ARTERIOVENOUS FISTULA (Right) LIGATION OF RIGHT BRACHIOCEPHALIC ARTERIOVENOUS  FISTULA (Right)  Patient Location: PACU  Anesthesia Type:MAC  Level of Consciousness: awake, alert , oriented and patient cooperative  Airway & Oxygen Therapy: Patient Spontanous Breathing and Patient connected to nasal cannula oxygen  Post-op Assessment: Report given to RN, Post -op Vital signs reviewed and stable and Patient moving all extremities X 4  Post vital signs: Reviewed and stable  Last Vitals:  Filed Vitals:   03/07/15 0825  BP: 148/33  Pulse: 60  Temp: 36.4 C  Resp: 16    Complications: No apparent anesthesia complications

## 2015-03-07 NOTE — Op Note (Signed)
    OPERATIVE NOTE   PROCEDURE: 1. Thrombectomy of right brachiocephalic arteriovenous fistula   2. Ligation of right brachiocephalic arteriovenous fistula   PRE-OPERATIVE DIAGNOSIS: thrombosed right brachiocephalic arteriovenous fistula, sclerotic fistula  POST-OPERATIVE DIAGNOSIS: same as above   SURGEON: Adele Barthel, MD  ASSISTANT(S): Leontine Locket, PAC   ANESTHESIA: local and MAC  ESTIMATED BLOOD LOSS: 50 cc  FINDING(S): 1.  Chronic organized thrombus 2.  Sclerotic proximal thrombus: two ruptured balloon from thrombectomy 3.  Return of limited backbleeding  4.  No palpable thrill at end of case with palpable pulse up to mid-fistula 5.  No further pulse after ligation  SPECIMEN(S):  Organized thrombus  INDICATIONS:   Jordan [name] is a 74 y.o. female who presents with thrombosed right brachiocephalic arteriovenous fistula.  On her superficialization of this deep right brachiocephalic arteriovenous fistula, I had concerns that this fistula was compromised given extensive perivenous sclerotic subcutaneous tissue.  On 2 week follow-up there continued to be a good thrill but on subsequent follow up, reported the fistula had been occluded for ~2 weeks.  I offered her a thrombectomy as an attempt to salvage this fistula.  Risk, benefits, and alternatives to access surgery were discussed.  The patient is aware the risks include but are not limited to: bleeding, infection, steal syndrome, nerve damage, ischemic monomelic neuropathy, failure to mature, need for additional procedures, death and stroke.  The patient agrees to proceed forward with the procedure.   DESCRIPTION: After obtaining full informed written consent, the patient was brought back to the operating room and placed supine upon the operating table.  The patient received IV antibiotics prior to induction.  After obtaining adequate anesthesia, the patient was prepped and draped in the standard fashion for: right arm  access procedure.  I made an incision over the distal fistula and dissected it out.  This segment was 8-10 mm in diameter with a strong pulse without thrill.  I clamped the fistula distally and then made an venotomy.  I completed a thrombectomy of this fistula with a 3 Fogarty and 4 Fogarty.  I ruptured two 4 Fogarty balloons in this process.  Given a vein should not have enough calcification to rupture a thrombectomy balloon, this is an concerning findings.  I obtained chronic organized thrombus with limited backbleeding.  I repaired the venotomy with a running stitch of 5-0 Prolene.  I released the clamp and there was no thrill.  I could feel a pulse up to mid-segment of this fistula but not thrill or pulse in the proximal segment.  In this case, the proximal disease makes this fistula non-salvageable, so I elected to ligate the distal fistula.  I tied two 2-0 silk ties around the distal fistula, leaving a segment as a vein patch on the brachial artery.  At the end of this case, there was no thrill in this fistula.  The surgical site was dry without active bleeding.  The subcutaneous tissue was closed with a layer of 3-0 Vicryl and then the skin reapproximated with a running subcuticular of 4-0 Monocryl.  The skin was cleaned, dried, and reinforced with Dermabond.    The patient will return to the office for repeat vein mapping and placement of a new fistula or graft.   COMPLICATIONS: none  CONDITION: stable   Adele Barthel, MD [facility] Office: [phone] Pager: [phone]  03/07/2015, 11:48 AM

## 2015-03-07 NOTE — Anesthesia Postprocedure Evaluation (Signed)
Anesthesia Post Note  Patient: Jordan [name]  Procedure(s) Performed: Procedure(s) (LRB): THROMBECTOMY OF RIGHT BRACHIOCEPHALIC ARTERIOVENOUS FISTULA (Right) LIGATION OF RIGHT BRACHIOCEPHALIC ARTERIOVENOUS  FISTULA (Right)  Patient location during evaluation: PACU Anesthesia Type: MAC Level of consciousness: awake and alert Pain management: pain level controlled Vital Signs Assessment: post-procedure vital signs reviewed and stable Respiratory status: spontaneous breathing, nonlabored ventilation, respiratory function stable and patient connected to nasal cannula oxygen Cardiovascular status: blood pressure returned to baseline and stable Postop Assessment: No signs of nausea or vomiting Anesthetic complications: no    Last Vitals:  Filed Vitals:   03/07/15 1223 03/07/15 1232  BP:  128/51  Pulse: 56   Temp: 36.6 C 36.6 C  Resp: 12 15    Last Pain:  Filed Vitals:   03/07/15 1239  PainSc: 0-No pain                 Luda Charbonneau,E. Kiowa Hollar

## 2015-03-07 NOTE — H&P (View-Only) (Signed)
Postoperative Access Visit   History of Present Illness  Jordan [name] is a 74 y.o. year old female who presents for follow-up s/p superificialization of right brachiocephalic AV fistula  (Date: 01/26/15).  The patient's wounds are healed.  The patient notes no steal symptoms.  The patient is able to complete their activities of daily living. She is currently dialyzing TTS via a right IJ TDC. She reports that at dialysis she was told that her graft no longer had a "swishing" sound in it about two weeks ago. She says her fistula has not been examined since then. She also notes issues with clotting during dialysis.   Past Medical History  Diagnosis Date  . Breast cancer ([place])   . Diabetes mellitus with ESRD (end-stage renal disease) ([place])   . Hypertension   . Arthritis   . COPD (chronic obstructive pulmonary disease) ([place])   . Renal disorder     Family reports acute renal failure  . GERD (gastroesophageal reflux disease)   . CKD (chronic kidney disease)   . Systolic CHF, chronic ([place]) 12/23/2014    Past Surgical History  Procedure Laterality Date  . Breast surgery Left   . Cardiac catheterization [name] 10/19/2014    Procedure: Right/Left Heart Cath and Coronary Angiography;  Surgeon: Peter M Martinique, MD;  Location: [facility];  Service: Cardiovascular;  Laterality: [name];  . Av fistula placement Right 10/26/2014    Procedure: Right Brachiocephalic Arteriovenous FISTULA CREATION;  Surgeon: Conrad Ocilla, MD;  Location: [facility];  Service: Vascular;  Laterality: Right;  . Tee without cardioversion [name] 12/27/2014    Procedure: TRANSESOPHAGEAL ECHOCARDIOGRAM (TEE);  Surgeon: Thayer Headings, MD;  Location: [facility];  Service: Cardiovascular;  Laterality: [name];  . Peripheral vascular catheterization [name] 01/24/2015    Procedure: Fistulagram;  Surgeon: Conrad Sanford, MD;  Location: [facility];  Service: Cardiovascular;  Laterality: [name];  . Fistula superficialization Right  01/26/2015    Procedure: Right BRACHIOCEPHALIC ARTERIOVENOUS FISTULA SUPERFICIALIZATION with side branch ligation;  Surgeon: Conrad [place], MD;  Location: [facility];  Service: Vascular;  Laterality: Right;    Social History   Social History  . Marital Status: Single    Spouse Name: [name]  . Number of Children: [name]  . Years of Education: [name]   Occupational History  . Not on file.   Social History Main Topics  . Smoking status: Never Smoker   . Smokeless tobacco: Never Used  . Alcohol Use: No  . Drug Use: No  . Sexual Activity: Not on file   Other Topics Concern  . Not on file   Social History Narrative    Family History  Problem Relation Age of Onset  . Diabetes Mother   . Hypertension Mother   . Stroke Sister   . Hypertension Sister   . Heart attack Neg Hx     Current Outpatient Prescriptions  Medication Sig Dispense Refill  . albuterol (PROVENTIL HFA;VENTOLIN HFA) 108 (90 BASE) MCG/ACT inhaler Inhale 1-2 puffs into the lungs every 6 (six) hours as needed for wheezing or shortness of breath.    . carvedilol (COREG) 6.25 MG tablet Take 1 tablet (6.25 mg total) by mouth 2 (two) times daily with a meal. 60 tablet 1  . gabapentin (NEURONTIN) 300 MG capsule Take 1 capsule (300 mg total) by mouth at bedtime. 60 capsule 6  . Insulin Detemir (LEVEMIR FLEXPEN) 100 UNIT/ML Pen Inject 55 Units into the skin every morning.  15 mL 11  . insulin lispro (HUMALOG KWIKPEN) 100 UNIT/ML KiwkPen Inject 1-5 Units into the skin 3 (three) times daily as needed (blood sugar).     . Multiple Vitamins-Minerals (CENTRUM SILVER ADULT 50+ PO) Take 1 tablet by mouth daily.    Marland Kitchen HYDROcodone-acetaminophen (NORCO/VICODIN) 5-325 MG tablet Take 1 tablet by mouth every 6 (six) hours as needed for moderate pain. (Patient not taking: Reported on 02/09/2015) 30 tablet 0  . vancomycin in sodium chloride 0.9 % 100 mL Patient will need IV vancomycin during hemodialysis for enterococcus bacteremia for next 2 weeks start  date 12/29/2014, this is to be dosed and manage by nephrology at hemodialysis center (Patient not taking: Reported on 03/04/2015)    . Zinc Oxide 13 % CREA Apply 1 application topically 3 (three) times daily. To buttock cleft (Patient not taking: Reported on 03/04/2015) 136 g 1   No current facility-administered medications for this visit.     Allergies  Allergen Reactions  . Vicodin [Hydrocodone-Acetaminophen] Itching    REVIEW OF SYSTEMS:  (Positives checked otherwise negative)  CARDIOVASCULAR:   [ ]  chest pain,  [ ]  chest pressure,  [ ]  palpitations,  [ ]  shortness of breath when laying flat,  [ ]  shortness of breath with exertion,   [ ]  pain in feet when walking,  [ ]  pain in feet when laying flat, [ ]  history of blood clot in veins (DVT),  [ ]  history of phlebitis,  [ ]  swelling in legs,  [ ]  varicose veins  PULMONARY:   [ ]  productive cough,  [ ]  asthma,  [ ]  wheezing  NEUROLOGIC:   [ ]  weakness in arms or legs,  [ ]  numbness in arms or legs,  [ ]  difficulty speaking or slurred speech,  [ ]  temporary loss of vision in one eye,  [ ]  dizziness  HEMATOLOGIC:   [ ]  bleeding problems,  [ ]  problems with blood clotting too easily  MUSCULOSKEL:   [ ]  joint pain, [ ]  joint swelling  GASTROINTEST:   [ ]  vomiting blood,  [ ]  blood in stool     GENITOURINARY:   [ ]  burning with urination,  [ ]  blood in urine  PSYCHIATRIC:   [ ]  history of major depression  INTEGUMENTARY:   [ ]  rashes,  [ ]  ulcers  CONSTITUTIONAL:   [ ]  fever,  [ ]  chills  For VQI Use Only  PRE-ADM LIVING: Home  AMB STATUS: Ambulatory with Assistance  Physical Examination Filed Vitals:   03/04/15 1303  BP: 123/72  Pulse: 70  Temp: 98.1 F (36.7 C)  Resp: 16   Pulmonary: Sym exp, good air movt, CTAB, no rales, rhonchi, & wheezing  Cardiac: RRR, Nl S1, S2, no Murmurs, rubs or gallops  RUE: Incision is healed. Non palpable thrill and non-audible bruit.   Medical Decision  Making  Jordan [name] is a 74 y.o. year old female who presents with clotted right brachiocephalic AV fistula. She is s/p superficialization of her right brachiocephalic AVF on 0000000. She dialyzes via a right IJ TDC on TTS.    Her fistula has been clotted for around two weeks. Will attempt to salvage her fistula. Plan for surgical thrombectomy of her right brachiocephalic AV fistula on 25 Feb 2015 with Dr. Bridgett Larsson.   Thank you for allowing [place] to participate in this patient's care.  Virgina Jock, PA-C [facility] Office: [phone]   03/04/2015, 1:23 PM  Addendum  I have independently interviewed and examined the patient, and I agree with the physician assistant's findings.  Pt's R BC AVF would found to have significant inflammation perivenous intraoperatively.  However, the fistula was patent ~2 weeks ago, so I think its worth trying to salvage this fistula given the likely difficulty in placing other access in this patient.  Will schedule for this coming Monday, 21 NOV 16.  Adele Barthel, MD [facility] Office: [phone] Pager: [phone]  03/04/2015, 1:43 PM

## 2015-03-07 NOTE — Anesthesia Preprocedure Evaluation (Signed)
Anesthesia Evaluation  Patient identified by MRN, date of birth, ID band Patient awake    Reviewed: Allergy & Precautions, NPO status , Patient's Chart, lab work & pertinent test results  Airway Mallampati: II  TM Distance: >3 FB Neck ROM: Full    Dental  (+) Chipped, Poor Dentition, Missing, Dental Advisory Given   Pulmonary shortness of breath and with exertion, COPD,    breath sounds clear to auscultation       Cardiovascular hypertension, Pt. on medications and Pt. on home beta blockers (-) angina+CHF (H/o renal related CHF)   Rhythm:Regular Rate:Normal  9/16 ECHO: EF 50-55%, mod AI, mild TR   Neuro/Psych negative neurological ROS     GI/Hepatic Neg liver ROS, GERD  Controlled,  Endo/Other  diabetes (glu 139), Insulin DependentMorbid obesity  Renal/GU ESRF and DialysisRenal disease (last dialyzed saturday, K+ 4.9)     Musculoskeletal  (+) Arthritis , Osteoarthritis,    Abdominal (+) + obese,   Peds  Hematology   Anesthesia Other Findings Breast cancer  Reproductive/Obstetrics                             Anesthesia Physical Anesthesia Plan  ASA: III  Anesthesia Plan: MAC   Post-op Pain Management:    Induction:   Airway Management Planned: Natural Airway and Simple Face Mask  Additional Equipment:   Intra-op Plan:   Post-operative Plan:   Informed Consent: I have reviewed the patients History and Physical, chart, labs and discussed the procedure including the risks, benefits and alternatives for the proposed anesthesia with the patient or authorized representative who has indicated his/her understanding and acceptance.   Dental advisory given  Plan Discussed with: Surgeon and CRNA  Anesthesia Plan Comments: (Plan routine monitors, MAC)        Anesthesia Quick Evaluation

## 2015-03-07 NOTE — Discharge Instructions (Signed)
° ° °  03/07/2015 Jordan [name] [id number] 09-Mar-1941  Surgeon(s): Conrad McFarland, MD  Procedure(s): THROMBECTOMY OF RIGHT BRACHIOCEPHALIC ARTERIOVENOUS FISTULA LIGATION OF RIGHT BRACHIOCEPHALIC ARTERIOVENOUS  FISTULA

## 2015-03-07 NOTE — Anesthesia Procedure Notes (Signed)
Procedure Name: MAC Date/Time: 03/07/2015 10:47 AM Performed by: Rogers Blocker Pre-anesthesia Checklist: Patient identified, Emergency Drugs available, Suction available, Patient being monitored and Timeout performed Patient Re-evaluated:Patient Re-evaluated prior to inductionOxygen Delivery Method: Nasal cannula Placement Confirmation: positive ETCO2

## 2015-03-08 ENCOUNTER — Encounter (HOSPITAL_COMMUNITY): Payer: Self-pay | Admitting: Vascular Surgery

## 2015-03-08 ENCOUNTER — Telehealth: Payer: Self-pay | Admitting: Vascular Surgery

## 2015-03-08 NOTE — Telephone Encounter (Signed)
-----   Message from Mena Goes, RN sent at 03/07/2015 12:59 PM EST ----- Regarding: schedule   ----- Message -----    From: Gabriel Earing, PA-C    Sent: 03/07/2015  11:52 AM      To: Vvs Charge Pool  S/p thrombectomy and ligation of right arm fistula.  F/u with Dr. Bridgett Larsson in 2-4 weeks with BUE vein mapping.  Thanks, Aldona Bar

## 2015-03-08 NOTE — Telephone Encounter (Signed)
Spoke with pt, dpm °

## 2015-03-18 ENCOUNTER — Encounter: Payer: Self-pay | Admitting: Vascular Surgery

## 2015-03-21 ENCOUNTER — Ambulatory Visit: Payer: Medicare (Managed Care) | Attending: Family Medicine | Admitting: Occupational Therapy

## 2015-03-21 DIAGNOSIS — I972 Postmastectomy lymphedema syndrome: Secondary | ICD-10-CM | POA: Insufficient documentation

## 2015-03-21 NOTE — Therapy (Signed)
[facility] [address] [place], [address] Phone: [phone]   Fax:  [phone]  Occupational Therapy Treatment  Patient Details  Name: Jordan [name] MRN: [id number] Date of Birth: May 22, 1940 No Data Recorded  Encounter Date: 03/21/2015      OT End of Session - 03/21/15 1257    Visit Number 3   Number of Visits 8   Date for OT Re-Evaluation 03/21/15   OT Start Time 1009   OT Stop Time 1044   OT Time Calculation (min) 35 min   Activity Tolerance Patient tolerated treatment well   Behavior During Therapy [facility] for tasks assessed/performed      Past Medical History  Diagnosis Date  . Breast cancer ([place])   . Diabetes mellitus with ESRD (end-stage renal disease) ([place])   . Hypertension   . Arthritis   . COPD (chronic obstructive pulmonary disease) ([place])   . Renal disorder     Family reports acute renal failure  . GERD (gastroesophageal reflux disease)   . CKD (chronic kidney disease)   . Systolic CHF, chronic ([place]) 12/23/2014    Past Surgical History  Procedure Laterality Date  . Breast surgery Left   . Cardiac catheterization [name] 10/19/2014    Procedure: Right/Left Heart Cath and Coronary Angiography;  Surgeon: Peter M Martinique, MD;  Location: [facility];  Service: Cardiovascular;  Laterality: [name];  . Av fistula placement Right 10/26/2014    Procedure: Right Brachiocephalic Arteriovenous FISTULA CREATION;  Surgeon: Conrad Berrysburg, MD;  Location: [facility];  Service: Vascular;  Laterality: Right;  . Tee without cardioversion [name] 12/27/2014    Procedure: TRANSESOPHAGEAL ECHOCARDIOGRAM (TEE);  Surgeon: Thayer Headings, MD;  Location: [facility];  Service: Cardiovascular;  Laterality: [name];  . Peripheral vascular catheterization [name] 01/24/2015    Procedure: Fistulagram;  Surgeon: Conrad [place], MD;  Location: [facility];  Service: Cardiovascular;  Laterality: [name];  . Fistula  superficialization Right 01/26/2015    Procedure: Right BRACHIOCEPHALIC ARTERIOVENOUS FISTULA SUPERFICIALIZATION with side branch ligation;  Surgeon: Conrad [place]burg, MD;  Location: [facility];  Service: Vascular;  Laterality: Right;  . Thrombectomy w/ embolectomy Right 03/07/2015    Procedure: THROMBECTOMY OF RIGHT BRACHIOCEPHALIC ARTERIOVENOUS FISTULA;  Surgeon: Conrad Fabens, MD;  Location: [facility];  Service: Vascular;  Laterality: Right;  . Ligation of arteriovenous  fistula Right 03/07/2015    Procedure: LIGATION OF RIGHT BRACHIOCEPHALIC ARTERIOVENOUS  FISTULA;  Surgeon: Conrad , MD;  Location: [facility];  Service: Vascular;  Laterality: Right;    There were no vitals filed for this visit.  Visit Diagnosis:  Postmastectomy lymphedema syndrome      Subjective Assessment - 03/21/15 1250    Subjective  I had surgery again since I seen you on this vein in my R arm - but then it burst and I am having another surgery to fix that -seeing the MD Friday - L arm doing okay- I keep that little sleeve thing on about 75% of time    Patient Stated Goals I do not want my arm to get worse , or get infection   Currently in Pain? No/denies             LYMPHEDEMA/ONCOLOGY QUESTIONNAIRE - 03/21/15 1015    Left Upper Extremity Lymphedema   10 cm Proximal to Olecranon Process 48 cm   Olecranon Process 29.5 cm   15 cm Proximal to Ulnar Styloid Process 30  cm   10 cm Proximal to Ulnar Styloid Process 27.5 cm   Just Proximal to Ulnar Styloid Process 22 cm   Across Hand at [employer] 22 cm   At [place] of 2nd Digit 7.7 cm   At [facility] of [facility] 7.9 cm                 OT Treatments/Exercises (OP) - 03/21/15 0001    ADLs   ADL Comments Fitted with new small isotoner glove and D tubi grip - to wear 2 days and get measured for daytime compression sleeve Marcy Panning - she has out of [employer]  and they are waiting for preauthorazation    Manual Therapy   Manual therapy comments Circumference of L UE  measured - see flowsheet-                OT Education - 03/21/15 1256    Education provided Yes   Education Details HEP   Person(s) Educated Patient   Methods Explanation;Demonstration;Tactile cues;Verbal cues;Handout   Comprehension Returned demonstration;Verbalized understanding;Verbal cues required          OT Short Term Goals - 03/21/15 1300    OT SHORT TERM GOAL #1   Title L UE circumference decreaes by 1 cm at hand thru elbow , 0.5 at digits to be fitted with compression sleeve/glove for daytime use    Baseline No compression this far - increase by 1 cm digits, 2 cm hand, forearm , upper arm; 3 cm at wrist    Time 4   Period Weeks   Status On-going           OT Long Term Goals - 03/21/15 1300    OT LONG TERM GOAL #1   Title Pt and family be ind in donning appriate compression garments to decrease and maintain  L UE lymphedema to decrease risk for infection    Baseline Getting measured for daytime compression garments    Time 4   Period Weeks   Status On-going               Plan - 03/21/15 1258    Clinical Impression Statement Pt's forearm to upper arm decrease from [facility] but wrist and hand sitll increased - pt still increase compare to R UE in hand to elbow - pt to wear isotoner glove and tubi grip D until Wed and get measured for daytime compression   sleeve and glove -  when she get that start wearing during day and Jovipak as needed - and then daytime isotoner glove/tubigrip  - and OT will check  in Jan pt's measurements - pt going to  [place] over the [place]    Pt will benefit from skilled therapeutic intervention in order to improve on the following deficits (Retired) Impaired UE functional use;Impaired sensation;Impaired flexibility;Decreased strength;Decreased range of motion;Decreased activity tolerance;Increased edema   Rehab Potential Good   OT Frequency Biweekly   OT Duration 4 weeks   OT Treatment/Interventions Manual lymph  drainage;Therapeutic exercise;Passive range of motion;Patient/family education;Self-care/ADL training   Plan Reassess circumferenc and assess wear of compression    OT Home Exercise Plan see pt instruction   Consulted and Agree with Plan of Care Patient        Problem List Patient Active Problem List   Diagnosis Date Noted  . Loose stools 01/13/2015  . Skin irritation 01/13/2015  . Rib pain on left side 01/13/2015  . Enterococcal bacteremia 12/26/2014  . Bacteremia   .  UTI (lower urinary tract infection) 12/23/2014  . Acute encephalopathy 12/23/2014  . Systolic CHF, chronic ([place]) 12/23/2014  . Encephalopathy, metabolic [id number]  . Lymphedema of left arm 12/08/2014  . OSA (obstructive sleep apnea) 12/08/2014  . Constipation 12/08/2014  . Nonischemic cardiomyopathy ([place]) 11/10/2014  . Acute on chronic systolic heart failure, NYHA class 3 ([place]) 11/10/2014  . Morbidly obese ([place]) 11/10/2014  . ESRD (end stage renal disease) on dialysis ([place])   . Type 2 diabetes mellitus with diabetic nephropathy ([place])   . Essential hypertension   . Left hip pain   . NSTEMI (non-ST elevated myocardial infarction) ([place]) 10/13/2014  . Pulmonary HTN ([place]) 10/13/2014  . Syncope 10/12/2014  . SOB (shortness of breath) 10/12/2014  . Chest pain with high risk for cardiac etiology 10/12/2014  . LBBB (left bundle branch block) 10/12/2014  . Breast cancer ([place])   . Hypertension   . Arthritis   . COPD (chronic obstructive pulmonary disease) ([facility])     Altha Sweitzer OTR/L, CLT 03/21/2015, 1:02 PM  [facility] [address] [place], [address] Phone: [phone]   Fax:  [phone]  Name: Jordan [name] MRN: [id number] Date of Birth: [birth year]

## 2015-03-21 NOTE — Patient Instructions (Signed)
Wear isotoner glove and D tubigrip - for about 2 day and get measured for daytime compression sleeve and glove Wed Start wearing daytime compression and jovipak when she gets it  Will reassess her in Jan -

## 2015-03-25 ENCOUNTER — Ambulatory Visit (INDEPENDENT_AMBULATORY_CARE_PROVIDER_SITE_OTHER): Payer: Medicare (Managed Care) | Admitting: Vascular Surgery

## 2015-03-25 ENCOUNTER — Ambulatory Visit (HOSPITAL_COMMUNITY)
Admission: RE | Admit: 2015-03-25 | Discharge: 2015-03-25 | Disposition: A | Discharge status: Home / Self Care | Payer: Medicare (Managed Care) | Source: Ambulatory Visit | Attending: Vascular Surgery | Admitting: Vascular Surgery

## 2015-03-25 ENCOUNTER — Encounter: Payer: Self-pay | Admitting: Vascular Surgery

## 2015-03-25 VITALS — BP 122/68 | HR 75 | Temp 98.2°F | Resp 16 | Ht 65.0 in | Wt 265.0 lb

## 2015-03-25 DIAGNOSIS — N186 End stage renal disease: Secondary | ICD-10-CM

## 2015-03-25 DIAGNOSIS — Z01810 Encounter for preprocedural cardiovascular examination: Secondary | ICD-10-CM

## 2015-03-25 DIAGNOSIS — Z992 Dependence on renal dialysis: Secondary | ICD-10-CM

## 2015-03-25 NOTE — Progress Notes (Signed)
Postoperative Access Visit   History of Present Illness  Jordan [name] is a 74 y.o. year old female who presents for postoperative follow-up for: thrombectomy of right brachiocephalic arteriovenous fistula , ligation of right brachiocephalic arteriovenous fistula  (Date: 03/07/15).  Unfortunately, the fistula rethrombosed on the table due to a long segment stenosis with calcification within the vein.  The patient returns for re-evaluation for additional access.  The left arm has lymphedema due to a mastectomy with axillary node dissection.    Past Medical History  Diagnosis Date  . Breast cancer ([place])   . Diabetes mellitus with ESRD (end-stage renal disease) ([place])   . Hypertension   . Arthritis   . COPD (chronic obstructive pulmonary disease) ([place])   . Renal disorder     Family reports acute renal failure  . GERD (gastroesophageal reflux disease)   . CKD (chronic kidney disease)   . Systolic CHF, chronic ([place]) 12/23/2014    Past Surgical History  Procedure Laterality Date  . Breast surgery Left   . Cardiac catheterization [name] 10/19/2014    Procedure: Right/Left Heart Cath and Coronary Angiography;  Surgeon: Peter M Martinique, MD;  Location: [facility];  Service: Cardiovascular;  Laterality: [name];  . Av fistula placement Right 10/26/2014    Procedure: Right Brachiocephalic Arteriovenous FISTULA CREATION;  Surgeon: Conrad Webster, MD;  Location: [facility];  Service: Vascular;  Laterality: Right;  . Tee without cardioversion [name] 12/27/2014    Procedure: TRANSESOPHAGEAL ECHOCARDIOGRAM (TEE);  Surgeon: Thayer Headings, MD;  Location: [facility];  Service: Cardiovascular;  Laterality: [name];  . Peripheral vascular catheterization [name] 01/24/2015    Procedure: Fistulagram;  Surgeon: Conrad [place], MD;  Location: [facility];  Service: Cardiovascular;  Laterality: [name];  . Fistula superficialization Right 01/26/2015    Procedure: Right BRACHIOCEPHALIC ARTERIOVENOUS FISTULA  SUPERFICIALIZATION with side branch ligation;  Surgeon: Conrad Tulia, MD;  Location: [facility];  Service: Vascular;  Laterality: Right;  . Thrombectomy w/ embolectomy Right 03/07/2015    Procedure: THROMBECTOMY OF RIGHT BRACHIOCEPHALIC ARTERIOVENOUS FISTULA;  Surgeon: Conrad [place], MD;  Location: [facility];  Service: Vascular;  Laterality: Right;  . Ligation of arteriovenous  fistula Right 03/07/2015    Procedure: LIGATION OF RIGHT BRACHIOCEPHALIC ARTERIOVENOUS  FISTULA;  Surgeon: Conrad [place], MD;  Location: [facility];  Service: Vascular;  Laterality: Right;    Social History   Social History  . Marital Status: Single    Spouse Name: [name]  . Number of Children: [name]  . Years of Education: [name]   Occupational History  . Not on file.   Social History Main Topics  . Smoking status: Never Smoker   . Smokeless tobacco: Never Used  . Alcohol Use: No  . Drug Use: No  . Sexual Activity: Not on file   Other Topics Concern  . Not on file   Social History Narrative    Family History  Problem Relation Age of Onset  . Diabetes Mother   . Hypertension Mother   . Stroke Sister   . Hypertension Sister   . Heart attack Neg Hx     Current Outpatient Prescriptions  Medication Sig Dispense Refill  . albuterol (PROVENTIL HFA;VENTOLIN HFA) 108 (90 BASE) MCG/ACT inhaler Inhale 1-2 puffs into the lungs every 6 (six) hours as needed for wheezing or shortness of breath.    Marland Kitchen aspirin 81 MG tablet Take 81 mg by mouth daily.    . calcium carbonate (  TUMS - DOSED IN MG ELEMENTAL CALCIUM) 500 MG chewable tablet Chew 1 tablet by mouth 3 (three) times daily with meals.    . carvedilol (COREG) 6.25 MG tablet Take 1 tablet (6.25 mg total) by mouth 2 (two) times daily with a meal. 60 tablet 1  . Cholecalciferol (D3-1000) 1000 UNITS tablet Take 1,000 Units by mouth 3 (three) times a week. Tues Thurs and Sat    . gabapentin (NEURONTIN) 300 MG capsule Take 1 capsule (300 mg total) by mouth at bedtime. 60 capsule 6  .  HYDROcodone-acetaminophen (NORCO/VICODIN) 5-325 MG tablet Take 1 tablet by mouth every 6 (six) hours as needed for moderate pain. 20 tablet 0  . Insulin Detemir (LEVEMIR FLEXPEN) 100 UNIT/ML Pen Inject 55 Units into the skin every morning. 15 mL 11  . insulin lispro (HUMALOG KWIKPEN) 100 UNIT/ML KiwkPen Inject 1-5 Units into the skin 3 (three) times daily as needed (blood sugar).     . Multiple Vitamins-Minerals (CENTRUM SILVER ADULT 50+ PO) Take 1 tablet by mouth daily.    . DESITIN 13 % CREA      No current facility-administered medications for this visit.     Allergies  Allergen Reactions  . Vicodin [Hydrocodone-Acetaminophen] Itching     REVIEW OF SYSTEMS:  (Positives checked otherwise negative)  CARDIOVASCULAR:   [ ]  chest pain,  [ ]  chest pressure,  [ ]  palpitations,  [ ]  shortness of breath when laying flat,  [ ]  shortness of breath with exertion,   [ ]  pain in feet when walking,  [ ]  pain in feet when laying flat, [ ]  history of blood clot in veins (DVT),  [ ]  history of phlebitis,  [ ]  swelling in legs,  [ ]  varicose veins  PULMONARY:   [ ]  productive cough,  [ ]  asthma,  [ ]  wheezing  NEUROLOGIC:   [ ]  weakness in arms or legs,  [ ]  numbness in arms or legs,  [ ]  difficulty speaking or slurred speech,  [ ]  temporary loss of vision in one eye,  [ ]  dizziness  HEMATOLOGIC:   [ ]  bleeding problems,  [ ]  problems with blood clotting too easily  MUSCULOSKEL:   [ ]  joint pain, [ ]  joint swelling  GASTROINTEST:   [ ]  vomiting blood,  [ ]  blood in stool     GENITOURINARY:   [ ]  burning with urination,  [ ]  blood in urine [x]  ESRD-HD: T/R/S  PSYCHIATRIC:   [ ]  history of major depression  INTEGUMENTARY:   [ ]  rashes,  [ ]  ulcers  CONSTITUTIONAL:   [ ]  fever,  [ ]  chills    For VQI Use Only  PRE-ADM LIVING: Home  AMB STATUS: Ambulatory  Physical Examination Filed Vitals:   03/25/15 1556  BP: 122/68  Pulse: 75  Temp: 98.2 F (36.8 C)    Resp: 16   Pulmonary: Sym exp, good air movt, CTAB, no rales, rhonchi, & wheezing  Cardiac: RRR, Nl S1, S2, no Murmurs, rubs or gallops  RUE: incisions are healed, skin feels warm, hand grip is 5/5, sensation in digits is intact, no palpable thrill, no bruit can be auscultated, On Sonosite: both basilic and brachial vein visualized   Medical Decision Making  Jordan [name] is a 74 y.o. year old female who presents s/p thrombectomy and ligation of R BC AVF, ESRD-HD   Next access option would be a staged R BVT vs BrVT.   Risk, benefits, and alternatives to  access surgery were discussed.   The patient is aware the risks include but are not limited to: bleeding, infection, steal syndrome, nerve damage, ischemic monomelic neuropathy, failure to mature, need for additional procedures, death and stroke.   The patient is aware that this is a staged procedure. The patient agrees to proceed forward with the procedure on 28 DEC 16.  Thank you for allowing [place] to participate in this patient's care.  Adele Barthel, MD [facility] Office: [phone] Pager: [phone]  03/25/2015, 4:34 PM

## 2015-03-31 ENCOUNTER — Other Ambulatory Visit: Payer: Self-pay

## 2015-04-08 NOTE — Progress Notes (Signed)
Called pt for pre-op call. She states nothing has changed with her medical or surgical history since she was here in November. She states she was instructed by Dr. Lianne Moris office not to take any insulin the morning of surgery. She denies any recent chest pain or sob.

## 2015-04-12 MED ORDER — CHLORHEXIDINE GLUCONATE CLOTH 2 % EX PADS: 6.0000 | MEDICATED_PAD | Freq: Once | CUTANEOUS | Status: DC

## 2015-04-12 MED ORDER — DEXTROSE 5 % IV SOLN
1.5000 g | INTRAVENOUS | Status: AC
  Administered 2015-04-13: 1.5 g via INTRAVENOUS
  Filled 2015-04-12: qty 1.5

## 2015-04-12 MED ORDER — SODIUM CHLORIDE 0.9 % IV SOLN
INTRAVENOUS | Status: DC
  Administered 2015-04-13 (×2): via INTRAVENOUS

## 2015-04-13 ENCOUNTER — Ambulatory Visit (HOSPITAL_COMMUNITY): Payer: Medicare (Managed Care) | Admitting: Anesthesiology

## 2015-04-13 ENCOUNTER — Encounter (HOSPITAL_COMMUNITY): Payer: Self-pay | Admitting: *Deleted

## 2015-04-13 ENCOUNTER — Encounter (HOSPITAL_COMMUNITY)
Admission: RE | Disposition: A | Discharge status: Home / Self Care | Payer: Self-pay | Source: Ambulatory Visit | Attending: Vascular Surgery

## 2015-04-13 ENCOUNTER — Ambulatory Visit (HOSPITAL_COMMUNITY)
Admission: RE | Admit: 2015-04-13 | Discharge: 2015-04-13 | Disposition: A | Discharge status: Home / Self Care | Payer: Medicare (Managed Care) | Source: Ambulatory Visit | Attending: Vascular Surgery | Admitting: Vascular Surgery

## 2015-04-13 DIAGNOSIS — J449 Chronic obstructive pulmonary disease, unspecified: Secondary | ICD-10-CM | POA: Insufficient documentation

## 2015-04-13 DIAGNOSIS — I5022 Chronic systolic (congestive) heart failure: Secondary | ICD-10-CM | POA: Insufficient documentation

## 2015-04-13 DIAGNOSIS — I132 Hypertensive heart and chronic kidney disease with heart failure and with stage 5 chronic kidney disease, or end stage renal disease: Secondary | ICD-10-CM | POA: Diagnosis not present

## 2015-04-13 DIAGNOSIS — E1122 Type 2 diabetes mellitus with diabetic chronic kidney disease: Secondary | ICD-10-CM | POA: Insufficient documentation

## 2015-04-13 DIAGNOSIS — K219 Gastro-esophageal reflux disease without esophagitis: Secondary | ICD-10-CM | POA: Diagnosis not present

## 2015-04-13 DIAGNOSIS — Z992 Dependence on renal dialysis: Secondary | ICD-10-CM | POA: Diagnosis not present

## 2015-04-13 DIAGNOSIS — N186 End stage renal disease: Secondary | ICD-10-CM | POA: Insufficient documentation

## 2015-04-13 DIAGNOSIS — I972 Postmastectomy lymphedema syndrome: Secondary | ICD-10-CM | POA: Insufficient documentation

## 2015-04-13 DIAGNOSIS — Z853 Personal history of malignant neoplasm of breast: Secondary | ICD-10-CM | POA: Diagnosis not present

## 2015-04-13 DIAGNOSIS — Z794 Long term (current) use of insulin: Secondary | ICD-10-CM | POA: Insufficient documentation

## 2015-04-13 DIAGNOSIS — Z6841 Body Mass Index (BMI) 40.0 and over, adult: Secondary | ICD-10-CM | POA: Diagnosis not present

## 2015-04-13 DIAGNOSIS — Z7982 Long term (current) use of aspirin: Secondary | ICD-10-CM | POA: Insufficient documentation

## 2015-04-13 DIAGNOSIS — N185 Chronic kidney disease, stage 5: Secondary | ICD-10-CM | POA: Diagnosis not present

## 2015-04-13 DIAGNOSIS — G4730 Sleep apnea, unspecified: Secondary | ICD-10-CM | POA: Insufficient documentation

## 2015-04-13 DIAGNOSIS — Z9012 Acquired absence of left breast and nipple: Secondary | ICD-10-CM | POA: Diagnosis not present

## 2015-04-13 HISTORY — PX: BASCILIC VEIN TRANSPOSITION: SHX5742

## 2015-04-13 LAB — GLUCOSE, CAPILLARY
GLUCOSE-CAPILLARY: 136 mg/dL — AB (ref 65–99)
Glucose-Capillary: 113 mg/dL — ABNORMAL HIGH (ref 65–99)

## 2015-04-13 LAB — POCT I-STAT 4, (NA,K, GLUC, HGB,HCT)
GLUCOSE: 161 mg/dL — AB (ref 65–99)
HEMATOCRIT: 36 % (ref 36.0–46.0)
Hemoglobin: 12.2 g/dL (ref 12.0–15.0)
Potassium: 4.4 mmol/L (ref 3.5–5.1)
SODIUM: 139 mmol/L (ref 135–145)

## 2015-04-13 SURGERY — TRANSPOSITION, VEIN, BASILIC
Anesthesia: General | Site: Arm Upper | Laterality: Right

## 2015-04-13 MED ORDER — PROPOFOL 10 MG/ML IV BOLUS
INTRAVENOUS | Status: DC | PRN
  Administered 2015-04-13: 20 mg via INTRAVENOUS
  Administered 2015-04-13: 50 mg via INTRAVENOUS
  Administered 2015-04-13: 90 mg via INTRAVENOUS

## 2015-04-13 MED ORDER — FENTANYL CITRATE (PF) 100 MCG/2ML IJ SOLN
INTRAMUSCULAR | Status: DC | PRN
  Administered 2015-04-13: 50 ug via INTRAVENOUS

## 2015-04-13 MED ORDER — 0.9 % SODIUM CHLORIDE (POUR BTL) OPTIME
TOPICAL | Status: DC | PRN
  Administered 2015-04-13: 1000 mL

## 2015-04-13 MED ORDER — SODIUM CHLORIDE 0.9 % IV SOLN
INTRAVENOUS | Status: DC | PRN
  Administered 2015-04-13: 10:00:00

## 2015-04-13 MED ORDER — PROPOFOL 10 MG/ML IV BOLUS
INTRAVENOUS | Status: AC
  Filled 2015-04-13: qty 20

## 2015-04-13 MED ORDER — OXYCODONE HCL 5 MG PO TABS: 5.0000 mg | ORAL_TABLET | Freq: Once | ORAL | Status: DC | PRN

## 2015-04-13 MED ORDER — FENTANYL CITRATE (PF) 100 MCG/2ML IJ SOLN: 25.0000 ug | INTRAMUSCULAR | Status: DC | PRN

## 2015-04-13 MED ORDER — FENTANYL CITRATE (PF) 250 MCG/5ML IJ SOLN
INTRAMUSCULAR | Status: AC
  Filled 2015-04-13: qty 5

## 2015-04-13 MED ORDER — OXYCODONE HCL 5 MG/5ML PO SOLN: 5.0000 mg | Freq: Once | ORAL | Status: DC | PRN

## 2015-04-13 MED ORDER — ONDANSETRON HCL 4 MG/2ML IJ SOLN
INTRAMUSCULAR | Status: AC
  Filled 2015-04-13: qty 2

## 2015-04-13 MED ORDER — PHENYLEPHRINE 40 MCG/ML (10ML) SYRINGE FOR IV PUSH (FOR BLOOD PRESSURE SUPPORT)
PREFILLED_SYRINGE | INTRAVENOUS | Status: AC
  Filled 2015-04-13: qty 10

## 2015-04-13 MED ORDER — EPHEDRINE SULFATE 50 MG/ML IJ SOLN
INTRAMUSCULAR | Status: DC | PRN
  Administered 2015-04-13 (×3): 5 mg via INTRAVENOUS

## 2015-04-13 MED ORDER — ACETAMINOPHEN 325 MG PO TABS: 325.0000 mg | ORAL_TABLET | ORAL | Status: DC | PRN

## 2015-04-13 MED ORDER — TRAMADOL HCL 50 MG PO TABS: 50.0000 mg | ORAL_TABLET | Freq: Four times a day (QID) | ORAL | Status: DC | PRN

## 2015-04-13 MED ORDER — PHENYLEPHRINE HCL 10 MG/ML IJ SOLN
INTRAMUSCULAR | Status: DC | PRN
  Administered 2015-04-13 (×2): 40 ug via INTRAVENOUS

## 2015-04-13 MED ORDER — ACETAMINOPHEN 160 MG/5ML PO SOLN: 325.0000 mg | ORAL | Status: DC | PRN

## 2015-04-13 MED ORDER — ONDANSETRON HCL 4 MG/2ML IJ SOLN
INTRAMUSCULAR | Status: DC | PRN
  Administered 2015-04-13: 4 mg via INTRAVENOUS

## 2015-04-13 MED ORDER — LIDOCAINE HCL (PF) 1 % IJ SOLN
INTRAMUSCULAR | Status: AC
  Filled 2015-04-13: qty 30

## 2015-04-13 SURGICAL SUPPLY — 37 items
CANISTER SUCTION 2500CC (MISCELLANEOUS) ×2 IMPLANT
CLIP TI MEDIUM 24 (CLIP) ×2 IMPLANT
CLIP TI WIDE RED SMALL 24 (CLIP) ×2 IMPLANT
CORDS BIPOLAR (ELECTRODE) IMPLANT
COVER PROBE W GEL 5X96 (DRAPES) ×2 IMPLANT
DECANTER SPIKE VIAL GLASS SM (MISCELLANEOUS) ×2 IMPLANT
ELECT REM PT RETURN 9FT ADLT (ELECTROSURGICAL) ×2
ELECTRODE REM PT RTRN 9FT ADLT (ELECTROSURGICAL) ×1 IMPLANT
GLOVE BIO SURGEON STRL SZ 6.5 (GLOVE) ×2 IMPLANT
GLOVE BIO SURGEON STRL SZ7 (GLOVE) ×2 IMPLANT
GLOVE BIOGEL PI IND STRL 6.5 (GLOVE) ×5 IMPLANT
GLOVE BIOGEL PI IND STRL 7.5 (GLOVE) ×1 IMPLANT
GLOVE BIOGEL PI IND STRL 8 (GLOVE) ×1 IMPLANT
GLOVE BIOGEL PI INDICATOR 6.5 (GLOVE) ×5
GLOVE BIOGEL PI INDICATOR 7.5 (GLOVE) ×1
GLOVE BIOGEL PI INDICATOR 8 (GLOVE) ×1
GLOVE ECLIPSE 6.5 STRL STRAW (GLOVE) ×2 IMPLANT
GLOVE SS BIOGEL STRL SZ 6.5 (GLOVE) ×2 IMPLANT
GLOVE SUPERSENSE BIOGEL SZ 6.5 (GLOVE) ×2
GOWN STRL REUS W/ TWL LRG LVL3 (GOWN DISPOSABLE) ×5 IMPLANT
GOWN STRL REUS W/TWL LRG LVL3 (GOWN DISPOSABLE) ×5
KIT BASIN OR (CUSTOM PROCEDURE TRAY) ×2 IMPLANT
KIT ROOM TURNOVER OR (KITS) ×2 IMPLANT
LIQUID BAND (GAUZE/BANDAGES/DRESSINGS) ×2 IMPLANT
NS IRRIG 1000ML POUR BTL (IV SOLUTION) ×2 IMPLANT
PACK CV ACCESS (CUSTOM PROCEDURE TRAY) ×2 IMPLANT
PAD ARMBOARD 7.5X6 YLW CONV (MISCELLANEOUS) ×4 IMPLANT
SUT MNCRL AB 4-0 PS2 18 (SUTURE) ×2 IMPLANT
SUT PROLENE 6 0 BV (SUTURE) ×2 IMPLANT
SUT PROLENE 7 0 BV 1 (SUTURE) ×2 IMPLANT
SUT SILK 2 0 SH (SUTURE) ×2 IMPLANT
SUT VIC AB 2-0 CT1 27 (SUTURE) ×1
SUT VIC AB 2-0 CT1 TAPERPNT 27 (SUTURE) ×1 IMPLANT
SUT VIC AB 3-0 SH 27 (SUTURE) ×2
SUT VIC AB 3-0 SH 27X BRD (SUTURE) ×2 IMPLANT
UNDERPAD 30X30 INCONTINENT (UNDERPADS AND DIAPERS) ×2 IMPLANT
WATER STERILE IRR 1000ML POUR (IV SOLUTION) ×2 IMPLANT

## 2015-04-13 NOTE — Anesthesia Preprocedure Evaluation (Addendum)
Anesthesia Evaluation  Patient identified by MRN, date of birth, ID band Patient awake    Reviewed: Allergy & Precautions, NPO status , Patient's Chart, lab work & pertinent test results  History of Anesthesia Complications Negative for: history of anesthetic complications  Airway Mallampati: IV  TM Distance: >3 FB Neck ROM: Full    Dental  (+) Teeth Intact,    Pulmonary shortness of breath, sleep apnea , COPD,  COPD inhaler,    breath sounds clear to auscultation       Cardiovascular hypertension, Pt. on medications and Pt. on home beta blockers (-) angina+ Past MI and +CHF  (-) Cardiac Stents + dysrhythmias  Rhythm:Regular     Neuro/Psych negative neurological ROS  negative psych ROS   GI/Hepatic Neg liver ROS, GERD  Controlled,  Endo/Other  diabetes, Type 2, Insulin DependentMorbid obesity  Renal/GU ESRF and DialysisRenal disease     Musculoskeletal   Abdominal   Peds  Hematology   Anesthesia Other Findings   Reproductive/Obstetrics                            Anesthesia Physical Anesthesia Plan  ASA: III  Anesthesia Plan: General   Post-op Pain Management:    Induction: Intravenous  Airway Management Planned: LMA  Additional Equipment: None  Intra-op Plan:   Post-operative Plan: Extubation in OR  Informed Consent: I have reviewed the patients History and Physical, chart, labs and discussed the procedure including the risks, benefits and alternatives for the proposed anesthesia with the patient or authorized representative who has indicated his/her understanding and acceptance.   Dental advisory given  Plan Discussed with: CRNA and Surgeon  Anesthesia Plan Comments:         Anesthesia Quick Evaluation

## 2015-04-13 NOTE — Discharge Instructions (Signed)
° ° °  04/13/2015 Lavette Hafen Reed-Grier [id number] [birth year]  Surgeon(s): Conrad Steger, MD  Procedure(s): RIGHT FIRST STAGE BASCILIC VEIN TRANSPOSITION  x Do not stick graft for 12 weeks

## 2015-04-13 NOTE — Interval H&P Note (Signed)
History and Physical Interval Note:  04/13/2015 9:26 AM  Jordan [name]  has presented today for surgery, with the diagnosis of End Stage Renal Disease N18.6  The various methods of treatment have been discussed with the patient and family. After consideration of risks, benefits and other options for treatment, the patient has consented to  Procedure(s): RIGHT FIRST STAGE [place] (Right) as a surgical intervention .  The patient's history has been reviewed, patient examined, no change in status, stable for surgery.  I have reviewed the patient's chart and labs.  Questions were answered to the patient's satisfaction.     Adele Barthel

## 2015-04-13 NOTE — Op Note (Signed)
    OPERATIVE NOTE   PROCEDURE: 1. right first stage basilic vein transposition (brachiobasilic arteriovenous fistula) placement  PRE-OPERATIVE DIAGNOSIS: end stage renal disease   POST-OPERATIVE DIAGNOSIS: same as above   SURGEON: Adele Barthel, MD  ASSISTANT(S): Silva Bandy, PAC   ANESTHESIA: general  ESTIMATED BLOOD LOSS: 50 cc  FINDING(S): 1. Weakly palpable thrill in the fistula at end of the case 2. Dopplerable radial signal without significant augmentation  SPECIMEN(S):  noen  INDICATIONS:   Jordan [name] is a 74 y.o. female who presents with end stage renal disease .  The patient is scheduled for right first stage basilic vein transposition.  The patient is aware the risks include but are not limited to: bleeding, infection, steal syndrome, nerve damage, ischemic monomelic neuropathy, failure to mature, and need for additional procedures.  The patient is aware of the risks of the procedure and elects to proceed forward.  DESCRIPTION: After full informed written consent was obtained from the patient, the patient was brought back to the operating room and placed supine upon the operating table.  Prior to induction, the patient received IV antibiotics.   After obtaining adequate anesthesia, the patient was then prepped and draped in the standard fashion for a right arm access procedure.  I turned my attention first to identifying the patient's basilic vein and brachial artery.  Using SonoSite guidance, the location of these vessels were marked out on the skin.   I made a transverse incision at the level of the antecubitum and dissected through the subcutaneous tissue and fascia to gain exposure of the brachial artery.  This was noted to be 3 mm in diameter externally.  This was dissected out proximally and distally and controlled with vessel loops .  I then dissected out the basilic vein.  This was noted to be 3 mm in diameter externally.  The distal segment of the vein was  ligated with a  2-0 silk, and the vein was transected.  The proximal segment was iinterrogated with serial dilators.  The vein accepted up to a 4 mm dilator without any difficulty.  I then instilled the heparinized saline into the vein and clamped it.  At this point, I reset my exposure of the brachial artery and placed the artery under tension proximally and distally.  I made an arteriotomy with a #11 blade, and then I extended the arteriotomy with a Potts scissor.  I injected heparinized saline proximal and distal to this arteriotomy.  The vein was then sewn to the artery in an end-to-side configuration with a running stitch of 7-0 Prolene.  Prior to completing this anastomosis, I allowed the vein and artery to backbleed.  There was no evidence of clot from any vessels.  I completed the anastomosis in the usual fashion and then released all vessel loops and clamps.  There was a faintly palpable thrill in the venous outflow, and there was a dopplerable radial signal.  At this point, I irrigated out the surgical wound.  There was no further active bleeding.  The subcutaneous tissue was reapproximated with a running stitch of 3-0 Vicryl.  The skin was then reapproximated with a running subcuticular stitch of 4-0 Vicryl.  The skin was then cleaned, dried, and reinforced with Dermabond.  The patient tolerated this procedure well.    COMPLICATIONS: none  CONDITION: stable   Adele Barthel, MD [facility] Office: [phone] Pager: [phone]  04/13/2015, 11:31 AM

## 2015-04-13 NOTE — H&P (View-Only) (Signed)
Postoperative Access Visit   History of Present Illness  Jordan [name] is a 74 y.o. year old female who presents for postoperative follow-up for: thrombectomy of right brachiocephalic arteriovenous fistula , ligation of right brachiocephalic arteriovenous fistula  (Date: 03/07/15).  Unfortunately, the fistula rethrombosed on the table due to a long segment stenosis with calcification within the vein.  The patient returns for re-evaluation for additional access.  The left arm has lymphedema due to a mastectomy with axillary node dissection.    Past Medical History  Diagnosis Date  . Breast cancer ([place])   . Diabetes mellitus with ESRD (end-stage renal disease) ([place])   . Hypertension   . Arthritis   . COPD (chronic obstructive pulmonary disease) ([place])   . Renal disorder     Family reports acute renal failure  . GERD (gastroesophageal reflux disease)   . CKD (chronic kidney disease)   . Systolic CHF, chronic ([place]) 12/23/2014    Past Surgical History  Procedure Laterality Date  . Breast surgery Left   . Cardiac catheterization [name] 10/19/2014    Procedure: Right/Left Heart Cath and Coronary Angiography;  Surgeon: Peter M Martinique, MD;  Location: [facility];  Service: Cardiovascular;  Laterality: [name];  . Av fistula placement Right 10/26/2014    Procedure: Right Brachiocephalic Arteriovenous FISTULA CREATION;  Surgeon: Conrad Perrinton, MD;  Location: [facility];  Service: Vascular;  Laterality: Right;  . Tee without cardioversion [name] 12/27/2014    Procedure: TRANSESOPHAGEAL ECHOCARDIOGRAM (TEE);  Surgeon: Thayer Headings, MD;  Location: [facility];  Service: Cardiovascular;  Laterality: [name];  . Peripheral vascular catheterization [name] 01/24/2015    Procedure: Fistulagram;  Surgeon: Conrad Lluveras, MD;  Location: [facility];  Service: Cardiovascular;  Laterality: [name];  . Fistula superficialization Right 01/26/2015    Procedure: Right BRACHIOCEPHALIC ARTERIOVENOUS FISTULA  SUPERFICIALIZATION with side branch ligation;  Surgeon: Conrad Wilmore, MD;  Location: [facility];  Service: Vascular;  Laterality: Right;  . Thrombectomy w/ embolectomy Right 03/07/2015    Procedure: THROMBECTOMY OF RIGHT BRACHIOCEPHALIC ARTERIOVENOUS FISTULA;  Surgeon: Conrad Warfield, MD;  Location: [facility];  Service: Vascular;  Laterality: Right;  . Ligation of arteriovenous  fistula Right 03/07/2015    Procedure: LIGATION OF RIGHT BRACHIOCEPHALIC ARTERIOVENOUS  FISTULA;  Surgeon: Conrad Woodbury, MD;  Location: [facility];  Service: Vascular;  Laterality: Right;    Social History   Social History  . Marital Status: Single    Spouse Name: [name]  . Number of Children: [name]  . Years of Education: [name]   Occupational History  . Not on file.   Social History Main Topics  . Smoking status: Never Smoker   . Smokeless tobacco: Never Used  . Alcohol Use: No  . Drug Use: No  . Sexual Activity: Not on file   Other Topics Concern  . Not on file   Social History Narrative    Family History  Problem Relation Age of Onset  . Diabetes Mother   . Hypertension Mother   . Stroke Sister   . Hypertension Sister   . Heart attack Neg Hx     Current Outpatient Prescriptions  Medication Sig Dispense Refill  . albuterol (PROVENTIL HFA;VENTOLIN HFA) 108 (90 BASE) MCG/ACT inhaler Inhale 1-2 puffs into the lungs every 6 (six) hours as needed for wheezing or shortness of breath.    Marland Kitchen aspirin 81 MG tablet Take 81 mg by mouth daily.    . calcium carbonate (  TUMS - DOSED IN MG ELEMENTAL CALCIUM) 500 MG chewable tablet Chew 1 tablet by mouth 3 (three) times daily with meals.    . carvedilol (COREG) 6.25 MG tablet Take 1 tablet (6.25 mg total) by mouth 2 (two) times daily with a meal. 60 tablet 1  . Cholecalciferol (D3-1000) 1000 UNITS tablet Take 1,000 Units by mouth 3 (three) times a week. Tues Thurs and Sat    . gabapentin (NEURONTIN) 300 MG capsule Take 1 capsule (300 mg total) by mouth at bedtime. 60 capsule 6  .  HYDROcodone-acetaminophen (NORCO/VICODIN) 5-325 MG tablet Take 1 tablet by mouth every 6 (six) hours as needed for moderate pain. 20 tablet 0  . Insulin Detemir (LEVEMIR FLEXPEN) 100 UNIT/ML Pen Inject 55 Units into the skin every morning. 15 mL 11  . insulin lispro (HUMALOG KWIKPEN) 100 UNIT/ML KiwkPen Inject 1-5 Units into the skin 3 (three) times daily as needed (blood sugar).     . Multiple Vitamins-Minerals (CENTRUM SILVER ADULT 50+ PO) Take 1 tablet by mouth daily.    . DESITIN 13 % CREA      No current facility-administered medications for this visit.     Allergies  Allergen Reactions  . Vicodin [Hydrocodone-Acetaminophen] Itching     REVIEW OF SYSTEMS:  (Positives checked otherwise negative)  CARDIOVASCULAR:   [ ]  chest pain,  [ ]  chest pressure,  [ ]  palpitations,  [ ]  shortness of breath when laying flat,  [ ]  shortness of breath with exertion,   [ ]  pain in feet when walking,  [ ]  pain in feet when laying flat, [ ]  history of blood clot in veins (DVT),  [ ]  history of phlebitis,  [ ]  swelling in legs,  [ ]  varicose veins  PULMONARY:   [ ]  productive cough,  [ ]  asthma,  [ ]  wheezing  NEUROLOGIC:   [ ]  weakness in arms or legs,  [ ]  numbness in arms or legs,  [ ]  difficulty speaking or slurred speech,  [ ]  temporary loss of vision in one eye,  [ ]  dizziness  HEMATOLOGIC:   [ ]  bleeding problems,  [ ]  problems with blood clotting too easily  MUSCULOSKEL:   [ ]  joint pain, [ ]  joint swelling  GASTROINTEST:   [ ]  vomiting blood,  [ ]  blood in stool     GENITOURINARY:   [ ]  burning with urination,  [ ]  blood in urine [x]  ESRD-HD: T/R/S  PSYCHIATRIC:   [ ]  history of major depression  INTEGUMENTARY:   [ ]  rashes,  [ ]  ulcers  CONSTITUTIONAL:   [ ]  fever,  [ ]  chills    For VQI Use Only  PRE-ADM LIVING: Home  AMB STATUS: Ambulatory  Physical Examination Filed Vitals:   03/25/15 1556  BP: 122/68  Pulse: 75  Temp: 98.2 F (36.8 C)    Resp: 16   Pulmonary: Sym exp, good air movt, CTAB, no rales, rhonchi, & wheezing  Cardiac: RRR, Nl S1, S2, no Murmurs, rubs or gallops  RUE: incisions are healed, skin feels warm, hand grip is 5/5, sensation in digits is intact, no palpable thrill, no bruit can be auscultated, On Sonosite: both basilic and brachial vein visualized   Medical Decision Making  Jordan [name] is a 74 y.o. year old female who presents s/p thrombectomy and ligation of R BC AVF, ESRD-HD   Next access option would be a staged R BVT vs BrVT.   Risk, benefits, and alternatives to  access surgery were discussed.   The patient is aware the risks include but are not limited to: bleeding, infection, steal syndrome, nerve damage, ischemic monomelic neuropathy, failure to mature, need for additional procedures, death and stroke.   The patient is aware that this is a staged procedure. The patient agrees to proceed forward with the procedure on 28 DEC 16.  Thank you for allowing [place] to participate in this patient's care.  Adele Barthel, MD [facility] Office: [phone] Pager: [phone]  03/25/2015, 4:34 PM

## 2015-04-13 NOTE — Anesthesia Procedure Notes (Signed)
Procedure Name: LMA Insertion Date/Time: 04/13/2015 10:28 AM Performed by: Scheryl Darter Pre-anesthesia Checklist: Patient identified, Emergency Drugs available, Suction available and Patient being monitored Patient Re-evaluated:Patient Re-evaluated prior to inductionPreoxygenation: Pre-oxygenation with 100% oxygen Intubation Type: IV induction Ventilation: Mask ventilation without difficulty LMA: LMA inserted LMA Size: 4.0 Number of attempts: 1 Placement Confirmation: positive ETCO2 and breath sounds checked- equal and bilateral Tube secured with: Tape Dental Injury: Teeth and Oropharynx as per pre-operative assessment

## 2015-04-13 NOTE — Progress Notes (Signed)
IV team at bedside to deaccess dialysis cath for discharge.

## 2015-04-13 NOTE — Transfer of Care (Signed)
Immediate Anesthesia Transfer of Care Note  Patient: Jordan [name]  Procedure(s) Performed: Procedure(s): RIGHT FIRST STAGE BASCILIC VEIN TRANSPOSITION (Right)  Patient Location: PACU  Anesthesia Type:General  Level of Consciousness: awake, alert , oriented and sedated  Airway & Oxygen Therapy: Patient Spontanous Breathing and Patient connected to nasal cannula oxygen  Post-op Assessment: Report given to RN, Post -op Vital signs reviewed and stable and Patient moving all extremities  Post vital signs: Reviewed and stable  Last Vitals:  Filed Vitals:   04/13/15 0940  BP: 122/43  Pulse: 67  Temp: 36.3 C  Resp: 16    Complications: No apparent anesthesia complications

## 2015-04-14 ENCOUNTER — Encounter (HOSPITAL_COMMUNITY): Payer: Self-pay | Admitting: Vascular Surgery

## 2015-04-14 ENCOUNTER — Telehealth: Payer: Self-pay | Admitting: Vascular Surgery

## 2015-04-14 NOTE — Anesthesia Postprocedure Evaluation (Signed)
Anesthesia Post Note  Patient: Jordan [name]  Procedure(s) Performed: Procedure(s) (LRB): RIGHT FIRST STAGE [place] (Right)  Patient location during evaluation: PACU Anesthesia Type: General Level of consciousness: awake Pain management: pain level controlled Vital Signs Assessment: post-procedure vital signs reviewed and stable Respiratory status: spontaneous breathing Cardiovascular status: stable Postop Assessment: no signs of nausea or vomiting Anesthetic complications: no    Last Vitals:  Filed Vitals:   04/13/15 1204 04/13/15 1230  BP: 118/56   Pulse: 63   Temp:  36.6 C  Resp: 14     Last Pain: There were no vitals filed for this visit.               Saburo Luger

## 2015-04-14 NOTE — Telephone Encounter (Signed)
spoke with pt, dpm

## 2015-04-14 NOTE — Telephone Encounter (Signed)
-----   Message from Mena Goes, RN sent at 04/13/2015  1:02 PM EST ----- Regarding: Schedule   ----- Message -----    From: Alvia Grove, PA-C    Sent: 04/13/2015  11:44 AM      To: Vvs Charge Pool  S/p right 1st stage BVT 04/13/15  F/u in 6 weeks with Dr. Bridgett Larsson. No duplex.  Thanks Maudie Mercury

## 2015-04-21 ENCOUNTER — Telehealth: Payer: Self-pay | Admitting: Family Medicine

## 2015-04-21 NOTE — Telephone Encounter (Signed)
Left msg for pt to call office to schedule AWV.msn °

## 2015-04-21 NOTE — Telephone Encounter (Signed)
I scheduled pt for her AWV.

## 2015-05-09 ENCOUNTER — Ambulatory Visit: Payer: Medicare (Managed Care)

## 2015-05-11 ENCOUNTER — Encounter: Payer: Self-pay | Admitting: Cardiology

## 2015-05-11 ENCOUNTER — Ambulatory Visit (INDEPENDENT_AMBULATORY_CARE_PROVIDER_SITE_OTHER): Payer: Medicare (Managed Care) | Admitting: Cardiology

## 2015-05-11 VITALS — BP 112/60 | HR 86 | Ht 65.0 in | Wt 271.5 lb

## 2015-05-11 DIAGNOSIS — I10 Essential (primary) hypertension: Secondary | ICD-10-CM

## 2015-05-11 DIAGNOSIS — N186 End stage renal disease: Secondary | ICD-10-CM | POA: Diagnosis not present

## 2015-05-11 DIAGNOSIS — I447 Left bundle-branch block, unspecified: Secondary | ICD-10-CM | POA: Diagnosis not present

## 2015-05-11 DIAGNOSIS — I272 Other secondary pulmonary hypertension: Secondary | ICD-10-CM

## 2015-05-11 DIAGNOSIS — I5032 Chronic diastolic (congestive) heart failure: Secondary | ICD-10-CM | POA: Insufficient documentation

## 2015-05-11 DIAGNOSIS — Z992 Dependence on renal dialysis: Secondary | ICD-10-CM

## 2015-05-11 NOTE — Progress Notes (Signed)
Cardiology Office Note   Date:  05/11/2015   ID:  Jordan [name], DOB 1940/06/27, MRN [id number]  PCP:  [name], MD  Cardiologist:  Jameis Newsham Martinique, MD   Chief Complaint  Patient presents with  . 3 month visit    pt c/o SOB on exertion, swelling in bilateral legs/feet/ankles//no other questions or concerns.      History of Present Illness: Jordan [name] is a 75 y.o. female who presents for follow up of CHF and pulmonary HTN. She has now moved from [place] to [place]. She has a history of ESRD on dialysis, HTN, COPD, DM, and breast CA s/p chemo and RT. She was admitted in June 2016 after an episode of unresponsiveness. Echo showed an EF of 30% with pulmonary HTN. She underwent cardiac cath. This demonstrated severe pulmonary HTN with elevated filling pressures and normal cardiac output. No significant CAD.  Since that hospitalization she reports 5 surgical procedures for vascular access. In September she had bacteremia. TEE was done showing no vegetations. EF was 50-55%.   On follow up today she reports her breathing is better. She does report episodes of hypotension on dialysis and she feels like she is going to pass out. Sometimes has chest pain associated with this. She states sometimes she has to have extra dialysis to keep her fluid down. No palpitations. Mild edema.    Past Medical History  Diagnosis Date  . Breast cancer ([place])   . Diabetes mellitus with ESRD (end-stage renal disease) ([facility])   . Hypertension   . Arthritis   . COPD (chronic obstructive pulmonary disease) ([place])   . Renal disorder     Family reports acute renal failure  . GERD (gastroesophageal reflux disease)   . CKD (chronic kidney disease)   . Systolic CHF, chronic ([place]) 12/23/2014    Past Surgical History  Procedure Laterality Date  . Breast surgery Left   . Cardiac catheterization [name] 10/19/2014    Procedure: Right/Left Heart Cath and Coronary Angiography;  Surgeon: Deasha Clendenin M Martinique, MD;   Location: [facility];  Service: Cardiovascular;  Laterality: [name];  . Av fistula placement Right 10/26/2014    Procedure: Right Brachiocephalic Arteriovenous FISTULA CREATION;  Surgeon: Conrad Gilbert, MD;  Location: [facility];  Service: Vascular;  Laterality: Right;  . Tee without cardioversion [name] 12/27/2014    Procedure: TRANSESOPHAGEAL ECHOCARDIOGRAM (TEE);  Surgeon: Thayer Headings, MD;  Location: [facility];  Service: Cardiovascular;  Laterality: [name];  . Peripheral vascular catheterization [name] 01/24/2015    Procedure: Fistulagram;  Surgeon: Conrad [place]. [place], MD;  Location: [facility];  Service: Cardiovascular;  Laterality: [name];  . Fistula superficialization Right 01/26/2015    Procedure: Right BRACHIOCEPHALIC ARTERIOVENOUS FISTULA SUPERFICIALIZATION with side branch ligation;  Surgeon: Conrad [place], MD;  Location: [facility];  Service: Vascular;  Laterality: Right;  . Thrombectomy w/ embolectomy Right 03/07/2015    Procedure: THROMBECTOMY OF RIGHT BRACHIOCEPHALIC ARTERIOVENOUS FISTULA;  Surgeon: Conrad [place], MD;  Location: [facility];  Service: Vascular;  Laterality: Right;  . Ligation of arteriovenous  fistula Right 03/07/2015    Procedure: LIGATION OF RIGHT BRACHIOCEPHALIC ARTERIOVENOUS  FISTULA;  Surgeon: Conrad Scotland, MD;  Location: [facility];  Service: Vascular;  Laterality: Right;  . Bascilic vein transposition Right 04/13/2015    Procedure: RIGHT FIRST STAGE [place];  Surgeon: Conrad Anthony, MD;  Location: [facility];  Service: Vascular;  Laterality: Right;     Current Outpatient Prescriptions  Medication Sig Dispense Refill  . albuterol (PROVENTIL HFA;VENTOLIN HFA) 108 (90 BASE) MCG/ACT inhaler Inhale 1-2 puffs into the lungs every 6 (six) hours as needed for wheezing or shortness of breath.    Marland Kitchen albuterol-ipratropium (COMBIVENT) 18-103 MCG/ACT inhaler Inhale 1 puff into the lungs daily as needed.    Marland Kitchen aspirin 81 MG tablet Take 81 mg by mouth daily.    . calcium carbonate  (TUMS EX) 750 MG chewable tablet Chew 1 tablet by mouth daily.    . carvedilol (COREG) 6.25 MG tablet Take 1 tablet (6.25 mg total) by mouth 2 (two) times daily with a meal. 60 tablet 1  . gabapentin (NEURONTIN) 300 MG capsule Take 1 capsule (300 mg total) by mouth at bedtime. 60 capsule 6  . Insulin Detemir (LEVEMIR FLEXPEN) 100 UNIT/ML Pen Inject 55 Units into the skin every morning. 15 mL 11  . insulin lispro (HUMALOG KWIKPEN) 100 UNIT/ML KiwkPen Inject 1-5 Units into the skin 3 (three) times daily as needed (blood sugar).     . Multiple Vitamins-Minerals (CENTRUM SILVER ADULT 50+ PO) Take 1 tablet by mouth daily.    . traMADol (ULTRAM) 50 MG tablet Take 1 tablet (50 mg total) by mouth every 6 (six) hours as needed. 20 tablet 1   No current facility-administered medications for this visit.    Allergies:   Vicodin    Social History:  The patient  reports that she has never smoked. She has never used smokeless tobacco. She reports that she does not drink alcohol or use illicit drugs.   Family History:  The patient's family history includes Diabetes in her mother; Hypertension in her mother and sister; Stroke in her sister. There is no history of Heart attack.    ROS:  Please see the history of present illness.   Otherwise, review of systems are positive for none.   All other systems are reviewed and negative.    PHYSICAL EXAM: VS:  BP 112/60 mmHg  Pulse 86  Ht 5\' 5"  (1.651 m)  Wt 123.152 kg (271 lb 8 oz)  BMI 45.18 kg/m2 , BMI Body mass index is 45.18 kg/(m^2). GEN: morbidly obese BF in NAD, in no acute distress HEENT: normal Neck: no JVD, carotid bruits, or masses Cardiac: RRR; no murmurs, rubs, or gallops,no edema  Chest: access right subclavian area Respiratory:  clear to auscultation bilaterally, normal work of breathing GI: soft, nontender, nondistended, + BS MS: no deformity or atrophy Skin: warm and dry, no rash Neuro:  Strength and sensation are intact Psych: euthymic  mood, full affect   EKG:  EKG is not ordered today. The ekg ordered today demonstrates [name]   Recent Labs: 10/12/2014: ALT 37; B Natriuretic Peptide 143.8* 12/23/2014: Magnesium 2.0 12/28/2014: Platelets 298 01/24/2015: BUN 40*; Creatinine, Ser 7.60* 04/13/2015: Hemoglobin 12.2; Potassium 4.4; Sodium 139    Lipid Panel    Component Value Date/Time   CHOL 199 10/12/2014 0745   TRIG 113 10/12/2014 0745   HDL 53 10/12/2014 0745   CHOLHDL 3.8 10/12/2014 0745   VLDL 23 10/12/2014 0745   LDLCALC 123* 10/12/2014 0745      Wt Readings from Last 3 Encounters:  05/11/15 123.152 kg (271 lb 8 oz)  04/13/15 120.203 kg (265 lb)  03/25/15 120.203 kg (265 lb)      Other studies Reviewed: Additional studies/ records that were reviewed today include: none. Review of the above records demonstrates: [name]   ASSESSMENT AND PLAN:  1.  Chronic diastolic CHF- exacerbated  by ESRD. I personally reviewed her prior TTE and TEE studies. I believe the TEE is accurate as far as her EF is concerned. The TTE was very limited. Volume status maintained with dialysis. Reinforced importance of sodium restriction.  2. Severe pulmonary HTN  3. ESRD on HD. With hypotension during dialysis I have recommended she not take Coreg on dialysis days.   4. DM on insulin.   5. HTN. Controlled.   6. Morbid obesity.   Current medicines are reviewed at length with the patient today.  The patient does not have concerns regarding medicines.  The following changes have been made:  See above.  Labs/ tests ordered today include: none  No orders of the defined types were placed in this encounter.     Disposition:   FU with Dr. Martinique in 6 months  Signed, Elleana Stillson Martinique, MD  05/11/2015 12:29 PM    [facility] [address], [place], [place], [address] Phone [phone], Fax [phone]

## 2015-05-11 NOTE — Patient Instructions (Signed)
Don't take Coreg on your dialysis days.  Otherwise continue your current therapy  I will see you in 6 months.

## 2015-05-20 ENCOUNTER — Encounter: Payer: Self-pay | Admitting: Vascular Surgery

## 2015-05-27 ENCOUNTER — Ambulatory Visit (INDEPENDENT_AMBULATORY_CARE_PROVIDER_SITE_OTHER): Payer: Medicare (Managed Care) | Admitting: Vascular Surgery

## 2015-05-27 ENCOUNTER — Encounter: Payer: Self-pay | Admitting: Vascular Surgery

## 2015-05-27 VITALS — BP 100/62 | HR 76 | Temp 98.6°F | Resp 16 | Ht 65.0 in | Wt 271.0 lb

## 2015-05-27 DIAGNOSIS — Z992 Dependence on renal dialysis: Secondary | ICD-10-CM

## 2015-05-27 DIAGNOSIS — N186 End stage renal disease: Secondary | ICD-10-CM

## 2015-05-27 NOTE — Progress Notes (Signed)
    Postoperative Access Visit   History of Present Illness  Jordan [name] is a 75 y.o. year old female who presents for postoperative follow-up for: R 1st BVT (Date: 04/13/15).  The patient's wounds are healed.  The patient notes no steal symptoms.  The patient is able to complete their activities of daily living.  The patient's current symptoms are: none.  For VQI Use Only  PRE-ADM LIVING: Home  AMB STATUS: Ambulatory  Physical Examination Filed Vitals:   05/27/15 1219  BP: 100/62  Pulse: 76  Temp: 98.6 F (37 C)  Resp: 16    RUE: Incision is healed, skin feels warm, hand grip is 5/5, sensation in digits is intact, palpable thrill, bruit can be auscultated, on Sonosite: basilic vein 99991111 mm with proximally segment > 6 mm, distal cubital vein component only 4-4.5 mm  Medical Decision Making  Jordan [name] is a 75 y.o. year old female who presents s/p R 1st BVT.   Would give fistula another 4 weeks to see if the cubital vein segment will dilate, as I suspect her adequate fistula length might be on the short side currently.  Follow in 4 weeks for recheck.  Thank you for allowing [place] to participate in this patient's care.  Adele Barthel, MD [facility] Office: [phone] Pager: [phone]  05/27/2015, 1:31 PM

## 2015-06-20 ENCOUNTER — Encounter: Payer: Self-pay | Admitting: Vascular Surgery

## 2015-06-24 ENCOUNTER — Ambulatory Visit (INDEPENDENT_AMBULATORY_CARE_PROVIDER_SITE_OTHER): Payer: Medicare (Managed Care) | Admitting: Vascular Surgery

## 2015-06-24 ENCOUNTER — Encounter: Payer: Self-pay | Admitting: Vascular Surgery

## 2015-06-24 ENCOUNTER — Other Ambulatory Visit: Payer: Self-pay

## 2015-06-24 VITALS — BP 97/60 | HR 79 | Temp 97.2°F | Resp 16 | Ht 65.0 in | Wt 269.0 lb

## 2015-06-24 DIAGNOSIS — Z992 Dependence on renal dialysis: Secondary | ICD-10-CM

## 2015-06-24 DIAGNOSIS — N186 End stage renal disease: Secondary | ICD-10-CM

## 2015-06-24 NOTE — Progress Notes (Signed)
Postoperative Access Visit   History of Present Illness  Jordan [name] is a 75 y.o. year old female who presents for postoperative follow-up for: R 1st stage BVT (Date: 04/13/15).  The patient's wounds are healed.  The patient notes steal symptoms.  The patient is able to complete their activities of daily living.  The patient's current symptoms are: none   Past Medical History  Diagnosis Date  . Breast cancer ([facility])   . Diabetes mellitus with ESRD (end-stage renal disease) ([facility])   . Hypertension   . Arthritis   . COPD (chronic obstructive pulmonary disease) ([facility])   . Renal disorder     Family reports acute renal failure  . GERD (gastroesophageal reflux disease)   . CKD (chronic kidney disease)   . Systolic CHF, chronic ([facility]) 12/23/2014    Past Surgical History  Procedure Laterality Date  . Breast surgery Left   . Cardiac catheterization [name] 10/19/2014    Procedure: Right/Left Heart Cath and Coronary Angiography;  Surgeon: Peter M Martinique, MD;  Location: [facility];  Service: Cardiovascular;  Laterality: [name];  . Av fistula placement Right 10/26/2014    Procedure: Right Brachiocephalic Arteriovenous FISTULA CREATION;  Surgeon: Conrad Wellton, MD;  Location: [facility];  Service: Vascular;  Laterality: Right;  . Tee without cardioversion [name] 12/27/2014    Procedure: TRANSESOPHAGEAL ECHOCARDIOGRAM (TEE);  Surgeon: Thayer Headings, MD;  Location: [facility];  Service: Cardiovascular;  Laterality: [name];  . Peripheral vascular catheterization [name] 01/24/2015    Procedure: Fistulagram;  Surgeon: Conrad Atlanta, MD;  Location: [facility];  Service: Cardiovascular;  Laterality: [name];  . Fistula superficialization Right 01/26/2015    Procedure: Right BRACHIOCEPHALIC ARTERIOVENOUS FISTULA SUPERFICIALIZATION with side branch ligation;  Surgeon: Conrad Hazel, MD;  Location: [facility];  Service: Vascular;  Laterality: Right;  . Thrombectomy w/ embolectomy Right 03/07/2015    Procedure:  THROMBECTOMY OF RIGHT BRACHIOCEPHALIC ARTERIOVENOUS FISTULA;  Surgeon: Conrad Russell, MD;  Location: [facility];  Service: Vascular;  Laterality: Right;  . Ligation of arteriovenous  fistula Right 03/07/2015    Procedure: LIGATION OF RIGHT BRACHIOCEPHALIC ARTERIOVENOUS  FISTULA;  Surgeon: Conrad La Jun[place], MD;  Location: [facility];  Service: Vascular;  Laterality: Right;  . Bascilic vein transposition Right 04/13/2015    Procedure: RIGHT FIRST STAGE [place];  Surgeon: Conrad [place], MD;  Location: [facility];  Service: Vascular;  Laterality: Right;    Social History   Social History  . Marital Status: Single    Spouse Name: [name]  . Number of Children: [name]  . Years of Education: [name]   Occupational History  . Not on file.   Social History Main Topics  . Smoking status: Never Smoker   . Smokeless tobacco: Never Used  . Alcohol Use: No  . Drug Use: No  . Sexual Activity: Not on file   Other Topics Concern  . Not on file   Social History Narrative    Family History  Problem Relation Age of Onset  . Diabetes Mother   . Hypertension Mother   . Stroke Sister   . Hypertension Sister   . Heart attack Neg Hx     Current Outpatient Prescriptions  Medication Sig Dispense Refill  . aspirin 81 MG tablet Take 81 mg by mouth daily.    . calcium carbonate (TUMS EX) 750 MG chewable tablet Chew 1 tablet by mouth daily.    . carvedilol (COREG) 6.25 MG  tablet Take 1 tablet (6.25 mg total) by mouth 2 (two) times daily with a meal. 60 tablet 1  . gabapentin (NEURONTIN) 300 MG capsule Take 1 capsule (300 mg total) by mouth at bedtime. 60 capsule 6  . Insulin Detemir (LEVEMIR FLEXPEN) 100 UNIT/ML Pen Inject 55 Units into the skin every morning. 15 mL 11  . insulin lispro (HUMALOG KWIKPEN) 100 UNIT/ML KiwkPen Inject 1-5 Units into the skin 3 (three) times daily as needed (blood sugar).     . Multiple Vitamins-Minerals (CENTRUM SILVER ADULT 50+ PO) Take 1 tablet by mouth daily.    Marland Kitchen albuterol  (PROVENTIL HFA;VENTOLIN HFA) 108 (90 BASE) MCG/ACT inhaler Inhale 1-2 puffs into the lungs every 6 (six) hours as needed for wheezing or shortness of breath. Reported on 06/24/2015    . albuterol-ipratropium (COMBIVENT) 18-103 MCG/ACT inhaler Inhale 1 puff into the lungs daily as needed. Reported on 06/24/2015    . traMADol (ULTRAM) 50 MG tablet Take 1 tablet (50 mg total) by mouth every 6 (six) hours as needed. (Patient not taking: Reported on 06/24/2015) 20 tablet 1   No current facility-administered medications for this visit.     Allergies  Allergen Reactions  . Vicodin [Hydrocodone-Acetaminophen] Itching     REVIEW OF SYSTEMS:  (Positives checked otherwise negative)  CARDIOVASCULAR:   [ ]  chest pain,  [ ]  chest pressure,  [ ]  palpitations,  [ ]  shortness of breath when laying flat,  [ ]  shortness of breath with exertion,   [ ]  pain in feet when walking,  [ ]  pain in feet when laying flat, [ ]  history of blood clot in veins (DVT),  [ ]  history of phlebitis,  [ ]  swelling in legs,  [ ]  varicose veins  PULMONARY:   [ ]  productive cough,  [ ]  asthma,  [ ]  wheezing  NEUROLOGIC:   [ ]  weakness in arms or legs,  [ ]  numbness in arms or legs,  [ ]  difficulty speaking or slurred speech,  [ ]  temporary loss of vision in one eye,  [ ]  dizziness  HEMATOLOGIC:   [ ]  bleeding problems,  [ ]  problems with blood clotting too easily  MUSCULOSKEL:   [ ]  joint pain, [ ]  joint swelling  GASTROINTEST:   [ ]  vomiting blood,  [ ]  blood in stool     GENITOURINARY:   [ ]  burning with urination,  [ ]  blood in urine [x]  ESRD-HD: T/R/S  PSYCHIATRIC:   [ ]  history of major depression  INTEGUMENTARY:   [ ]  rashes,  [ ]  ulcers  CONSTITUTIONAL:   [ ]  fever,  [ ]  chills    For VQI Use Only  PRE-ADM LIVING: Home  AMB STATUS: Ambulatory   Physical Examination Filed Vitals:   06/24/15 1121  BP: 97/60  Pulse: 79  Temp: 97.2 F (36.2 C)  Resp: 16   Pulmonary: Sym exp,  good air movt, CTAB, no rales, rhonchi, & wheezing  Cardiac: RRR, Nl S1, S2, no Murmurs, rubs or gallops  RUE: Incision is healed, skin feels warm, hand grip is 5/5, sensation in digits is intact, palpable thrill, bruit can be auscultated, fistula appears to 5-6 mm in diameter, extremely deep (>2 cm) starting mid-arm  Medical Decision Making  Jordan [name] is a 75 y.o. year old female who presents s/p R 1st stage BVT.   The patient's access is transposition, i.e. R 2nd stage BVT.  The patient is scheduled for 27 MAR 16. Risk, benefits,  and alternatives to access surgery were discussed.   The patient is aware the risks include but are not limited to: bleeding, infection, steal syndrome, nerve damage, ischemic monomelic neuropathy, failure to mature, need for additional procedures, death and stroke.   The patient agrees to proceed forward with the procedure.   Thank you for allowing [place] to participate in this patient's care.  Adele Barthel, MD [facility] Office: [phone] Pager: [phone]  06/24/2015, 12:16 PM

## 2015-07-08 ENCOUNTER — Encounter (HOSPITAL_COMMUNITY): Payer: Self-pay | Admitting: *Deleted

## 2015-07-08 NOTE — Progress Notes (Signed)
Pt denies any acute cardiopulmonary issues. When reviewing pt history pt stated " nothing has changed, I know what to do, I have been through this five times." Pt stated that she was given pre-op  Instructions regarding her insulin. Pt stated that her fasting blood glucose " usually runs around 130-150."  Pt made aware of diabetes protocol to check BS , interventions for a blood sugar <70 and >220 and phone # to SS. Pt made aware to stop vitamins, fish oil, herbal medications and NSAID's. Pt verbalized understanding of all pre-op instructions.

## 2015-07-10 MED ORDER — DEXTROSE 5 % IV SOLN
1.5000 g | INTRAVENOUS | Status: AC
  Administered 2015-07-11: 1.5 g via INTRAVENOUS
  Filled 2015-07-10: qty 1.5

## 2015-07-11 ENCOUNTER — Ambulatory Visit (HOSPITAL_COMMUNITY): Payer: Medicare (Managed Care) | Admitting: Anesthesiology

## 2015-07-11 ENCOUNTER — Ambulatory Visit (HOSPITAL_COMMUNITY)
Admission: RE | Admit: 2015-07-11 | Discharge: 2015-07-11 | Disposition: A | Discharge status: Home / Self Care | Payer: Medicare (Managed Care) | Source: Ambulatory Visit | Attending: Vascular Surgery | Admitting: Vascular Surgery

## 2015-07-11 ENCOUNTER — Encounter (HOSPITAL_COMMUNITY): Payer: Self-pay | Admitting: Surgery

## 2015-07-11 ENCOUNTER — Encounter (HOSPITAL_COMMUNITY)
Admission: RE | Disposition: A | Discharge status: Home / Self Care | Payer: Self-pay | Source: Ambulatory Visit | Attending: Vascular Surgery

## 2015-07-11 DIAGNOSIS — E1122 Type 2 diabetes mellitus with diabetic chronic kidney disease: Secondary | ICD-10-CM | POA: Diagnosis not present

## 2015-07-11 DIAGNOSIS — Z853 Personal history of malignant neoplasm of breast: Secondary | ICD-10-CM | POA: Diagnosis not present

## 2015-07-11 DIAGNOSIS — I132 Hypertensive heart and chronic kidney disease with heart failure and with stage 5 chronic kidney disease, or end stage renal disease: Secondary | ICD-10-CM | POA: Insufficient documentation

## 2015-07-11 DIAGNOSIS — J449 Chronic obstructive pulmonary disease, unspecified: Secondary | ICD-10-CM | POA: Diagnosis not present

## 2015-07-11 DIAGNOSIS — Z794 Long term (current) use of insulin: Secondary | ICD-10-CM | POA: Insufficient documentation

## 2015-07-11 DIAGNOSIS — N185 Chronic kidney disease, stage 5: Secondary | ICD-10-CM | POA: Diagnosis not present

## 2015-07-11 DIAGNOSIS — N186 End stage renal disease: Secondary | ICD-10-CM | POA: Diagnosis not present

## 2015-07-11 DIAGNOSIS — I120 Hypertensive chronic kidney disease with stage 5 chronic kidney disease or end stage renal disease: Secondary | ICD-10-CM | POA: Diagnosis present

## 2015-07-11 DIAGNOSIS — Z992 Dependence on renal dialysis: Secondary | ICD-10-CM | POA: Diagnosis not present

## 2015-07-11 DIAGNOSIS — I5022 Chronic systolic (congestive) heart failure: Secondary | ICD-10-CM | POA: Insufficient documentation

## 2015-07-11 DIAGNOSIS — Z7982 Long term (current) use of aspirin: Secondary | ICD-10-CM | POA: Diagnosis not present

## 2015-07-11 DIAGNOSIS — I252 Old myocardial infarction: Secondary | ICD-10-CM | POA: Insufficient documentation

## 2015-07-11 HISTORY — PX: BASCILIC VEIN TRANSPOSITION: SHX5742

## 2015-07-11 HISTORY — DX: Sleep apnea, unspecified: G47.30

## 2015-07-11 LAB — GLUCOSE, CAPILLARY
GLUCOSE-CAPILLARY: 174 mg/dL — AB (ref 65–99)
Glucose-Capillary: 171 mg/dL — ABNORMAL HIGH (ref 65–99)

## 2015-07-11 LAB — POCT I-STAT 4, (NA,K, GLUC, HGB,HCT)
Glucose, Bld: 180 mg/dL — ABNORMAL HIGH (ref 65–99)
HEMATOCRIT: 38 % (ref 36.0–46.0)
HEMOGLOBIN: 12.9 g/dL (ref 12.0–15.0)
Potassium: 4.1 mmol/L (ref 3.5–5.1)
SODIUM: 139 mmol/L (ref 135–145)

## 2015-07-11 SURGERY — TRANSPOSITION, VEIN, BASILIC
Anesthesia: General | Laterality: Right

## 2015-07-11 MED ORDER — EPHEDRINE SULFATE 50 MG/ML IJ SOLN
INTRAMUSCULAR | Status: AC
  Filled 2015-07-11: qty 1

## 2015-07-11 MED ORDER — EPHEDRINE SULFATE 50 MG/ML IJ SOLN
INTRAMUSCULAR | Status: DC | PRN
  Administered 2015-07-11 (×2): 5 mg via INTRAVENOUS

## 2015-07-11 MED ORDER — SODIUM CHLORIDE 0.9 % IJ SOLN
INTRAMUSCULAR | Status: AC
  Filled 2015-07-11: qty 10

## 2015-07-11 MED ORDER — HEPARIN SODIUM (PORCINE) 1000 UNIT/ML IJ SOLN
INTRAMUSCULAR | Status: AC
  Filled 2015-07-11: qty 1

## 2015-07-11 MED ORDER — ONDANSETRON HCL 4 MG/2ML IJ SOLN
INTRAMUSCULAR | Status: AC
  Filled 2015-07-11: qty 2

## 2015-07-11 MED ORDER — 0.9 % SODIUM CHLORIDE (POUR BTL) OPTIME
TOPICAL | Status: DC | PRN
  Administered 2015-07-11: 1000 mL

## 2015-07-11 MED ORDER — FENTANYL CITRATE (PF) 250 MCG/5ML IJ SOLN
INTRAMUSCULAR | Status: AC
Start: 2015-07-11 — End: 2015-07-11
  Filled 2015-07-11: qty 5

## 2015-07-11 MED ORDER — PHENYLEPHRINE 40 MCG/ML (10ML) SYRINGE FOR IV PUSH (FOR BLOOD PRESSURE SUPPORT)
PREFILLED_SYRINGE | INTRAVENOUS | Status: AC
  Filled 2015-07-11: qty 10

## 2015-07-11 MED ORDER — PHENYLEPHRINE HCL 10 MG/ML IJ SOLN
INTRAMUSCULAR | Status: DC | PRN
  Administered 2015-07-11: 40 ug via INTRAVENOUS
  Administered 2015-07-11: 80 ug via INTRAVENOUS

## 2015-07-11 MED ORDER — PROPOFOL 10 MG/ML IV BOLUS
INTRAVENOUS | Status: AC
  Filled 2015-07-11: qty 20

## 2015-07-11 MED ORDER — TRAMADOL HCL 50 MG PO TABS
ORAL_TABLET | ORAL | Status: AC
  Filled 2015-07-11: qty 1

## 2015-07-11 MED ORDER — MIDAZOLAM HCL 5 MG/5ML IJ SOLN
INTRAMUSCULAR | Status: DC | PRN
  Administered 2015-07-11: 1 mg via INTRAVENOUS

## 2015-07-11 MED ORDER — SODIUM CHLORIDE 0.9 % IV SOLN
INTRAVENOUS | Status: DC | PRN
  Administered 2015-07-11: 08:00:00

## 2015-07-11 MED ORDER — LIDOCAINE HCL (CARDIAC) 20 MG/ML IV SOLN
INTRAVENOUS | Status: AC
  Filled 2015-07-11: qty 5

## 2015-07-11 MED ORDER — MIDAZOLAM HCL 2 MG/2ML IJ SOLN
INTRAMUSCULAR | Status: AC
  Filled 2015-07-11: qty 2

## 2015-07-11 MED ORDER — TRAMADOL HCL 50 MG PO TABS
50.0000 mg | ORAL_TABLET | Freq: Once | ORAL | Status: AC
  Administered 2015-07-11: 50 mg via ORAL

## 2015-07-11 MED ORDER — FENTANYL CITRATE (PF) 100 MCG/2ML IJ SOLN
25.0000 ug | INTRAMUSCULAR | Status: DC | PRN
  Administered 2015-07-11 (×2): 25 ug via INTRAVENOUS

## 2015-07-11 MED ORDER — TRAMADOL HCL 50 MG PO TABS: 50.0000 mg | ORAL_TABLET | Freq: Four times a day (QID) | ORAL | Status: DC | PRN

## 2015-07-11 MED ORDER — ARTIFICIAL TEARS OP OINT
TOPICAL_OINTMENT | OPHTHALMIC | Status: DC | PRN
  Administered 2015-07-11: 1 via OPHTHALMIC

## 2015-07-11 MED ORDER — FENTANYL CITRATE (PF) 100 MCG/2ML IJ SOLN
INTRAMUSCULAR | Status: AC
  Administered 2015-07-11: 25 ug via INTRAVENOUS
  Filled 2015-07-11: qty 2

## 2015-07-11 MED ORDER — CHLORHEXIDINE GLUCONATE CLOTH 2 % EX PADS: 6.0000 | MEDICATED_PAD | Freq: Once | CUTANEOUS | Status: DC

## 2015-07-11 MED ORDER — LIDOCAINE HCL (CARDIAC) 20 MG/ML IV SOLN
INTRAVENOUS | Status: DC | PRN
  Administered 2015-07-11: 60 mg via INTRAVENOUS

## 2015-07-11 MED ORDER — PROPOFOL 10 MG/ML IV BOLUS
INTRAVENOUS | Status: DC | PRN
  Administered 2015-07-11: 30 mg via INTRAVENOUS
  Administered 2015-07-11 (×2): 100 mg via INTRAVENOUS

## 2015-07-11 MED ORDER — PROMETHAZINE HCL 25 MG/ML IJ SOLN: 6.2500 mg | INTRAMUSCULAR | Status: DC | PRN

## 2015-07-11 MED ORDER — SODIUM CHLORIDE 0.9 % IV SOLN
INTRAVENOUS | Status: DC
  Administered 2015-07-11: 07:00:00 via INTRAVENOUS

## 2015-07-11 MED ORDER — ONDANSETRON HCL 4 MG/2ML IJ SOLN
INTRAMUSCULAR | Status: DC | PRN
  Administered 2015-07-11: 4 mg via INTRAVENOUS

## 2015-07-11 MED ORDER — LACTATED RINGERS IV SOLN
INTRAVENOUS | Status: DC | PRN
Start: 2015-07-11 — End: 2015-07-11

## 2015-07-11 MED ORDER — FENTANYL CITRATE (PF) 100 MCG/2ML IJ SOLN
INTRAMUSCULAR | Status: DC | PRN
  Administered 2015-07-11 (×3): 50 ug via INTRAVENOUS
  Administered 2015-07-11: 25 ug via INTRAVENOUS
  Administered 2015-07-11: 50 ug via INTRAVENOUS

## 2015-07-11 SURGICAL SUPPLY — 44 items
CANISTER SUCTION 2500CC (MISCELLANEOUS) ×2 IMPLANT
CLIP TI MEDIUM 24 (CLIP) ×2 IMPLANT
CLIP TI WIDE RED SMALL 24 (CLIP) ×2 IMPLANT
CORDS BIPOLAR (ELECTRODE) IMPLANT
COVER PROBE W GEL 5X96 (DRAPES) ×2 IMPLANT
DECANTER SPIKE VIAL GLASS SM (MISCELLANEOUS) IMPLANT
DRSG COVADERM 4X10 (GAUZE/BANDAGES/DRESSINGS) IMPLANT
DRSG COVADERM 4X8 (GAUZE/BANDAGES/DRESSINGS) IMPLANT
ELECT REM PT RETURN 9FT ADLT (ELECTROSURGICAL) ×2
ELECTRODE REM PT RTRN 9FT ADLT (ELECTROSURGICAL) ×1 IMPLANT
EVACUATOR SILICONE 100CC (DRAIN) ×2 IMPLANT
GAUZE SPONGE 2X2 8PLY STRL LF (GAUZE/BANDAGES/DRESSINGS) ×1 IMPLANT
GLOVE BIO SURGEON STRL SZ7 (GLOVE) ×2 IMPLANT
GLOVE BIOGEL PI IND STRL 6.5 (GLOVE) ×3 IMPLANT
GLOVE BIOGEL PI IND STRL 7.0 (GLOVE) ×2 IMPLANT
GLOVE BIOGEL PI IND STRL 7.5 (GLOVE) ×2 IMPLANT
GLOVE BIOGEL PI INDICATOR 6.5 (GLOVE) ×3
GLOVE BIOGEL PI INDICATOR 7.0 (GLOVE) ×2
GLOVE BIOGEL PI INDICATOR 7.5 (GLOVE) ×2
GLOVE ECLIPSE 7.0 STRL STRAW (GLOVE) ×2 IMPLANT
GLOVE SURG SS PI 6.5 STRL IVOR (GLOVE) ×4 IMPLANT
GOWN STRL REUS W/ TWL LRG LVL3 (GOWN DISPOSABLE) ×5 IMPLANT
GOWN STRL REUS W/TWL LRG LVL3 (GOWN DISPOSABLE) ×5
HEMOSTAT SPONGE AVITENE ULTRA (HEMOSTASIS) ×2 IMPLANT
KIT BASIN OR (CUSTOM PROCEDURE TRAY) ×2 IMPLANT
KIT ROOM TURNOVER OR (KITS) ×2 IMPLANT
LIQUID BAND (GAUZE/BANDAGES/DRESSINGS) ×2 IMPLANT
NS IRRIG 1000ML POUR BTL (IV SOLUTION) ×2 IMPLANT
PACK CV ACCESS (CUSTOM PROCEDURE TRAY) ×2 IMPLANT
PAD ARMBOARD 7.5X6 YLW CONV (MISCELLANEOUS) ×4 IMPLANT
SPONGE GAUZE 2X2 STER 10/PKG (GAUZE/BANDAGES/DRESSINGS) ×1
SPONGE SURGIFOAM ABS GEL 100 (HEMOSTASIS) IMPLANT
STAPLER VISISTAT 35W (STAPLE) IMPLANT
SUT ETHILON 3 0 PS 1 (SUTURE) ×2 IMPLANT
SUT MNCRL AB 4-0 PS2 18 (SUTURE) ×2 IMPLANT
SUT PROLENE 6 0 BV (SUTURE) ×4 IMPLANT
SUT PROLENE 7 0 BV 1 (SUTURE) IMPLANT
SUT SILK 2 0 SH (SUTURE) IMPLANT
SUT VIC AB 2-0 CT1 27 (SUTURE)
SUT VIC AB 2-0 CT1 TAPERPNT 27 (SUTURE) IMPLANT
SUT VIC AB 3-0 SH 27 (SUTURE) ×2
SUT VIC AB 3-0 SH 27X BRD (SUTURE) ×2 IMPLANT
UNDERPAD 30X30 INCONTINENT (UNDERPADS AND DIAPERS) ×2 IMPLANT
WATER STERILE IRR 1000ML POUR (IV SOLUTION) ×2 IMPLANT

## 2015-07-11 NOTE — Progress Notes (Signed)
While in PACU I stopped IVF to red port.  Brisk blood return, flushed 10cc NS, Heparin (1,000 units/ml) 1.40ml instilled.   Blue dead end cap applied and clamps and caps tapped closed.

## 2015-07-11 NOTE — H&P (View-Only) (Signed)
Postoperative Access Visit   History of Present Illness  Jordan [name] is a 75 y.o. year old female who presents for postoperative follow-up for: R 1st stage BVT (Date: 04/13/15).  The patient's wounds are healed.  The patient notes steal symptoms.  The patient is able to complete their activities of daily living.  The patient's current symptoms are: none   Past Medical History  Diagnosis Date  . Breast cancer ([facility])   . Diabetes mellitus with ESRD (end-stage renal disease) ([facility])   . Hypertension   . Arthritis   . COPD (chronic obstructive pulmonary disease) ([facility])   . Renal disorder     Family reports acute renal failure  . GERD (gastroesophageal reflux disease)   . CKD (chronic kidney disease)   . Systolic CHF, chronic ([facility]) 12/23/2014    Past Surgical History  Procedure Laterality Date  . Breast surgery Left   . Cardiac catheterization [name] 10/19/2014    Procedure: Right/Left Heart Cath and Coronary Angiography;  Surgeon: Peter M Martinique, MD;  Location: [facility];  Service: Cardiovascular;  Laterality: [name];  . Av fistula placement Right 10/26/2014    Procedure: Right Brachiocephalic Arteriovenous FISTULA CREATION;  Surgeon: Conrad Liberty, MD;  Location: [facility];  Service: Vascular;  Laterality: Right;  . Tee without cardioversion [name] 12/27/2014    Procedure: TRANSESOPHAGEAL ECHOCARDIOGRAM (TEE);  Surgeon: Thayer Headings, MD;  Location: [facility];  Service: Cardiovascular;  Laterality: [name];  . Peripheral vascular catheterization [name] 01/24/2015    Procedure: Fistulagram;  Surgeon: Conrad Toppen[place], MD;  Location: [facility];  Service: Cardiovascular;  Laterality: [name];  . Fistula superficialization Right 01/26/2015    Procedure: Right BRACHIOCEPHALIC ARTERIOVENOUS FISTULA SUPERFICIALIZATION with side branch ligation;  Surgeon: Conrad Irwin, MD;  Location: [facility];  Service: Vascular;  Laterality: Right;  . Thrombectomy w/ embolectomy Right 03/07/2015    Procedure:  THROMBECTOMY OF RIGHT BRACHIOCEPHALIC ARTERIOVENOUS FISTULA;  Surgeon: Conrad Guernsey, MD;  Location: [facility];  Service: Vascular;  Laterality: Right;  . Ligation of arteriovenous  fistula Right 03/07/2015    Procedure: LIGATION OF RIGHT BRACHIOCEPHALIC ARTERIOVENOUS  FISTULA;  Surgeon: Conrad [place] Mitchell, MD;  Location: [facility];  Service: Vascular;  Laterality: Right;  . Bascilic vein transposition Right 04/13/2015    Procedure: RIGHT FIRST STAGE [place];  Surgeon: Conrad Merritt [place], MD;  Location: [facility];  Service: Vascular;  Laterality: Right;    Social History   Social History  . Marital Status: Single    Spouse Name: [name]  . Number of Children: [name]  . Years of Education: [name]   Occupational History  . Not on file.   Social History Main Topics  . Smoking status: Never Smoker   . Smokeless tobacco: Never Used  . Alcohol Use: No  . Drug Use: No  . Sexual Activity: Not on file   Other Topics Concern  . Not on file   Social History Narrative    Family History  Problem Relation Age of Onset  . Diabetes Mother   . Hypertension Mother   . Stroke Sister   . Hypertension Sister   . Heart attack Neg Hx     Current Outpatient Prescriptions  Medication Sig Dispense Refill  . aspirin 81 MG tablet Take 81 mg by mouth daily.    . calcium carbonate (TUMS EX) 750 MG chewable tablet Chew 1 tablet by mouth daily.    . carvedilol (COREG) 6.25 MG  tablet Take 1 tablet (6.25 mg total) by mouth 2 (two) times daily with a meal. 60 tablet 1  . gabapentin (NEURONTIN) 300 MG capsule Take 1 capsule (300 mg total) by mouth at bedtime. 60 capsule 6  . Insulin Detemir (LEVEMIR FLEXPEN) 100 UNIT/ML Pen Inject 55 Units into the skin every morning. 15 mL 11  . insulin lispro (HUMALOG KWIKPEN) 100 UNIT/ML KiwkPen Inject 1-5 Units into the skin 3 (three) times daily as needed (blood sugar).     . Multiple Vitamins-Minerals (CENTRUM SILVER ADULT 50+ PO) Take 1 tablet by mouth daily.    Marland Kitchen albuterol  (PROVENTIL HFA;VENTOLIN HFA) 108 (90 BASE) MCG/ACT inhaler Inhale 1-2 puffs into the lungs every 6 (six) hours as needed for wheezing or shortness of breath. Reported on 06/24/2015    . albuterol-ipratropium (COMBIVENT) 18-103 MCG/ACT inhaler Inhale 1 puff into the lungs daily as needed. Reported on 06/24/2015    . traMADol (ULTRAM) 50 MG tablet Take 1 tablet (50 mg total) by mouth every 6 (six) hours as needed. (Patient not taking: Reported on 06/24/2015) 20 tablet 1   No current facility-administered medications for this visit.     Allergies  Allergen Reactions  . Vicodin [Hydrocodone-Acetaminophen] Itching     REVIEW OF SYSTEMS:  (Positives checked otherwise negative)  CARDIOVASCULAR:   [ ]  chest pain,  [ ]  chest pressure,  [ ]  palpitations,  [ ]  shortness of breath when laying flat,  [ ]  shortness of breath with exertion,   [ ]  pain in feet when walking,  [ ]  pain in feet when laying flat, [ ]  history of blood clot in veins (DVT),  [ ]  history of phlebitis,  [ ]  swelling in legs,  [ ]  varicose veins  PULMONARY:   [ ]  productive cough,  [ ]  asthma,  [ ]  wheezing  NEUROLOGIC:   [ ]  weakness in arms or legs,  [ ]  numbness in arms or legs,  [ ]  difficulty speaking or slurred speech,  [ ]  temporary loss of vision in one eye,  [ ]  dizziness  HEMATOLOGIC:   [ ]  bleeding problems,  [ ]  problems with blood clotting too easily  MUSCULOSKEL:   [ ]  joint pain, [ ]  joint swelling  GASTROINTEST:   [ ]  vomiting blood,  [ ]  blood in stool     GENITOURINARY:   [ ]  burning with urination,  [ ]  blood in urine [x]  ESRD-HD: T/R/S  PSYCHIATRIC:   [ ]  history of major depression  INTEGUMENTARY:   [ ]  rashes,  [ ]  ulcers  CONSTITUTIONAL:   [ ]  fever,  [ ]  chills    For VQI Use Only  PRE-ADM LIVING: Home  AMB STATUS: Ambulatory   Physical Examination Filed Vitals:   06/24/15 1121  BP: 97/60  Pulse: 79  Temp: 97.2 F (36.2 C)  Resp: 16   Pulmonary: Sym exp,  good air movt, CTAB, no rales, rhonchi, & wheezing  Cardiac: RRR, Nl S1, S2, no Murmurs, rubs or gallops  RUE: Incision is healed, skin feels warm, hand grip is 5/5, sensation in digits is intact, palpable thrill, bruit can be auscultated, fistula appears to 5-6 mm in diameter, extremely deep (>2 cm) starting mid-arm  Medical Decision Making  Jordan [name] is a 75 y.o. year old female who presents s/p R 1st stage BVT.   The patient's access is transposition, i.e. R 2nd stage BVT.  The patient is scheduled for 27 MAR 16. Risk, benefits,  and alternatives to access surgery were discussed.   The patient is aware the risks include but are not limited to: bleeding, infection, steal syndrome, nerve damage, ischemic monomelic neuropathy, failure to mature, need for additional procedures, death and stroke.   The patient agrees to proceed forward with the procedure.   Thank you for allowing [place] to participate in this patient's care.  Adele Barthel, MD [facility] Office: [phone] Pager: [phone]  06/24/2015, 12:16 PM

## 2015-07-11 NOTE — Interval H&P Note (Signed)
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 After consideration of risks, benefits and other options for treatment, the patient has consented to  Procedure(s): SECOND STAGE BASILIC VEIN TRANSPOSITION (Right) as a surgical intervention .  The patient's history has been reviewed, patient examined, no change in status, stable for surgery.  I have reviewed the patient's chart and labs.  Questions were answered to the patient's satisfaction.     Adele Barthel

## 2015-07-11 NOTE — Anesthesia Procedure Notes (Signed)
Procedure Name: LMA Insertion Date/Time: 07/11/2015 7:39 AM Performed by: Mariea Clonts Pre-anesthesia Checklist: Patient identified, Emergency Drugs available, Suction available, Patient being monitored and Timeout performed Patient Re-evaluated:Patient Re-evaluated prior to inductionOxygen Delivery Method: Circle system utilized Preoxygenation: Pre-oxygenation with 100% oxygen Intubation Type: IV induction Ventilation: Mask ventilation without difficulty LMA: LMA inserted LMA Size: 4.0 Number of attempts: 1 Placement Confirmation: positive ETCO2,  CO2 detector and breath sounds checked- equal and bilateral Tube secured with: Tape Dental Injury: Teeth and Oropharynx as per pre-operative assessment

## 2015-07-11 NOTE — Anesthesia Postprocedure Evaluation (Signed)
Anesthesia Post Note  Patient: Jordan [name]  Procedure(s) Performed: Procedure(s) (LRB): SECOND STAGE BASILIC VEIN TRANSPOSITION (Right)  Patient location during evaluation: PACU Anesthesia Type: General Level of consciousness: awake and alert Pain management: pain level controlled Vital Signs Assessment: post-procedure vital signs reviewed and stable Respiratory status: spontaneous breathing, nonlabored ventilation, respiratory function stable and patient connected to nasal cannula oxygen Cardiovascular status: blood pressure returned to baseline and stable Postop Assessment: no signs of nausea or vomiting Anesthetic complications: no    Last Vitals:  Filed Vitals:   07/11/15 1045 07/11/15 1100  BP:  134/56  Pulse: 69 69  Temp:    Resp: 19 12    Last Pain:  Filed Vitals:   07/11/15 1102  PainSc: 7                  Kristena Wilhelmi S

## 2015-07-11 NOTE — Op Note (Signed)
OPERATIVE NOTE   PROCEDURE: right second stage basilic vein transposition (brachiobasilic arteriovenous fistula) placement with revision of anastomosis  PRE-OPERATIVE DIAGNOSIS: end stage renal disease   POST-OPERATIVE DIAGNOSIS: same as above   SURGEON: Adele Barthel, MD  ASSISTANT(S): Gerri Lins, PAC   ANESTHESIA: general  ESTIMATED BLOOD LOSS: 50 cc  FINDING(S): 1.  No thrill in fistula intraoperatively 2.  Sclerotic cubital vein 3.  Palpable thrill in fistula after revision with new anastomosis more proximally 4.  Disease brachial artery 5.  Faintly dopplerable radial signal  SPECIMEN(S):  none  INDICATIONS:   Jordan [name] is a 75 y.o. female who presents with end stage renal disease and successful right first stage basilic vein transposition.  The patient is scheduled for right second stage basilic vein transposition.  The patient is aware the risks include but are not limited to: bleeding, infection, steal syndrome, nerve damage, ischemic monomelic neuropathy, failure to mature, and need for additional procedures.  The patient is aware of the risks of the procedure and elects to proceed forward.  DESCRIPTION: After full informed written consent was obtained from the patient, the patient was brought back to the operating room and placed supine upon the operating table.  Prior to induction, the patient received IV antibiotics.   After obtaining adequate anesthesia, the patient was then prepped and draped in the standard fashion for a right arm access procedure.  I turned my attention first to identifying the patient's brachiobasilic arteriovenous fistula.  Using SonoSite guidance, the location of this fistula was marked out on the skin.   I made an longitudinal incision over the fistula from its arterial anastomosis up to its axillary extent.  I carefully dissected the fistula away from its adjacent nerves.  Eventually the entirety of this fistula was mobilized.   There was no thrill in this fistula and only a doppler signal.  I couldn't even feel a pulse with compression of the proximal vein.  The distal cubital segment was noted to be small and externally diseased, suggesting there was compromised lumen here.  I felt the only way to salvage this fistula was to cut off the diseased distal 3-5 cm.  I dissected out the brachial artery ~3-5 cm proximal to the prior anastomosis.  I marked the anterior surface of the basilic vein.  I clamped the distal fistula and transected the sclerotic segment.  I tied a 2-0 silk around the distal fistula.  The residual vein was interrogated and noted to have a 5-6 mm lumen.  I tunneled with a curved tunneler from the adjacent to the new anastomosis site to the axilla, taking care to keep the tunneler superficial.  I tied the basilic vein to the inner cannula and delivered the vein through the tunnel, maintaining the orientation.  I placed the brachial artery under tension proximally and distally.  I made an arteriotomy and extended it proximally and distally.  I sewed the vein to the brachial artery with a running stitch of 6-0 Prolene in an end-to-side configuration.  I backbled all vessels prior to completing this anastomosis.  No thrombus was noted.  I completed this anastomosis in the usual fashion.  Immediately upon releasing the vessel loops, there was a strong thrill in this fistula.  Distally there was a faintly doppler signal in the radial artery which augmented with compression of the venous outflow.  There was some tethering of the fistula due to some fascia posterior to the fistula, so I had to  lyse some of this with electrocautery to prevent compression.  The deep subcutaneous tissue was inspected for bleeding.  Bleeding was controlled with electrocautery and placement of large pieces of Avitene.  Due to the depth of fat, >5 cm, I felt placement of a drain was indicated.  I placed a 15 Blake drain through the skin distally.   I secured the drain with 3 -0 Nylon tied to the drain.  I sharply shorted the drain and placed it adjacent to the vein harvest site.  I washed out the surgical site after waiting a few minutes, and there was no further bleeding.  The fascia was reapproximated with running stitch of 2-0 Vicryl.  The subcutaneous tissue was reapproximated with a double layer of 3-0 Vicryl.  The skin was then reapproximated with a running subcuticular of 4-0 Monocryl.  The skin was then cleaned, dried, and reinforced with Dermabond.  The patient tolerated this procedure well.    COMPLICATIONS: none  CONDITION: stable   Adele Barthel, MD [facility] Office: [phone] Pager: [phone]  07/11/2015, 10:10 AM

## 2015-07-11 NOTE — Anesthesia Preprocedure Evaluation (Signed)
Anesthesia Evaluation  Patient identified by MRN, date of birth, ID band Patient awake    Reviewed: Allergy & Precautions, NPO status , Patient's Chart, lab work & pertinent test results  Airway Mallampati: II  TM Distance: >3 FB Neck ROM: Full    Dental no notable dental hx.    Pulmonary sleep apnea ,    Pulmonary exam normal breath sounds clear to auscultation       Cardiovascular hypertension, + Past MI and +CHF  negative cardio ROS Normal cardiovascular exam Rhythm:Regular Rate:Normal     Neuro/Psych negative neurological ROS  negative psych ROS   GI/Hepatic negative GI ROS, Neg liver ROS,   Endo/Other  diabetesMorbid obesity  Renal/GU ESRFRenal disease  negative genitourinary   Musculoskeletal negative musculoskeletal ROS (+)   Abdominal   Peds negative pediatric ROS (+)  Hematology negative hematology ROS (+)   Anesthesia Other Findings   Reproductive/Obstetrics negative OB ROS                             Anesthesia Physical Anesthesia Plan  ASA: III  Anesthesia Plan: General   Post-op Pain Management:    Induction: Intravenous  Airway Management Planned: Oral ETT and LMA  Additional Equipment:   Intra-op Plan:   Post-operative Plan: Extubation in OR  Informed Consent: I have reviewed the patients History and Physical, chart, labs and discussed the procedure including the risks, benefits and alternatives for the proposed anesthesia with the patient or authorized representative who has indicated his/her understanding and acceptance.   Dental advisory given  Plan Discussed with: CRNA and Surgeon  Anesthesia Plan Comments:         Anesthesia Quick Evaluation

## 2015-07-11 NOTE — Transfer of Care (Signed)
Immediate Anesthesia Transfer of Care Note  Patient: Jordan [name]  Procedure(s) Performed: Procedure(s): SECOND STAGE BASILIC VEIN TRANSPOSITION (Right)  Patient Location: PACU  Anesthesia Type:General  Level of Consciousness: awake, alert , oriented, patient cooperative and responds to stimulation  Airway & Oxygen Therapy: Patient Spontanous Breathing and Patient connected to face mask oxygen  Post-op Assessment: Report given to RN, Post -op Vital signs reviewed and stable and Patient moving all extremities X 4  Post vital signs: Reviewed and stable  Last Vitals:  Filed Vitals:   07/11/15 0633  BP: 145/44  Pulse: 70  Temp: 36.8 C  Resp: 16    Complications: No apparent anesthesia complications

## 2015-07-12 ENCOUNTER — Telehealth: Payer: Self-pay | Admitting: Vascular Surgery

## 2015-07-12 ENCOUNTER — Encounter (HOSPITAL_COMMUNITY): Payer: Self-pay | Admitting: Vascular Surgery

## 2015-07-12 NOTE — Telephone Encounter (Signed)
-----   Message from Mena Goes, RN sent at 07/11/2015 11:43 AM EDT ----- Regarding: schedule   ----- Message -----    From: Conrad [place], MD    Sent: 07/11/2015  10:22 AM      To: [facility] [address]  [name] [name] [id number] 1940/10/26  Procedure:  right second stage basilic vein transposition (brachiobasilic arteriovenous fistula) placement with revision of anastomosis  Asst: Gerri Lins, [facility]   Follow-up: this Friday for drain removal, follow in 4 weeks for post-op visit

## 2015-07-12 NOTE — Telephone Encounter (Signed)
Spoke with pt to schedule, dpm °

## 2015-07-14 ENCOUNTER — Encounter: Payer: Self-pay | Admitting: Vascular Surgery

## 2015-07-15 ENCOUNTER — Ambulatory Visit (INDEPENDENT_AMBULATORY_CARE_PROVIDER_SITE_OTHER): Payer: Medicare (Managed Care) | Admitting: Vascular Surgery

## 2015-07-15 ENCOUNTER — Encounter: Payer: Self-pay | Admitting: Vascular Surgery

## 2015-07-15 VITALS — BP 145/68 | HR 77 | Temp 100.9°F | Ht 65.0 in | Wt 279.4 lb

## 2015-07-15 DIAGNOSIS — Z992 Dependence on renal dialysis: Secondary | ICD-10-CM

## 2015-07-15 DIAGNOSIS — N186 End stage renal disease: Secondary | ICD-10-CM

## 2015-07-15 MED ORDER — OXYCODONE HCL 5 MG PO TABS: 5.0000 mg | ORAL_TABLET | Freq: Three times a day (TID) | ORAL | Status: DC | PRN

## 2015-07-15 NOTE — Progress Notes (Signed)
    Postoperative Access Visit   History of Present Illness  Jordan [name] is a 75 y.o. year old female who presents for JP removal.  Pt c/o pain in surgical site.  Pt given Ultram due to Hydrocodone allergy   Physical Examination Filed Vitals:   07/15/15 1039 07/15/15 1041  BP: 145/65 145/68  Pulse: 77   Temp: 100.9 F (38.3 C)     RUE: Incision is c/d/i, skin feels warm, hand grip is 5/5, sensation in digits is intact, palpable thrill, bruit can be auscultated , JP with ~30 cc serosang dranage/day  Medical Decision Making  Jordan [name] is a 75 y.o. year old female who presents s/p L 2nd BRVT.   JP removed  Follow up in 4 weeks  Thank you for allowing [place] to participate in this patient's care.  Adele Barthel, MD [facility] [place] Office: [phone] Pager: [phone]  07/15/2015, 11:08 AM

## 2015-08-09 ENCOUNTER — Encounter: Payer: Self-pay | Admitting: Vascular Surgery

## 2015-08-12 ENCOUNTER — Ambulatory Visit (INDEPENDENT_AMBULATORY_CARE_PROVIDER_SITE_OTHER): Payer: Medicare (Managed Care) | Admitting: Vascular Surgery

## 2015-08-12 ENCOUNTER — Encounter: Payer: Self-pay | Admitting: Vascular Surgery

## 2015-08-12 VITALS — BP 120/61 | HR 87 | Temp 97.6°F | Resp 14 | Ht 65.0 in | Wt 274.0 lb

## 2015-08-12 DIAGNOSIS — N186 End stage renal disease: Secondary | ICD-10-CM

## 2015-08-12 DIAGNOSIS — Z992 Dependence on renal dialysis: Secondary | ICD-10-CM

## 2015-08-12 NOTE — Progress Notes (Signed)
    Postoperative Access Visit   History of Present Illness  Aleaha Varma Reed-Grier is a 75 y.o. year old female who presents for postoperative follow-up for:  R 2nd stage BVT with anastomosis revision (Date: 07/11/15).  The patient's wounds are nearly healed.  The patient notes no steal symptoms.  The patient is able to complete their activities of daily living.  The patient's current symptoms are: none.  For VQI Use Only  PRE-ADM LIVING: Home  AMB STATUS: Ambulatory  Physical Examination Filed Vitals:   08/12/15 0915  BP: 120/61  Pulse: 87  Temp: 97.6 F (36.4 C)  Resp: 14    RUE: Incision is healed, skin feels warm, hand grip is 5/5, sensation in digits is intact, palpable thrill, bruit can be auscultated , On Sonosite: fistula > 6 mm throughout with shallow position for 3/4 of the length, two shallow scabs with spit out suture  Medical Decision Making  Ariane Howington Reed-Grier is a 75 y.o. year old female who presents s/p R 2nd stage BVT with anastomosis revision.   Pt was given wound care instructions.  Suspect she will be healed in the next 2-4 weeks.  Recheck wound in 2 weeks.  Thank you for allowing [place] to participate in this patient's care.  Adele Barthel, MD [facility] [place] Office: [phone] Pager: [phone]  08/12/2015, 8:19 AM

## 2015-08-19 ENCOUNTER — Encounter: Payer: Self-pay | Admitting: Vascular Surgery

## 2015-08-25 NOTE — Progress Notes (Signed)
Postoperative Access Visit   History of Present Illness  Jordan [name] is a 75 y.o. ([birth year]) female who presents for postoperative follow-up for: R 2nd stage BVT with anastomosis revision (Date: 07/11/15). The patient's wounds are nearly healed. The patient notes no steal symptoms. The patient is able to complete their activities of daily living. The patient's current symptoms are: none.  Past Medical History  Diagnosis Date  . Breast cancer ([place])   . Diabetes mellitus with ESRD (end-stage renal disease) ([place])   . Hypertension   . Arthritis   . COPD (chronic obstructive pulmonary disease) ([place])   . Renal disorder     Family reports acute renal failure  . GERD (gastroesophageal reflux disease)   . CKD (chronic kidney disease)   . Systolic CHF, chronic ([place]) 12/23/2014  . Sleep apnea     does not wear CPAP    Past Surgical History  Procedure Laterality Date  . Breast surgery Left   . Cardiac catheterization [name] 10/19/2014    Procedure: Right/Left Heart Cath and Coronary Angiography;  Surgeon: Peter M Martinique, MD;  Location: [facility];  Service: Cardiovascular;  Laterality: [name];  . Av fistula placement Right 10/26/2014    Procedure: Right Brachiocephalic Arteriovenous FISTULA CREATION;  Surgeon: Conrad Newcastle, MD;  Location: [facility];  Service: Vascular;  Laterality: Right;  . Tee without cardioversion [name] 12/27/2014    Procedure: TRANSESOPHAGEAL ECHOCARDIOGRAM (TEE);  Surgeon: Thayer Headings, MD;  Location: [facility];  Service: Cardiovascular;  Laterality: [name];  . Peripheral vascular catheterization [name] 01/24/2015    Procedure: Fistulagram;  Surgeon: Conrad Pembroke, MD;  Location: [facility];  Service: Cardiovascular;  Laterality: [name];  . Fistula superficialization Right 01/26/2015    Procedure: Right BRACHIOCEPHALIC ARTERIOVENOUS FISTULA SUPERFICIALIZATION with side branch ligation;  Surgeon: Conrad Northport, MD;  Location: [facility];  Service: Vascular;   Laterality: Right;  . Thrombectomy w/ embolectomy Right 03/07/2015    Procedure: THROMBECTOMY OF RIGHT BRACHIOCEPHALIC ARTERIOVENOUS FISTULA;  Surgeon: Conrad Brook[place] Park, MD;  Location: [facility];  Service: Vascular;  Laterality: Right;  . Ligation of arteriovenous  fistula Right 03/07/2015    Procedure: LIGATION OF RIGHT BRACHIOCEPHALIC ARTERIOVENOUS  FISTULA;  Surgeon: Conrad Redby, MD;  Location: [facility];  Service: Vascular;  Laterality: Right;  . Bascilic vein transposition Right 04/13/2015    Procedure: RIGHT FIRST STAGE [place];  Surgeon: Conrad Hallstead, MD;  Location: [facility];  Service: Vascular;  Laterality: Right;  . Bascilic vein transposition Right 07/11/2015    Procedure: SECOND STAGE BASILIC VEIN TRANSPOSITION;  Surgeon: Conrad , MD;  Location: [facility];  Service: Vascular;  Laterality: Right;    Social History   Social History  . Marital Status: Single    Spouse Name: [name]  . Number of Children: [name]  . Years of Education: [name]   Occupational History  . Not on file.   Social History Main Topics  . Smoking status: Never Smoker   . Smokeless tobacco: Never Used  . Alcohol Use: No  . Drug Use: No  . Sexual Activity: Not on file   Other Topics Concern  . Not on file   Social History Narrative    Family History  Problem Relation Age of Onset  . Diabetes Mother   . Hypertension Mother   . Stroke Sister   . Hypertension Sister   . Heart attack Neg Hx     Current Outpatient Prescriptions  Medication Sig Dispense Refill  . albuterol (PROVENTIL HFA;VENTOLIN HFA) 108 (90 BASE) MCG/ACT inhaler Inhale 1-2 puffs into the lungs every 6 (six) hours as needed for wheezing or shortness of breath. Reported on 07/07/2015    . albuterol-ipratropium (COMBIVENT) 18-103 MCG/ACT inhaler Inhale 1 puff into the lungs daily as needed for wheezing or shortness of breath. Reported on 06/24/2015    . aspirin 81 MG tablet Take 81 mg by mouth daily.    . calcium carbonate (TUMS EX) 750  MG chewable tablet Chew 750 mg by mouth 3 (three) times daily.     . carvedilol (COREG) 6.25 MG tablet Take 1 tablet (6.25 mg total) by mouth 2 (two) times daily with a meal. (Patient taking differently: Take 6.25 mg by mouth every Monday,Wednesday,Friday, and Sunday at 6 PM. ) 60 tablet 1  . gabapentin (NEURONTIN) 300 MG capsule Take 1 capsule (300 mg total) by mouth at bedtime. 60 capsule 6  . Insulin Detemir (LEVEMIR FLEXPEN) 100 UNIT/ML Pen Inject 55 Units into the skin every morning. (Patient taking differently: Inject 55 Units into the skin every Monday,Wednesday,Friday, and Sunday at 6 PM. ) 15 mL 11  . insulin lispro (HUMALOG KWIKPEN) 100 UNIT/ML KiwkPen Inject 1-5 Units into the skin 3 (three) times daily as needed (if blood sugar is >150).     . Multiple Vitamins-Minerals (CENTRUM SILVER ADULT 50+ PO) Take 1 tablet by mouth every Monday,Wednesday,Friday, and Sunday at 6 PM.     . OVER THE COUNTER MEDICATION Place 1 spray into both nostrils daily as needed (for allergies).    . traMADol (ULTRAM) 50 MG tablet Take 1 tablet (50 mg total) by mouth every 6 (six) hours as needed. 30 tablet 0  . oxyCODONE (OXY IR/ROXICODONE) 5 MG immediate release tablet Take 1 tablet (5 mg total) by mouth every 8 (eight) hours as needed for moderate pain. (Patient not taking: Reported on 08/12/2015) 30 tablet 0   No current facility-administered medications for this visit.     Allergies  Allergen Reactions  . Vicodin [Hydrocodone-Acetaminophen] Itching  . Adhesive [Tape] Itching and Rash    Please use "paper" tape     REVIEW OF SYSTEMS:  (Positives checked otherwise negative)  CARDIOVASCULAR:   [ ]  chest pain,  [ ]  chest pressure,  [ ]  palpitations,  [ ]  shortness of breath when laying flat,  [ ]  shortness of breath with exertion,   [ ]  pain in feet when walking,  [ ]  pain in feet when laying flat, [ ]  history of blood clot in veins (DVT),  [ ]  history of phlebitis,  [ ]  swelling in legs,  [ ]   varicose veins  PULMONARY:   [ ]  productive cough,  [ ]  asthma,  [ ]  wheezing  NEUROLOGIC:   [ ]  weakness in arms or legs,  [ ]  numbness in arms or legs,  [ ]  difficulty speaking or slurred speech,  [ ]  temporary loss of vision in one eye,  [ ]  dizziness  HEMATOLOGIC:   [ ]  bleeding problems,  [ ]  problems with blood clotting too easily  MUSCULOSKEL:   [ ]  joint pain, [ ]  joint swelling  GASTROINTEST:   [ ]  vomiting blood,  [ ]  blood in stool     GENITOURINARY:   [ ]  burning with urination,  [ ]  blood in urine [x]  ESRD-HD: T/R/S  PSYCHIATRIC:   [ ]  history of major depression  INTEGUMENTARY:   [ ]  rashes,  [ ]   ulcers  CONSTITUTIONAL:   [ ]  fever,  [ ]  chills    For VQI Use Only  PRE-ADM LIVING: Home  AMB STATUS: Ambulatory  Physical Examination  Filed Vitals:   08/26/15 1119  BP: 125/70  Pulse: 87  Height: 5\' 5"  (1.651 m)  Weight: 275 lb (124.739 kg)  SpO2: 95%   Pulmonary: Sym exp, good air movt, CTAB, no rales, rhonchi, & wheezing  Cardiac: RRR, Nl S1, S2, no Murmurs, rubs or gallops  RUE: Incision is healed, skin feels warm, hand grip is 5/5, sensation in digits is intact, thrill markedly weaker than previous, bruit can be auscultated with wheezing sound, incision nearly healed  Medical Decision Making  Jordan [name] is a 75 y.o. (September 17, 1940) female who presents s/p R 2nd stage BVT with anastomosis revision possible hemodynamically significant stenosis   I recommend R arm fistulogram, possible intervention.  She is scheduled for this coming Monday 15 MAY 17.  I discussed with the patient the nature of angiographic procedures, especially the limited patencies of any endovascular intervention.    The patient is aware of that the risks of an angiographic procedure include but are not limited to: bleeding, infection, access site complications, renal failure, embolization, rupture of vessel, dissection, arteriovenous fistula,  possible need for emergent surgical intervention, possible need for surgical procedures to treat the patient's pathology, anaphylactic reaction to contrast, and stroke and death.    The patient is aware of the risks and agrees to proceed.  Thank you for allowing [place] to participate in this patient's care.  Adele Barthel, MD, FACS [facility] [place] Office: [phone] Pager: [phone]  08/25/2015, 9:51 AM

## 2015-08-26 ENCOUNTER — Encounter: Payer: Self-pay | Admitting: Vascular Surgery

## 2015-08-26 ENCOUNTER — Other Ambulatory Visit: Payer: Self-pay

## 2015-08-26 ENCOUNTER — Ambulatory Visit (INDEPENDENT_AMBULATORY_CARE_PROVIDER_SITE_OTHER): Payer: Medicare (Managed Care) | Admitting: Vascular Surgery

## 2015-08-26 VITALS — BP 125/70 | HR 87 | Ht 65.0 in | Wt 275.0 lb

## 2015-08-26 DIAGNOSIS — N186 End stage renal disease: Secondary | ICD-10-CM

## 2015-08-26 DIAGNOSIS — Z992 Dependence on renal dialysis: Secondary | ICD-10-CM

## 2015-08-29 ENCOUNTER — Other Ambulatory Visit: Payer: Self-pay | Admitting: *Deleted

## 2015-08-29 ENCOUNTER — Telehealth: Payer: Self-pay | Admitting: Vascular Surgery

## 2015-08-29 ENCOUNTER — Encounter (HOSPITAL_COMMUNITY)
Admission: RE | Disposition: A | Discharge status: Home / Self Care | Payer: Self-pay | Source: Ambulatory Visit | Attending: Vascular Surgery

## 2015-08-29 ENCOUNTER — Ambulatory Visit (HOSPITAL_COMMUNITY)
Admission: RE | Admit: 2015-08-29 | Discharge: 2015-08-29 | Disposition: A | Discharge status: Home / Self Care | Payer: Medicare (Managed Care) | Source: Ambulatory Visit | Attending: Vascular Surgery | Admitting: Vascular Surgery

## 2015-08-29 DIAGNOSIS — J449 Chronic obstructive pulmonary disease, unspecified: Secondary | ICD-10-CM | POA: Diagnosis not present

## 2015-08-29 DIAGNOSIS — Z823 Family history of stroke: Secondary | ICD-10-CM | POA: Insufficient documentation

## 2015-08-29 DIAGNOSIS — E1122 Type 2 diabetes mellitus with diabetic chronic kidney disease: Secondary | ICD-10-CM | POA: Insufficient documentation

## 2015-08-29 DIAGNOSIS — Y832 Surgical operation with anastomosis, bypass or graft as the cause of abnormal reaction of the patient, or of later complication, without mention of misadventure at the time of the procedure: Secondary | ICD-10-CM | POA: Insufficient documentation

## 2015-08-29 DIAGNOSIS — Z853 Personal history of malignant neoplasm of breast: Secondary | ICD-10-CM | POA: Insufficient documentation

## 2015-08-29 DIAGNOSIS — K219 Gastro-esophageal reflux disease without esophagitis: Secondary | ICD-10-CM | POA: Diagnosis not present

## 2015-08-29 DIAGNOSIS — I5022 Chronic systolic (congestive) heart failure: Secondary | ICD-10-CM | POA: Diagnosis not present

## 2015-08-29 DIAGNOSIS — Z992 Dependence on renal dialysis: Secondary | ICD-10-CM | POA: Insufficient documentation

## 2015-08-29 DIAGNOSIS — Z7982 Long term (current) use of aspirin: Secondary | ICD-10-CM | POA: Diagnosis not present

## 2015-08-29 DIAGNOSIS — T82898A Other specified complication of vascular prosthetic devices, implants and grafts, initial encounter: Secondary | ICD-10-CM | POA: Diagnosis not present

## 2015-08-29 DIAGNOSIS — Z794 Long term (current) use of insulin: Secondary | ICD-10-CM | POA: Diagnosis not present

## 2015-08-29 DIAGNOSIS — Z4931 Encounter for adequacy testing for hemodialysis: Secondary | ICD-10-CM

## 2015-08-29 DIAGNOSIS — M1990 Unspecified osteoarthritis, unspecified site: Secondary | ICD-10-CM | POA: Diagnosis not present

## 2015-08-29 DIAGNOSIS — I132 Hypertensive heart and chronic kidney disease with heart failure and with stage 5 chronic kidney disease, or end stage renal disease: Secondary | ICD-10-CM | POA: Insufficient documentation

## 2015-08-29 DIAGNOSIS — N186 End stage renal disease: Secondary | ICD-10-CM | POA: Diagnosis not present

## 2015-08-29 DIAGNOSIS — Z8249 Family history of ischemic heart disease and other diseases of the circulatory system: Secondary | ICD-10-CM | POA: Diagnosis not present

## 2015-08-29 DIAGNOSIS — T82858A Stenosis of vascular prosthetic devices, implants and grafts, initial encounter: Secondary | ICD-10-CM | POA: Insufficient documentation

## 2015-08-29 DIAGNOSIS — G4730 Sleep apnea, unspecified: Secondary | ICD-10-CM | POA: Diagnosis not present

## 2015-08-29 HISTORY — PX: PERIPHERAL VASCULAR CATHETERIZATION: SHX172C

## 2015-08-29 LAB — POCT I-STAT, CHEM 8
BUN: 45 mg/dL — AB (ref 6–20)
CALCIUM ION: 1.05 mmol/L — AB (ref 1.13–1.30)
CHLORIDE: 97 mmol/L — AB (ref 101–111)
Creatinine, Ser: 8.6 mg/dL — ABNORMAL HIGH (ref 0.44–1.00)
GLUCOSE: 122 mg/dL — AB (ref 65–99)
HCT: 35 % — ABNORMAL LOW (ref 36.0–46.0)
Hemoglobin: 11.9 g/dL — ABNORMAL LOW (ref 12.0–15.0)
Potassium: 4.4 mmol/L (ref 3.5–5.1)
Sodium: 139 mmol/L (ref 135–145)
TCO2: 31 mmol/L (ref 0–100)

## 2015-08-29 LAB — GLUCOSE, CAPILLARY
Glucose-Capillary: 122 mg/dL — ABNORMAL HIGH (ref 65–99)
Glucose-Capillary: 114 mg/dL — ABNORMAL HIGH (ref 65–99)

## 2015-08-29 SURGERY — A/V SHUNTOGRAM/FISTULAGRAM
Laterality: Right

## 2015-08-29 MED ORDER — HEPARIN SODIUM (PORCINE) 1000 UNIT/ML IJ SOLN
INTRAMUSCULAR | Status: DC | PRN
  Administered 2015-08-29: 3000 [IU] via INTRAVENOUS

## 2015-08-29 MED ORDER — HEPARIN (PORCINE) IN NACL 2-0.9 UNIT/ML-% IJ SOLN
INTRAMUSCULAR | Status: AC
  Filled 2015-08-29: qty 500

## 2015-08-29 MED ORDER — HEPARIN (PORCINE) IN NACL 2-0.9 UNIT/ML-% IJ SOLN
INTRAMUSCULAR | Status: DC | PRN
  Administered 2015-08-29: 500 mL

## 2015-08-29 MED ORDER — LIDOCAINE HCL (PF) 1 % IJ SOLN
INTRAMUSCULAR | Status: DC | PRN
  Administered 2015-08-29: 2 mL via SUBCUTANEOUS

## 2015-08-29 MED ORDER — SODIUM CHLORIDE 0.9% FLUSH: 3.0000 mL | INTRAVENOUS | Status: DC | PRN

## 2015-08-29 MED ORDER — HEPARIN SODIUM (PORCINE) 1000 UNIT/ML IJ SOLN
INTRAMUSCULAR | Status: AC
  Filled 2015-08-29: qty 1

## 2015-08-29 MED ORDER — ACETAMINOPHEN 325 MG PO TABS: 650.0000 mg | ORAL_TABLET | ORAL | Status: DC | PRN

## 2015-08-29 MED ORDER — LIDOCAINE HCL (PF) 1 % IJ SOLN
INTRAMUSCULAR | Status: AC
  Filled 2015-08-29: qty 30

## 2015-08-29 MED ORDER — SODIUM CHLORIDE 0.9 % IV SOLN: INTRAVENOUS | Status: DC

## 2015-08-29 MED ORDER — IODIXANOL 320 MG/ML IV SOLN
INTRAVENOUS | Status: DC | PRN
  Administered 2015-08-29: 50 mL via INTRAVENOUS

## 2015-08-29 SURGICAL SUPPLY — 18 items
BAG SNAP BAND KOVER 36X36 (MISCELLANEOUS) ×3 IMPLANT
BALLN ARMADA 6X60X80 (BALLOONS) ×3
BALLN MUSTANG 8.0X40 75 (BALLOONS) ×3
BALLOON ARMADA 6X60X80 (BALLOONS) ×2 IMPLANT
BALLOON MUSTANG 8.0X40 75 (BALLOONS) ×2 IMPLANT
COVER DOME SNAP 22 D (MISCELLANEOUS) ×3 IMPLANT
COVER PRB 48X5XTLSCP FOLD TPE (BAG) ×2 IMPLANT
COVER PROBE 5X48 (BAG) ×1
KIT ENCORE 26 ADVANTAGE (KITS) ×3 IMPLANT
KIT MICROINTRODUCER STIFF 5F (SHEATH) ×3 IMPLANT
PROTECTION STATION PRESSURIZED (MISCELLANEOUS) ×3
SHEATH PINNACLE R/O II 6F 4CM (SHEATH) ×3 IMPLANT
STATION PROTECTION PRESSURIZED (MISCELLANEOUS) ×2 IMPLANT
STOPCOCK MORSE 400PSI 3WAY (MISCELLANEOUS) ×3 IMPLANT
TRAY PV CATH (CUSTOM PROCEDURE TRAY) ×3 IMPLANT
TUBING CIL FLEX 10 FLL-RA (TUBING) ×3 IMPLANT
WIRE BENTSON .035X145CM (WIRE) ×3 IMPLANT
WIRE TORQFLEX AUST .018X40CM (WIRE) ×3 IMPLANT

## 2015-08-29 NOTE — H&P (View-Only) (Signed)
Postoperative Access Visit   History of Present Illness  Jordan [name] is a 75 y.o. 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 The patient's wounds are nearly healed. The patient notes no steal symptoms. The patient is able to complete their activities of daily living. The patient's current symptoms are: none.  Past Medical History  Diagnosis Date  . Breast cancer ([place])   . Diabetes mellitus with ESRD (end-stage renal disease) ([place])   . Hypertension   . Arthritis   . COPD (chronic obstructive pulmonary disease) ([place])   . Renal disorder     Family reports acute renal failure  . GERD (gastroesophageal reflux disease)   . CKD (chronic kidney disease)   . Systolic CHF, chronic ([place]) 12/23/2014  . Sleep apnea     does not wear CPAP    Past Surgical History  Procedure Laterality Date  . Breast surgery Left   . Cardiac catheterization [name] 10/19/2014    Procedure: Right/Left Heart Cath and Coronary Angiography;  Surgeon: Peter M Martinique, MD;  Location: [facility];  Service: Cardiovascular;  Laterality: [name];  . Av fistula placement Right 10/26/2014    Procedure: Right Brachiocephalic Arteriovenous FISTULA CREATION;  Surgeon: Conrad Forbes, MD;  Location: [facility];  Service: Vascular;  Laterality: Right;  . Tee without cardioversion [name] 12/27/2014    Procedure: TRANSESOPHAGEAL ECHOCARDIOGRAM (TEE);  Surgeon: Thayer Headings, MD;  Location: [facility];  Service: Cardiovascular;  Laterality: [name];  . Peripheral vascular catheterization [name] 01/24/2015    Procedure: Fistulagram;  Surgeon: Conrad [place], MD;  Location: [facility];  Service: Cardiovascular;  Laterality: [name];  . Fistula superficialization Right 01/26/2015    Procedure: Right BRACHIOCEPHALIC ARTERIOVENOUS FISTULA SUPERFICIALIZATION with side branch ligation;  Surgeon: Conrad [place], MD;  Location: [facility];  Service: Vascular;   Laterality: Right;  . Thrombectomy w/ embolectomy Right 03/07/2015    Procedure: THROMBECTOMY OF RIGHT BRACHIOCEPHALIC ARTERIOVENOUS FISTULA;  Surgeon: Conrad Bill[place], MD;  Location: [facility];  Service: Vascular;  Laterality: Right;  . Ligation of arteriovenous  fistula Right 03/07/2015    Procedure: LIGATION OF RIGHT BRACHIOCEPHALIC ARTERIOVENOUS  FISTULA;  Surgeon: Conrad [place], MD;  Location: [facility];  Service: Vascular;  Laterality: Right;  . Bascilic vein transposition Right 04/13/2015    Procedure: RIGHT FIRST STAGE [place];  Surgeon: Conrad Estell[place], MD;  Location: [facility];  Service: Vascular;  Laterality: Right;  . Bascilic vein transposition Right 07/11/2015    Procedure: SECOND STAGE BASILIC VEIN TRANSPOSITION;  Surgeon: Conrad [place], MD;  Location: [facility];  Service: Vascular;  Laterality: Right;    Social History   Social History  . Marital Status: Single    Spouse Name: [name]  . Number of Children: [name]  . Years of Education: [name]   Occupational History  . Not on file.   Social History Main Topics  . Smoking status: Never Smoker   . Smokeless tobacco: Never Used  . Alcohol Use: No  . Drug Use: No  . Sexual Activity: Not on file   Other Topics Concern  . Not on file   Social History Narrative    Family History  Problem Relation Age of Onset  . Diabetes Mother   . Hypertension Mother   . Stroke Sister   . Hypertension Sister   . Heart attack Neg Hx     Current Outpatient Prescriptions  Medication Sig Dispense Refill  . albuterol (PROVENTIL HFA;VENTOLIN HFA) 108 (90 BASE) MCG/ACT inhaler Inhale 1-2 puffs into the lungs every 6 (six) hours as needed for wheezing or shortness of breath. Reported on 07/07/2015    . albuterol-ipratropium (COMBIVENT) 18-103 MCG/ACT inhaler Inhale 1 puff into the lungs daily as needed for wheezing or shortness of breath. Reported on 06/24/2015    . aspirin 81 MG tablet Take 81 mg by mouth daily.    . calcium carbonate (TUMS EX) 750  MG chewable tablet Chew 750 mg by mouth 3 (three) times daily.     . carvedilol (COREG) 6.25 MG tablet Take 1 tablet (6.25 mg total) by mouth 2 (two) times daily with a meal. (Patient taking differently: Take 6.25 mg by mouth every Monday,Wednesday,Friday, and Sunday at 6 PM. ) 60 tablet 1  . gabapentin (NEURONTIN) 300 MG capsule Take 1 capsule (300 mg total) by mouth at bedtime. 60 capsule 6  . Insulin Detemir (LEVEMIR FLEXPEN) 100 UNIT/ML Pen Inject 55 Units into the skin every morning. (Patient taking differently: Inject 55 Units into the skin every Monday,Wednesday,Friday, and Sunday at 6 PM. ) 15 mL 11  . insulin lispro (HUMALOG KWIKPEN) 100 UNIT/ML KiwkPen Inject 1-5 Units into the skin 3 (three) times daily as needed (if blood sugar is >150).     . Multiple Vitamins-Minerals (CENTRUM SILVER ADULT 50+ PO) Take 1 tablet by mouth every Monday,Wednesday,Friday, and Sunday at 6 PM.     . OVER THE COUNTER MEDICATION Place 1 spray into both nostrils daily as needed (for allergies).    . traMADol (ULTRAM) 50 MG tablet Take 1 tablet (50 mg total) by mouth every 6 (six) hours as needed. 30 tablet 0  . oxyCODONE (OXY IR/ROXICODONE) 5 MG immediate release tablet Take 1 tablet (5 mg total) by mouth every 8 (eight) hours as needed for moderate pain. (Patient not taking: Reported on 08/12/2015) 30 tablet 0   No current facility-administered medications for this visit.     Allergies  Allergen Reactions  . Vicodin [Hydrocodone-Acetaminophen] Itching  . Adhesive [Tape] Itching and Rash    Please use "paper" tape     REVIEW OF SYSTEMS:  (Positives checked otherwise negative)  CARDIOVASCULAR:   [ ]  chest pain,  [ ]  chest pressure,  [ ]  palpitations,  [ ]  shortness of breath when laying flat,  [ ]  shortness of breath with exertion,   [ ]  pain in feet when walking,  [ ]  pain in feet when laying flat, [ ]  history of blood clot in veins (DVT),  [ ]  history of phlebitis,  [ ]  swelling in legs,  [ ]   varicose veins  PULMONARY:   [ ]  productive cough,  [ ]  asthma,  [ ]  wheezing  NEUROLOGIC:   [ ]  weakness in arms or legs,  [ ]  numbness in arms or legs,  [ ]  difficulty speaking or slurred speech,  [ ]  temporary loss of vision in one eye,  [ ]  dizziness  HEMATOLOGIC:   [ ]  bleeding problems,  [ ]  problems with blood clotting too easily  MUSCULOSKEL:   [ ]  joint pain, [ ]  joint swelling  GASTROINTEST:   [ ]  vomiting blood,  [ ]  blood in stool     GENITOURINARY:   [ ]  burning with urination,  [ ]  blood in urine [x]  ESRD-HD: T/R/S  PSYCHIATRIC:   [ ]  history of major depression  INTEGUMENTARY:   [ ]  rashes,  [ ]   ulcers  CONSTITUTIONAL:   [ ]  fever,  [ ]  chills    For VQI Use Only  PRE-ADM LIVING: Home  AMB STATUS: Ambulatory  Physical Examination  Filed Vitals:   08/26/15 1119  BP: 125/70  Pulse: 87  Height: 5\' 5"  (1.651 m)  Weight: 275 lb (124.739 kg)  SpO2: 95%   Pulmonary: Sym exp, good air movt, CTAB, no rales, rhonchi, & wheezing  Cardiac: RRR, Nl S1, S2, no Murmurs, rubs or gallops  RUE: Incision is healed, skin feels warm, hand grip is 5/5, sensation in digits is intact, thrill markedly weaker than previous, bruit can be auscultated with wheezing sound, incision nearly healed  Medical Decision Making  Jordan [name] is a 75 y.o. 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  She is scheduled for this coming Monday 15 MAY 17.  I discussed with the patient the nature of angiographic procedures, especially the limited patencies of any endovascular intervention.    The patient is aware of that the risks of an angiographic procedure include but are not limited to: bleeding, infection, access site complications, renal failure, embolization, rupture of vessel, dissection, arteriovenous fistula,  possible need for emergent surgical intervention, possible need for surgical procedures to treat the patient's pathology, anaphylactic reaction to contrast, and stroke and death.    The patient is aware of the risks and agrees to proceed.  Thank you for allowing [place] to participate in this patient's care.  Adele Barthel, MD, FACS [facility] Office: [phone] Pager: [phone]  08/25/2015, 9:51 AM

## 2015-08-29 NOTE — Op Note (Signed)
OPERATIVE NOTE   PROCEDURE: 1.  right basilic vein transposition cannulation under ultrasound guidance 2.  right arm fistulogram 3.  Venoplasty of basilic vein x 2 (6 mm x 80 mm, 8 mm x 40 mm)  PRE-OPERATIVE DIAGNOSIS: failing right basilic vein transposition   POST-OPERATIVE DIAGNOSIS: same as above   SURGEON: Adele Barthel, MD  ANESTHESIA: local  ESTIMATED BLOOD LOSS: 5 cc  FINDING(S): 1. Patent fistula with 4 cm segment of >90% stenosis: resolved except for focal segment with <50% residual stenosis 2. Patent central venous structures with right internal jugular vein tunneled dialysis catheter  3. Patent arterial anastomosis with some taper  SPECIMEN(S):  None  CONTRAST: 50 cc  INDICATIONS: Jordan [name] is a 75 y.o. female who presents with failing right basilic vein transposition on follow exam.  The patient is scheduled for right arm fistulogram, possible intervention.  The patient is aware the risks include but are not limited to: bleeding, infection, thrombosis of the cannulated access, and possible anaphylactic reaction to the contrast.  The patient is aware of the risks of the procedure and elects to proceed forward.  DESCRIPTION: After full informed written consent was obtained, the patient was brought back to the angiography suite and placed supine upon the angiography table.  The patient was connected to monitoring equipment.  The right arm was prepped and draped in the standard fashion for a percutaneous access intervention.  Under ultrasound guidance, the right basilic vein transposition was cannulated with a micropuncture needle.  The microwire was advanced into the fistula and the needle was exchanged for the a microsheath, which was lodged 2 cm into the access.  The wire was removed and the sheath was connected to the IV extension tubing.  Hand injections were completed to image the access from the antecubitum up to the level of inferior vena cava.  Based  on the images, this patient will need: venoplasty of basilic vein.  A Benson wire was advanced into the axillary vein and the sheath was exchanged for a short 6-Fr sheath.  Based on the imaging, a 6 mm x 80 mm angioplasty balloon was selected.  The balloon was centered around the stenosis and inflated to 12 ATM for 3 minutes and deflated to 4 ATM for 2 minutes.  There was significant waist on inflation and there a recalcitrant area of stenosis upon inflation.  On completion, there was >30% stenosis still present in the segment more proximal.  At this point, a 8 mm x 40 mm angioplasty balloon was placed at the point of residual stenosis.  The balloon was centered around the stenosis and inflated to 12 ATM for 2 minutes.  On completion imaging, a >30% residual stenosis was present.  I replaced the balloon and reinflated the balloon at the point of maximal stenosis.  After deflating the balloon and removing it, I completed another hand injection.  There was great improvement but there remaining a <50% stenosis.  Based on the completion imaging, no further intervention is possible.  The wire and balloon were removed from the sheath.  A 4-0 Monocryl purse-string suture was sewn around the sheath.  The sheath was removed while tying down the suture.  A sterile bandage was applied to the puncture site.  I suspect this segment of maximal recalcitrant stenosis is likely to result in thrombosis of this fistula.  Will re-evaluate the fistula in the office in 2 weeks.  If the stenosis is recurring, will proceed with placement  of right upper arm arteriovenous graft.   COMPLICATIONS: none  CONDITION: stable   Adele Barthel, MD [facility] Office: [phone] Pager: [phone]  08/29/2015 3:12 PM

## 2015-08-29 NOTE — Progress Notes (Signed)
Pt states that she is unable to have either arm stuck. 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 Pt states that every time she comes,  they stick her multiple times and end up using her dialysis cath.  She states that she is unwilling for anyone to try.  Dr. Bridgett Larsson notified and order received to use dialysis cath for labs and procedure.  IVT consult ordered.

## 2015-08-29 NOTE — Interval H&P Note (Signed)
[facility]  History and Physical Update  The patient was interviewed and re-examined.  The patient's previous History and Physical has been reviewed and is unchanged from consult.  There is no change in the plan of care: R arm fistulogram, possible intervention.   I discussed with the patient the nature of angiographic procedures, especially the limited patencies of any endovascular intervention.    The patient is aware of that the risks of an angiographic procedure include but are not limited to: bleeding, infection, access site complications, renal failure, embolization, rupture of vessel, dissection, arteriovenous fistula, possible need for emergent surgical intervention, possible need for surgical procedures to treat the patient's pathology, anaphylactic reaction to contrast, and stroke and death.    The patient is aware of the risks and agrees to proceed.   Adele Barthel, MD [facility] Office: [phone] Pager: [phone]  08/29/2015, 12:00 PM

## 2015-08-29 NOTE — Telephone Encounter (Signed)
-----   Message from Mena Goes, RN sent at 08/29/2015  3:23 PM EDT ----- Regarding: schedule   ----- Message -----    From: Conrad [place], MD    Sent: 08/29/2015   3:20 PM      To: [facility] [address]  Jordan [name] [id number] 19-Jun-1940  PROCEDURE: 1.  right basilic vein transposition cannulation under ultrasound guidance 2.  right arm fistulogram 3.  Venoplasty of basilic vein x 2 (6 mm x 80 mm, 8 mm x 40 mm)  Follow-up: 2 weeks  Orders(s) for follow-up: R arm access duplex

## 2015-08-29 NOTE — Telephone Encounter (Signed)
Sched lab 6/5 at 4 and MD 6/9 at 3. Pt's vm is full, lm on daughter's ph#.

## 2015-08-29 NOTE — Discharge Instructions (Signed)
Fistulogram, Care After °Refer to this sheet in the next few weeks. These instructions provide you with information on caring for yourself after your procedure. Your health care provider may also give you more specific instructions. Your treatment has been planned according to current medical practices, but problems sometimes occur. Call your health care provider if you have any problems or questions after your procedure. °WHAT TO EXPECT AFTER THE PROCEDURE °After your procedure, it is typical to have the following: °· A small amount of discomfort in the area where the catheters were placed. °· A small amount of bruising around the fistula. °· Sleepiness and fatigue. °HOME CARE INSTRUCTIONS °· Rest at home for the day following your procedure. °· Do not drive or operate heavy machinery while taking pain medicine. °· Take medicines only as directed by your health care provider. °· Do not take baths, swim, or use a hot tub until your health care provider approves. You may shower 24 hours after the procedure or as directed by your health care provider. °· There are many different ways to close and cover an incision, including stitches, skin glue, and adhesive strips. Follow your health care provider's instructions on: °¨ Incision care. °¨ Bandage (dressing) changes and removal. °¨ Incision closure removal. °· Monitor your dialysis fistula carefully. °SEEK MEDICAL CARE IF: °· You have drainage, redness, swelling, or pain at your catheter site. °· You have a fever. °· You have chills. °SEEK IMMEDIATE MEDICAL CARE IF: °· You feel weak. °· You have trouble balancing. °· You have trouble moving your arms or legs. °· You have problems with your speech or vision. °· You can no longer feel a vibration or buzz when you put your fingers over your dialysis fistula. °· The limb that was used for the procedure: °¨ Swells. °¨ Is painful. °¨ Is cold. °¨ Is discolored, such as blue or pale white. °  °This information is not intended  to replace advice given to you by your health care provider. Make sure you discuss any questions you have with your health care provider. °  °Document Released: 08/17/2013 Document Reviewed: 08/17/2013 °Elsevier Interactive Patient Education ©2016 [employer] ° °

## 2015-08-30 ENCOUNTER — Encounter (HOSPITAL_COMMUNITY): Payer: Self-pay | Admitting: Vascular Surgery

## 2015-09-16 ENCOUNTER — Encounter: Payer: Self-pay | Admitting: Vascular Surgery

## 2015-09-19 ENCOUNTER — Ambulatory Visit (HOSPITAL_COMMUNITY): Payer: Medicare (Managed Care)

## 2015-09-20 ENCOUNTER — Ambulatory Visit (HOSPITAL_COMMUNITY)
Admission: RE | Admit: 2015-09-20 | Discharge: 2015-09-20 | Disposition: A | Discharge status: Home / Self Care | Payer: Medicare (Managed Care) | Source: Ambulatory Visit | Attending: Vascular Surgery | Admitting: Vascular Surgery

## 2015-09-20 DIAGNOSIS — G4730 Sleep apnea, unspecified: Secondary | ICD-10-CM | POA: Diagnosis not present

## 2015-09-20 DIAGNOSIS — I5022 Chronic systolic (congestive) heart failure: Secondary | ICD-10-CM | POA: Diagnosis not present

## 2015-09-20 DIAGNOSIS — K219 Gastro-esophageal reflux disease without esophagitis: Secondary | ICD-10-CM | POA: Insufficient documentation

## 2015-09-20 DIAGNOSIS — Z4931 Encounter for adequacy testing for hemodialysis: Secondary | ICD-10-CM | POA: Diagnosis not present

## 2015-09-20 DIAGNOSIS — N186 End stage renal disease: Secondary | ICD-10-CM | POA: Diagnosis present

## 2015-09-20 DIAGNOSIS — E1122 Type 2 diabetes mellitus with diabetic chronic kidney disease: Secondary | ICD-10-CM | POA: Insufficient documentation

## 2015-09-20 DIAGNOSIS — I132 Hypertensive heart and chronic kidney disease with heart failure and with stage 5 chronic kidney disease, or end stage renal disease: Secondary | ICD-10-CM | POA: Insufficient documentation

## 2015-09-22 NOTE — Progress Notes (Signed)
Postoperative Access Visit   History of Present Illness  Jordan [name] is a 75 y.o. year old female who presents for postoperative follow-up for: R BVT PTA x 2 (Date: 08/29/15).  The patient's wounds are healed.  The patient notes no steal symptoms.  The patient is  able to complete their activities of daily living.  The patient's current symptoms are: none.  Past Medical History  Diagnosis Date  . Breast cancer ([place])   . Diabetes mellitus with ESRD (end-stage renal disease) ([place])   . Hypertension   . Arthritis   . COPD (chronic obstructive pulmonary disease) ([place])   . Renal disorder     Family reports acute renal failure  . GERD (gastroesophageal reflux disease)   . CKD (chronic kidney disease)   . Systolic CHF, chronic ([place]) 12/23/2014  . Sleep apnea     does not wear CPAP    Past Surgical History  Procedure Laterality Date  . Breast surgery Left   . Cardiac catheterization [name] 10/19/2014    Procedure: Right/Left Heart Cath and Coronary Angiography;  Surgeon: Peter M Martinique, MD;  Location: [facility];  Service: Cardiovascular;  Laterality: [name];  . Av fistula placement Right 10/26/2014    Procedure: Right Brachiocephalic Arteriovenous FISTULA CREATION;  Surgeon: Conrad [place], MD;  Location: [facility];  Service: Vascular;  Laterality: Right;  . Tee without cardioversion [name] 12/27/2014    Procedure: TRANSESOPHAGEAL ECHOCARDIOGRAM (TEE);  Surgeon: Thayer Headings, MD;  Location: [facility];  Service: Cardiovascular;  Laterality: [name];  . Peripheral vascular catheterization [name] 01/24/2015    Procedure: Fistulagram;  Surgeon: Conrad [place], MD;  Location: [facility];  Service: Cardiovascular;  Laterality: [name];  . Fistula superficialization Right 01/26/2015    Procedure: Right BRACHIOCEPHALIC ARTERIOVENOUS FISTULA SUPERFICIALIZATION with side branch ligation;  Surgeon: Conrad [place], MD;  Location: [facility];  Service: Vascular;  Laterality: Right;  . Thrombectomy w/  embolectomy Right 03/07/2015    Procedure: THROMBECTOMY OF RIGHT BRACHIOCEPHALIC ARTERIOVENOUS FISTULA;  Surgeon: Conrad [place], MD;  Location: [facility];  Service: Vascular;  Laterality: Right;  . Ligation of arteriovenous  fistula Right 03/07/2015    Procedure: LIGATION OF RIGHT BRACHIOCEPHALIC ARTERIOVENOUS  FISTULA;  Surgeon: Conrad [place], MD;  Location: [facility];  Service: Vascular;  Laterality: Right;  . Bascilic vein transposition Right 04/13/2015    Procedure: RIGHT FIRST STAGE [place];  Surgeon: Conrad [place], MD;  Location: [facility];  Service: Vascular;  Laterality: Right;  . Bascilic vein transposition Right 07/11/2015    Procedure: SECOND STAGE BASILIC VEIN TRANSPOSITION;  Surgeon: Conrad [place], MD;  Location: [facility];  Service: Vascular;  Laterality: Right;  . Peripheral vascular catheterization [name] 08/29/2015    Procedure: Fistulagram;  Surgeon: Conrad [place], MD;  Location: [facility];  Service: Cardiovascular;  Laterality: [name];  . Peripheral vascular catheterization Right 08/29/2015    Procedure: Peripheral Vascular Balloon Angioplasty;  Surgeon: Conrad , MD;  Location: [facility];  Service: Cardiovascular;  Laterality: Right;  arm fistula    Social History   Social History  . Marital Status: Single    Spouse Name: [name]  . Number of Children: [name]  . Years of Education: [name]   Occupational History  . Not on file.   Social History Main Topics  . Smoking status: Never Smoker   . Smokeless tobacco: Never Used  . Alcohol Use: No  .  Drug Use: No  . Sexual Activity: Not on file   Other Topics Concern  . Not on file   Social History Narrative    Family History  Problem Relation Age of Onset  . Diabetes Mother   . Hypertension Mother   . Stroke Sister   . Hypertension Sister   . Heart attack Neg Hx     Current Outpatient Prescriptions  Medication Sig Dispense Refill  . albuterol (PROVENTIL HFA;VENTOLIN HFA) 108 (90 BASE) MCG/ACT inhaler  Inhale 1-2 puffs into the lungs every 6 (six) hours as needed for wheezing or shortness of breath. Reported on 07/07/2015    . albuterol-ipratropium (COMBIVENT) 18-103 MCG/ACT inhaler Inhale 1 puff into the lungs daily as needed for wheezing or shortness of breath. Reported on 06/24/2015    . aspirin 81 MG tablet Take 81 mg by mouth daily.    . calcium carbonate (TUMS EX) 750 MG chewable tablet Chew 750 mg by mouth 3 (three) times daily.     . carvedilol (COREG) 6.25 MG tablet Take 1 tablet (6.25 mg total) by mouth 2 (two) times daily with a meal. (Patient taking differently: Take 6.25 mg by mouth every Monday,Wednesday,Friday, and Sunday at 6 PM. ) 60 tablet 1  . fluticasone (FLONASE) 50 MCG/ACT nasal spray Place 1 spray into both nostrils daily as needed for allergies.    Marland Kitchen gabapentin (NEURONTIN) 300 MG capsule Take 1 capsule (300 mg total) by mouth at bedtime. 60 capsule 6  . Insulin Detemir (LEVEMIR FLEXPEN) 100 UNIT/ML Pen Inject 55 Units into the skin every morning. (Patient taking differently: Inject 55 Units into the skin every Monday,Wednesday,Friday, and Sunday at 6 PM. ) 15 mL 11  . insulin lispro (HUMALOG KWIKPEN) 100 UNIT/ML KiwkPen Inject 1-5 Units into the skin 3 (three) times daily as needed (if blood sugar is >150).     . Multiple Vitamins-Minerals (CENTRUM SILVER ADULT 50+ PO) Take 1 tablet by mouth every Monday,Wednesday,Friday, and Sunday at 6 PM.     . oxyCODONE (OXY IR/ROXICODONE) 5 MG immediate release tablet Take 1 tablet (5 mg total) by mouth every 8 (eight) hours as needed for moderate pain. 30 tablet 0  . traMADol (ULTRAM) 50 MG tablet Take 1 tablet (50 mg total) by mouth every 6 (six) hours as needed. (Patient taking differently: Take 50 mg by mouth every 6 (six) hours as needed (For pain.). ) 30 tablet 0   No current facility-administered medications for this visit.     Allergies  Allergen Reactions  . Vicodin [Hydrocodone-Acetaminophen] Itching  . Adhesive [Tape]  Itching and Rash    Please use "paper" tape     REVIEW OF SYSTEMS:  (Positives checked otherwise negative)  CARDIOVASCULAR:   [ ]  chest pain,  [ ]  chest pressure,  [ ]  palpitations,  [ ]  shortness of breath when laying flat,  [ ]  shortness of breath with exertion,   [ ]  pain in feet when walking,  [ ]  pain in feet when laying flat, [ ]  history of blood clot in veins (DVT),  [ ]  history of phlebitis,  [ ]  swelling in legs,  [ ]  varicose veins  PULMONARY:   [ ]  productive cough,  [ ]  asthma,  [ ]  wheezing  NEUROLOGIC:   [ ]  weakness in arms or legs,  [ ]  numbness in arms or legs,  [ ]  difficulty speaking or slurred speech,  [ ]  temporary loss of vision in one eye,  [ ]  dizziness  HEMATOLOGIC:   [ ]  bleeding problems,  [ ]  problems with blood clotting too easily  MUSCULOSKEL:   [ ]  joint pain, [ ]  joint swelling  GASTROINTEST:   [ ]  vomiting blood,  [ ]  blood in stool     GENITOURINARY:   [ ]  burning with urination,  [ ]  blood in urine [x]  ESRD-HD: T/R/S  PSYCHIATRIC:   [ ]  history of major depression  INTEGUMENTARY:   [ ]  rashes,  [ ]  ulcers  CONSTITUTIONAL:   [ ]  fever,  [ ]  chills    For VQI Use Only  PRE-ADM LIVING: Home  AMB STATUS: Ambulatory  Physical Examination Filed Vitals:   09/23/15 1607  BP: 120/64  Pulse: 72  Temp: 98.4 F (36.9 C)  Resp: 24   Pulmonary: Sym exp, good air movt, CTAB, no rales, rhonchi, & wheezing  Cardiac: RRR, Nl S1, S2, no Murmurs, rubs or gallops  RUE: inc are healed, skin feels warm, hand grip is 5/5, sensation in digits is intact, palpable thrill, bruit can be auscultated, ulnar palpable, essentially non-palpable thrill, weak bruit  Medical Decision Making  Lecresha Mannering [name] is a 75 y.o. year old female who presents s/p R BVT PTA x 2 for failing R BVT   The patient's options include: converting the fistula to RUA AVG vs replacement of a segment of the fistula with a short segment of graft.      The patient has elected to convert the R BVT to RUA AVG.  I excise the patient's cicatrix also at the same time per her request. Risk, benefits, and alternatives to access surgery were discussed.   The patient is aware the risks include but are not limited to: bleeding, infection, steal syndrome, nerve damage, ischemic monomelic neuropathy, failure to mature, need for additional procedures, death and stroke.   The patient agrees to proceed forward with the procedure.  Thank you for allowing [place] to participate in this patient's care.  Adele Barthel, MD, FACS [facility] Office: [phone] Pager: [phone]

## 2015-09-23 ENCOUNTER — Other Ambulatory Visit: Payer: Self-pay

## 2015-09-23 ENCOUNTER — Encounter: Payer: Self-pay | Admitting: Vascular Surgery

## 2015-09-23 ENCOUNTER — Ambulatory Visit (INDEPENDENT_AMBULATORY_CARE_PROVIDER_SITE_OTHER): Payer: Medicare (Managed Care) | Admitting: Vascular Surgery

## 2015-09-23 VITALS — BP 120/64 | HR 72 | Temp 98.4°F | Resp 24 | Ht 65.0 in | Wt 280.0 lb

## 2015-09-23 DIAGNOSIS — Z992 Dependence on renal dialysis: Secondary | ICD-10-CM

## 2015-09-23 DIAGNOSIS — N186 End stage renal disease: Secondary | ICD-10-CM

## 2015-10-04 ENCOUNTER — Encounter (HOSPITAL_COMMUNITY): Payer: Self-pay | Admitting: *Deleted

## 2015-10-04 MED ORDER — DEXTROSE 5 % IV SOLN
1.5000 g | INTRAVENOUS | Status: AC
  Administered 2015-10-05: 1.5 g via INTRAVENOUS
  Filled 2015-10-04: qty 1.5

## 2015-10-04 NOTE — Progress Notes (Signed)
Pt denies SOB and chest pain. Pt stated " everything is the same , I know what to do ," when attempting to provide pre-op instructions.

## 2015-10-05 ENCOUNTER — Encounter (HOSPITAL_COMMUNITY): Payer: Self-pay | Admitting: Surgery

## 2015-10-05 ENCOUNTER — Ambulatory Visit (HOSPITAL_COMMUNITY): Payer: Medicare (Managed Care) | Admitting: Certified Registered"

## 2015-10-05 ENCOUNTER — Encounter (HOSPITAL_COMMUNITY)
Admission: RE | Disposition: A | Discharge status: Home / Self Care | Payer: Self-pay | Source: Ambulatory Visit | Attending: Vascular Surgery

## 2015-10-05 ENCOUNTER — Ambulatory Visit (HOSPITAL_COMMUNITY)
Admission: RE | Admit: 2015-10-05 | Discharge: 2015-10-05 | Disposition: A | Discharge status: Home / Self Care | Payer: Medicare (Managed Care) | Source: Ambulatory Visit | Attending: Vascular Surgery | Admitting: Vascular Surgery

## 2015-10-05 DIAGNOSIS — I509 Heart failure, unspecified: Secondary | ICD-10-CM | POA: Diagnosis not present

## 2015-10-05 DIAGNOSIS — I110 Hypertensive heart disease with heart failure: Secondary | ICD-10-CM | POA: Insufficient documentation

## 2015-10-05 DIAGNOSIS — G4730 Sleep apnea, unspecified: Secondary | ICD-10-CM | POA: Diagnosis not present

## 2015-10-05 DIAGNOSIS — I252 Old myocardial infarction: Secondary | ICD-10-CM | POA: Diagnosis not present

## 2015-10-05 DIAGNOSIS — L905 Scar conditions and fibrosis of skin: Secondary | ICD-10-CM | POA: Diagnosis not present

## 2015-10-05 DIAGNOSIS — E1122 Type 2 diabetes mellitus with diabetic chronic kidney disease: Secondary | ICD-10-CM | POA: Diagnosis not present

## 2015-10-05 DIAGNOSIS — T82898A Other specified complication of vascular prosthetic devices, implants and grafts, initial encounter: Secondary | ICD-10-CM | POA: Diagnosis not present

## 2015-10-05 DIAGNOSIS — I2510 Atherosclerotic heart disease of native coronary artery without angina pectoris: Secondary | ICD-10-CM | POA: Diagnosis not present

## 2015-10-05 DIAGNOSIS — N185 Chronic kidney disease, stage 5: Secondary | ICD-10-CM | POA: Diagnosis not present

## 2015-10-05 DIAGNOSIS — J449 Chronic obstructive pulmonary disease, unspecified: Secondary | ICD-10-CM | POA: Diagnosis not present

## 2015-10-05 DIAGNOSIS — Y832 Surgical operation with anastomosis, bypass or graft as the cause of abnormal reaction of the patient, or of later complication, without mention of misadventure at the time of the procedure: Secondary | ICD-10-CM | POA: Insufficient documentation

## 2015-10-05 HISTORY — PX: LIGATION OF ARTERIOVENOUS  FISTULA: SHX5948

## 2015-10-05 HISTORY — PX: ARTERIOVENOUS GRAFT PLACEMENT: SUR1029

## 2015-10-05 HISTORY — PX: AV FISTULA PLACEMENT: SHX1204

## 2015-10-05 LAB — GLUCOSE, CAPILLARY
GLUCOSE-CAPILLARY: 151 mg/dL — AB (ref 65–99)
Glucose-Capillary: 167 mg/dL — ABNORMAL HIGH (ref 65–99)

## 2015-10-05 SURGERY — INSERTION OF ARTERIOVENOUS (AV) GORE-TEX GRAFT ARM
Anesthesia: General | Site: Arm Upper | Laterality: Right

## 2015-10-05 MED ORDER — PROTAMINE SULFATE 10 MG/ML IV SOLN
INTRAVENOUS | Status: DC | PRN
  Administered 2015-10-05: 15 mg via INTRAVENOUS
  Administered 2015-10-05: 10 mg via INTRAVENOUS
  Administered 2015-10-05: 15 mg via INTRAVENOUS
  Administered 2015-10-05 (×2): 10 mg via INTRAVENOUS

## 2015-10-05 MED ORDER — CHLORHEXIDINE GLUCONATE CLOTH 2 % EX PADS: 6.0000 | MEDICATED_PAD | Freq: Once | CUTANEOUS | Status: DC

## 2015-10-05 MED ORDER — HEPARIN SODIUM (PORCINE) 1000 UNIT/ML IJ SOLN
INTRAMUSCULAR | Status: AC
  Filled 2015-10-05: qty 2

## 2015-10-05 MED ORDER — FENTANYL CITRATE (PF) 100 MCG/2ML IJ SOLN
25.0000 ug | INTRAMUSCULAR | Status: DC | PRN
  Administered 2015-10-05 (×3): 50 ug via INTRAVENOUS

## 2015-10-05 MED ORDER — GLYCOPYRROLATE 0.2 MG/ML IV SOSY
PREFILLED_SYRINGE | INTRAVENOUS | Status: AC
  Filled 2015-10-05: qty 3

## 2015-10-05 MED ORDER — FENTANYL CITRATE (PF) 100 MCG/2ML IJ SOLN
INTRAMUSCULAR | Status: AC
  Filled 2015-10-05: qty 2

## 2015-10-05 MED ORDER — PROPOFOL 10 MG/ML IV BOLUS
INTRAVENOUS | Status: AC
  Filled 2015-10-05: qty 20

## 2015-10-05 MED ORDER — PHENYLEPHRINE HCL 10 MG/ML IJ SOLN
10.0000 mg | INTRAVENOUS | Status: DC | PRN
  Administered 2015-10-05: 20 ug/min via INTRAVENOUS

## 2015-10-05 MED ORDER — PHENYLEPHRINE HCL 10 MG/ML IJ SOLN
INTRAMUSCULAR | Status: DC | PRN
  Administered 2015-10-05 (×3): 120 ug via INTRAVENOUS
  Administered 2015-10-05: 200 ug via INTRAVENOUS
  Administered 2015-10-05: 160 ug via INTRAVENOUS

## 2015-10-05 MED ORDER — PHENYLEPHRINE 40 MCG/ML (10ML) SYRINGE FOR IV PUSH (FOR BLOOD PRESSURE SUPPORT)
PREFILLED_SYRINGE | INTRAVENOUS | Status: AC
  Filled 2015-10-05: qty 10

## 2015-10-05 MED ORDER — PROMETHAZINE HCL 25 MG/ML IJ SOLN: 6.2500 mg | INTRAMUSCULAR | Status: DC | PRN

## 2015-10-05 MED ORDER — SODIUM CHLORIDE 0.9 % IV SOLN
INTRAVENOUS | Status: DC
  Administered 2015-10-05 (×2): via INTRAVENOUS

## 2015-10-05 MED ORDER — HEPARIN SODIUM (PORCINE) 1000 UNIT/ML IJ SOLN
INTRAMUSCULAR | Status: DC | PRN
  Administered 2015-10-05: 12000 [IU] via INTRAVENOUS

## 2015-10-05 MED ORDER — ONDANSETRON HCL 4 MG/2ML IJ SOLN
INTRAMUSCULAR | Status: DC | PRN
  Administered 2015-10-05: 4 mg via INTRAVENOUS

## 2015-10-05 MED ORDER — FENTANYL CITRATE (PF) 250 MCG/5ML IJ SOLN
INTRAMUSCULAR | Status: DC | PRN
  Administered 2015-10-05: 100 ug via INTRAVENOUS
  Administered 2015-10-05: 50 ug via INTRAVENOUS

## 2015-10-05 MED ORDER — PROPOFOL 10 MG/ML IV BOLUS
INTRAVENOUS | Status: DC | PRN
  Administered 2015-10-05: 120 mg via INTRAVENOUS
  Administered 2015-10-05: 20 mg via INTRAVENOUS

## 2015-10-05 MED ORDER — 0.9 % SODIUM CHLORIDE (POUR BTL) OPTIME
TOPICAL | Status: DC | PRN
  Administered 2015-10-05: 1000 mL

## 2015-10-05 MED ORDER — ROCURONIUM BROMIDE 50 MG/5ML IV SOLN
INTRAVENOUS | Status: AC
  Filled 2015-10-05: qty 1

## 2015-10-05 MED ORDER — SODIUM CHLORIDE 0.9 % IV SOLN
INTRAVENOUS | Status: DC | PRN
  Administered 2015-10-05: 500 mL

## 2015-10-05 MED ORDER — GLYCOPYRROLATE 0.2 MG/ML IJ SOLN
INTRAMUSCULAR | Status: DC | PRN
  Administered 2015-10-05 (×2): 0.1 mg via INTRAVENOUS

## 2015-10-05 MED ORDER — SUCCINYLCHOLINE CHLORIDE 20 MG/ML IJ SOLN
INTRAMUSCULAR | Status: DC | PRN
  Administered 2015-10-05: 120 mg via INTRAVENOUS

## 2015-10-05 MED ORDER — HEMOSTATIC AGENTS (NO CHARGE) OPTIME
TOPICAL | Status: DC | PRN
  Administered 2015-10-05: 1 via TOPICAL

## 2015-10-05 MED ORDER — OXYCODONE HCL 5 MG PO TABS: 5.0000 mg | ORAL_TABLET | Freq: Three times a day (TID) | ORAL | Status: DC | PRN

## 2015-10-05 MED ORDER — FENTANYL CITRATE (PF) 250 MCG/5ML IJ SOLN
INTRAMUSCULAR | Status: AC
  Filled 2015-10-05: qty 5

## 2015-10-05 MED ORDER — PROTAMINE SULFATE 10 MG/ML IV SOLN
INTRAVENOUS | Status: AC
  Filled 2015-10-05: qty 10

## 2015-10-05 MED ORDER — LIDOCAINE HCL (CARDIAC) 20 MG/ML IV SOLN
INTRAVENOUS | Status: DC | PRN
  Administered 2015-10-05: 80 mg via INTRAVENOUS

## 2015-10-05 MED ORDER — LIDOCAINE HCL (PF) 1 % IJ SOLN
INTRAMUSCULAR | Status: AC
  Filled 2015-10-05: qty 30

## 2015-10-05 SURGICAL SUPPLY — 47 items
ARMBAND PINK RESTRICT EXTREMIT (MISCELLANEOUS) ×2 IMPLANT
CANISTER SUCTION 2500CC (MISCELLANEOUS) ×2 IMPLANT
CLIP TI MEDIUM 6 (CLIP) ×2 IMPLANT
CLIP TI WIDE RED SMALL 6 (CLIP) ×2 IMPLANT
COVER PROBE W GEL 5X96 (DRAPES) ×2 IMPLANT
DECANTER SPIKE VIAL GLASS SM (MISCELLANEOUS) ×2 IMPLANT
ELECT REM PT RETURN 9FT ADLT (ELECTROSURGICAL) ×2
ELECTRODE REM PT RTRN 9FT ADLT (ELECTROSURGICAL) ×1 IMPLANT
GAUZE SPONGE 4X4 12PLY STRL (GAUZE/BANDAGES/DRESSINGS) ×2 IMPLANT
GAUZE SPONGE 4X4 16PLY XRAY LF (GAUZE/BANDAGES/DRESSINGS) ×2 IMPLANT
GEL ULTRASOUND 20GR AQUASONIC (MISCELLANEOUS) IMPLANT
GLOVE BIO SURGEON STRL SZ 6.5 (GLOVE) ×4 IMPLANT
GLOVE BIO SURGEON STRL SZ7 (GLOVE) ×2 IMPLANT
GLOVE BIOGEL PI IND STRL 6.5 (GLOVE) ×6 IMPLANT
GLOVE BIOGEL PI IND STRL 7.5 (GLOVE) ×1 IMPLANT
GLOVE BIOGEL PI INDICATOR 6.5 (GLOVE) ×6
GLOVE BIOGEL PI INDICATOR 7.5 (GLOVE) ×1
GLOVE ECLIPSE 6.5 STRL STRAW (GLOVE) ×4 IMPLANT
GOWN STRL REUS W/ TWL LRG LVL3 (GOWN DISPOSABLE) ×3 IMPLANT
GOWN STRL REUS W/TWL LRG LVL3 (GOWN DISPOSABLE) ×3
GRAFT GORETEX STND 4X7 (Vascular Products) ×2 IMPLANT
GRAFT GORETEXSTD 4X7 (Vascular Products) ×1 IMPLANT
HEMOSTAT SPONGE AVITENE ULTRA (HEMOSTASIS) IMPLANT
KIT BASIN OR (CUSTOM PROCEDURE TRAY) ×2 IMPLANT
KIT ROOM TURNOVER OR (KITS) ×2 IMPLANT
LIQUID BAND (GAUZE/BANDAGES/DRESSINGS) ×8 IMPLANT
NS IRRIG 1000ML POUR BTL (IV SOLUTION) ×2 IMPLANT
PACK CV ACCESS (CUSTOM PROCEDURE TRAY) ×2 IMPLANT
PAD ARMBOARD 7.5X6 YLW CONV (MISCELLANEOUS) ×4 IMPLANT
SPONGE LAP 18X18 X RAY DECT (DISPOSABLE) ×2 IMPLANT
SPONGE SURGIFOAM ABS GEL 100 (HEMOSTASIS) IMPLANT
SUT ETHILON 3 0 PS 1 (SUTURE) IMPLANT
SUT MNCRL AB 4-0 PS2 18 (SUTURE) ×4 IMPLANT
SUT PROLENE 5 0 C 1 24 (SUTURE) ×2 IMPLANT
SUT PROLENE 6 0 BV (SUTURE) ×4 IMPLANT
SUT PROLENE 7 0 BV 1 (SUTURE) ×4 IMPLANT
SUT SILK 0 TIES 10X30 (SUTURE) ×2 IMPLANT
SUT SILK 2 0 FS (SUTURE) ×2 IMPLANT
SUT VIC AB 2-0 CT1 27 (SUTURE) ×2
SUT VIC AB 2-0 CT1 TAPERPNT 27 (SUTURE) ×2 IMPLANT
SUT VIC AB 3-0 SH 27 (SUTURE) ×2
SUT VIC AB 3-0 SH 27X BRD (SUTURE) ×2 IMPLANT
SUT VICRYL 4-0 PS2 18IN ABS (SUTURE) ×2 IMPLANT
SWAB COLLECTION DEVICE MRSA (MISCELLANEOUS) IMPLANT
TUBE ANAEROBIC SPECIMEN COL (MISCELLANEOUS) IMPLANT
UNDERPAD 30X30 INCONTINENT (UNDERPADS AND DIAPERS) ×2 IMPLANT
WATER STERILE IRR 1000ML POUR (IV SOLUTION) ×2 IMPLANT

## 2015-10-05 NOTE — Interval H&P Note (Signed)
History and Physical Interval Note:  10/05/2015 7:54 AM  Jordan [name]  has presented today for surgery, with the diagnosis of End Stage Renal Disease N18.6  The various methods of treatment have been discussed with the patient and family. After consideration of risks, benefits and other options for treatment, the patient has consented to  Procedure(s): INSERTION OF ARTERIOVENOUS (AV) GORE-TEX GRAFT ARM (Right) LIGATION OF BASILIC VEIN TRANSPOSITION; EXCISION OF CICATRIX (Right) as a surgical intervention .  The patient's history has been reviewed, patient examined, no change in status, stable for surgery.  I have reviewed the patient's chart and labs.  Questions were answered to the patient's satisfaction.     Adele Barthel

## 2015-10-05 NOTE — Anesthesia Postprocedure Evaluation (Signed)
Anesthesia Post Note  Patient: Jordan [name]  Procedure(s) Performed: Procedure(s) (LRB): INSERTION OF  ARTERIOVENOUS (AV) GORE-TEX GRAFT RIGHT ARM (Right) LIGATION OF RIGHT BASILIC VEIN TRANSPOSITION; EXCISION OF CICATRIX (Right)  Patient location during evaluation: PACU Anesthesia Type: General Level of consciousness: awake and alert Pain management: pain level controlled Vital Signs Assessment: post-procedure vital signs reviewed and stable Respiratory status: spontaneous breathing, nonlabored ventilation, respiratory function stable and patient connected to nasal cannula oxygen Cardiovascular status: blood pressure returned to baseline and stable Postop Assessment: no signs of nausea or vomiting Anesthetic complications: no    Last Vitals:  Filed Vitals:   10/05/15 1310 10/05/15 1330  BP: 157/60 167/64  Pulse: 78   Temp:    Resp: 16 18    Last Pain:  Filed Vitals:   10/05/15 1354  PainSc: 5                  Zenaida Deed

## 2015-10-05 NOTE — Op Note (Addendum)
OPERATIVE NOTE   PROCEDURE:   1.  Ligation of right basilic vein transposition  2.  Right upper arm arteriovenous graft 3.  Excision of cicatrix (15 cm)  PRE-OPERATIVE DIAGNOSIS: end stage renal disease   POST-OPERATIVE DIAGNOSIS: same as above   SURGEON: Adele Barthel, MD  ASSISTANT(S): Silva Bandy, PAC   ANESTHESIA: general  ESTIMATED BLOOD LOSS: 50 cc  FINDING(S): 1. Essentially near occluded right basilic vein transposition  2. Palpable thrill in fistula at end of case 3. Dopplerable right radial signal at end of case  SPECIMEN(S):  none  INDICATIONS:   Jordan [name] is a 75 y.o. female who presents with end stage renal disease and failing right basilic vein transposition.  I had attempted to salvage this right basilic vein transposition with venoplasty, but the proximal segment redeveloped a high grade proximal stenosis.  i recommended: ligating the right basilic vein transposition and placing a right upper arm arteriovenous graft.  I would also resect the prior cicatrix as it was in the path of the graft.  Risk, benefits, and alternatives to access surgery were discussed.  The patient is aware the risks include but are not limited to: bleeding, infection, steal syndrome, nerve damage, ischemic monomelic neuropathy, failure to mature, and need for additional procedures.  The patient is aware of the risks and elects to proceed forward.  DESCRIPTION: After full informed written consent was obtained from the patient, the patient was brought back to the operating room and placed supine upon the operating table.  The patient was given IV antibiotics prior to proceeding.  After obtaining adequate sedation, the patient was prepped and draped in standard fashion for a right arm access procedure.  I turned my attention first to the antecubitum.  Under ultrasound guidance, I identified the location of the prior anastomosis and marked it on the skin.  I then examined the bicipital  groove and identified the high brachial vein and marked it on the skin.  I made an incision over the prior anastomosis and dissected down through the subcutaneous tissue with electrocautery to dissect out the brachial artery.  The artery was about 3 mm externally.  It was controlled proximally and distally with vessel loops.  Distally I could find the prior anastomosis.  Using a doppler, I detected some residual flow in this distal segment of the fistula, but distally there was essential no difference in the radial signal, which was biphasic, with compression of the fistula, which suggests this fistula is nearly occluded.  I placed two 2-0 silk ties distal to the anastomosis, leaving a vein hood on the brachial artery as a patch angioplasty.    I then turned my attention to the high bicipital groove.  I made an incision over the high brachial vein and dissected through the subcutaneous tissue until I reached the high brachial vein.  Externally, it appeared to be 5 mm in diameter.  I then dissected out this vein proximally and distal.  I took a metal Gore tunneler and dissected from the antecubital exposure up to the high bicipital incision.  Then I delivered the 4 x 7-mm stretch Gore-Tex graft, through this metal tunneler and then pulled out the metal tunneler leaving the graft in place.  The 4-mm end was left on the antecubital side and the 7 mm toward the axillary side.  I then gave the patient 12000 units of heparin to gain therapeutic anticoagulation.  After waiting 3 minutes, I placed the brachial artery under  tension proximally and distally with vessel loops, made an arteriotomy and extended it with a Potts scissor to meet the dimension of the 4 mm end of the graft.  I sewed the 4-mm end of the graft to this arteriotomy with a running stitch of 6-0 Prolene in an end-to-side configuration.  At this point, then I completed the anastomosis in the usual fashion.  I released the vessel loops on the inflow and  allowed the artery to decompress through the graft. There was good continuous bleeding through this graft.  I clamped the graft near its arterial anastomosis and sucked out all the blood in the graft and loaded the graft with heparinized saline.  At this point, I pulled the graft to appropriate length and reset my exposure of the high brachial vein.  I tied off the high brachial vein distally with a 2-0 silk and then transected it.  There was good venous backbleeding from the vein.  Then, I injected some heparinized saline into this vein and then clamped it.  I then spatulated the vein to facilitate an end-to- end anastomosis.  I also spatulated the graft to facilitate an end-to-end anastomosis.  In the process of spatulating, I cut the graft to appropriate length for this anastomosis.  This graft was sewn to the vein in an end-to-end configuration with a 5-0 Prolene.  Prior to completing this anastomosis, I allowed the vein to back bleed and then I also allowed the artery to bleed in an antegrade fashion.  I completed this anastomosis in the usual fashion, and then irrigated out the high bicipital exposure and then placed thrombin and Gelfoam.  I then turned my attention back to the antecubitum.  The distal radial signal was dopplerable.  The venous outflow had a flow signature consistent with widely patent arteriovenous graft.  There was a palpable thrill in the venous outflow.    At this point, I washed out both incision and packed each with Avitene.  I gave the patient 60 mg of Protamine to reverse anticoagulation.  After waiting a few minutes, bleeding had slowed down.  I repaired the sutures in each incision with 7-0 prolene stitches.  I washed out each incision and repacked each incision with Avitene.  After a few minutes, there was no more active bleeding.  The subcutaneous tissue in each incisions was reapproximated with a double layer of 2-0 Vicryl.  The skin in each incision was then reapproximated with  a running subcuticular stitch of 4-0 Monocryl.  The skin at each incision was then cleaned, dried, and Dermabond used to reinforce the skin closure.    At this point, I felt that the cicatrix from the prior superficialization of the right brachiocephalic arteriovenous fistula was going to interfere with cannulation due to its thickness.  Using a 15 blade, I made an incision around the cicatrix and then finished releasing it with electrocautery.  The incision measure roughly 15 cm in length.  At this point, I had to controlled bleeding points with electrocautery.  Avitene was packed into this superficial incision.  After a few minutes, no further active bleeding was occurring.  The skin was then reapproximated with running subcuticular stitch of 4-0 Monocryl.  The skin was then cleaned, dried, and then the skin closure was reinforced with Dermabond.     COMPLICATIONS: none  CONDITION: stable   Adele Barthel, MD [facility] Office: [phone] Pager: [phone]  10/05/2015, 11:20 AM

## 2015-10-05 NOTE — H&P (View-Only) (Signed)
Postoperative Access Visit   History of Present Illness  Jordan [name] is a 75 y.o. year old female who presents for postoperative follow-up for: R BVT PTA x 2 (Date: 08/29/15).  The patient's wounds are healed.  The patient notes no steal symptoms.  The patient is  able to complete their activities of daily living.  The patient's current symptoms are: none.  Past Medical History  Diagnosis Date  . Breast cancer ([place])   . Diabetes mellitus with ESRD (end-stage renal disease) ([place])   . Hypertension   . Arthritis   . COPD (chronic obstructive pulmonary disease) ([place])   . Renal disorder     Family reports acute renal failure  . GERD (gastroesophageal reflux disease)   . CKD (chronic kidney disease)   . Systolic CHF, chronic ([place]) 12/23/2014  . Sleep apnea     does not wear CPAP    Past Surgical History  Procedure Laterality Date  . Breast surgery Left   . Cardiac catheterization [name] 10/19/2014    Procedure: Right/Left Heart Cath and Coronary Angiography;  Surgeon: Peter M Martinique, MD;  Location: [facility];  Service: Cardiovascular;  Laterality: [name];  . Av fistula placement Right 10/26/2014    Procedure: Right Brachiocephalic Arteriovenous FISTULA CREATION;  Surgeon: Conrad Conejos, MD;  Location: [facility];  Service: Vascular;  Laterality: Right;  . Tee without cardioversion [name] 12/27/2014    Procedure: TRANSESOPHAGEAL ECHOCARDIOGRAM (TEE);  Surgeon: Thayer Headings, MD;  Location: [facility];  Service: Cardiovascular;  Laterality: [name];  . Peripheral vascular catheterization [name] 01/24/2015    Procedure: Fistulagram;  Surgeon: Conrad Allisonia, MD;  Location: [facility];  Service: Cardiovascular;  Laterality: [name];  . Fistula superficialization Right 01/26/2015    Procedure: Right BRACHIOCEPHALIC ARTERIOVENOUS FISTULA SUPERFICIALIZATION with side branch ligation;  Surgeon: Conrad Owensville, MD;  Location: [facility];  Service: Vascular;  Laterality: Right;  . Thrombectomy w/  embolectomy Right 03/07/2015    Procedure: THROMBECTOMY OF RIGHT BRACHIOCEPHALIC ARTERIOVENOUS FISTULA;  Surgeon: Conrad [place], MD;  Location: [facility];  Service: Vascular;  Laterality: Right;  . Ligation of arteriovenous  fistula Right 03/07/2015    Procedure: LIGATION OF RIGHT BRACHIOCEPHALIC ARTERIOVENOUS  FISTULA;  Surgeon: Conrad Providence, MD;  Location: [facility];  Service: Vascular;  Laterality: Right;  . Bascilic vein transposition Right 04/13/2015    Procedure: RIGHT FIRST STAGE Victoria Vera;  Surgeon: Conrad Burnham, MD;  Location: [facility];  Service: Vascular;  Laterality: Right;  . Bascilic vein transposition Right 07/11/2015    Procedure: SECOND STAGE BASILIC VEIN TRANSPOSITION;  Surgeon: Conrad Evergreen, MD;  Location: [facility];  Service: Vascular;  Laterality: Right;  . Peripheral vascular catheterization [name] 08/29/2015    Procedure: Fistulagram;  Surgeon: Conrad Hartford, MD;  Location: [facility];  Service: Cardiovascular;  Laterality: [name];  . Peripheral vascular catheterization Right 08/29/2015    Procedure: Peripheral Vascular Balloon Angioplasty;  Surgeon: Conrad , MD;  Location: [facility];  Service: Cardiovascular;  Laterality: Right;  arm fistula    Social History   Social History  . Marital Status: Single    Spouse Name: [name]  . Number of Children: [name]  . Years of Education: [name]   Occupational History  . Not on file.   Social History Main Topics  . Smoking status: Never Smoker   . Smokeless tobacco: Never Used  . Alcohol Use: No  .  Drug Use: No  . Sexual Activity: Not on file   Other Topics Concern  . Not on file   Social History Narrative    Family History  Problem Relation Age of Onset  . Diabetes Mother   . Hypertension Mother   . Stroke Sister   . Hypertension Sister   . Heart attack Neg Hx     Current Outpatient Prescriptions  Medication Sig Dispense Refill  . albuterol (PROVENTIL HFA;VENTOLIN HFA) 108 (90 BASE) MCG/ACT inhaler  Inhale 1-2 puffs into the lungs every 6 (six) hours as needed for wheezing or shortness of breath. Reported on 07/07/2015    . albuterol-ipratropium (COMBIVENT) 18-103 MCG/ACT inhaler Inhale 1 puff into the lungs daily as needed for wheezing or shortness of breath. Reported on 06/24/2015    . aspirin 81 MG tablet Take 81 mg by mouth daily.    . calcium carbonate (TUMS EX) 750 MG chewable tablet Chew 750 mg by mouth 3 (three) times daily.     . carvedilol (COREG) 6.25 MG tablet Take 1 tablet (6.25 mg total) by mouth 2 (two) times daily with a meal. (Patient taking differently: Take 6.25 mg by mouth every Monday,Wednesday,Friday, and Sunday at 6 PM. ) 60 tablet 1  . fluticasone (FLONASE) 50 MCG/ACT nasal spray Place 1 spray into both nostrils daily as needed for allergies.    Marland Kitchen gabapentin (NEURONTIN) 300 MG capsule Take 1 capsule (300 mg total) by mouth at bedtime. 60 capsule 6  . Insulin Detemir (LEVEMIR FLEXPEN) 100 UNIT/ML Pen Inject 55 Units into the skin every morning. (Patient taking differently: Inject 55 Units into the skin every Monday,Wednesday,Friday, and Sunday at 6 PM. ) 15 mL 11  . insulin lispro (HUMALOG KWIKPEN) 100 UNIT/ML KiwkPen Inject 1-5 Units into the skin 3 (three) times daily as needed (if blood sugar is >150).     . Multiple Vitamins-Minerals (CENTRUM SILVER ADULT 50+ PO) Take 1 tablet by mouth every Monday,Wednesday,Friday, and Sunday at 6 PM.     . oxyCODONE (OXY IR/ROXICODONE) 5 MG immediate release tablet Take 1 tablet (5 mg total) by mouth every 8 (eight) hours as needed for moderate pain. 30 tablet 0  . traMADol (ULTRAM) 50 MG tablet Take 1 tablet (50 mg total) by mouth every 6 (six) hours as needed. (Patient taking differently: Take 50 mg by mouth every 6 (six) hours as needed (For pain.). ) 30 tablet 0   No current facility-administered medications for this visit.     Allergies  Allergen Reactions  . Vicodin [Hydrocodone-Acetaminophen] Itching  . Adhesive [Tape]  Itching and Rash    Please use "paper" tape     REVIEW OF SYSTEMS:  (Positives checked otherwise negative)  CARDIOVASCULAR:   [ ]  chest pain,  [ ]  chest pressure,  [ ]  palpitations,  [ ]  shortness of breath when laying flat,  [ ]  shortness of breath with exertion,   [ ]  pain in feet when walking,  [ ]  pain in feet when laying flat, [ ]  history of blood clot in veins (DVT),  [ ]  history of phlebitis,  [ ]  swelling in legs,  [ ]  varicose veins  PULMONARY:   [ ]  productive cough,  [ ]  asthma,  [ ]  wheezing  NEUROLOGIC:   [ ]  weakness in arms or legs,  [ ]  numbness in arms or legs,  [ ]  difficulty speaking or slurred speech,  [ ]  temporary loss of vision in one eye,  [ ]  dizziness  HEMATOLOGIC:   [ ]  bleeding problems,  [ ]  problems with blood clotting too easily  MUSCULOSKEL:   [ ]  joint pain, [ ]  joint swelling  GASTROINTEST:   [ ]  vomiting blood,  [ ]  blood in stool     GENITOURINARY:   [ ]  burning with urination,  [ ]  blood in urine [x]  ESRD-HD: T/R/S  PSYCHIATRIC:   [ ]  history of major depression  INTEGUMENTARY:   [ ]  rashes,  [ ]  ulcers  CONSTITUTIONAL:   [ ]  fever,  [ ]  chills    For VQI Use Only  PRE-ADM LIVING: Home  AMB STATUS: Ambulatory  Physical Examination Filed Vitals:   09/23/15 1607  BP: 120/64  Pulse: 72  Temp: 98.4 F (36.9 C)  Resp: 24   Pulmonary: Sym exp, good air movt, CTAB, no rales, rhonchi, & wheezing  Cardiac: RRR, Nl S1, S2, no Murmurs, rubs or gallops  RUE: inc are healed, skin feels warm, hand grip is 5/5, sensation in digits is intact, palpable thrill, bruit can be auscultated, ulnar palpable, essentially non-palpable thrill, weak bruit  Medical Decision Making  Jordan [name] is a 75 y.o. year old female who presents s/p R BVT PTA x 2 for failing R BVT   The patient's options include: converting the fistula to RUA AVG vs replacement of a segment of the fistula with a short segment of graft.      The patient has elected to convert the R BVT to RUA AVG.  I excise the patient's cicatrix also at the same time per her request. Risk, benefits, and alternatives to access surgery were discussed.   The patient is aware the risks include but are not limited to: bleeding, infection, steal syndrome, nerve damage, ischemic monomelic neuropathy, failure to mature, need for additional procedures, death and stroke.   The patient agrees to proceed forward with the procedure.  Thank you for allowing [place] to participate in this patient's care.  Adele Barthel, MD, FACS [facility] Office: [phone] Pager: [phone]

## 2015-10-05 NOTE — Anesthesia Preprocedure Evaluation (Addendum)
Anesthesia Evaluation  Patient identified by MRN, date of birth, ID band Patient awake    Reviewed: Allergy & Precautions, NPO status , Patient's Chart, lab work & pertinent test results  Airway Mallampati: II  TM Distance: >3 FB Neck ROM: Full    Dental no notable dental hx. (+) Partial Upper, Partial Lower, Poor Dentition, Chipped, Missing, Dental Advisory Given,    Pulmonary sleep apnea , COPD,    Pulmonary exam normal breath sounds clear to auscultation       Cardiovascular hypertension, + CAD, + Past MI and +CHF  negative cardio ROS Normal cardiovascular exam Rhythm:Regular Rate:Normal  2016 Echo with EF 30%, no significant valve abnormalities, follow up cath in 2016 with non obstructive CAD, normal cardiac output but pulmonary hypertension   Neuro/Psych negative neurological ROS  negative psych ROS   GI/Hepatic negative GI ROS, Neg liver ROS,   Endo/Other  diabetesMorbid obesity  Renal/GU ESRFRenal disease  negative genitourinary   Musculoskeletal negative musculoskeletal ROS (+)   Abdominal   Peds negative pediatric ROS (+)  Hematology negative hematology ROS (+)   Anesthesia Other Findings   Reproductive/Obstetrics negative OB ROS                           Anesthesia Physical  Anesthesia Plan  ASA: III  Anesthesia Plan: General   Post-op Pain Management:    Induction: Intravenous  Airway Management Planned: Oral ETT  Additional Equipment:   Intra-op Plan:   Post-operative Plan: Extubation in OR  Informed Consent: I have reviewed the patients History and Physical, chart, labs and discussed the procedure including the risks, benefits and alternatives for the proposed anesthesia with the patient or authorized representative who has indicated his/her understanding and acceptance.   Dental advisory given  Plan Discussed with: CRNA and Surgeon  Anesthesia Plan Comments:          Anesthesia Quick Evaluation

## 2015-10-05 NOTE — Discharge Instructions (Signed)
° ° °  10/05/2015 Jordan [name] [id number] April 19, 1940  Surgeon(s): Conrad H[place]on[place], MD  Procedure(s): INSERTION OF  ARTERIOVENOUS (AV) GORE-TEX GRAFT RIGHT ARM LIGATION OF RIGHT BASILIC VEIN TRANSPOSITION; EXCISION OF CICATRIX  x Do not stick graft for 4 weeks

## 2015-10-05 NOTE — Progress Notes (Signed)
Pt restricted access bilateral arms, states blood is usually drawn from dialysis catheter.  CRNA informed of need for IStat 4 with access.

## 2015-10-05 NOTE — Anesthesia Procedure Notes (Signed)
Procedure Name: Intubation Date/Time: 10/05/2015 8:37 AM Performed by: Barrington Ellison Pre-anesthesia Checklist: Patient identified, Emergency Drugs available, Suction available, Patient being monitored and Timeout performed Patient Re-evaluated:Patient Re-evaluated prior to inductionOxygen Delivery Method: Circle system utilized Preoxygenation: Pre-oxygenation with 100% oxygen Intubation Type: IV induction Ventilation: Mask ventilation without difficulty Laryngoscope Size: Mac and 3 Grade View: Grade I Tube type: Oral Tube size: 7.5 mm Number of attempts: 1 Airway Equipment and Method: Stylet Placement Confirmation: ETT inserted through vocal cords under direct vision,  positive ETCO2 and breath sounds checked- equal and bilateral Secured at: 21 cm Tube secured with: Tape Dental Injury: Teeth and Oropharynx as per pre-operative assessment

## 2015-10-05 NOTE — Transfer of Care (Signed)
Immediate Anesthesia Transfer of Care Note  Patient: Jordan [name]  Procedure(s) Performed: Procedure(s): INSERTION OF  ARTERIOVENOUS (AV) GORE-TEX GRAFT RIGHT ARM (Right) LIGATION OF RIGHT BASILIC VEIN TRANSPOSITION; EXCISION OF CICATRIX (Right)  Patient Location: PACU  Anesthesia Type:General  Level of Consciousness: lethargic and responds to stimulation  Airway & Oxygen Therapy: Patient Spontanous Breathing and Patient connected to nasal cannula oxygen  Post-op Assessment: Report given to RN  Post vital signs: Reviewed and stable  Last Vitals:  Filed Vitals:   10/05/15 0733  BP: 133/52  Pulse: 65  Temp: 37.1 C  Resp: 20    Last Pain: There were no vitals filed for this visit.    Patients Stated Pain Goal: 7 ([id number])  Complications: No apparent anesthesia complications

## 2015-10-06 ENCOUNTER — Encounter (HOSPITAL_COMMUNITY): Payer: Self-pay | Admitting: Vascular Surgery

## 2015-10-07 LAB — POCT I-STAT 4, (NA,K, GLUC, HGB,HCT)
GLUCOSE: 173 mg/dL — AB (ref 65–99)
HCT: 34 % — ABNORMAL LOW (ref 36.0–46.0)
Hemoglobin: 11.6 g/dL — ABNORMAL LOW (ref 12.0–15.0)
Potassium: 4.6 mmol/L (ref 3.5–5.1)
Sodium: 138 mmol/L (ref 135–145)

## 2015-10-20 ENCOUNTER — Telehealth: Payer: Self-pay | Admitting: Vascular Surgery

## 2015-10-20 NOTE — Telephone Encounter (Signed)
Sched appt 7/14 at 11:00. Lm on hm# to inform pt.

## 2015-10-20 NOTE — Telephone Encounter (Signed)
-----   Message from Mena Goes, RN sent at 10/05/2015 12:02 PM EDT ----- Regarding: schedule   ----- Message -----    From: Alvia Grove, PA-C    Sent: 10/05/2015  11:27 AM      To: [facility] Charge Pool  S/p ligation right BVT, insertion right AVG, excision cicatrix 10/05/15  F/u in 4 weeks with Dr. Bridgett Larsson. No studies.  Thanks Maudie Mercury

## 2015-10-25 ENCOUNTER — Encounter: Payer: Self-pay | Admitting: Vascular Surgery

## 2015-10-28 ENCOUNTER — Ambulatory Visit (INDEPENDENT_AMBULATORY_CARE_PROVIDER_SITE_OTHER): Payer: Medicare (Managed Care) | Admitting: Vascular Surgery

## 2015-10-28 ENCOUNTER — Encounter: Payer: Self-pay | Admitting: Vascular Surgery

## 2015-10-28 VITALS — BP 138/65 | HR 89 | Temp 97.2°F | Resp 20 | Ht 62.5 in | Wt 284.0 lb

## 2015-10-28 DIAGNOSIS — N186 End stage renal disease: Secondary | ICD-10-CM

## 2015-10-28 DIAGNOSIS — Z992 Dependence on renal dialysis: Secondary | ICD-10-CM

## 2015-10-28 DIAGNOSIS — Z48812 Encounter for surgical aftercare following surgery on the circulatory system: Secondary | ICD-10-CM

## 2015-10-28 NOTE — Progress Notes (Signed)
    Postoperative Access Visit   History of Present Illness  Jordan [name] is a 75 y.o. year old female who presents for postoperative follow-up for: Ligation of R BVT, R UA AVG, excision of cicatrix (Date: 10/05/15).  The patient's wounds are healed.  The patient notes no steal symptoms.  The patient is able to complete their activities of daily living.  The patient's current symptoms are: few non-healed segments of incisions.  For VQI Use Only  PRE-ADM LIVING: Home  AMB STATUS: Ambulatory  Physical Examination Filed Vitals:   10/28/15 1112  BP: 138/65  Pulse: 89  Temp: 97.2 F (36.2 C)  Resp: 20    RUE: Incisions are mostly healed: few scab in cicatrix excision, arterial exposure incision healed, axillary incision partially separated with clean granulation tissue evident, , skin feels warm, hand grip is 5/5, sensation in digits is intact, palpable thrill, bruit can be auscultated   Medical Decision Making  Jordan [name] is a 75 y.o. year old female who presents s/p Ligation of R BVT, R UA AVG, excision of cicatrix    The patient has a few areas of the incisions remaining to heal.    Would wait 2 weeks and re-evaluate her R arm.  I gave her wound care instructions to manage the left axillary incisions and the scabs which are healing already.  Thank you for allowing [place] to participate in this patient's care.   Adele Barthel, MD, FACS [facility] Office: [phone] Pager: [phone]

## 2015-11-17 ENCOUNTER — Ambulatory Visit: Payer: Medicare (Managed Care) | Admitting: Vascular Surgery

## 2015-11-17 ENCOUNTER — Encounter: Payer: Self-pay | Admitting: Vascular Surgery

## 2015-11-24 NOTE — Progress Notes (Deleted)
    Postoperative Access Visit   History of Present Illness  Jaime Vanwieren Reed-Grier is a 75 y.o. year old female who presents for postoperative follow-up for: Ligation of R BVT, R UA AVG, excision of cicatrix (Date: 10/05/15).  The patient's wounds are *** healed.  The patient notes *** steal symptoms.  The patient is *** able to complete their activities of daily living.  The patient's current symptoms are: ***.  For VQI Use Only  PRE-ADM LIVING: {VQI Pre-admission Living:20973}  AMB STATUS: {VQI Ambulatory Status:20974}  Physical Examination There were no vitals filed for this visit.  RUE: Incision is *** healed, skin feels ***, hand grip is ***/5, sensation in digits is *** intact, ***palpable thrill, bruit can *** be auscultated   Medical Decision Making  Israa Stockmann Reed-Grier is a 75 y.o. year old female who presents s/p Ligation of R BVT, R UA AVG, excision of cicatrix.   The patient's access is *** ready for use.  The patient's tunneled dialysis catheter can be removed after two successful cannulations and completed dialysis treatments.  Thank you for allowing [place] to participate in this patient's care.  Adele Barthel, MD, FACS [facility] Office: [phone] Pager: [phone]

## 2015-11-25 ENCOUNTER — Ambulatory Visit: Payer: Medicare (Managed Care) | Admitting: Vascular Surgery

## 2015-12-20 NOTE — Progress Notes (Deleted)
    Postoperative Access Visit   History of Present Illness  Jordan [name] is a 75 y.o. year old female who presents for postoperative follow-up for: Ligation of R BVT, RUA AVG, excision of cicatrix (Date: 10/28/15).  Pt returns for wound check prior to giving the ok to use the RUA AVG.  Her wounds are: ***.  For VQI Use Only  PRE-ADM LIVING: Home  AMB STATUS: Ambulatory  Physical Examination There were no vitals filed for this visit.  RUE: Incision is *** healed, skin feels ***, hand grip is ***/5, sensation in digits is *** intact, ***palpable thrill, bruit can *** be auscultated   Medical Decision Making  Jordan [name] is a 75 y.o. year old female who presents s/p Ligation of R BVT, RUA AVG, excision of cicatrix  .  The patient's access is *** ready for use.  The patient's tunneled dialysis catheter can be removed after two successful cannulations and completed dialysis treatments.  Thank you for allowing [place] to participate in this patient's care.  Adele Barthel, MD, FACS [facility] Office: [phone] Pager: [phone]

## 2015-12-22 ENCOUNTER — Encounter: Payer: Self-pay | Admitting: Vascular Surgery

## 2015-12-23 ENCOUNTER — Ambulatory Visit: Payer: Medicare (Managed Care) | Admitting: Vascular Surgery

## 2016-01-02 ENCOUNTER — Encounter: Payer: Self-pay | Admitting: Cardiology

## 2016-01-06 ENCOUNTER — Ambulatory Visit: Payer: Medicare (Managed Care) | Admitting: Family

## 2016-01-12 NOTE — Progress Notes (Signed)
Cardiology Office Note   Date:  01/13/2016   ID:  Levy Wellman [name], DOB 12/20/1940, MRN [id number]  PCP:  [name], MD  Cardiologist:  Estellar Cadena Martinique, MD   Chief Complaint  Patient presents with  . Follow-up    6 MONTHS  . Edema    FEET  . Congestive Heart Failure      History of Present Illness: Jordan [name] is a 75 y.o. female who presents for follow up of CHF and pulmonary HTN. She has a history of ESRD on dialysis, HTN, COPD, DM, and breast CA s/p chemo and RT. She was admitted in June 2016 after an episode of unresponsiveness. Echo showed an EF of 30% with pulmonary HTN. She underwent cardiac cath. This demonstrated severe pulmonary HTN with elevated filling pressures and normal cardiac output. No significant CAD.  She has had multiple procedures  for vascular access. Last in June 2017.  In September 2016 she had bacteremia. TEE was done showing no vegetations. EF was 50-55%.   On follow up today she reports her breathing is better. She is tolerating dialysis much better. Rarely has hypotension. Weight is down 11 lbs. She is now following a Vegan diet. Continues sodium restriction.  No palpitations. She has had difficulty with vascular access.    Past Medical History:  Diagnosis Date  . Arthritis   . Breast cancer ([place])   . CKD (chronic kidney disease)   . COPD (chronic obstructive pulmonary disease) ([place])   . Diabetes mellitus with ESRD (end-stage renal disease) ([place])   . GERD (gastroesophageal reflux disease)   . Hypertension   . Renal disorder    Family reports acute renal failure  . Sleep apnea    does not wear CPAP  . Systolic CHF, chronic ([place]) 12/23/2014    Past Surgical History:  Procedure Laterality Date  . ARTERIOVENOUS GRAFT PLACEMENT Right 10/05/15  . AV FISTULA PLACEMENT Right 10/26/2014   Procedure: Right Brachiocephalic Arteriovenous FISTULA CREATION;  Surgeon: Conrad [place], MD;  Location: [facility];  Service: Vascular;  Laterality:  Right;  . AV FISTULA PLACEMENT Right 10/05/2015   Procedure: INSERTION OF  ARTERIOVENOUS (AV) GORE-TEX GRAFT RIGHT ARM;  Surgeon: Conrad [place], MD;  Location: [facility];  Service: Vascular;  Laterality: Right;  . BASCILIC VEIN TRANSPOSITION Right 04/13/2015   Procedure: RIGHT FIRST STAGE BASCILIC VEIN TRANSPOSITION;  Surgeon: Conrad [place], MD;  Location: [facility];  Service: Vascular;  Laterality: Right;  . BASCILIC VEIN TRANSPOSITION Right 07/11/2015   Procedure: SECOND STAGE BASILIC VEIN TRANSPOSITION;  Surgeon: Conrad [place], MD;  Location: [facility];  Service: Vascular;  Laterality: Right;  . BREAST SURGERY Left   . CARDIAC CATHETERIZATION [name] 10/19/2014   Procedure: Right/Left Heart Cath and Coronary Angiography;  Surgeon: Rudine Rieger M Martinique, MD;  Location: [facility];  Service: Cardiovascular;  Laterality: [name];  . FISTULA SUPERFICIALIZATION Right 01/26/2015   Procedure: Right BRACHIOCEPHALIC ARTERIOVENOUS FISTULA SUPERFICIALIZATION with side branch ligation;  Surgeon: Conrad [place], MD;  Location: [facility];  Service: Vascular;  Laterality: Right;  . LIGATION OF ARTERIOVENOUS  FISTULA Right 03/07/2015   Procedure: LIGATION OF RIGHT BRACHIOCEPHALIC ARTERIOVENOUS  FISTULA;  Surgeon: Conrad [place], MD;  Location: [facility];  Service: Vascular;  Laterality: Right;  . LIGATION OF ARTERIOVENOUS  FISTULA Right 10/05/2015   Procedure: LIGATION OF RIGHT BASILIC VEIN TRANSPOSITION; EXCISION OF CICATRIX;  Surgeon: Conrad , MD;  Location: [facility];  Service: Vascular;  Laterality:  Right;  Marland Kitchen PERIPHERAL VASCULAR CATHETERIZATION [name] 01/24/2015   Procedure: Fistulagram;  Surgeon: Conrad [place], MD;  Location: [facility];  Service: Cardiovascular;  Laterality: [name];  . PERIPHERAL VASCULAR CATHETERIZATION [name] 08/29/2015   Procedure: Fistulagram;  Surgeon: Conrad [place], MD;  Location: [facility];  Service: Cardiovascular;  Laterality: [name];  . PERIPHERAL VASCULAR CATHETERIZATION Right 08/29/2015   Procedure: Peripheral  Vascular Balloon Angioplasty;  Surgeon: Conrad [place], MD;  Location: [facility];  Service: Cardiovascular;  Laterality: Right;  arm fistula  . TEE WITHOUT CARDIOVERSION [name] 12/27/2014   Procedure: TRANSESOPHAGEAL ECHOCARDIOGRAM (TEE);  Surgeon: Thayer Headings, MD;  Location: [facility] [place];  Service: Cardiovascular;  Laterality: [name];  . THROMBECTOMY W/ EMBOLECTOMY Right 03/07/2015   Procedure: THROMBECTOMY OF RIGHT BRACHIOCEPHALIC ARTERIOVENOUS FISTULA;  Surgeon: Conrad [place], MD;  Location: [facility];  Service: Vascular;  Laterality: Right;     Current Outpatient Prescriptions  Medication Sig Dispense Refill  . albuterol (PROVENTIL HFA;VENTOLIN HFA) 108 (90 BASE) MCG/ACT inhaler Inhale 1-2 puffs into the lungs every 6 (six) hours as needed for wheezing or shortness of breath. Reported on 10/28/2015    . albuterol-ipratropium (COMBIVENT) 18-103 MCG/ACT inhaler Inhale 1 puff into the lungs daily as needed for wheezing or shortness of breath. Reported on 10/28/2015    . aspirin 81 MG tablet Take 81 mg by mouth daily.    . calcium carbonate (TUMS EX) 750 MG chewable tablet Chew 750 mg by mouth 3 (three) times daily.     . carvedilol (COREG) 6.25 MG tablet Take 1 tablet (6.25 mg total) by mouth 2 (two) times daily with a meal. (Patient taking differently: Take 6.25 mg by mouth every Monday,Wednesday,Friday, and Sunday at 6 PM. ) 60 tablet 1  . fluticasone (FLONASE) 50 MCG/ACT nasal spray Place 1 spray into both nostrils daily as needed for allergies. Reported on 10/28/2015    . gabapentin (NEURONTIN) 300 MG capsule Take 1 capsule (300 mg total) by mouth at bedtime. 60 capsule 6  . Insulin Detemir (LEVEMIR FLEXPEN) 100 UNIT/ML Pen Inject 55 Units into the skin every morning. (Patient taking differently: Inject 55 Units into the skin every Monday,Wednesday,Friday, and Sunday at 6 PM. ) 15 mL 11  . insulin lispro (HUMALOG KWIKPEN) 100 UNIT/ML KiwkPen Inject 1-5 Units into the skin 3 (three) times daily as  needed (if blood sugar is >150).     . Multiple Vitamins-Minerals (CENTRUM SILVER ADULT 50+ PO) Take 1 tablet by mouth every Monday,Wednesday,Friday, and Sunday at 6 PM.     . oxyCODONE (OXY IR/ROXICODONE) 5 MG immediate release tablet Take 1 tablet (5 mg total) by mouth every 8 (eight) hours as needed for moderate pain. 30 tablet 0  . traMADol (ULTRAM) 50 MG tablet Take 1 tablet (50 mg total) by mouth every 6 (six) hours as needed. (Patient taking differently: Take 50 mg by mouth every 6 (six) hours as needed (For pain.). ) 30 tablet 0   No current facility-administered medications for this visit.     Allergies:   Vicodin [hydrocodone-acetaminophen] and Adhesive [tape]    Social History:  The patient  reports that she has never smoked. She has never used smokeless tobacco. She reports that she does not drink alcohol or use drugs.   Family History:  The patient's family history includes Diabetes in her mother; Hypertension in her mother and sister; Stroke in her sister.    ROS:  Please see the history of present  illness.   Otherwise, review of systems are positive for none.   All other systems are reviewed and negative.    PHYSICAL EXAM: VS:  BP 130/60   Pulse 84   Ht 5\' 5"  (1.651 m)   Wt 273 lb (123.8 kg)   BMI 45.43 kg/m  , BMI Body mass index is 45.43 kg/m. GEN: morbidly obese BF in NAD, in no acute distress  HEENT: normal  Neck: no JVD, carotid bruits, or masses Cardiac: RRR; no murmurs, rubs, or gallops,no edema  Chest: Vas cath access right subclavian area Respiratory:  clear to auscultation bilaterally, normal work of breathing GI: soft, nontender, nondistended, + BS MS: no deformity or atrophy, chronic lymphedema left arm.  Skin: warm and dry, no rash Neuro:  Strength and sensation are intact Psych: euthymic mood, full affect   EKG:  EKG is ordered today. The ekg ordered today demonstrates NSR with low voltage. Otherwise normal. Compared to prior [place] LBBB has  resolved.   Recent Labs: 08/29/2015: BUN 45; Creatinine, Ser 8.60 10/05/2015: Hemoglobin 11.6; Potassium 4.6; Sodium 138    Lipid Panel    Component Value Date/Time   CHOL 199 10/12/2014 0745   TRIG 113 10/12/2014 0745   HDL 53 10/12/2014 0745   CHOLHDL 3.8 10/12/2014 0745   VLDL 23 10/12/2014 0745   LDLCALC 123 (H) 10/12/2014 0745      Wt Readings from Last 3 Encounters:  01/13/16 273 lb (123.8 kg)  10/28/15 284 lb (128.8 kg)  10/05/15 280 lb (127 kg)      Other studies Reviewed: Additional studies/ records that were reviewed today include: none. Review of the above records demonstrates: [name]   ASSESSMENT AND PLAN:  1.  Chronic diastolic CHF- exacerbated by ESRD. I personally reviewed her prior TTE and TEE studies. I believe the TEE is accurate as far as her EF is concerned. The TTE was very limited. Volume status maintained with dialysis. Reinforced importance of sodium restriction.  2. Severe pulmonary HTN  3. ESRD on HD.   4. DM on insulin.   5. HTN. Controlled.   6. Morbid obesity.  7. LBBB resolved.    Current medicines are reviewed at length with the patient today.  The patient does not have concerns regarding medicines.  The following changes have been made:  See above.  Labs/ tests ordered today include: none   Orders Placed This Encounter  Procedures  . EKG 12-Lead     Disposition:   FU with Dr. Martinique in one year  Signed, Jelisa Strawn Martinique, MD  01/13/2016 11:33 AM    [facility] [address] [place], [place], [address] Phone [phone], Fax [phone]

## 2016-01-13 ENCOUNTER — Encounter: Payer: Self-pay | Admitting: Cardiology

## 2016-01-13 ENCOUNTER — Ambulatory Visit (INDEPENDENT_AMBULATORY_CARE_PROVIDER_SITE_OTHER): Payer: Medicare (Managed Care) | Admitting: Cardiology

## 2016-01-13 VITALS — BP 130/60 | HR 84 | Ht 65.0 in | Wt 273.0 lb

## 2016-01-13 DIAGNOSIS — I272 Other secondary pulmonary hypertension: Secondary | ICD-10-CM | POA: Diagnosis not present

## 2016-01-13 DIAGNOSIS — I5032 Chronic diastolic (congestive) heart failure: Secondary | ICD-10-CM

## 2016-01-13 DIAGNOSIS — I10 Essential (primary) hypertension: Secondary | ICD-10-CM

## 2016-01-13 DIAGNOSIS — N186 End stage renal disease: Secondary | ICD-10-CM

## 2016-01-13 DIAGNOSIS — Z992 Dependence on renal dialysis: Secondary | ICD-10-CM

## 2016-01-13 NOTE — Patient Instructions (Signed)
Continue your current therapy  I will see you in one year   

## 2016-01-15 NOTE — Progress Notes (Signed)
    Postoperative Access Visit   History of Present Illness  Jordan [name] is a 75 y.o. (Jan 19, 1941) female who presents for postoperative follow-up for: Ligation of R BVT, R UA AVG, excision of cicatrix (Date: 10/05/15).  The patient's wounds are healed.  The patient notes no steal symptoms.  The patient is able to complete their activities of daily living.  The patient's current symptoms are: none.   For VQI Use Only  PRE-ADM LIVING: Home  AMB STATUS: Ambulatory   Physical Examination  Vitals:   01/20/16 0926  BP: (!) 120/57  Pulse: 77  Resp: 20  Temp: 98.1 F (36.7 C)  TempSrc: Oral  SpO2: 96%  Weight: 271 lb (122.9 kg)  Height: 5\' 5"  (1.651 m)     RUE: inc healed, skin feels warm, hand grip is 5/5, sensation in digits is intact, palpable thrill, bruit can be auscultated   Medical Decision Making  Jordan [name] is a 75 y.o. (1940-08-21) female  who presents s/p Ligation of R BVT, R UA AVG, excision of cicatrix    Ok to start using RUA AVG.  TDC can be removed after two successful cannulations.  Thank you for allowing [place] to participate in this patient's care.   Adele Barthel, MD, FACS [facility] Office: [phone] Pager: [phone]

## 2016-01-17 ENCOUNTER — Encounter: Payer: Self-pay | Admitting: Vascular Surgery

## 2016-01-20 ENCOUNTER — Ambulatory Visit (INDEPENDENT_AMBULATORY_CARE_PROVIDER_SITE_OTHER): Payer: Medicare (Managed Care) | Admitting: Vascular Surgery

## 2016-01-20 ENCOUNTER — Encounter: Payer: Self-pay | Admitting: Vascular Surgery

## 2016-01-20 VITALS — BP 120/57 | HR 77 | Temp 98.1°F | Resp 20 | Ht 65.0 in | Wt 271.0 lb

## 2016-01-20 DIAGNOSIS — N186 End stage renal disease: Secondary | ICD-10-CM

## 2016-01-20 DIAGNOSIS — Z992 Dependence on renal dialysis: Secondary | ICD-10-CM

## 2016-01-26 ENCOUNTER — Encounter (HOSPITAL_COMMUNITY): Payer: Self-pay | Admitting: *Deleted

## 2016-01-26 ENCOUNTER — Other Ambulatory Visit: Payer: Self-pay

## 2016-01-26 NOTE — Progress Notes (Signed)
Called patient to discuss instructions about surgery tomorrow.  Patient was unaware of surgery that is planned for tomorrow.  She stated that no one has contacted her about the planned surgery.  She was also concerned about Dr. Scot Dock doing her surgery because she did not know him.  She stated that Dr. Bridgett Larsson normally does her surgeries.  I called Stephanie at office to discuss.  I also instructed patient to call office to address her concerns.

## 2016-01-27 ENCOUNTER — Ambulatory Visit (HOSPITAL_COMMUNITY): Payer: Medicare (Managed Care) | Admitting: Certified Registered Nurse Anesthetist

## 2016-01-27 ENCOUNTER — Ambulatory Visit (HOSPITAL_COMMUNITY)
Admission: RE | Admit: 2016-01-27 | Discharge: 2016-01-27 | Disposition: A | Discharge status: Home / Self Care | Payer: Medicare (Managed Care) | Source: Ambulatory Visit | Attending: Vascular Surgery | Admitting: Vascular Surgery

## 2016-01-27 ENCOUNTER — Encounter (HOSPITAL_COMMUNITY): Payer: Self-pay | Admitting: General Practice

## 2016-01-27 ENCOUNTER — Encounter (HOSPITAL_COMMUNITY)
Admission: RE | Disposition: A | Discharge status: Home / Self Care | Payer: Self-pay | Source: Ambulatory Visit | Attending: Vascular Surgery

## 2016-01-27 DIAGNOSIS — J449 Chronic obstructive pulmonary disease, unspecified: Secondary | ICD-10-CM | POA: Insufficient documentation

## 2016-01-27 DIAGNOSIS — T82898A Other specified complication of vascular prosthetic devices, implants and grafts, initial encounter: Secondary | ICD-10-CM | POA: Insufficient documentation

## 2016-01-27 DIAGNOSIS — N186 End stage renal disease: Secondary | ICD-10-CM | POA: Diagnosis not present

## 2016-01-27 DIAGNOSIS — G4730 Sleep apnea, unspecified: Secondary | ICD-10-CM | POA: Diagnosis not present

## 2016-01-27 DIAGNOSIS — I252 Old myocardial infarction: Secondary | ICD-10-CM | POA: Diagnosis not present

## 2016-01-27 DIAGNOSIS — Z6841 Body Mass Index (BMI) 40.0 and over, adult: Secondary | ICD-10-CM | POA: Insufficient documentation

## 2016-01-27 DIAGNOSIS — I132 Hypertensive heart and chronic kidney disease with heart failure and with stage 5 chronic kidney disease, or end stage renal disease: Secondary | ICD-10-CM | POA: Diagnosis not present

## 2016-01-27 DIAGNOSIS — I509 Heart failure, unspecified: Secondary | ICD-10-CM | POA: Diagnosis not present

## 2016-01-27 DIAGNOSIS — Y832 Surgical operation with anastomosis, bypass or graft as the cause of abnormal reaction of the patient, or of later complication, without mention of misadventure at the time of the procedure: Secondary | ICD-10-CM | POA: Insufficient documentation

## 2016-01-27 DIAGNOSIS — T82868A Thrombosis of vascular prosthetic devices, implants and grafts, initial encounter: Secondary | ICD-10-CM | POA: Diagnosis not present

## 2016-01-27 HISTORY — PX: THROMBECTOMY W/ EMBOLECTOMY: SHX2507

## 2016-01-27 LAB — POCT I-STAT 4, (NA,K, GLUC, HGB,HCT)
Glucose, Bld: 125 mg/dL — ABNORMAL HIGH (ref 65–99)
HCT: 36 % (ref 36.0–46.0)
HEMOGLOBIN: 12.2 g/dL (ref 12.0–15.0)
POTASSIUM: 4.6 mmol/L (ref 3.5–5.1)
SODIUM: 137 mmol/L (ref 135–145)

## 2016-01-27 LAB — GLUCOSE, CAPILLARY
GLUCOSE-CAPILLARY: 138 mg/dL — AB (ref 65–99)
Glucose-Capillary: 150 mg/dL — ABNORMAL HIGH (ref 65–99)

## 2016-01-27 SURGERY — THROMBECTOMY ARTERIOVENOUS GORE-TEX GRAFT
Anesthesia: General | Site: Arm Upper | Laterality: Right

## 2016-01-27 MED ORDER — SUCCINYLCHOLINE CHLORIDE 200 MG/10ML IV SOSY
PREFILLED_SYRINGE | INTRAVENOUS | Status: DC | PRN
  Administered 2016-01-27: 100 mg via INTRAVENOUS

## 2016-01-27 MED ORDER — CHLORHEXIDINE GLUCONATE CLOTH 2 % EX PADS: 6.0000 | MEDICATED_PAD | Freq: Once | CUTANEOUS | Status: DC

## 2016-01-27 MED ORDER — PROPOFOL 10 MG/ML IV BOLUS
INTRAVENOUS | Status: AC
  Filled 2016-01-27: qty 20

## 2016-01-27 MED ORDER — FENTANYL CITRATE (PF) 250 MCG/5ML IJ SOLN
INTRAMUSCULAR | Status: AC
  Filled 2016-01-27: qty 5

## 2016-01-27 MED ORDER — FENTANYL CITRATE (PF) 100 MCG/2ML IJ SOLN
INTRAMUSCULAR | Status: DC | PRN
  Administered 2016-01-27 (×2): 50 ug via INTRAVENOUS

## 2016-01-27 MED ORDER — FENTANYL CITRATE (PF) 100 MCG/2ML IJ SOLN: 25.0000 ug | INTRAMUSCULAR | Status: DC | PRN

## 2016-01-27 MED ORDER — PROTAMINE SULFATE 10 MG/ML IV SOLN
INTRAVENOUS | Status: DC | PRN
  Administered 2016-01-27: 40 mg via INTRAVENOUS
  Administered 2016-01-27: 10 mg via INTRAVENOUS

## 2016-01-27 MED ORDER — 0.9 % SODIUM CHLORIDE (POUR BTL) OPTIME
TOPICAL | Status: DC | PRN
  Administered 2016-01-27: 1000 mL

## 2016-01-27 MED ORDER — SODIUM CHLORIDE 0.9 % IV SOLN
INTRAVENOUS | Status: DC
  Administered 2016-01-27 (×3): via INTRAVENOUS

## 2016-01-27 MED ORDER — SODIUM CHLORIDE 0.9 % IV SOLN
INTRAVENOUS | Status: DC | PRN
  Administered 2016-01-27: 500 mL

## 2016-01-27 MED ORDER — PHENYLEPHRINE 40 MCG/ML (10ML) SYRINGE FOR IV PUSH (FOR BLOOD PRESSURE SUPPORT)
PREFILLED_SYRINGE | INTRAVENOUS | Status: DC | PRN
Start: 2016-01-27 — End: 2016-01-27
  Administered 2016-01-27: 80 ug via INTRAVENOUS

## 2016-01-27 MED ORDER — LIDOCAINE 2% (20 MG/ML) 5 ML SYRINGE
INTRAMUSCULAR | Status: AC
  Filled 2016-01-27: qty 5

## 2016-01-27 MED ORDER — THROMBIN 20000 UNITS EX SOLR
CUTANEOUS | Status: AC
  Filled 2016-01-27: qty 20000

## 2016-01-27 MED ORDER — SUCCINYLCHOLINE CHLORIDE 200 MG/10ML IV SOSY
PREFILLED_SYRINGE | INTRAVENOUS | Status: AC
  Filled 2016-01-27: qty 10

## 2016-01-27 MED ORDER — PHENYLEPHRINE HCL 10 MG/ML IJ SOLN
INTRAMUSCULAR | Status: DC | PRN
  Administered 2016-01-27: 20 ug/min via INTRAVENOUS

## 2016-01-27 MED ORDER — DEXTROSE 5 % IV SOLN
1.5000 g | INTRAVENOUS | Status: AC
  Administered 2016-01-27: 1.5 g via INTRAVENOUS

## 2016-01-27 MED ORDER — OXYCODONE HCL 5 MG PO TABS: 5.0000 mg | ORAL_TABLET | Freq: Three times a day (TID) | ORAL | 0 refills | Status: DC | PRN

## 2016-01-27 MED ORDER — INSULIN DETEMIR 100 UNIT/ML FLEXPEN: 55.0000 [IU] | PEN_INJECTOR | SUBCUTANEOUS | Status: DC

## 2016-01-27 MED ORDER — EPHEDRINE SULFATE-NACL 50-0.9 MG/10ML-% IV SOSY
PREFILLED_SYRINGE | INTRAVENOUS | Status: DC | PRN
  Administered 2016-01-27 (×2): 10 mg via INTRAVENOUS

## 2016-01-27 MED ORDER — PROPOFOL 10 MG/ML IV BOLUS
INTRAVENOUS | Status: DC | PRN
  Administered 2016-01-27: 200 mg via INTRAVENOUS

## 2016-01-27 MED ORDER — PROMETHAZINE HCL 25 MG/ML IJ SOLN: 6.2500 mg | INTRAMUSCULAR | Status: DC | PRN

## 2016-01-27 MED ORDER — THROMBIN 20000 UNITS EX KIT
PACK | CUTANEOUS | Status: DC | PRN
  Administered 2016-01-27: 20 mL via TOPICAL

## 2016-01-27 MED ORDER — ONDANSETRON HCL 4 MG/2ML IJ SOLN
INTRAMUSCULAR | Status: DC | PRN
  Administered 2016-01-27: 4 mg via INTRAVENOUS

## 2016-01-27 MED ORDER — HEPARIN SODIUM (PORCINE) 1000 UNIT/ML IJ SOLN
INTRAMUSCULAR | Status: DC | PRN
  Administered 2016-01-27: 10000 [IU] via INTRAVENOUS

## 2016-01-27 MED ORDER — DEXTROSE 5 % IV SOLN
INTRAVENOUS | Status: AC
  Filled 2016-01-27: qty 1.5

## 2016-01-27 SURGICAL SUPPLY — 40 items
ARMBAND PINK RESTRICT EXTREMIT (MISCELLANEOUS) ×2 IMPLANT
CANISTER SUCTION 2500CC (MISCELLANEOUS) ×2 IMPLANT
CANNULA VESSEL 3MM 2 BLNT TIP (CANNULA) ×2 IMPLANT
CATH EMB 4FR 80CM (CATHETERS) ×2 IMPLANT
CLIP TI MEDIUM 6 (CLIP) ×2 IMPLANT
CLIP TI WIDE RED SMALL 6 (CLIP) ×2 IMPLANT
DERMABOND ADVANCED (GAUZE/BANDAGES/DRESSINGS) ×2
DERMABOND ADVANCED .7 DNX12 (GAUZE/BANDAGES/DRESSINGS) ×2 IMPLANT
DRAPE X-RAY CASS 24X20 (DRAPES) IMPLANT
ELECT REM PT RETURN 9FT ADLT (ELECTROSURGICAL) ×2
ELECTRODE REM PT RTRN 9FT ADLT (ELECTROSURGICAL) ×1 IMPLANT
GAUZE SPONGE 4X4 16PLY XRAY LF (GAUZE/BANDAGES/DRESSINGS) IMPLANT
GEL ULTRASOUND 20GR AQUASONIC (MISCELLANEOUS) IMPLANT
GLOVE BIO SURGEON STRL SZ 6.5 (GLOVE) ×4 IMPLANT
GLOVE BIO SURGEON STRL SZ7.5 (GLOVE) ×2 IMPLANT
GLOVE BIOGEL PI IND STRL 7.0 (GLOVE) ×1 IMPLANT
GLOVE BIOGEL PI IND STRL 8 (GLOVE) ×1 IMPLANT
GLOVE BIOGEL PI INDICATOR 7.0 (GLOVE) ×1
GLOVE BIOGEL PI INDICATOR 8 (GLOVE) ×1
GOWN STRL REUS W/ TWL LRG LVL3 (GOWN DISPOSABLE) ×4 IMPLANT
GOWN STRL REUS W/TWL LRG LVL3 (GOWN DISPOSABLE) ×4
GRAFT GORETEX STRT 7X10 (Vascular Products) ×2 IMPLANT
KIT BASIN OR (CUSTOM PROCEDURE TRAY) ×2 IMPLANT
KIT ROOM TURNOVER OR (KITS) ×2 IMPLANT
LIQUID BAND (GAUZE/BANDAGES/DRESSINGS) ×2 IMPLANT
NS IRRIG 1000ML POUR BTL (IV SOLUTION) ×2 IMPLANT
PACK CV ACCESS (CUSTOM PROCEDURE TRAY) ×2 IMPLANT
PAD ARMBOARD 7.5X6 YLW CONV (MISCELLANEOUS) ×4 IMPLANT
SET COLLECT BLD 21X3/4 12 (NEEDLE) IMPLANT
SPONGE LAP 18X18 X RAY DECT (DISPOSABLE) ×2 IMPLANT
SPONGE SURGIFOAM ABS GEL 100 (HEMOSTASIS) IMPLANT
STOPCOCK 4 WAY LG BORE MALE ST (IV SETS) IMPLANT
SUT PROLENE 6 0 BV (SUTURE) ×6 IMPLANT
SUT VIC AB 3-0 SH 27 (SUTURE) ×1
SUT VIC AB 3-0 SH 27X BRD (SUTURE) ×1 IMPLANT
SUT VICRYL 4-0 PS2 18IN ABS (SUTURE) ×2 IMPLANT
TOWEL OR 17X24 6PK STRL BLUE (TOWEL DISPOSABLE) ×2 IMPLANT
TUBING EXTENTION W/L.L. (IV SETS) IMPLANT
UNDERPAD 30X30 (UNDERPADS AND DIAPERS) ×2 IMPLANT
WATER STERILE IRR 1000ML POUR (IV SOLUTION) ×2 IMPLANT

## 2016-01-27 NOTE — Anesthesia Procedure Notes (Signed)
Procedure Name: Intubation Date/Time: 01/27/2016 2:41 PM Performed by: Trixie Deis A Pre-anesthesia Checklist: Patient identified, Emergency Drugs available, Suction available and Patient being monitored Patient Re-evaluated:Patient Re-evaluated prior to inductionOxygen Delivery Method: Circle System Utilized Preoxygenation: Pre-oxygenation with 100% oxygen Intubation Type: IV induction Ventilation: Mask ventilation without difficulty Laryngoscope Size: Mac and 3 Grade View: Grade I Tube type: Oral Tube size: 7.0 mm Number of attempts: 1 Airway Equipment and Method: Stylet and Oral airway Placement Confirmation: ETT inserted through vocal cords under direct vision,  positive ETCO2 and breath sounds checked- equal and bilateral Secured at: 21 cm Tube secured with: Tape Dental Injury: Teeth and Oropharynx as per pre-operative assessment  Comments: Attempt to place LMA #4 unsuccessful (poor seal, inadequate chest rise and Vt), OETT placed without issue.

## 2016-01-27 NOTE — Anesthesia Postprocedure Evaluation (Signed)
Anesthesia Post Note  Patient: Jordan [name]  Procedure(s) Performed: Procedure(s) (LRB): THROMBECTOMY AND REVISION OF RIGHT UPPER ARM ARTERIOVENOUS GORE-TEX GRAFT (Right)  Patient location during evaluation: PACU Anesthesia Type: General Level of consciousness: awake and alert Pain management: pain level controlled Vital Signs Assessment: post-procedure vital signs reviewed and stable Respiratory status: spontaneous breathing, nonlabored ventilation, respiratory function stable and patient connected to nasal cannula oxygen Cardiovascular status: blood pressure returned to baseline and stable Postop Assessment: no signs of nausea or vomiting Anesthetic complications: no    Last Vitals:  Vitals:   01/27/16 1034 01/27/16 1625  BP: (!) 156/44 (!) 133/55  Pulse: 65   Resp: 18   Temp: 36.4 C 36.2 C    Last Pain:  Vitals:   01/27/16 1625  TempSrc:   PainSc: 0-No pain                 [name]

## 2016-01-27 NOTE — Discharge Instructions (Signed)
° ° °  01/27/2016 Jordan [name] [id number] 09/21/1940  Surgeon(s): Angelia Mould, MD  Procedure(s): THROMBECTOMY AND REVISION OF RIGHT UPPER ARM ARTERIOVENOUS GORE-TEX GRAFT  x May stick graft immediately

## 2016-01-27 NOTE — H&P (View-Only) (Signed)
    Postoperative Access Visit   History of Present Illness  Celise Bazar Reed-Grier is a 75 y.o. (January 21, 1941) female who presents for postoperative follow-up for: Ligation of R BVT, R UA AVG, excision of cicatrix (Date: 10/05/15).  The patient's wounds are healed.  The patient notes no steal symptoms.  The patient is able to complete their activities of daily living.  The patient's current symptoms are: none.   For VQI Use Only  PRE-ADM LIVING: Home  AMB STATUS: Ambulatory   Physical Examination  Vitals:   01/20/16 0926  BP: (!) 120/57  Pulse: 77  Resp: 20  Temp: 98.1 F (36.7 C)  TempSrc: Oral  SpO2: 96%  Weight: 271 lb (122.9 kg)  Height: 5\' 5"  (1.651 m)     RUE: inc healed, skin feels warm, hand grip is 5/5, sensation in digits is intact, palpable thrill, bruit can be auscultated   Medical Decision Making  Dorette Hartel Reed-Grier is a 75 y.o. (06/12/1940) female  who presents s/p Ligation of R BVT, R UA AVG, excision of cicatrix    Ok to start using RUA AVG.  TDC can be removed after two successful cannulations.  Thank you for allowing [place] to participate in this patient's care.   Adele Barthel, MD, FACS [facility] Office: [phone] Pager: [phone]

## 2016-01-27 NOTE — Interval H&P Note (Signed)
History and Physical Interval Note:  01/27/2016 2:02 PM  Jordan [name]  has presented today for surgery, with the diagnosis of End Stage Renal Disease N18.6  The various methods of treatment have been discussed with the patient and family. After consideration of risks, benefits and other options for treatment, the patient has consented to  Procedure(s): THROMBECTOMY ARTERIOVENOUS GORE-TEX GRAFT (Right) as a surgical intervention .  The patient's history has been reviewed, patient examined, no change in status, stable for surgery.  I have reviewed the patient's chart and labs.  Questions were answered to the patient's satisfaction.     Deitra Mayo

## 2016-01-27 NOTE — Transfer of Care (Signed)
Immediate Anesthesia Transfer of Care Note  Patient: Jordan [name]  Procedure(s) Performed: Procedure(s): THROMBECTOMY AND REVISION OF RIGHT UPPER ARM ARTERIOVENOUS GORE-TEX GRAFT (Right)  Patient Location: PACU  Anesthesia Type:General  Level of Consciousness: sedated  Airway & Oxygen Therapy: Patient Spontanous Breathing and Patient connected to nasal cannula oxygen  Post-op Assessment: Report given to RN, Post -op Vital signs reviewed and stable and Patient moving all extremities  Post vital signs: Reviewed and stable  Last Vitals:  Vitals:   01/27/16 1034  BP: (!) 156/44  Pulse: 65  Resp: 18  Temp: 36.4 C    Last Pain:  Vitals:   01/27/16 1034  TempSrc: Oral      Patients Stated Pain Goal: 2 (54/30/14 8403)  Complications: No apparent anesthesia complications

## 2016-01-27 NOTE — Anesthesia Preprocedure Evaluation (Signed)
Anesthesia Evaluation  Patient identified by MRN, date of birth, ID band Patient awake    Reviewed: Allergy & Precautions, NPO status , Patient's Chart, lab work & pertinent test results  Airway Mallampati: II  TM Distance: >3 FB Neck ROM: Full    Dental no notable dental hx.    Pulmonary sleep apnea , COPD,    Pulmonary exam normal breath sounds clear to auscultation       Cardiovascular hypertension, + Past MI and +CHF  Normal cardiovascular exam Rhythm:Regular Rate:Normal     Neuro/Psych negative neurological ROS  negative psych ROS   GI/Hepatic negative GI ROS, Neg liver ROS,   Endo/Other  diabetesMorbid obesity  Renal/GU DialysisRenal disease  negative genitourinary   Musculoskeletal negative musculoskeletal ROS (+)   Abdominal   Peds negative pediatric ROS (+)  Hematology negative hematology ROS (+)   Anesthesia Other Findings   Reproductive/Obstetrics negative OB ROS                             Anesthesia Physical Anesthesia Plan  ASA: III  Anesthesia Plan: General   Post-op Pain Management:    Induction: Intravenous  Airway Management Planned: LMA  Additional Equipment:   Intra-op Plan:   Post-operative Plan: Extubation in OR  Informed Consent: I have reviewed the patients History and Physical, chart, labs and discussed the procedure including the risks, benefits and alternatives for the proposed anesthesia with the patient or authorized representative who has indicated his/her understanding and acceptance.   Dental advisory given  Plan Discussed with: CRNA and Surgeon  Anesthesia Plan Comments:         Anesthesia Quick Evaluation

## 2016-01-27 NOTE — Progress Notes (Signed)
While in PACU bay 6-disconnected IVF from blue port of RT IJ Orestes.   Obtained brisk 2cc blood return, flushed 10cc NS, then 1.6cc Heparin (1,000 units/ml) and capped.   Taped clamps and caps closed.   Gauze drsg D/I.

## 2016-01-27 NOTE — Op Note (Signed)
    NAME: Jordan [name]  MRN: [id number] DOB: 08-May-1940    DATE OF OPERATION: 01/27/2016  PREOP DIAGNOSIS: Clotted right upper arm AV graft  POSTOP DIAGNOSIS: Same  PROCEDURE: Thrombectomy and revision of right upper arm AV graft  SURGEON: Judeth Cornfield. Scot Dock, MD, FACS  ASSIST: Leontine Locket, PA  ANESTHESIA: Gen.   EBL: Minimal  INDICATIONS: Jordan [name] is a 74 y.o. female who had ligation of a right basilic vein transposition and placement of a new right upper arm AV graft on 10/05/2015. The graft was tunneled lateral to the basilic vein transposition.  FINDINGS: I revised the venous anastomosis and junk time on the vein. The arterial plug was clearly retrieved.  TECHNIQUE: The patient was taken to the operating room and received a general anesthetic. The right upper extremity was prepped and draped in usual sterile fashion. The previous incision was high in the axilla and I elected to make a transverse incision just below the axillary crease because of concerns for wound healing. The dissection was carried down to the anastomosis. The anastomosis was high in the axilla. The patient was heparinized. The graft and the old venous anastomosis were dissected free. The graft was divided. Graft thrombectomy was achieved using a #4 Fogarty catheter. The arterial plug was clearly retrieved. The graft was flushed with heparinized saline and clamped. I then did a venous thrombectomy and inspected the venous anastomosis. There was intimal hyperplasia present here and therefore I elected to jump higher on the vein. The more proximal vein was dissected free and the vein spatulated. The 7 x 10 graft was selected. This was spatulated and sewn end to end to the old graft using continuous 6-0 Prolene suture. The graft was then spatulated and sewn end-to-end into the more proximal vein using continuous 6-0 Prolene suture. At the completion was a good thrill in the graft. Hemostasis was  obtained in the wound. The heparin was partially reversed with protamine. The wound was closed with 2 deep layers of 3-0 Vicryl and the skin closed with 4-0 Vicryl. Dermabond was applied. Patient tolerated the procedure well and was transferred to the recovery room in stable condition. All needle and sponge counts were correct.  Deitra Mayo, MD, FACS [facility]  DATE OF DICTATION:   01/27/2016

## 2016-01-30 ENCOUNTER — Encounter (HOSPITAL_COMMUNITY): Payer: Self-pay | Admitting: Vascular Surgery

## 2016-01-30 MED FILL — Thrombin For Soln 20000 Unit: CUTANEOUS | Qty: 1 | Status: AC

## 2016-03-21 ENCOUNTER — Ambulatory Visit: Payer: Medicare (Managed Care)

## 2016-04-25 ENCOUNTER — Encounter (HOSPITAL_COMMUNITY): Payer: Medicare (Managed Care)

## 2016-04-26 ENCOUNTER — Other Ambulatory Visit (HOSPITAL_COMMUNITY): Payer: Self-pay | Admitting: Nephrology

## 2016-04-26 DIAGNOSIS — Z992 Dependence on renal dialysis: Principal | ICD-10-CM

## 2016-04-26 DIAGNOSIS — N186 End stage renal disease: Secondary | ICD-10-CM

## 2016-04-30 ENCOUNTER — Ambulatory Visit (HOSPITAL_COMMUNITY)
Admission: RE | Admit: 2016-04-30 | Discharge: 2016-04-30 | Disposition: A | Discharge status: Home / Self Care | Payer: Medicare Other | Source: Ambulatory Visit | Attending: Nephrology | Admitting: Nephrology

## 2016-04-30 ENCOUNTER — Encounter (HOSPITAL_COMMUNITY): Payer: Self-pay | Admitting: Interventional Radiology

## 2016-04-30 DIAGNOSIS — N186 End stage renal disease: Secondary | ICD-10-CM | POA: Insufficient documentation

## 2016-04-30 DIAGNOSIS — Z452 Encounter for adjustment and management of vascular access device: Secondary | ICD-10-CM | POA: Insufficient documentation

## 2016-04-30 DIAGNOSIS — Z992 Dependence on renal dialysis: Secondary | ICD-10-CM | POA: Diagnosis not present

## 2016-04-30 HISTORY — PX: IR GENERIC HISTORICAL: IMG1180011

## 2016-04-30 MED ORDER — LIDOCAINE HCL 1 % IJ SOLN
INTRAMUSCULAR | Status: DC | PRN
  Administered 2016-04-30: 10 mL

## 2016-04-30 MED ORDER — CHLORHEXIDINE GLUCONATE 4 % EX LIQD
CUTANEOUS | Status: AC
  Filled 2016-04-30: qty 15

## 2016-04-30 MED ORDER — LIDOCAINE HCL (PF) 1 % IJ SOLN
INTRAMUSCULAR | Status: AC
  Filled 2016-04-30: qty 10

## 2016-04-30 NOTE — Procedures (Signed)
Successful removal of (R)EJ tunneled HD cath originally placed 6/34/9494 No complications.  Ascencion Dike PA-C Interventional Radiology 04/30/2016 10:18 AM

## 2016-08-03 ENCOUNTER — Encounter: Payer: Self-pay | Admitting: Family Medicine

## 2016-08-03 ENCOUNTER — Ambulatory Visit (INDEPENDENT_AMBULATORY_CARE_PROVIDER_SITE_OTHER): Payer: Medicare Other | Admitting: Family Medicine

## 2016-08-03 VITALS — BP 128/64 | HR 78 | Temp 98.0°F | Wt 285.6 lb

## 2016-08-03 DIAGNOSIS — Z853 Personal history of malignant neoplasm of breast: Secondary | ICD-10-CM | POA: Diagnosis not present

## 2016-08-03 DIAGNOSIS — E1121 Type 2 diabetes mellitus with diabetic nephropathy: Secondary | ICD-10-CM

## 2016-08-03 DIAGNOSIS — R0789 Other chest pain: Secondary | ICD-10-CM | POA: Diagnosis not present

## 2016-08-03 DIAGNOSIS — Z794 Long term (current) use of insulin: Secondary | ICD-10-CM | POA: Diagnosis not present

## 2016-08-03 DIAGNOSIS — C50912 Malignant neoplasm of unspecified site of left female breast: Secondary | ICD-10-CM | POA: Diagnosis not present

## 2016-08-03 DIAGNOSIS — Z992 Dependence on renal dialysis: Secondary | ICD-10-CM

## 2016-08-03 DIAGNOSIS — N186 End stage renal disease: Secondary | ICD-10-CM

## 2016-08-03 NOTE — Assessment & Plan Note (Signed)
Continue to follow with nephrology

## 2016-08-03 NOTE — Assessment & Plan Note (Signed)
History of breast cancer. We'll get her plugged in with oncology here.

## 2016-08-03 NOTE — Patient Instructions (Signed)
Nice to see you. We will get you set up with your cardiologist. It would probably be worthwhile to have you see oncology again as well. We'll arrange for these. If you develop persistent chest pain or he develop trouble breathing, or any new or changing symptoms please seek medical attention immediately.

## 2016-08-03 NOTE — Assessment & Plan Note (Signed)
CBGs and A1c well controlled. She'll continue on her current regimen.

## 2016-08-03 NOTE — Progress Notes (Signed)
Jordan Rumps, MD Phone: [phone]  Jordan [name] is a 76 y.o. female who presents today for follow-up.  Patient notes for the last 3 or so months she's had a cramping sensation in her left chest that radiates to the center of her chest. She felt as though this is gas as it would occasionally ease off if she burped. Though it does not always resolve with burping. Notes it bothers her occasionally if she turns the wrong way and when she rolls over in bed. It does not bother her at any other time. She notes it has occurred when she has been at dialysis in the techs there have told her that they could see the cramp moving across from her left ribs to the center of her chest. It is not exertional. There is no associated shortness of breath. No diaphoresis. It does occasionally radiate up to her shoulder. Notes some nausea with it when the pain severe. She did note a slight cramp at the junction of her axilla and left chest when pushing herself up onto the exam table today. This resolved quickly with no other symptoms.  ESRD: Currently on dialysis Tuesday Thursday Saturday. Her center is in [place]. She does still urinate 1-3 times a day. She does not miss dialysis sessions. Notes her blood pressure does drop some when she is at dialysis.  Diabetes: Her last A1c was 6.4 on the paperwork that she brought in. She's taking Levemir and Humalog. No polydipsia. No hypoglycemia. Has been following in [place] with the primary care doctor as she splits time between here and there.  PMH: nonsmoker.   ROS see history of present illness  Objective  Physical Exam Vitals:   08/03/16 1458  BP: 128/64  Pulse: 78  Temp: 98 F (36.7 C)    BP Readings from Last 3 Encounters:  08/03/16 128/64  01/27/16 (!) 133/55  01/20/16 (!) 120/57   Wt Readings from Last 3 Encounters:  08/03/16 285 lb 9.6 oz (129.5 kg)  01/27/16 271 lb (122.9 kg)  01/20/16 271 lb (122.9 kg)    Physical Exam    Constitutional: No distress.  Cardiovascular: Normal rate, regular rhythm and normal heart sounds.   Pulmonary/Chest: Effort normal and breath sounds normal.  Left sided mastectomy noted with scar noted, there is no tenderness or mass palpated in the left chest wall or left lateral mid ribs  Musculoskeletal: She exhibits no edema.  Neurological: She is alert. Gait normal.  Skin: Skin is warm and dry. She is not diaphoretic.   EKG: Left bundle branch block, rate 72  Assessment/Plan: Please see individual problem list.  Atypical chest pain Patient with described cramping sensation in the left lateral chest that has been intermittent for several months. It is atypical in that it is not exertional and not associated with shortness of breath or diaphoresis. Her EKG does show a known left bundle branch block. She does have a history of breast cancer with mastectomy. Does not seem consistent with cardiac cause and that would also seem to be less likely given her 2016 catheterization that showed minimal coronary disease. Potentially could be muscular cramps. Given her history of  breast cancer could potentially be related to that as well. We will have her follow-up with her cardiologist. Burnis Medin get her set up with the oncologist here as she has not been followed by anyone in several years and her breast cancer was diagnosed in 2014. If she has persistent symptoms or new symptoms she'll be  reevaluated. Given return precautions.   Breast cancer History of breast cancer. We'll get her plugged in with oncology here.  ESRD (end stage renal disease) on dialysis [facility]) Continue to follow with nephrology.  Type 2 diabetes mellitus with diabetic nephropathy CBGs and A1c well controlled. She'll continue on her current regimen.   Orders Placed This Encounter  Procedures  . Ambulatory referral to Oncology    Referral Priority:   Routine    Referral Type:   Consultation    Referral Reason:   Specialty  Services Required    Number of Visits Requested:   1  . Ambulatory referral to Cardiology    Referral Priority:   Routine    Referral Type:   Consultation    Referral Reason:   Specialty Services Required    Requested Specialty:   Cardiology    Number of Visits Requested:   1  . EKG 12-Lead    Jordan Rumps, MD [place]

## 2016-08-03 NOTE — Assessment & Plan Note (Signed)
Patient with described cramping sensation in the left lateral chest that has been intermittent for several months. It is atypical in that it is not exertional and not associated with shortness of breath or diaphoresis. Her EKG does show a known left bundle branch block. She does have a history of breast cancer with mastectomy. Does not seem consistent with cardiac cause and that would also seem to be less likely given her 2016 catheterization that showed minimal coronary disease. Potentially could be muscular cramps. Given her history of  breast cancer could potentially be related to that as well. We will have her follow-up with her cardiologist. Burnis Medin get her set up with the oncologist here as she has not been followed by anyone in several years and her breast cancer was diagnosed in 2014. If she has persistent symptoms or new symptoms she'll be reevaluated. Given return precautions.

## 2016-08-03 NOTE — Progress Notes (Signed)
Pre visit review using our clinic review tool, if applicable. No additional management support is needed unless otherwise documented below in the visit note. 

## 2016-08-15 ENCOUNTER — Encounter: Payer: Self-pay | Admitting: Student

## 2016-08-15 ENCOUNTER — Ambulatory Visit (INDEPENDENT_AMBULATORY_CARE_PROVIDER_SITE_OTHER): Payer: Medicare Other | Admitting: Student

## 2016-08-15 VITALS — BP 120/68 | HR 80 | Ht 65.0 in | Wt 287.0 lb

## 2016-08-15 DIAGNOSIS — Z992 Dependence on renal dialysis: Secondary | ICD-10-CM

## 2016-08-15 DIAGNOSIS — I5032 Chronic diastolic (congestive) heart failure: Secondary | ICD-10-CM | POA: Diagnosis not present

## 2016-08-15 DIAGNOSIS — I10 Essential (primary) hypertension: Secondary | ICD-10-CM | POA: Diagnosis not present

## 2016-08-15 DIAGNOSIS — N186 End stage renal disease: Secondary | ICD-10-CM

## 2016-08-15 DIAGNOSIS — R0789 Other chest pain: Secondary | ICD-10-CM

## 2016-08-15 MED ORDER — PANTOPRAZOLE SODIUM 20 MG PO TBEC: 20.0000 mg | DELAYED_RELEASE_TABLET | Freq: Every day | ORAL | 1 refills | Status: DC

## 2016-08-15 NOTE — Progress Notes (Signed)
Cardiology Office Note    Date:  08/15/2016   ID:  [name], DOB 10-24-1940, MRN [id number]  PCP:  [name], MD  Cardiologist: Dr. Martinique  Chief Complaint  Patient presents with  . Follow-up    recent episodes of chest pain    History of Present Illness:    Jordan [name] is a 76 y.o. female with past medical history of chronic diastolic CHF, pulmonary HTN, mild CAD by cath in 10/2014, ESRD (HD T,Th, Sat), HTN, HLD, Type 2 DM, and remote history of breast cancer (occurring in 2014) who presents to the office today for routine follow-up.   She was last examined by Dr. Martinique in 12/2015 and reported doing well from a cardiac perspective. Volume status was much improved when compared to prior examinations and weight was at 273 lbs.   Was recently evaluated by her PCP on 08/03/2016 for a cramping sensation along her left chest that felt like gas, as it would improve with burping. Denied any exertional symptoms. EKG was obtained at that time and showed NSR, HR 72, with known LBBB.   In talking with the patient today, she reports having occasional episodes of pain along her left pectoral region which goes into her left arm pit. This commonly occurs after consuming food and lasts for 1 to 5 minutes at a time. She can take Tums with improvement in her symptoms. The pain is sometimes exacerbated by moving her left arm but is relieved with flatulence. She denies any exertional component to this. No associated dyspnea, nausea, vomiting, or diaphoresis.  She has began to experience episodes of hypotension with her dialysis. Her sessions are supposed to last up to 4 hours but she is only been able to undergo 3 hours at a time over the past several weeks. She takes Carvedilol 6.25 mg BID but holds this on her dialysis days.  Past Medical History:  Diagnosis Date  . Arthritis   . Breast cancer ([facility] [facility])   . CAD (coronary artery disease)    a. minimal CAD by cath in 10/2014.   Marland Kitchen CKD (chronic kidney disease)   . COPD (chronic obstructive pulmonary disease) ([place])   . Diabetes mellitus with ESRD (end-stage renal disease) ([place])   . GERD (gastroesophageal reflux disease)   . Hypertension   . Renal disorder    Family reports acute renal failure  . Sleep apnea    does not wear CPAP  . Systolic CHF, chronic ([place]) 12/23/2014    Past Surgical History:  Procedure Laterality Date  . ARTERIOVENOUS GRAFT PLACEMENT Right 10/05/15  . AV FISTULA PLACEMENT Right 10/26/2014   Procedure: Right Brachiocephalic Arteriovenous FISTULA CREATION;  Surgeon: Conrad Riverview, MD;  Location: [place];  Service: Vascular;  Laterality: Right;  . AV FISTULA PLACEMENT Right 10/05/2015   Procedure: INSERTION OF  ARTERIOVENOUS (AV) GORE-TEX GRAFT RIGHT ARM;  Surgeon: Conrad Grant, MD;  Location: [place];  Service: Vascular;  Laterality: Right;  . BASCILIC VEIN TRANSPOSITION Right 04/13/2015   Procedure: RIGHT FIRST STAGE BASCILIC VEIN TRANSPOSITION;  Surgeon: Conrad Bellview, MD;  Location: [place];  Service: Vascular;  Laterality: Right;  . BASCILIC VEIN TRANSPOSITION Right 07/11/2015   Procedure: SECOND STAGE BASILIC VEIN TRANSPOSITION;  Surgeon: Conrad Taylor, MD;  Location: [place];  Service: Vascular;  Laterality: Right;  . BREAST SURGERY Left   . CARDIAC CATHETERIZATION [name] 10/19/2014   Procedure: Right/Left Heart Cath and Coronary Angiography;  Surgeon: Peter M Martinique, MD;  Location: [place] CV [facility];  Service: Cardiovascular;  Laterality: [name];  . FISTULA SUPERFICIALIZATION Right 01/26/2015   Procedure: Right BRACHIOCEPHALIC ARTERIOVENOUS FISTULA SUPERFICIALIZATION with side branch ligation;  Surgeon: Conrad Riverdale, MD;  Location: [place];  Service: Vascular;  Laterality: Right;  . IR GENERIC HISTORICAL  04/30/2016   IR REMOVAL TUN CV CATH W/O FL 04/30/2016 Aletta Edouard, MD [facility]-INTERV RAD  . LIGATION OF ARTERIOVENOUS  FISTULA Right 03/07/2015   Procedure: LIGATION OF RIGHT BRACHIOCEPHALIC ARTERIOVENOUS   FISTULA;  Surgeon: Conrad Martinsburg, MD;  Location: [place];  Service: Vascular;  Laterality: Right;  . LIGATION OF ARTERIOVENOUS  FISTULA Right 10/05/2015   Procedure: LIGATION OF RIGHT BASILIC VEIN TRANSPOSITION; EXCISION OF CICATRIX;  Surgeon: Conrad Diller, MD;  Location: [place];  Service: Vascular;  Laterality: Right;  . PERIPHERAL VASCULAR CATHETERIZATION [name] 01/24/2015   Procedure: Fistulagram;  Surgeon: Conrad Waynesville, MD;  Location: [place] [facility];  Service: Cardiovascular;  Laterality: [name];  . PERIPHERAL VASCULAR CATHETERIZATION [name] 08/29/2015   Procedure: Fistulagram;  Surgeon: Conrad Hodgenville, MD;  Location: [place] CV [facility];  Service: Cardiovascular;  Laterality: [name];  . PERIPHERAL VASCULAR CATHETERIZATION Right 08/29/2015   Procedure: Peripheral Vascular Balloon Angioplasty;  Surgeon: Conrad Allendale, MD;  Location: [place] [facility];  Service: Cardiovascular;  Laterality: Right;  arm fistula  . TEE WITHOUT CARDIOVERSION [name] 12/27/2014   Procedure: TRANSESOPHAGEAL ECHOCARDIOGRAM (TEE);  Surgeon: Thayer Headings, MD;  Location: [place];  Service: Cardiovascular;  Laterality: [name];  . THROMBECTOMY W/ EMBOLECTOMY Right 03/07/2015   Procedure: THROMBECTOMY OF RIGHT BRACHIOCEPHALIC ARTERIOVENOUS FISTULA;  Surgeon: Conrad Vega Alta, MD;  Location: [place];  Service: Vascular;  Laterality: Right;  . THROMBECTOMY W/ EMBOLECTOMY Right 01/27/2016   Procedure: THROMBECTOMY AND REVISION OF RIGHT UPPER ARM ARTERIOVENOUS GORE-TEX GRAFT;  Surgeon: Angelia Mould, MD;  Location: [facility] OR;  Service: Vascular;  Laterality: Right;    Current Medications: Outpatient Medications Prior to Visit  Medication Sig Dispense Refill  . albuterol (PROVENTIL HFA;VENTOLIN HFA) 108 (90 BASE) MCG/ACT inhaler Inhale 1-2 puffs into the lungs every 6 (six) hours as needed for wheezing or shortness of breath. Reported on 10/28/2015    . albuterol-ipratropium (COMBIVENT) 18-103 MCG/ACT inhaler Inhale 1 puff into the lungs daily as  needed for wheezing or shortness of breath. Reported on 10/28/2015    . aspirin 81 MG tablet Take 81 mg by mouth daily.    . calcium carbonate (TUMS EX) 750 MG chewable tablet Chew 750 mg by mouth 3 (three) times daily.     . carvedilol (COREG) 6.25 MG tablet Take 1 tablet (6.25 mg total) by mouth 2 (two) times daily with a meal. (Patient taking differently: Take 6.25 mg by mouth See admin instructions. Take 6.25 mg by mouth twice daily on Monday, Wednesday, Friday and Sunday.) 60 tablet 1  . fluticasone (FLONASE) 50 MCG/ACT nasal spray Place 1 spray into both nostrils daily as needed for allergies. Reported on 10/28/2015    . gabapentin (NEURONTIN) 300 MG capsule Take 1 capsule (300 mg total) by mouth at bedtime. 60 capsule 6  . Insulin Detemir (LEVEMIR FLEXPEN) 100 UNIT/ML Pen Inject 55 Units into the skin every Monday,Wednesday,Friday, and Sunday at 6 PM.    . insulin lispro (HUMALOG KWIKPEN) 100 UNIT/ML KiwkPen Inject 1-5 Units into the skin 3 (three) times daily as needed (if blood sugar is >150).     . Multiple Vitamins-Minerals (CENTRUM SILVER ADULT 50+ PO) Take 1  tablet by mouth every Monday,Wednesday,Friday, and Sunday at 6 PM.      No facility-administered medications prior to visit.      Allergies:   Vicodin [hydrocodone-acetaminophen] and Adhesive [tape]   Social History   Social History  . Marital status: Single    Spouse name: [name]  . Number of children: [name]  . Years of education: [name]   Social History Main Topics  . Smoking status: Never Smoker  . Smokeless tobacco: Never Used  . Alcohol use No  . Drug use: No  . Sexual activity: Not Asked   Other Topics Concern  . None   Social History Narrative  . None     Family History:  The patient's family history includes Diabetes in her mother; Hypertension in her mother and sister; Stroke in her sister.   Review of Systems:   Please see the history of present illness.     General:  No chills, fever, night sweats or weight  changes.  Cardiovascular:  No dyspnea on exertion, edema, orthopnea, palpitations, paroxysmal nocturnal dyspnea. Positive for chest pain.  Dermatological: No rash, lesions/masses Respiratory: No cough, dyspnea Urologic: No hematuria, dysuria Abdominal:   No nausea, vomiting, diarrhea, bright red blood per rectum, melena, or hematemesis Neurologic:  No visual changes, wkns, changes in mental status. All other systems reviewed and are otherwise negative except as noted above.   Physical Exam:    VS:  BP 120/68   Pulse 80   Ht 5\' 5"  (1.651 m)   Wt 287 lb (130.2 kg)   BMI 47.76 kg/m    General: Well developed, obese African American female appearing in no acute distress. Head: Normocephalic, atraumatic, sclera non-icteric, no xanthomas, nares are without discharge.  Neck: No carotid bruits. JVD not elevated.  Lungs: Respirations regular and unlabored, without wheezes or rales.  Heart: Regular rate and rhythm. No S3 or S4.  No murmur, no rubs, or gallops appreciated. Abdomen: Soft, non-tender, non-distended with normoactive bowel sounds. No hepatomegaly. No rebound/guarding. No obvious abdominal masses. Msk:  Strength and tone appear normal for age. No joint deformities or effusions. Extremities: No clubbing or cyanosis. No lower extremity edema.  Distal pedal pulses are 2+ bilaterally. Neuro: Alert and oriented X 3. Moves all extremities spontaneously. No focal deficits noted. Psych:  Responds to questions appropriately with a normal affect. Skin: No rashes or lesions noted  Wt Readings from Last 3 Encounters:  08/15/16 287 lb (130.2 kg)  08/03/16 285 lb 9.6 oz (129.5 kg)  01/27/16 271 lb (122.9 kg)     Studies/Labs Reviewed:   EKG:  EKG is not ordered today. EKG from 08/03/2016 reviewed which shows NSR, HR 72, with known LBBB.   Recent Labs: 08/29/2015: BUN 45; Creatinine, Ser 8.60 01/27/2016: Hemoglobin 12.2; Potassium 4.6; Sodium 137   Lipid Panel    Component Value  Date/Time   CHOL 199 10/12/2014 0745   TRIG 113 10/12/2014 0745   HDL 53 10/12/2014 0745   CHOLHDL 3.8 10/12/2014 0745   VLDL 23 10/12/2014 0745   LDLCALC 123 (H) 10/12/2014 0745    Additional studies/ records that were reviewed today include:   Cardiac Catheterization: 10/2014  Mild nonobstructive CAD  Severe pulmonary HTN  Elevated LV filling pressures.  Normal cardiac output   Recommendations: continue medical therapy. Needs additional IV diuresis.  TEE: 12/2014 Study Conclusions  - Left ventricle: Systolic function was normal. The estimated   ejection fraction was in the range of 50% to 55%. - Aortic  valve: No evidence of vegetation. There was moderate   regurgitation. - Mitral valve: No evidence of vegetation. - Left atrium: No evidence of thrombus in the atrial cavity or   appendage. - Pulmonic valve: No evidence of vegetation.  Assessment:    1. Atypical chest pain   2. Chronic diastolic CHF (congestive heart failure) ([place])   3. Essential hypertension   4. ESRD (end stage renal disease) on dialysis [facility])      Plan:   In order of problems listed above:  1. Atypical Chest Pain - she reports having occasional episodes of pain along her left pectoral region which goes into her left arm pit and occurs after consuming food. The symptoms last for 1 to 5 minutes at a time, relieved with TUMS or flatulence. No association with exertion.   - cath in 2016 showed minimal CAD and her recent EKG shows her known LBBB with no acute changes.  - her symptoms seem most consistent with a GI etiology, likely GERD. Will start Protonix. If no improvement, should follow-up with her PCP to see if a referral to GI is indicated.   2. Chronic Diastolic CHF - TEE in 37/6283 showed a preserved EF of 50-55%. She does not appear volume overloaded by physical examination.  - volume management by HD.   3. HTN - BP well-controlled at 120/68 during today's visit.  - continue Coreg  6.25mg  BID.   4. ESRD - on HD (Tuesday, Thursday, and Saturday). Continue to hold Coreg on HD days.    Medication Adjustments/Labs and Tests Ordered: Current medicines are reviewed at length with the patient today.  Concerns regarding medicines are outlined above.  Medication changes, Labs and Tests ordered today are listed in the Patient Instructions below. Patient Instructions  Medication Instructions:  START PROTONIX 20MG  DAILY  If you need a refill on your cardiac medications before your next appointment, please call your pharmacy.  Follow-Up: Your physician wants you to follow-up in: September 2018 WITH DR Patsy Lager You will receive a reminder letter in the mail two months in advance. If you don't receive a letter, please call our office July 2018 to schedule the September 2018 follow-up appointment.  Signed, Erma Heritage, PA-C  08/15/2016 9:01 PM    [facility] [place], [place], [place]  [address] Phone: [phone]; Fax: [phone]  [address], [place], [place] [address] Phone: [phone]

## 2016-08-15 NOTE — Patient Instructions (Signed)
Medication Instructions:  START PROTONIX 20MG  DAILY  If you need a refill on your cardiac medications before your next appointment, please call your pharmacy.  Follow-Up: Your physician wants you to follow-up in: September 2018 WITH DR Patsy Lager You will receive a reminder letter in the mail two months in advance. If you don't receive a letter, please call our office July 2018 to schedule the September 2018 follow-up appointment.   Thank you for choosing [facility] at [facility]!!

## 2016-08-20 ENCOUNTER — Other Ambulatory Visit: Payer: Self-pay | Admitting: Radiology

## 2016-08-20 ENCOUNTER — Other Ambulatory Visit (HOSPITAL_COMMUNITY): Payer: Self-pay | Admitting: Nephrology

## 2016-08-20 DIAGNOSIS — N186 End stage renal disease: Secondary | ICD-10-CM

## 2016-08-20 DIAGNOSIS — Z992 Dependence on renal dialysis: Principal | ICD-10-CM

## 2016-08-21 ENCOUNTER — Ambulatory Visit (HOSPITAL_COMMUNITY)
Admission: RE | Admit: 2016-08-21 | Discharge: 2016-08-21 | Disposition: A | Discharge status: Home / Self Care | Payer: Medicare Other | Source: Ambulatory Visit | Attending: Nephrology | Admitting: Nephrology

## 2016-08-22 ENCOUNTER — Other Ambulatory Visit: Payer: Self-pay | Admitting: Physician Assistant

## 2016-08-23 ENCOUNTER — Other Ambulatory Visit (HOSPITAL_COMMUNITY): Payer: Self-pay | Admitting: Nephrology

## 2016-08-23 ENCOUNTER — Encounter (HOSPITAL_COMMUNITY): Payer: Self-pay | Admitting: Interventional Radiology

## 2016-08-23 ENCOUNTER — Ambulatory Visit (HOSPITAL_COMMUNITY)
Admission: RE | Admit: 2016-08-23 | Discharge: 2016-08-23 | Disposition: A | Discharge status: Home / Self Care | Payer: Medicare Other | Source: Ambulatory Visit | Attending: Nephrology | Admitting: Nephrology

## 2016-08-23 DIAGNOSIS — T82858A Stenosis of vascular prosthetic devices, implants and grafts, initial encounter: Secondary | ICD-10-CM | POA: Insufficient documentation

## 2016-08-23 DIAGNOSIS — N186 End stage renal disease: Secondary | ICD-10-CM | POA: Insufficient documentation

## 2016-08-23 DIAGNOSIS — Y832 Surgical operation with anastomosis, bypass or graft as the cause of abnormal reaction of the patient, or of later complication, without mention of misadventure at the time of the procedure: Secondary | ICD-10-CM | POA: Insufficient documentation

## 2016-08-23 DIAGNOSIS — Z992 Dependence on renal dialysis: Secondary | ICD-10-CM

## 2016-08-23 HISTORY — PX: IR US GUIDE VASC ACCESS RIGHT: IMG2390

## 2016-08-23 HISTORY — PX: IR DIALY SHUNT INTRO NEEDLE/INTRACATH INITIAL W/IMG RIGHT: IMG6115

## 2016-08-23 MED ORDER — LIDOCAINE HCL (PF) 1 % IJ SOLN
INTRAMUSCULAR | Status: DC | PRN
  Administered 2016-08-23: 5 mL

## 2016-08-23 MED ORDER — LIDOCAINE HCL 1 % IJ SOLN
INTRAMUSCULAR | Status: AC
  Filled 2016-08-23: qty 20

## 2016-08-23 MED ORDER — IOPAMIDOL (ISOVUE-300) INJECTION 61%
INTRAVENOUS | Status: AC
  Administered 2016-08-23: 60 mL
  Filled 2016-08-23: qty 100

## 2016-08-24 ENCOUNTER — Encounter (HOSPITAL_COMMUNITY): Payer: Self-pay

## 2016-08-24 ENCOUNTER — Other Ambulatory Visit (HOSPITAL_COMMUNITY): Payer: Self-pay | Admitting: Nephrology

## 2016-08-24 DIAGNOSIS — N186 End stage renal disease: Secondary | ICD-10-CM

## 2016-08-24 DIAGNOSIS — Z992 Dependence on renal dialysis: Principal | ICD-10-CM

## 2016-08-26 DIAGNOSIS — Z853 Personal history of malignant neoplasm of breast: Secondary | ICD-10-CM | POA: Insufficient documentation

## 2016-08-26 NOTE — Progress Notes (Signed)
[place]  Telephone:(336) [phone] Fax:(336) [phone]  ID: Jordan [name] OB: 12/28/1940  MR#: [id number]  ATF#:[id number]  Patient Care Team: Leone Haven, MD as PCP - General (Family Medicine) Rexene Agent, MD as Attending Physician (Nephrology) Martinique, Peter M, MD as Consulting Physician (Cardiology) [place]: Personal history of breast cancer.  INTERVAL HISTORY: Patient is a 76 year old female who moved to [place] from [place] approximately 2 years ago. 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 Patient underwent a full mastectomy with lymph node dissection. She reports 8 lymph nodes were removed, but she is unclear how many were positive. She had adjuvant chemotherapy possibly using Taxotere and Cytoxan every 3 weeks for 4 cycles. She then received adjuvant XRT. Given the triple negative status of her breast cancer, an aromatase inhibitor was not initiated. She currently feels well and is at her baseline. She complains of left arm swelling secondary to lymphedema. She also has some pruritic skin at the site of her mastectomy scar. She has no neurologic complaints. She denies any recent fevers. She has a good appetite and denies weight loss. She has no chest pain or shortness of breath. She denies any nausea, vomiting, constipation, or diarrhea. She has no urinary complaints. Patient otherwise feels well and offers no further specific complaints.  REVIEW OF SYSTEMS:   Review of Systems  Constitutional: Negative.  Negative for fever, malaise/fatigue and weight loss.  Respiratory: Negative.  Negative for cough and shortness of breath.   Cardiovascular: Negative.  Negative for chest pain and leg swelling.  Gastrointestinal: Negative.  Negative for abdominal pain.  Genitourinary: Negative.   Musculoskeletal: Negative.   Skin: Positive for itching. Negative for rash.    Neurological: Negative.  Negative for sensory change and weakness.  Psychiatric/Behavioral: Negative.  The patient is not nervous/anxious.     As per HPI. Otherwise, a complete review of systems is negative.  PAST MEDICAL HISTORY: Past Medical History:  Diagnosis Date  . Arthritis   . Breast cancer ([place])   . CAD (coronary artery disease)    a. minimal CAD by cath in 10/2014.  Marland Kitchen CKD (chronic kidney disease)   . COPD (chronic obstructive pulmonary disease) ([place])   . Diabetes mellitus with ESRD (end-stage renal disease) ([place])   . GERD (gastroesophageal reflux disease)   . Hypertension   . Renal disorder    Family reports acute renal failure  . Sleep apnea    does not wear CPAP  . Systolic CHF, chronic ([place]) 12/23/2014    PAST SURGICAL HISTORY: Past Surgical History:  Procedure Laterality Date  . ARTERIOVENOUS GRAFT PLACEMENT Right 10/05/15  . AV FISTULA PLACEMENT Right 10/26/2014   Procedure: Right Brachiocephalic Arteriovenous FISTULA CREATION;  Surgeon: Conrad [place], MD;  Location: [facility];  Service: Vascular;  Laterality: Right;  . AV FISTULA PLACEMENT Right 10/05/2015   Procedure: INSERTION OF  ARTERIOVENOUS (AV) GORE-TEX GRAFT RIGHT ARM;  Surgeon: Conrad [place]ne, MD;  Location: [facility];  Service: Vascular;  Laterality: Right;  . BASCILIC VEIN TRANSPOSITION Right 04/13/2015   Procedure: RIGHT FIRST STAGE BASCILIC VEIN TRANSPOSITION;  Surgeon: Conrad Tatamy, MD;  Location: [facility];  Service: Vascular;  Laterality: Right;  . BASCILIC VEIN TRANSPOSITION Right 07/11/2015   Procedure: SECOND STAGE BASILIC VEIN TRANSPOSITION;  Surgeon: Conrad [place], MD;  Location: [facility];  Service: Vascular;  Laterality: Right;  . BREAST SURGERY Left   .  CARDIAC CATHETERIZATION [name] 10/19/2014   Procedure: Right/Left Heart Cath and Coronary Angiography;  Surgeon: Peter M Martinique, MD;  Location: [facility];  Service: Cardiovascular;  Laterality: [name];  . FISTULA SUPERFICIALIZATION Right 01/26/2015    Procedure: Right BRACHIOCEPHALIC ARTERIOVENOUS FISTULA SUPERFICIALIZATION with side branch ligation;  Surgeon: Conrad [place], MD;  Location: [facility];  Service: Vascular;  Laterality: Right;  . IR DIALY SHUNT INTRO [place] W/IMG RIGHT Right 08/23/2016  . IR GENERIC HISTORICAL  04/30/2016   IR REMOVAL TUN CV CATH W/O FL 04/30/2016 Aletta Edouard, MD [facility]  . IR US GUIDE VASC ACCESS RIGHT  08/23/2016  . LIGATION OF ARTERIOVENOUS  FISTULA Right 03/07/2015   Procedure: LIGATION OF RIGHT BRACHIOCEPHALIC ARTERIOVENOUS  FISTULA;  Surgeon: Conrad Yettem, MD;  Location: [facility];  Service: Vascular;  Laterality: Right;  . LIGATION OF ARTERIOVENOUS  FISTULA Right 10/05/2015   Procedure: LIGATION OF RIGHT BASILIC VEIN TRANSPOSITION; EXCISION OF CICATRIX;  Surgeon: Conrad [place], MD;  Location: [facility];  Service: Vascular;  Laterality: Right;  . PERIPHERAL VASCULAR CATHETERIZATION [name] 01/24/2015   Procedure: Fistulagram;  Surgeon: Conrad Corsica, MD;  Location: [facility];  Service: Cardiovascular;  Laterality: [name];  . PERIPHERAL VASCULAR CATHETERIZATION [name] 08/29/2015   Procedure: Fistulagram;  Surgeon: Conrad C[place], MD;  Location: [facility];  Service: Cardiovascular;  Laterality: [name];  . PERIPHERAL VASCULAR CATHETERIZATION Right 08/29/2015   Procedure: Peripheral Vascular Balloon Angioplasty;  Surgeon: Conrad [place], MD;  Location: [facility];  Service: Cardiovascular;  Laterality: Right;  arm fistula  . TEE WITHOUT CARDIOVERSION [name] 12/27/2014   Procedure: TRANSESOPHAGEAL ECHOCARDIOGRAM (TEE);  Surgeon: Thayer Headings, MD;  Location: [facility];  Service: Cardiovascular;  Laterality: [name];  . THROMBECTOMY W/ EMBOLECTOMY Right 03/07/2015   Procedure: THROMBECTOMY OF RIGHT BRACHIOCEPHALIC ARTERIOVENOUS FISTULA;  Surgeon: Conrad , MD;  Location: [facility];  Service: Vascular;  Laterality: Right;  . THROMBECTOMY W/ EMBOLECTOMY Right 01/27/2016   Procedure: THROMBECTOMY AND  REVISION OF RIGHT UPPER ARM ARTERIOVENOUS GORE-TEX GRAFT;  Surgeon: Angelia Mould, MD;  Location: [facility];  Service: Vascular;  Laterality: Right;    FAMILY HISTORY: Family History  Problem Relation Age of Onset  . Diabetes Mother   . Hypertension Mother   . Stroke Sister   . Hypertension Sister   . Heart attack Neg Hx     ADVANCED DIRECTIVES (Y/N):  N  HEALTH MAINTENANCE: Social History  Substance Use Topics  . Smoking status: Former Research scientist (life sciences)  . Smokeless tobacco: Never Used  . Alcohol use No     Colonoscopy:  PAP:  Bone density:  Lipid panel:  Allergies  Allergen Reactions  . Vicodin [Hydrocodone-Acetaminophen] Itching  . Adhesive [Tape] Itching, Rash and Other (See Comments)    Please use "paper" tape    Current Outpatient Prescriptions  Medication Sig Dispense Refill  . aspirin 81 MG tablet Take 81 mg by mouth every Monday,Wednesday,Friday, and Sunday at 6 PM.     . calcium carbonate (TUMS EX) 750 MG chewable tablet Chew 750 mg by mouth 3 (three) times daily with meals.     . carvedilol (COREG) 6.25 MG tablet Take 1 tablet (6.25 mg total) by mouth 2 (two) times daily with a meal. (Patient taking differently: Take 6.25 mg by mouth See admin instructions. Take 6.25 mg by mouth twice daily on Monday, Wednesday, Friday and Sunday.) 60 tablet 1  . gabapentin (NEURONTIN) 300 MG capsule Take  1 capsule (300 mg total) by mouth at bedtime. (Patient taking differently: Take 300 mg by mouth every Monday,Wednesday,Friday, and Sunday at 6 PM. ) 60 capsule 6  . Insulin Detemir (LEVEMIR FLEXPEN) 100 UNIT/ML Pen Inject 55 Units into the skin every Monday,Wednesday,Friday, and Sunday at 6 PM. (Patient taking differently: Inject 55 Units into the skin at bedtime. )    . insulin lispro (HUMALOG KWIKPEN) 100 UNIT/ML KiwkPen Inject 7-8 Units into the skin 3 (three) times daily as needed (if blood sugar is >150).      No current facility-administered medications for this visit.      OBJECTIVE: Vitals:   08/27/16 1122  BP: (!) 171/69  Pulse: 90  Resp: 18  Temp: (!) 96.8 F (36 C)     Body mass index is 48.56 kg/m.    ECOG FS:0 - Asymptomatic  General: Well-developed, well-nourished, no acute distress. Eyes: Pink conjunctiva, anicteric sclera. HEENT: Normocephalic, moist mucous membranes, clear oropharnyx. Breasts: Left mastectomy scar without evidence of recurrence. Lungs: Clear to auscultation bilaterally. Heart: Regular rate and rhythm. No rubs, murmurs, or gallops. Abdomen: Soft, nontender, nondistended. No organomegaly noted, normoactive bowel sounds. Musculoskeletal: Left arm lymphedema. Dialysis shunt in right upper arm. Neuro: Alert, answering all questions appropriately. Cranial nerves grossly intact. Skin: No rashes or petechiae noted. Psych: Normal affect.    LAB RESULTS:  Lab Results  Component Value Date   NA 137 01/27/2016   K 4.6 01/27/2016   CL 97 (L) 08/29/2015   CO2 24 12/28/2014   GLUCOSE 125 (H) 01/27/2016   BUN 45 (H) 08/29/2015   CREATININE 8.60 (H) 08/29/2015   CALCIUM 8.1 (L) 12/28/2014   PROT 6.9 10/12/2014   ALBUMIN 2.8 (L) 12/28/2014   AST 59 (H) 10/12/2014   ALT 37 10/12/2014   ALKPHOS 101 10/12/2014   BILITOT 0.2 (L) 10/12/2014   GFRNONAA 4 (L) 12/28/2014   GFRAA 4 (L) 12/28/2014    Lab Results  Component Value Date   WBC 11.4 (H) 12/28/2014   NEUTROABS 9.8 (H) 10/25/2014   HGB 12.2 01/27/2016   HCT 36.0 01/27/2016   MCV 85.0 12/28/2014   PLT 298 12/28/2014     STUDIES: Ir US Guide Vasc Access Right  Result Date: 08/23/2016 CLINICAL DATA:  End-stage renal disease. Poor flows through hemodialysis graft. EXAM: DIALYSIS SHUNTOGRAM FLUOROSCOPY TIME:  0.3 minute (16 42 uGym2 DAP) COMPARISON:  None TECHNIQUE: The procedure, risks (including but not limited to bleeding, infection, organ damage ), benefits, and alternatives were explained to the patient. Questions regarding the procedure were encouraged and  answered. The patient understands and consents to the procedure. Right upper arm prepped and draped in usual sterile fashion. Side infiltrated locally with 1% lidocaine. Under real-time ultrasound guidance, the shunt was accessed central to the arterial anastomosis with a 21 gauge micropuncture set for shuntogram. FINDINGS: There is good antegrade flow through the shunt. Areas of mild narrowing and irregularity at the apex of the shunt in the region of usual access site, and in the venous anastomosis. Central venous outflow through the SVC widely patent. No collateral filling. Reflux across the arterial anastomosis shows this to be widely patent. IMPRESSION: 1. Patent right upper arm dialysis shunt. No high-grade stenosis or occlusion. ACCESS: Remains approachable for percutaneous intervention as needed. ANESTHESIA/SEDATION: None CONTRAST:  60 mL Isovue PROCEDURE: Right upper arm prepped and draped in usual sterile fashion. Maximal barrier sterile technique was utilized including caps, mask, sterile gowns, sterile gloves, sterile drape, hand hygiene and  skin antiseptic. Electronically Signed   By: Lucrezia Europe M.D.   On: 08/23/2016 13:32   Ir Dialy Shunt Intro Needle/intracath Initial W/img Right  Result Date: 08/23/2016 CLINICAL DATA:  End-stage renal disease. Poor flows through hemodialysis graft. EXAM: DIALYSIS SHUNTOGRAM FLUOROSCOPY TIME:  0.3 minute (16 42 uGym2 DAP) COMPARISON:  None TECHNIQUE: The procedure, risks (including but not limited to bleeding, infection, organ damage ), benefits, and alternatives were explained to the patient. Questions regarding the procedure were encouraged and answered. The patient understands and consents to the procedure. Right upper arm prepped and draped in usual sterile fashion. Side infiltrated locally with 1% lidocaine. Under real-time ultrasound guidance, the shunt was accessed central to the arterial anastomosis with a 21 gauge micropuncture set for shuntogram.  FINDINGS: There is good antegrade flow through the shunt. Areas of mild narrowing and irregularity at the apex of the shunt in the region of usual access site, and in the venous anastomosis. Central venous outflow through the SVC widely patent. No collateral filling. Reflux across the arterial anastomosis shows this to be widely patent. IMPRESSION: 1. Patent right upper arm dialysis shunt. No high-grade stenosis or occlusion. ACCESS: Remains approachable for percutaneous intervention as needed. ANESTHESIA/SEDATION: None CONTRAST:  60 mL Isovue PROCEDURE: Right upper arm prepped and draped in usual sterile fashion. Maximal barrier sterile technique was utilized including caps, mask, sterile gowns, sterile gloves, sterile drape, hand hygiene and skin antiseptic. Electronically Signed   By: Lucrezia Europe M.D.   On: 08/23/2016 13:32    ASSESSMENT: Personal history of breast cancer  PLAN:    1. Personal history of breast cancer: No obvious evidence of recurrence. 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 Patient underwent a full mastectomy with lymph node dissection. She reports 8 lymph nodes were removed, but she is unclear how many were positive. She had adjuvant chemotherapy possibly using Taxotere and Cytoxan every 3 weeks for 4 cycles. She then received adjuvant XRT. Given the triple negative status of her breast cancer, an aromatase inhibitor was not initiated. No further intervention is needed. Patient will require a right breast screening mammogram in the next 1-2 weeks which has been ordered. Given the lymphedema in her left arm and her dialysis shunt in her right arm, cannot draw blood in the [place] to monitor CA-27-29. If there is concern, this could be drawn at dialysis. Return to clinic in 4 months for routine evaluation. 2. Lymphedema: Patient was given a referral to lymphedema clinic. 3. End-stage renal disease: Continue dialysis as directed from  shunt in patient's right arm.  Approximately 60 minutes was spent in discussion of which greater than 50% was consultation.   Patient expressed understanding and was in agreement with this plan. She also understands that She can call clinic at any time with any questions, concerns, or complaints.    Lloyd Huger, MD   08/27/2016 1:19 PM

## 2016-08-27 ENCOUNTER — Telehealth: Payer: Self-pay

## 2016-08-27 ENCOUNTER — Encounter: Payer: Self-pay | Admitting: Oncology

## 2016-08-27 ENCOUNTER — Ambulatory Visit: Payer: Medicare (Managed Care) | Admitting: Student

## 2016-08-27 ENCOUNTER — Inpatient Hospital Stay: Payer: Medicare Other | Attending: Oncology | Admitting: Oncology

## 2016-08-27 VITALS — BP 171/69 | HR 90 | Temp 96.8°F | Resp 18 | Wt 291.8 lb

## 2016-08-27 DIAGNOSIS — M129 Arthropathy, unspecified: Secondary | ICD-10-CM

## 2016-08-27 DIAGNOSIS — Z9013 Acquired absence of bilateral breasts and nipples: Secondary | ICD-10-CM

## 2016-08-27 DIAGNOSIS — Z853 Personal history of malignant neoplasm of breast: Secondary | ICD-10-CM | POA: Diagnosis not present

## 2016-08-27 DIAGNOSIS — I5022 Chronic systolic (congestive) heart failure: Secondary | ICD-10-CM | POA: Insufficient documentation

## 2016-08-27 DIAGNOSIS — I2510 Atherosclerotic heart disease of native coronary artery without angina pectoris: Secondary | ICD-10-CM

## 2016-08-27 DIAGNOSIS — N186 End stage renal disease: Secondary | ICD-10-CM | POA: Diagnosis not present

## 2016-08-27 DIAGNOSIS — Z794 Long term (current) use of insulin: Secondary | ICD-10-CM | POA: Diagnosis not present

## 2016-08-27 DIAGNOSIS — Z923 Personal history of irradiation: Secondary | ICD-10-CM | POA: Diagnosis not present

## 2016-08-27 DIAGNOSIS — Z9221 Personal history of antineoplastic chemotherapy: Secondary | ICD-10-CM | POA: Diagnosis not present

## 2016-08-27 DIAGNOSIS — M7989 Other specified soft tissue disorders: Secondary | ICD-10-CM | POA: Diagnosis not present

## 2016-08-27 DIAGNOSIS — I890 Lymphedema, not elsewhere classified: Secondary | ICD-10-CM

## 2016-08-27 DIAGNOSIS — Z79899 Other long term (current) drug therapy: Secondary | ICD-10-CM | POA: Diagnosis not present

## 2016-08-27 DIAGNOSIS — K219 Gastro-esophageal reflux disease without esophagitis: Secondary | ICD-10-CM

## 2016-08-27 DIAGNOSIS — Z7982 Long term (current) use of aspirin: Secondary | ICD-10-CM | POA: Diagnosis not present

## 2016-08-27 DIAGNOSIS — G4730 Sleep apnea, unspecified: Secondary | ICD-10-CM | POA: Diagnosis not present

## 2016-08-27 DIAGNOSIS — I120 Hypertensive chronic kidney disease with stage 5 chronic kidney disease or end stage renal disease: Secondary | ICD-10-CM | POA: Diagnosis not present

## 2016-08-27 DIAGNOSIS — E1122 Type 2 diabetes mellitus with diabetic chronic kidney disease: Secondary | ICD-10-CM | POA: Insufficient documentation

## 2016-08-27 DIAGNOSIS — J449 Chronic obstructive pulmonary disease, unspecified: Secondary | ICD-10-CM | POA: Insufficient documentation

## 2016-08-27 DIAGNOSIS — C50912 Malignant neoplasm of unspecified site of left female breast: Secondary | ICD-10-CM

## 2016-08-27 DIAGNOSIS — Z87891 Personal history of nicotine dependence: Secondary | ICD-10-CM | POA: Diagnosis not present

## 2016-08-27 NOTE — Telephone Encounter (Signed)
LVM to call [phone].  Pt needs to [facility] and sign a release of records prior to scheduling [facility] appt. They need to get patients previous images prior scans before new ones can be done.

## 2016-08-27 NOTE — Progress Notes (Signed)
Pt here for follow up of history of breast cancer, diagnosed appx 4 years ago. States received surgery, chemo/radiation for breast cancer. Last mammo was over 1 year ago in [place] where patient moved from.

## 2016-09-05 ENCOUNTER — Ambulatory Visit: Payer: Medicare Other | Admitting: Family

## 2016-09-28 IMAGING — CR DG HIP (WITH OR WITHOUT PELVIS) 2-3V*L*
4 series · 4 of 4 positions shown · non-contrast
Comparison: None.

CLINICAL DATA: Left hip pain.

EXAM:
LEFT HIP (WITH PELVIS) 2-3 VIEWS

[pelvis ap (1 of 2)]
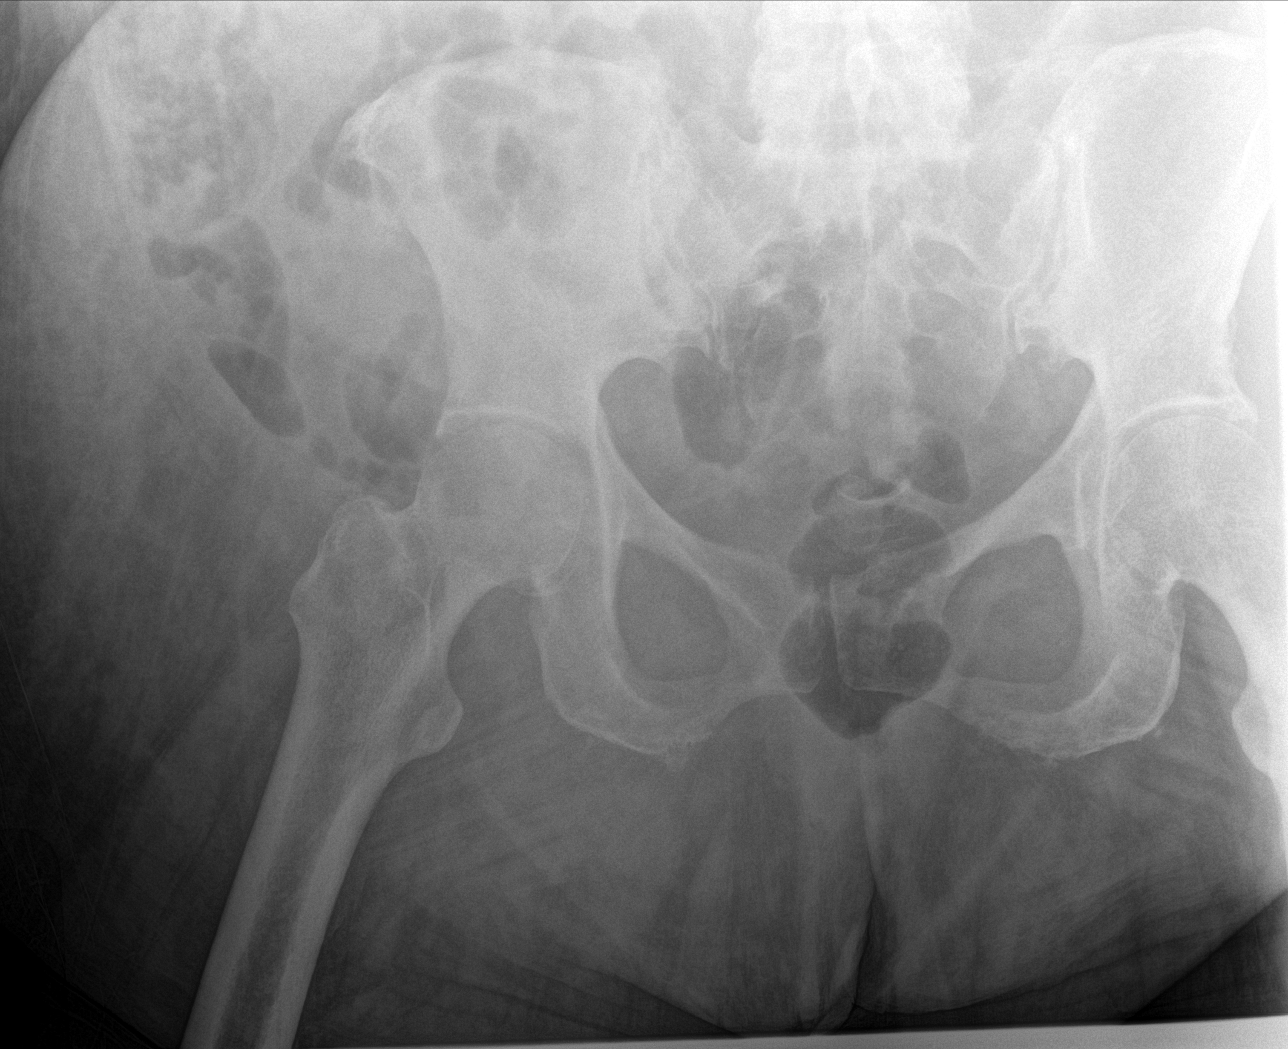

[hip ap]
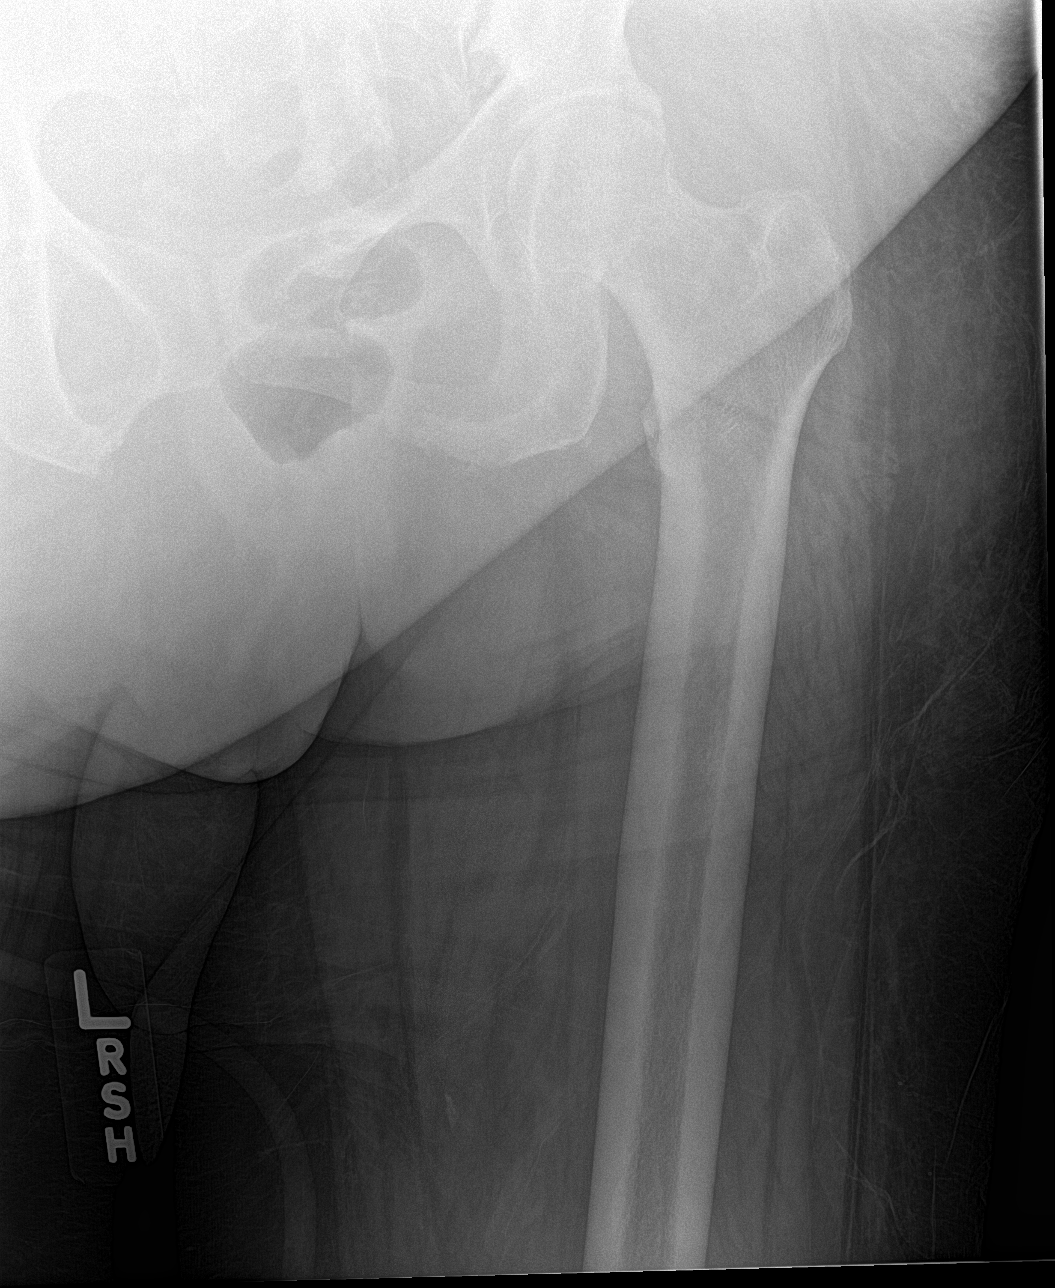

[hip lat]
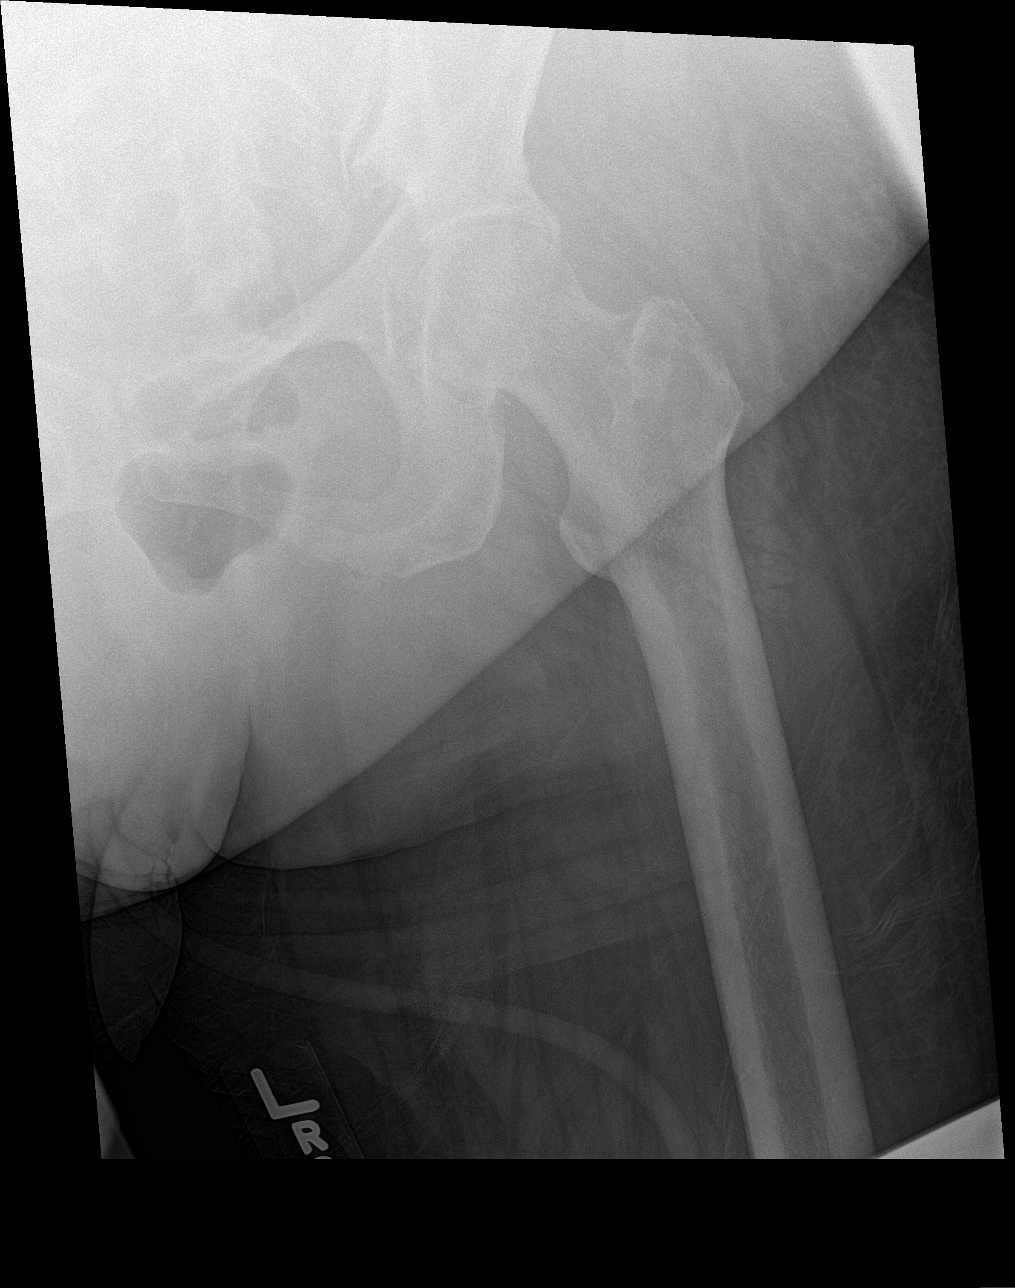

[pelvis ap (2 of 2)]
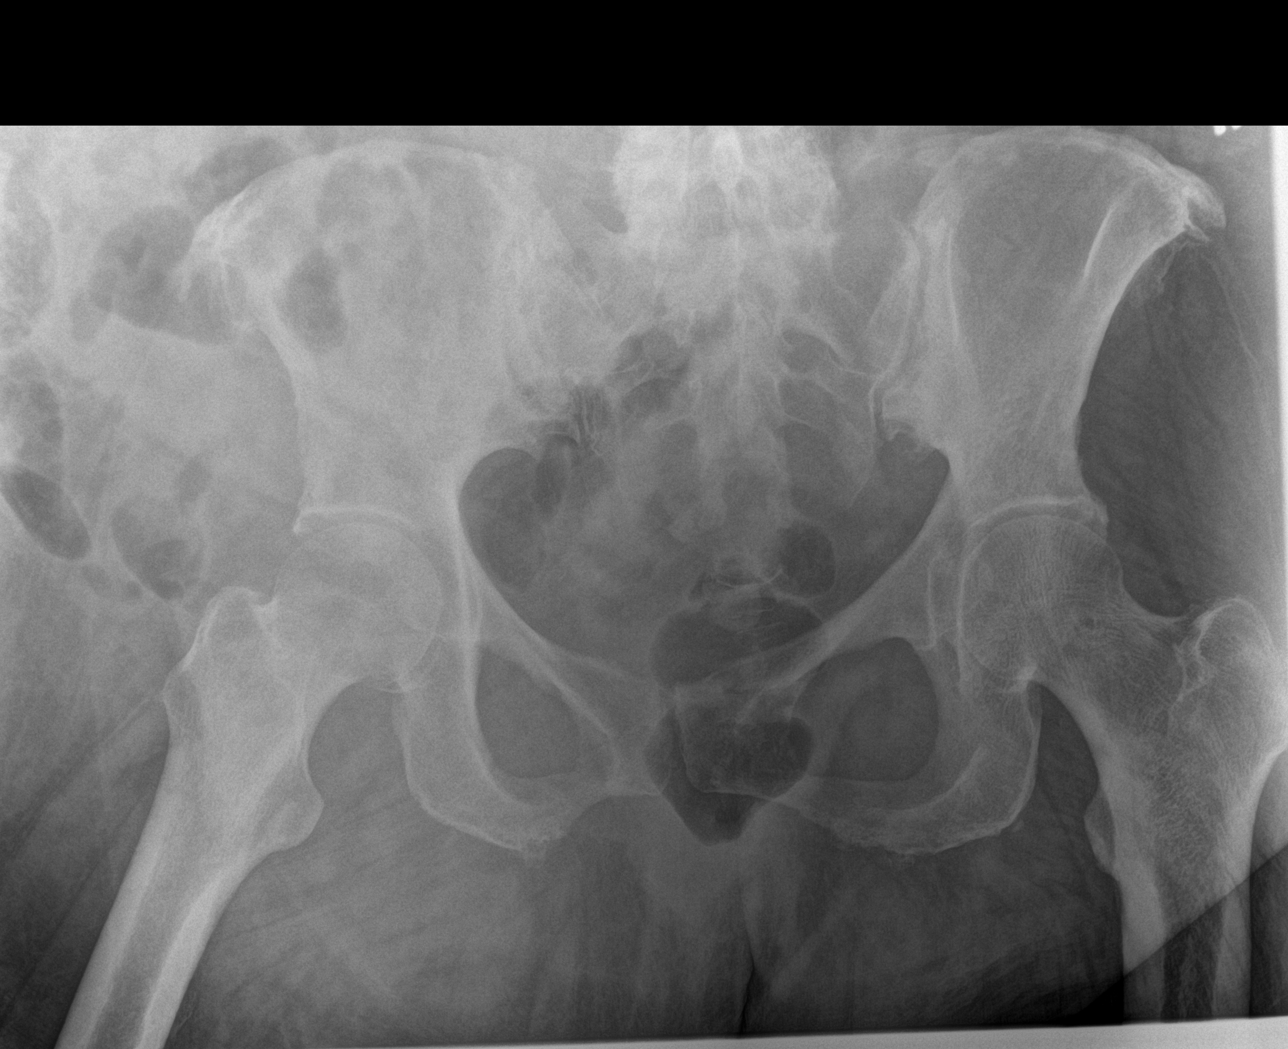

[4 of 4 positions shown; findings below may reference images not displayed]

FINDINGS: The left hip is located. No acute bone or soft tissue abnormality is
present. Degenerative changes are noted in the SI joints bilaterally
and lower lumbar spine.
IMPRESSION: 1. Normal radiographic appearance of the left hip.
2. Degenerative changes within the lower lumbar spine and SI joints.

## 2016-10-01 IMAGING — CT CT HIP*L* W/O CM
2 of 3 series · 9 of 46 positions shown, 10 images · non-contrast
Comparison: Left hip x-rays 10/13/2014.

CLINICAL DATA: Patient presented 10/12/2014 with shortness of
breath and syncope and was found to have a non ST elevation MI.
Prior history of left breast cancer post mastectomy. Acute
superimposed upon chronic left hip pain. No known injuries.

EXAM:
CT OF THE LEFT HIP WITHOUT CONTRAST
TECHNIQUE: Multidetector CT imaging of the left hip was performed according to
the standard protocol. Multiplanar CT image reconstructions were
also generated.

[Series 202: soft tissue · axial · 0.31mm/px · z∈[-195,-71]mm · 6 of 78 slices shown, 7 images]
[im 8/78  soft-tissue]
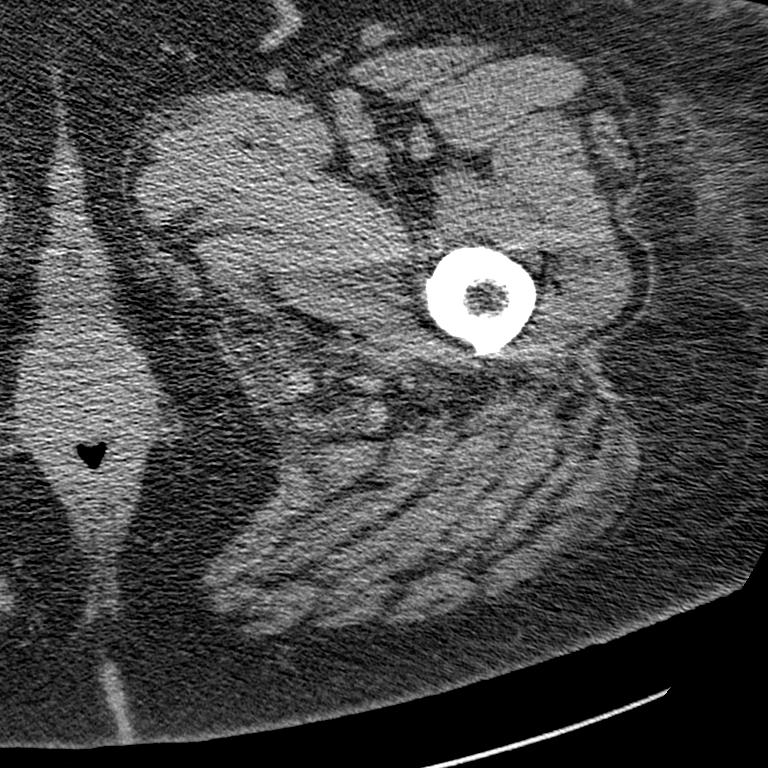
[im 8/78  bone]
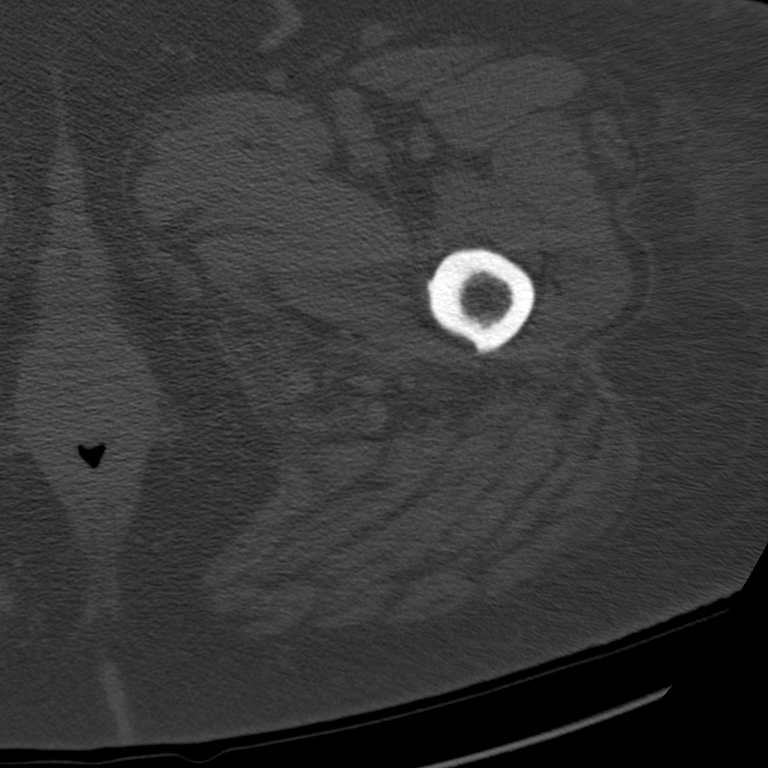
[im 20/78  soft-tissue]
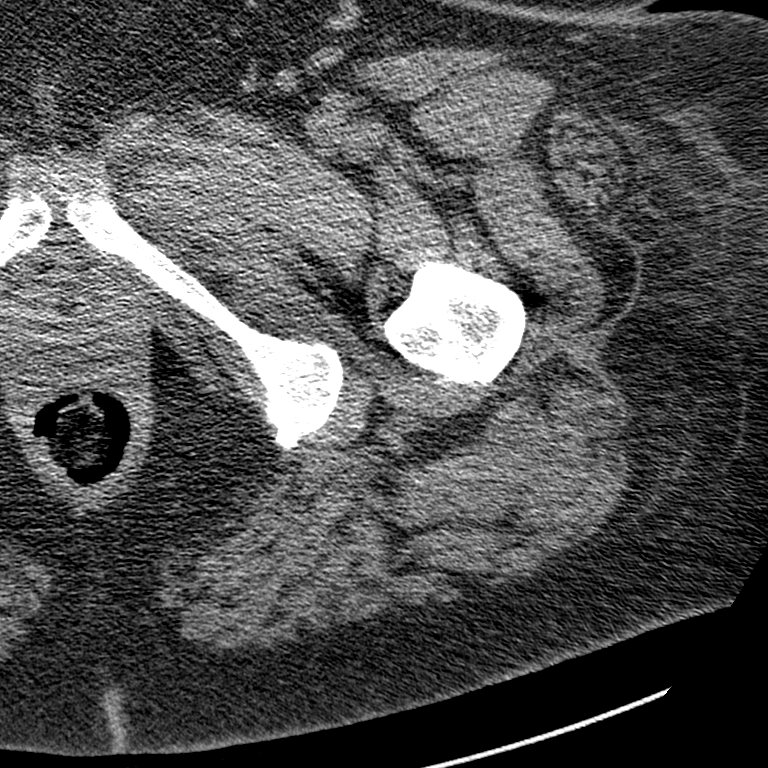
[im 33/78  soft-tissue]
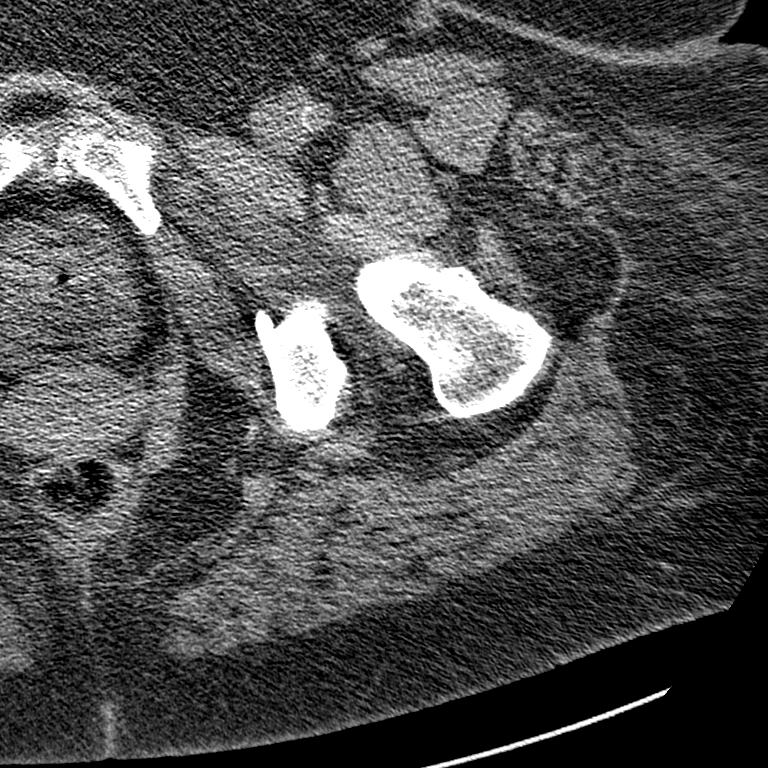
[im 45/78  soft-tissue]
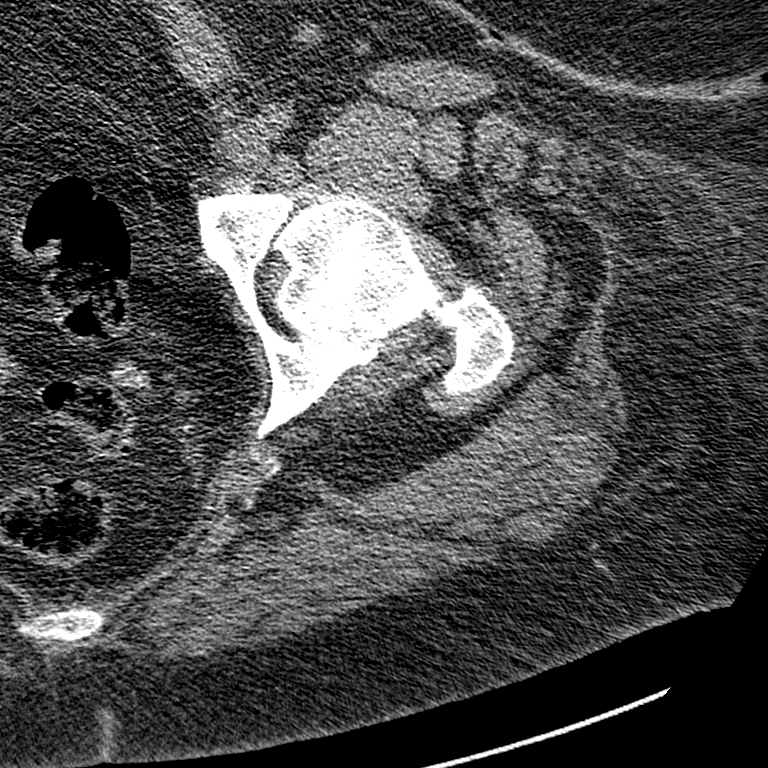
[im 58/78  soft-tissue]
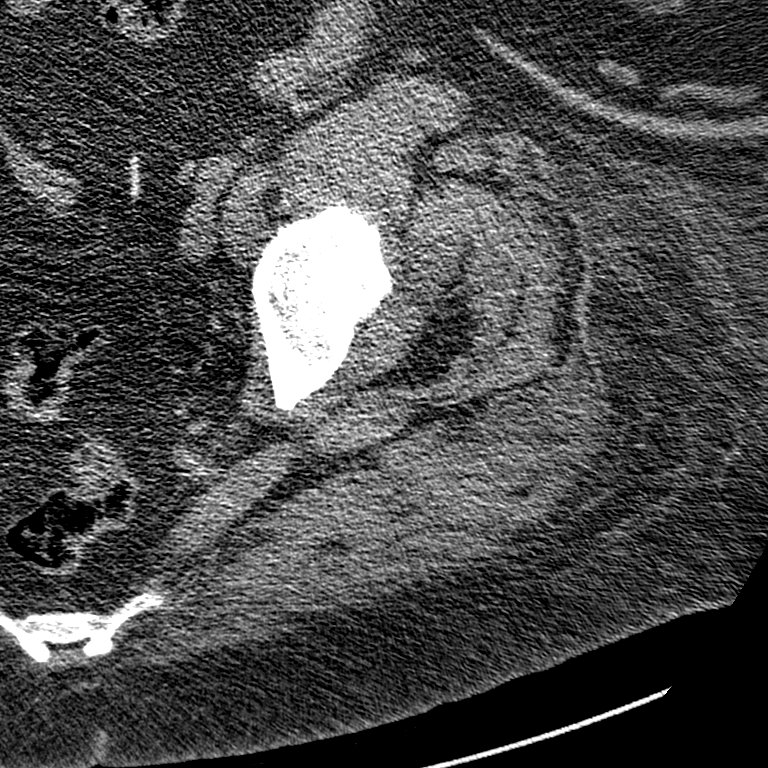
[im 70/78  soft-tissue]
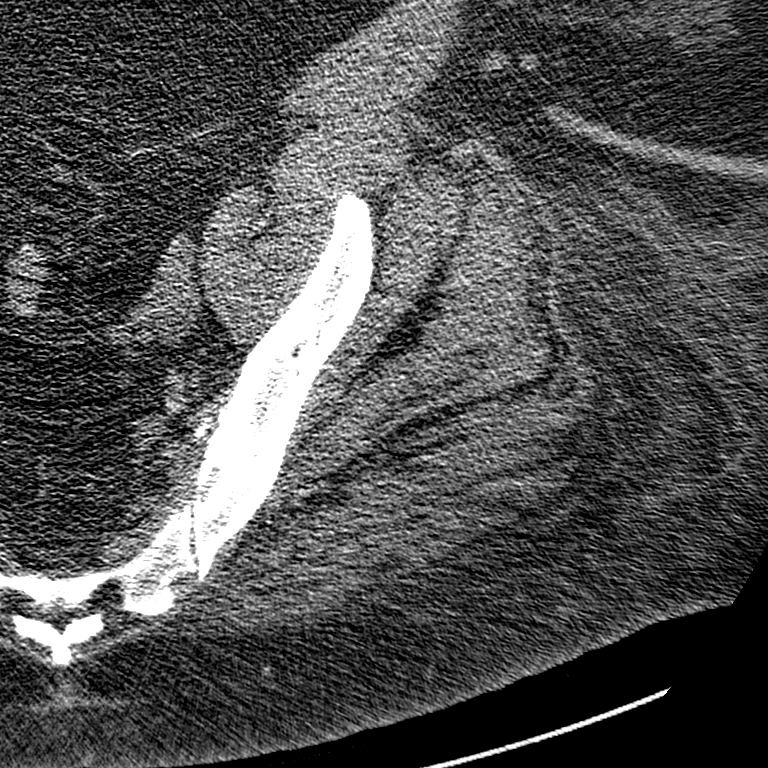

[Series 203: coronal · coronal · 0.31mm/px · 3 of 110 slices shown]
[im 37/110  soft-tissue]
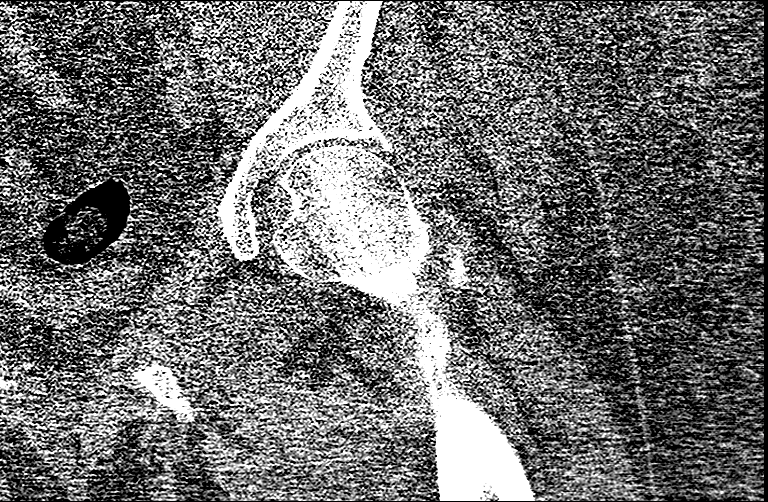
[im 49/110  soft-tissue]
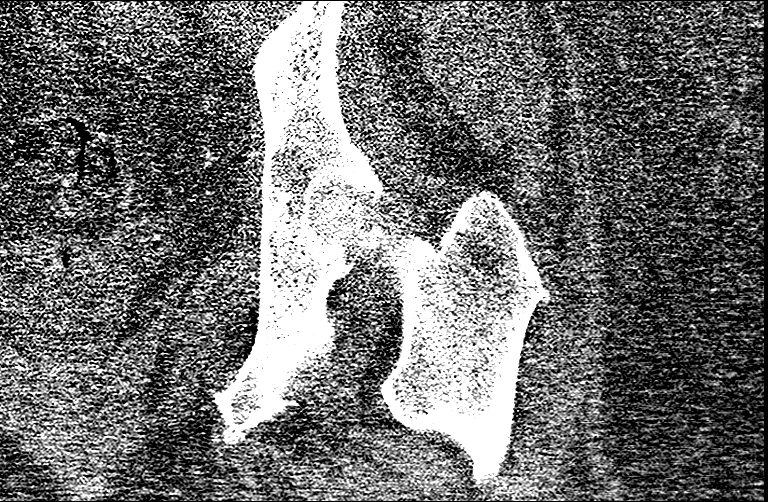
[im 61/110  soft-tissue]
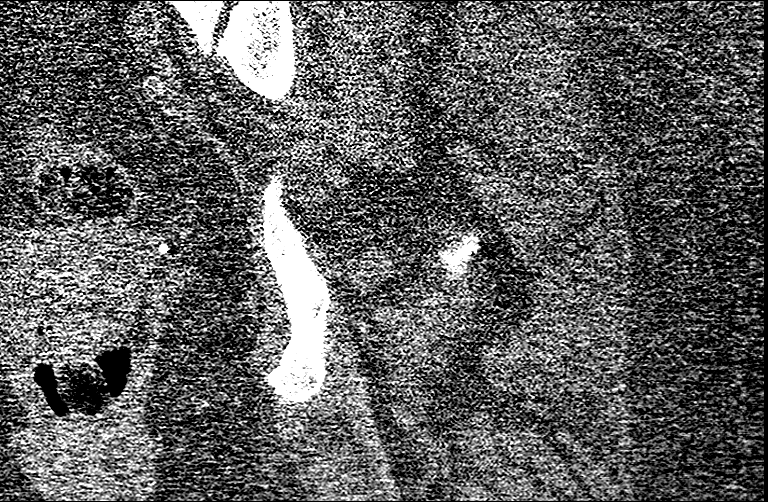

[9 of 46 positions shown; findings below may reference images not displayed]

FINDINGS: No evidence of acute, subacute or healed fractures. Severe narrowing
of the joint space posteriorly with associated hypertrophic spurring
involving the posterior column of the acetabulum. Moderate joint
space narrowing elsewhere. Very small joint effusion. No evidence of
osseous metastatic disease involving the visualized bones comprising
the left hip.
IMPRESSION: 1. No acute or subacute osseous abnormality.
2. Moderate osteoarthritis.
3. Very small joint effusion.
4. No evidence of osseous metastatic disease.

## 2016-11-02 ENCOUNTER — Ambulatory Visit: Payer: Medicare (Managed Care) | Admitting: Family Medicine

## 2016-11-09 ENCOUNTER — Encounter: Payer: Self-pay | Admitting: Family Medicine

## 2016-11-09 ENCOUNTER — Ambulatory Visit (INDEPENDENT_AMBULATORY_CARE_PROVIDER_SITE_OTHER): Payer: Medicare Other | Admitting: Family Medicine

## 2016-11-09 ENCOUNTER — Ambulatory Visit (INDEPENDENT_AMBULATORY_CARE_PROVIDER_SITE_OTHER): Payer: Medicare Other

## 2016-11-09 VITALS — BP 126/86 | HR 83 | Temp 98.9°F | Ht 65.0 in | Wt 284.2 lb

## 2016-11-09 DIAGNOSIS — E1121 Type 2 diabetes mellitus with diabetic nephropathy: Secondary | ICD-10-CM

## 2016-11-09 DIAGNOSIS — M546 Pain in thoracic spine: Secondary | ICD-10-CM

## 2016-11-09 DIAGNOSIS — Z794 Long term (current) use of insulin: Secondary | ICD-10-CM | POA: Diagnosis not present

## 2016-11-09 DIAGNOSIS — G8929 Other chronic pain: Secondary | ICD-10-CM | POA: Diagnosis not present

## 2016-11-09 DIAGNOSIS — I890 Lymphedema, not elsewhere classified: Secondary | ICD-10-CM

## 2016-11-09 DIAGNOSIS — R202 Paresthesia of skin: Secondary | ICD-10-CM | POA: Diagnosis not present

## 2016-11-09 MED ORDER — CARVEDILOL 6.25 MG PO TABS: 6.2500 mg | ORAL_TABLET | ORAL | 3 refills | Status: DC

## 2016-11-09 MED ORDER — CARVEDILOL 6.25 MG PO TABS: 6.2500 mg | ORAL_TABLET | ORAL | 3 refills | Status: AC

## 2016-11-09 MED ORDER — INSULIN DETEMIR 100 UNIT/ML FLEXPEN: 55.0000 [IU] | PEN_INJECTOR | Freq: Every day | SUBCUTANEOUS | 3 refills | Status: DC

## 2016-11-09 NOTE — Assessment & Plan Note (Signed)
Well-controlled. Reports her last A1c was 7 checked through her dialysis center. We'll request records from them. Continue current medications.

## 2016-11-09 NOTE — Progress Notes (Signed)
Pre visit review using our clinic review tool, if applicable. No additional management support is needed unless otherwise documented below in the visit note. 

## 2016-11-09 NOTE — Assessment & Plan Note (Addendum)
2 months of symptoms that have not improved. She is tender in this area. Potentially could be muscular strain though given persistent discomfort and her history of breast cancer we will obtain a x-ray.

## 2016-11-09 NOTE — Assessment & Plan Note (Signed)
Continues to have some issues with this. Does have some left shoulder pain that potentially could be attributable to lymphedema. We'll get her set up with physical therapy for her lymphedema.

## 2016-11-09 NOTE — Progress Notes (Signed)
[name], MD Phone: [phone]  Jordan [name] is a 76 y.o. female who presents today for follow-up.  Patient notes 2 months of right midthoracic back pain. No noted injury. Notes it'll catch if she lays on that side or if she twisted the wrong way. No radiation down her legs. No numbness or weakness in her legs. No bowel or bladder incontinence or saddle anesthesia. She has not tried any medications for this.  Left shoulder pain and neck pain. Has continued to have issues with this. She saw her cardiologist who advised her it is not related to her heart. She wonders if it could be related to the lymphedema. Occasionally it will cramp. Occasionally she'll have numbness in the radial aspect of her left arm. No weakness. Some right-sided neck pain. No injury to this area. She has not been seen by the lymphedema clinic.  Diabetes: Typically around 130 when she checks. Taking Levemir 55 units daily. No polyuria or polydipsia. No hypoglycemia. She's due to see ophthalmology.  PMH: Former smoker   ROS see history of present illness  Objective  Physical Exam Vitals:   11/09/16 1405  BP: 126/86  Pulse: 83  Temp: 98.9 F (37.2 C)    BP Readings from Last 3 Encounters:  11/09/16 126/86  08/27/16 (!) 171/69  08/15/16 120/68   Wt Readings from Last 3 Encounters:  11/09/16 284 lb 3.2 oz (128.9 kg)  08/27/16 291 lb 12.8 oz (132.4 kg)  08/15/16 287 lb (130.2 kg)    Physical Exam  Constitutional: No distress.  Cardiovascular: Normal rate, regular rhythm and normal heart sounds.   Pulmonary/Chest: Effort normal and breath sounds normal.  Musculoskeletal:  No midline spine tenderness, no midline spine step-off, right mid thoracic muscular back tenderness with no overlying skin changes, full range of motion bilateral shoulders with no discomfort on passive range of motion, some discomfort in left shoulder on active internal rotation, no trapezius tenderness  Neurological:  She is alert.  5/5 strength in bilateral biceps, triceps, grip, quads, hamstrings, plantar and dorsiflexion, sensation to light touch intact in bilateral UE and LE  Skin: She is not diaphoretic.   Diabetic Foot Exam - Simple   Simple Foot Form Diabetic Foot exam was performed with the following findings:  Yes 11/09/2016  2:42 PM  Visual Inspection No deformities, no ulcerations, no other skin breakdown bilaterally:  Yes Sensation Testing Intact to touch and monofilament testing bilaterally:  Yes Pulse Check See comments:  Yes Comments DP pulses intact bilaterally, PT pulse difficult to appreciate given body habitus ankles     Assessment/Plan: Please see individual problem list.  Lymphedema of left arm Continues to have some issues with this. Does have some left shoulder pain that potentially could be attributable to lymphedema. We'll get her set up with physical therapy for her lymphedema.  Type 2 diabetes mellitus with diabetic nephropathy Well-controlled. Reports her last A1c was 7 checked through her dialysis center. We'll request records from them. Continue current medications.  Paresthesia of left arm Patient reports some occasional neck pain and numbness in her radial aspect of her left arm. We'll obtain an x-ray of her neck to evaluate and see if this is contributing to the discomfort she has in her shoulder as well.  Chronic thoracic back pain 2 months of symptoms that have not improved. She is tender in this area. Potentially could be muscular strain though given persistent discomfort and her history of breast cancer we will obtain a x-ray.  Orders Placed This Encounter  Procedures  . DG Cervical Spine Complete    Standing Status:   Future    Number of Occurrences:   1    Standing Expiration Date:   01/10/2018    Order Specific Question:   Reason for Exam (SYMPTOM  OR DIAGNOSIS REQUIRED)    Answer:   neck pain, left shoulder pain with numbness and paresthesias down  radial aspect left arm    Order Specific Question:   Preferred imaging location?    Answer:   [facility] Specific Question:   Radiology Contrast Protocol - do NOT remove file path    Answer:   \\[email or web address]  . DG Thoracic Spine W/Swimmers    Standing Status:   Future    Number of Occurrences:   1    Standing Expiration Date:   01/10/2018    Order Specific Question:   Reason for Exam (SYMPTOM  OR DIAGNOSIS REQUIRED)    Answer:   persistent right mid thoracic back pain    Order Specific Question:   Preferred imaging location?    Answer:   [facility] Specific Question:   Radiology Contrast Protocol - do NOT remove file path    Answer:   \\[email or web address].[email or web address]  . Ambulatory referral to Ophthalmology    Referral Priority:   Routine    Referral Type:   Consultation    Referral Reason:   Specialty Services Required    Requested Specialty:   Ophthalmology    Number of Visits Requested:   1  . Ambulatory referral to Physical Therapy    Referral Priority:   Routine    Referral Type:   Physical Medicine    Referral Reason:   Specialty Services Required    Requested Specialty:   Physical Therapy    Number of Visits Requested:   1    Meds ordered this encounter  Medications  . DISCONTD: carvedilol (COREG) 6.25 MG tablet    Sig: Take 1 tablet (6.25 mg total) by mouth See admin instructions. Take 6.25 mg by mouth twice daily on Monday, Wednesday, Friday and Sunday.    Dispense:  60 tablet    Refill:  3  . Insulin Detemir (LEVEMIR FLEXPEN) 100 UNIT/ML Pen    Sig: Inject 55 Units into the skin at bedtime.    Dispense:  18 mL    Refill:  3  . carvedilol (COREG) 6.25 MG tablet    Sig: Take 1 tablet (6.25 mg total) by mouth See admin instructions. Take 6.25 mg by mouth twice daily on Monday, Wednesday, Friday and Sunday.    Dispense:  60 tablet    Refill:  3    Health maintenance: We'll get her set up with ophthalmology for eye exam given her diabetes. She does not want to do colonoscopy for colon cancer screening. She's never had one. No bleeding. A family history of colon cancer. We'll order a cologuard. She was advised that she needs to check with her insurance company to make sure this is covered prior to providing a sample.  [name], MD [facility]

## 2016-11-09 NOTE — Assessment & Plan Note (Signed)
Patient reports some occasional neck pain and numbness in her radial aspect of her left arm. We'll obtain an x-ray of her neck to evaluate and see if this is contributing to the discomfort she has in her shoulder as well.

## 2016-11-09 NOTE — Patient Instructions (Signed)
Nice to see you. We'll check on the lymphedema clinic. We'll obtain x-rays today and contact you with the results and then determine where to go from there. If you develop worsening pain or any new or changing symptoms please be evaluated.

## 2016-11-12 ENCOUNTER — Other Ambulatory Visit: Payer: Self-pay | Admitting: Family Medicine

## 2016-11-12 DIAGNOSIS — M5030 Other cervical disc degeneration, unspecified cervical region: Secondary | ICD-10-CM

## 2016-11-12 DIAGNOSIS — R202 Paresthesia of skin: Secondary | ICD-10-CM

## 2016-11-16 ENCOUNTER — Ambulatory Visit
Admission: RE | Admit: 2016-11-16 | Discharge: 2016-11-16 | Disposition: A | Discharge status: Home / Self Care | Payer: Medicare Other | Source: Ambulatory Visit | Attending: Family Medicine | Admitting: Family Medicine

## 2016-11-16 DIAGNOSIS — M4802 Spinal stenosis, cervical region: Secondary | ICD-10-CM | POA: Diagnosis not present

## 2016-11-16 DIAGNOSIS — M5021 Other cervical disc displacement,  high cervical region: Secondary | ICD-10-CM | POA: Diagnosis not present

## 2016-11-16 DIAGNOSIS — R202 Paresthesia of skin: Secondary | ICD-10-CM | POA: Diagnosis present

## 2016-11-20 ENCOUNTER — Telehealth: Payer: Self-pay | Admitting: *Deleted

## 2016-11-20 NOTE — Telephone Encounter (Signed)
Patient has requested MR results  Pt contact [phone]

## 2016-11-20 NOTE — Telephone Encounter (Signed)
See result note.  

## 2016-11-21 ENCOUNTER — Ambulatory Visit: Payer: Medicare Other | Attending: Family Medicine | Admitting: Occupational Therapy

## 2016-11-21 ENCOUNTER — Encounter: Payer: Self-pay | Admitting: Occupational Therapy

## 2016-11-21 DIAGNOSIS — I972 Postmastectomy lymphedema syndrome: Secondary | ICD-10-CM | POA: Insufficient documentation

## 2016-11-21 DIAGNOSIS — M79602 Pain in left arm: Secondary | ICD-10-CM | POA: Diagnosis present

## 2016-11-22 ENCOUNTER — Other Ambulatory Visit: Payer: Self-pay | Admitting: Family Medicine

## 2016-11-22 DIAGNOSIS — M5030 Other cervical disc degeneration, unspecified cervical region: Secondary | ICD-10-CM

## 2016-11-22 LAB — COLOGUARD: Cologuard: NEGATIVE

## 2016-11-22 NOTE — Therapy (Signed)
[facility] [address] [place], [address] Phone: [phone]   Fax:  [phone]  Occupational Therapy Evaluation  Patient Details  Name: Jordan [name] MRN: [id number] Date of Birth: 1970-09-06 Referring Provider: Bennetta Laos  Encounter Date: 11/21/2016      OT End of Session - 11/22/16 1122    Visit Number 1   Number of Visits 24   Date for OT Re-Evaluation 01/02/17   OT Start Time 0910   OT Stop Time 1015   OT Time Calculation (min) 65 min   Activity Tolerance Patient tolerated treatment well   Behavior During Therapy [facility] for tasks assessed/performed      Past Medical History:  Diagnosis Date  . Arthritis   . Breast cancer ([place])   . CAD (coronary artery disease)    a. minimal CAD by cath in 10/2014.  Marland Kitchen CKD (chronic kidney disease)   . COPD (chronic obstructive pulmonary disease) ([place])   . Diabetes mellitus with ESRD (end-stage renal disease) ([place])   . GERD (gastroesophageal reflux disease)   . Hypertension   . Renal disorder    Family reports acute renal failure  . Sleep apnea    does not wear CPAP  . Systolic CHF, chronic ([place]) 12/23/2014    Past Surgical History:  Procedure Laterality Date  . ARTERIOVENOUS GRAFT PLACEMENT Right 10/05/15  . AV FISTULA PLACEMENT Right 10/26/2014   Procedure: Right Brachiocephalic Arteriovenous FISTULA CREATION;  Surgeon: Conrad Braymer, MD;  Location: [place];  Service: Vascular;  Laterality: Right;  . AV FISTULA PLACEMENT Right 10/05/2015   Procedure: INSERTION OF  ARTERIOVENOUS (AV) GORE-TEX GRAFT RIGHT ARM;  Surgeon: Conrad Breckinridge, MD;  Location: [place];  Service: Vascular;  Laterality: Right;  . BASCILIC VEIN TRANSPOSITION Right 04/13/2015   Procedure: RIGHT FIRST STAGE BASCILIC VEIN TRANSPOSITION;  Surgeon: Conrad [place], MD;  Location: [place];  Service: Vascular;  Laterality: Right;  . BASCILIC VEIN TRANSPOSITION Right 07/11/2015   Procedure: SECOND  STAGE BASILIC VEIN TRANSPOSITION;  Surgeon: Conrad Savage, MD;  Location: [place];  Service: Vascular;  Laterality: Right;  . BREAST SURGERY Left   . CARDIAC CATHETERIZATION [name] 10/19/2014   Procedure: Right/Left Heart Cath and Coronary Angiography;  Surgeon: Peter M Martinique, MD;  Location: [place] CV [facility];  Service: Cardiovascular;  Laterality: [name];  . FISTULA SUPERFICIALIZATION Right 01/26/2015   Procedure: Right BRACHIOCEPHALIC ARTERIOVENOUS FISTULA SUPERFICIALIZATION with side branch ligation;  Surgeon: Conrad Merlin, MD;  Location: [place];  Service: Vascular;  Laterality: Right;  . IR DIALY SHUNT INTRO [place] W/IMG RIGHT Right 08/23/2016  . IR GENERIC HISTORICAL  04/30/2016   IR REMOVAL TUN CV CATH W/O FL 04/30/2016 Aletta Edouard, MD [facility]-INTERV RAD  . IR US GUIDE VASC ACCESS RIGHT  08/23/2016  . LIGATION OF ARTERIOVENOUS  FISTULA Right 03/07/2015   Procedure: LIGATION OF RIGHT BRACHIOCEPHALIC ARTERIOVENOUS  FISTULA;  Surgeon: Conrad Ohlman, MD;  Location: [place];  Service: Vascular;  Laterality: Right;  . LIGATION OF ARTERIOVENOUS  FISTULA Right 10/05/2015   Procedure: LIGATION OF RIGHT BASILIC VEIN TRANSPOSITION; EXCISION OF CICATRIX;  Surgeon: Conrad [place]boro, MD;  Location: [place];  Service: Vascular;  Laterality: Right;  . PERIPHERAL VASCULAR CATHETERIZATION [name] 01/24/2015   Procedure: Fistulagram;  Surgeon: Conrad , MD;  Location: [place] [facility];  Service: Cardiovascular;  Laterality: [name];  . PERIPHERAL VASCULAR CATHETERIZATION [name] 08/29/2015   Procedure: Fistulagram;  Surgeon:  Conrad Spanaway, MD;  Location: [place] [facility];  Service: Cardiovascular;  Laterality: [name];  . PERIPHERAL VASCULAR CATHETERIZATION Right 08/29/2015   Procedure: Peripheral Vascular Balloon Angioplasty;  Surgeon: Conrad Pickens, MD;  Location: [place] [facility];  Service: Cardiovascular;  Laterality: Right;  arm fistula  . TEE WITHOUT CARDIOVERSION [name] 12/27/2014   Procedure: TRANSESOPHAGEAL  ECHOCARDIOGRAM (TEE);  Surgeon: Thayer Headings, MD;  Location: [place];  Service: Cardiovascular;  Laterality: [name];  . THROMBECTOMY W/ EMBOLECTOMY Right 03/07/2015   Procedure: THROMBECTOMY OF RIGHT BRACHIOCEPHALIC ARTERIOVENOUS FISTULA;  Surgeon: Conrad Downs, MD;  Location: [place];  Service: Vascular;  Laterality: Right;  . THROMBECTOMY W/ EMBOLECTOMY Right 01/27/2016   Procedure: THROMBECTOMY AND REVISION OF RIGHT UPPER ARM ARTERIOVENOUS GORE-TEX GRAFT;  Surgeon: Angelia Mould, MD;  Location: [place];  Service: Vascular;  Laterality: Right;    There were no vitals filed for this visit.      Subjective Assessment - 11/21/16 0925    Subjective  Patient reports her doctor suggested she come back for therapy due to her arm edema and having a feeling of a "catch" in the left arm at times.  Radiates to left shoulder and under the arm.     Patient Stated Goals Patient reports she wants the pain to go away and to do as much as she can.    Currently in Pain? Yes   Pain Score 8    Pain Location Arm   Pain Orientation Left   Pain Descriptors / Indicators Spasm;Cramping   Pain Type Chronic pain   Pain Onset More than a month ago   Pain Frequency Intermittent   Multiple Pain Sites No           [facility] OT Assessment - 11/22/16 1128      Assessment   Diagnosis left lymphedema UE   Referring Provider Son[place]   Onset Date 08/28/12   Prior Therapy briefly in 2017     Balance Screen   Has the patient fallen in the past 6 months No   Has the patient had a decrease in activity level because of a fear of falling?  No     Home  Environment   Family/patient expects to be discharged to: Private residence   Living Arrangements Children   Available Help at Discharge Family   Type of [place] One level   Alternate Level Stairs - Number of Steps 20 to enter   Bathroom Shower/Tub Tub/Shower unit;Curtain   Print production planner - 2 wheels;Shower seat;Grab bars - tub/shower;Bedside commode   Lives With Daughter     Prior Function   Vocation Retired     ADL   Eating/Feeding Modified independent   Grooming Modified independent   Upper Body Bathing Modified independent   Lower Body Bathing Increased time   Upper Body Dressing Independent   Lower Body Dressing Modified independent   Toilet Transfer Modified independent   Toileting - Clothing Manipulation Modified independent   Tub/Shower Transfer Supervision/safety   ADL comments Patient is modified independent with basic self care tasks, she does require occasional assistance with bathing, she is able to make her bed and straighten her room and occasional vacuum with rest breaks otherwise      IADL   Prior Level of Function Shopping independent   Shopping Shops independently for small purchases   Prior Level of Function Light  Housekeeping independent   Light Housekeeping Performs light daily tasks but cannot maintain acceptable level of cleanliness   Prior Level of Function Meal Prep independent   Meal Prep Prepares adequate meal if supplied with ingredients   Actor independently on public transportation   Medication Management Is responsible for taking medication in correct dosages at correct time     Mobility   Mobility Status Independent     Written Expression   Dominant Hand Right     ROM / Strength   AROM / PROM / Strength AROM;Strength     AROM   Overall AROM Comments ROM WFLs for BUEs, some tightness noted with external rotation on left.      Strength   Overall Strength Comments strength 4/5 overall          LYMPHEDEMA/ONCOLOGY QUESTIONNAIRE - 11/21/16 0937      Type   Cancer Type left breast cancer     Surgeries   Mastectomy Date 09/11/12   Number Lymph Nodes Removed 8     Date Lymphedema/Swelling Started   Date 06/15/14     Treatment   Past Chemotherapy Treatment Yes   Past Radiation  Treatment Yes     What other symptoms do you have   Are you Having Heaviness or Tightness Yes   Are you having Pain Yes   Are you having pitting edema Yes   Body Site left arm and chest   Do you have infections No   Other Symptoms some numbness and tingling at times     Right Upper Extremity Lymphedema   15 cm Proximal to Olecranon Process 56.5 cm   10 cm Proximal to Olecranon Process 54.3 cm   Olecranon Process 30.6 cm   15 cm Proximal to Ulnar Styloid Process 31.5 cm   10 cm Proximal to Ulnar Styloid Process 26.8 cm   Just Proximal to Ulnar Styloid Process 18 cm   Across Hand at [employer] 22 cm   At [place] of 2nd Digit 7.6 cm   At [facility] of Thumb 7.4 cm     Left Upper Extremity Lymphedema   15 cm Proximal to Olecranon Process 52.8 cm   10 cm Proximal to Olecranon Process 52.6 cm   Olecranon Process 31.7 cm   15 cm Proximal to Ulnar Styloid Process 31.7 cm   10 cm Proximal to Ulnar Styloid Process 29.1 cm   Just Proximal to Ulnar Styloid Process 20.2 cm   Across Hand at [employer] 23.8 cm   At [place] of 2nd Digit 8.5 cm   At [facility] of Thumb 8.1 cm       Patient with jovi pak in place this date on left side.  Pt has had issues with low BP at times during dialysis, she is limited to 32 oz fluid a day.  Discussed medical history and impact on compression bandaging and will discuss further with MD to determine if light compression initially and slow decongestion of the left arm and hand will be appropriate for patient prior to being fitted for compression garments. Patient demonstrates understanding.                 OT Education - 11/22/16 1121    Education provided Yes   Education Details HEP, goals, plan of care   Person(s) Educated Patient   Methods Explanation   Comprehension Verbalized understanding  OT Long Term Goals - 11/22/16 1142      OT LONG TERM GOAL #1   Title Pt and family be ind in wearing appropriate compression  garments to decrease circumferences in L UE lymphedema to decrease risk for infection    Baseline no current compression garments for arm or hand.    Time 6   Period Weeks   Status New   Target Date 01/02/17     OT LONG TERM GOAL #2   Title Patient to demonstrate understanding of wear, care and schedule of compression garments for LUE with modified independence.    Baseline none   Time 6   Period Weeks   Status New   Target Date 01/02/17     OT LONG TERM GOAL #3   Title Patient will demonstrate reduction in circumferential measurements in left UE from hand to elbow by 2 cm at each level to reduce risk of infection and manage symptoms of lymphedema.    Baseline see flowsheet   Time 6   Period Weeks   Status New   Target Date 01/02/17               Plan - 11/22/16 1123    Clinical Impression Statement Pt is a 76 yo female with a history of breast cancer in May 2014 with 33 radiation sessions and 4 rounds of chemo, 9 lymph nodes removed.  She has a jovi pak which she wears most days and a isontoner glove with tubigrip for her arm but has not been fitted for a compression garment in the past.  She has a complex medical history which also includes CHF, cardiac issues, ESRD and COPD which may impact the possiblility of a comprehensive lymphedema program to include compression bandaging.  Will discuss with cardiologist and determine further plan of care with compression.  Patient presents with LUE lymphedema in arm and trunk, recent increases in circumferential measurements up to 3 cm in select areas of LUE.  She demonstrates decreased strength, tightness with external rotation and demonstrates increased difficulty with self care and home management tasks.  Patient will benefit from skilled OT to address and manage symptoms of lymphedema.     Occupational Profile and client history currently impacting functional performance age, co morbidities, history of CHF, cardiac issues, ESRD, COPD,  dialysis is 4 days a week M, T, Th, S. Low BP at times during dialysis.  Limited to 32 oz of fluid per day.     Occupational performance deficits (Please refer to evaluation for details): ADL's;IADL's;Rest and Sleep;Leisure   Rehab Potential Good   Current Impairments/barriers affecting progress: dialysis 4 times a week, medical history   OT Frequency 3x / week   OT Duration 6 weeks   OT Treatment/Interventions Manual lymph drainage;Therapeutic exercise;Passive range of motion;Patient/family education;Self-care/ADL training;Compression bandaging   Consulted and Agree with Plan of Care Patient      Patient will benefit from skilled therapeutic intervention in order to improve the following deficits and impairments:  Impaired UE functional use, Impaired sensation, Impaired flexibility, Decreased strength, Decreased range of motion, Decreased activity tolerance, Increased edema, Pain  Visit Diagnosis: Postmastectomy lymphedema syndrome  Pain in left arm      G-Codes - Dec 11, 2016 1127    Functional Assessment Tool Used (Outpatient only) circumference of L UE , ROM , pain    Functional Limitation Self care   Self Care Current Status (Y1856) At least 40 percent but less than 60 percent impaired, limited or restricted  Self Care Goal Status [phone]) At least 20 percent but less than 40 percent impaired, limited or restricted      Problem List Patient Active Problem List   Diagnosis Date Noted  . Paresthesia of left arm 11/09/2016  . Chronic thoracic back pain 11/09/2016  . Personal history of breast cancer 08/26/2016  . Atypical chest pain 08/03/2016  . Chronic diastolic CHF (congestive heart failure) ([place]) 05/11/2015  . Loose stools 01/13/2015  . Skin irritation 01/13/2015  . Rib pain on left side 01/13/2015  . Enterococcal bacteremia 12/26/2014  . Bacteremia   . UTI (lower urinary tract infection) 12/23/2014  . Acute encephalopathy 12/23/2014  . Systolic CHF, chronic ([place])  12/23/2014  . Encephalopathy, metabolic 96/28/3662  . Lymphedema of left arm 12/08/2014  . OSA (obstructive sleep apnea) 12/08/2014  . Constipation 12/08/2014  . Nonischemic cardiomyopathy ([place]) 11/10/2014  . Acute on chronic systolic heart failure, NYHA class 3 ([place]) 11/10/2014  . Morbidly obese ([place]) 11/10/2014  . ESRD (end stage renal disease) on dialysis ([place])   . Type 2 diabetes mellitus with diabetic nephropathy ([place])   . Essential hypertension   . Left hip pain   . NSTEMI (non-ST elevated myocardial infarction) ([place]) 10/13/2014  . Pulmonary HTN ([place]) 10/13/2014  . Syncope 10/12/2014  . LBBB (left bundle branch block) 10/12/2014  . Breast cancer ([place])   . Hypertension   . Arthritis   . COPD (chronic obstructive pulmonary disease) ([place])    Holly Pring T Zanyiah Posten, OTR/L, CLT  Braniya Farrugia 11/22/2016, 11:46 AM  [facility] [address], [place], [address] Phone: [phone]   Fax:  [phone]  Name: Lexxie Winberg [name] MRN: [id number] Date of Birth: 09-20-1940

## 2016-11-29 ENCOUNTER — Telehealth: Payer: Self-pay | Admitting: *Deleted

## 2016-11-29 NOTE — Telephone Encounter (Signed)
Are we able to put these on a CD?

## 2016-11-29 NOTE — Telephone Encounter (Signed)
Patient has requested a copy of her MRI and Xray Imaging and report Pt will need this by Monday 12/03/16 Pt contact [phone]

## 2016-11-29 NOTE — Telephone Encounter (Signed)
Patient notified and will pick this up monday

## 2016-11-29 NOTE — Telephone Encounter (Signed)
Please inform patient that we can not copy any imaging unless it has be done at our office. She will need to go to the facility she had it done at or the medical records building. I can make a copy of her XRAY's that she had done here. CD will be made and placed in folder up front.

## 2016-12-03 ENCOUNTER — Encounter: Payer: Self-pay | Admitting: Family Medicine

## 2016-12-03 ENCOUNTER — Telehealth: Payer: Self-pay | Admitting: Family Medicine

## 2016-12-03 DIAGNOSIS — S46819A Strain of other muscles, fascia and tendons at shoulder and upper arm level, unspecified arm, initial encounter: Secondary | ICD-10-CM | POA: Insufficient documentation

## 2016-12-03 NOTE — Telephone Encounter (Signed)
Two envelopes where in folder at front desk, informed patient they are at front desk

## 2016-12-03 NOTE — Telephone Encounter (Signed)
PT daughter came in pick up a prescription for diabetic shoes. Not up front. Please advise, thank you!

## 2016-12-12 ENCOUNTER — Ambulatory Visit: Payer: Medicare Other | Admitting: Occupational Therapy

## 2016-12-12 DIAGNOSIS — I972 Postmastectomy lymphedema syndrome: Secondary | ICD-10-CM | POA: Diagnosis not present

## 2016-12-12 NOTE — Patient Instructions (Signed)
Pt was provided new small isotoner glove and tubigrip E for hand and forearm to use until she receive daytime over the counter compression sleeve and glove  Cont with [employer]  And stop at [facility] and they can measure her for prosthesis , bra's and over the counter compression sleeve and glove for L arm  She can do pect stretches in doorway until PT appt for back spasm next week

## 2016-12-12 NOTE — Therapy (Signed)
[facility] [address], [place], [address] Phone: [phone]   Fax:  [phone]  Occupational Therapy Treatment  Patient Details  Name: Jordan [name] MRN: [id number] Date of Birth: [birth year] Referring Provider: Bennetta Laos  Encounter Date: 12/12/2016      OT End of Session - 12/12/16 1143    Visit Number 2   Number of Visits 24   Date for OT Re-Evaluation 01/02/17   OT Start Time 1100   OT Stop Time 1126   OT Time Calculation (min) 26 min   Activity Tolerance Patient tolerated treatment well   Behavior During Therapy [facility] for tasks assessed/performed      Past Medical History:  Diagnosis Date  . Arthritis   . Breast cancer ([place])   . CAD (coronary artery disease)    a. minimal CAD by cath in 10/2014.  Marland Kitchen CKD (chronic kidney disease)   . COPD (chronic obstructive pulmonary disease) ([place])   . Diabetes mellitus with ESRD (end-stage renal disease) ([place])   . GERD (gastroesophageal reflux disease)   . Hypertension   . Renal disorder    Family reports acute renal failure  . Sleep apnea    does not wear CPAP  . Systolic CHF, chronic ([place]) 12/23/2014    Past Surgical History:  Procedure Laterality Date  . ARTERIOVENOUS GRAFT PLACEMENT Right 10/05/15  . AV FISTULA PLACEMENT Right 10/26/2014   Procedure: Right Brachiocephalic Arteriovenous FISTULA CREATION;  Surgeon: Conrad L[place] Grove, MD;  Location: [place];  Service: Vascular;  Laterality: Right;  . AV FISTULA PLACEMENT Right 10/05/2015   Procedure: INSERTION OF  ARTERIOVENOUS (AV) GORE-TEX GRAFT RIGHT ARM;  Surgeon: Conrad Belleview, MD;  Location: [place];  Service: Vascular;  Laterality: Right;  . BASCILIC VEIN TRANSPOSITION Right 04/13/2015   Procedure: RIGHT FIRST STAGE BASCILIC VEIN TRANSPOSITION;  Surgeon: Conrad [place], MD;  Location: [place];  Service: Vascular;  Laterality: Right;  . BASCILIC VEIN TRANSPOSITION Right 07/11/2015   Procedure: SECOND  STAGE BASILIC VEIN TRANSPOSITION;  Surgeon: Conrad Collins, MD;  Location: [place];  Service: Vascular;  Laterality: Right;  . BREAST SURGERY Left   . CARDIAC CATHETERIZATION [name] 10/19/2014   Procedure: Right/Left Heart Cath and Coronary Angiography;  Surgeon: Peter M Martinique, MD;  Location: [place] [facility];  Service: Cardiovascular;  Laterality: [name];  . FISTULA SUPERFICIALIZATION Right 01/26/2015   Procedure: Right BRACHIOCEPHALIC ARTERIOVENOUS FISTULA SUPERFICIALIZATION with side branch ligation;  Surgeon: Conrad Panama, MD;  Location: [place];  Service: Vascular;  Laterality: Right;  . IR DIALY SHUNT INTRO [place] W/IMG RIGHT Right 08/23/2016  . IR GENERIC HISTORICAL  04/30/2016   IR REMOVAL TUN CV CATH W/O FL 04/30/2016 Aletta Edouard, MD [facility]-INTERV RAD  . IR US GUIDE VASC ACCESS RIGHT  08/23/2016  . LIGATION OF ARTERIOVENOUS  FISTULA Right 03/07/2015   Procedure: LIGATION OF RIGHT BRACHIOCEPHALIC ARTERIOVENOUS  FISTULA;  Surgeon: Conrad Dixon, MD;  Location: [place];  Service: Vascular;  Laterality: Right;  . LIGATION OF ARTERIOVENOUS  FISTULA Right 10/05/2015   Procedure: LIGATION OF RIGHT BASILIC VEIN TRANSPOSITION; EXCISION OF CICATRIX;  Surgeon: Conrad Baker, MD;  Location: [place];  Service: Vascular;  Laterality: Right;  . PERIPHERAL VASCULAR CATHETERIZATION [name] 01/24/2015   Procedure: Fistulagram;  Surgeon: Conrad Ridgeville, MD;  Location: [place] [facility];  Service: Cardiovascular;  Laterality: [name];  . PERIPHERAL VASCULAR CATHETERIZATION [name] 08/29/2015   Procedure: Fistulagram;  Surgeon:  Conrad Parshall, MD;  Location: [place] [facility];  Service: Cardiovascular;  Laterality: [name];  . PERIPHERAL VASCULAR CATHETERIZATION Right 08/29/2015   Procedure: Peripheral Vascular Balloon Angioplasty;  Surgeon: Conrad Gilmanton, MD;  Location: [place] [facility];  Service: Cardiovascular;  Laterality: Right;  arm fistula  . TEE WITHOUT CARDIOVERSION [name] 12/27/2014   Procedure: TRANSESOPHAGEAL  ECHOCARDIOGRAM (TEE);  Surgeon: Thayer Headings, MD;  Location: [place];  Service: Cardiovascular;  Laterality: [name];  . THROMBECTOMY W/ EMBOLECTOMY Right 03/07/2015   Procedure: THROMBECTOMY OF RIGHT BRACHIOCEPHALIC ARTERIOVENOUS FISTULA;  Surgeon: Conrad Calverton, MD;  Location: [place];  Service: Vascular;  Laterality: Right;  . THROMBECTOMY W/ EMBOLECTOMY Right 01/27/2016   Procedure: THROMBECTOMY AND REVISION OF RIGHT UPPER ARM ARTERIOVENOUS GORE-TEX GRAFT;  Surgeon: Angelia Mould, MD;  Location: [place];  Service: Vascular;  Laterality: Right;    There were no vitals filed for this visit.      Subjective Assessment - 12/12/16 1139    Subjective  Pt report she do wear her jovipak breast pad about 3-4 days a week when she has dialysis and sleeve about 3-4 days - 4-5 hrs a day - ask about prosthesis - never got any - and then she seen ortho MD about catch and pain in R lower back and over pect on L when sleeping on her R side    Patient Stated Goals Patient reports she wants the pain to go away and to do as much as she can.    Currently in Pain? Yes   Pain Score 6    Pain Location Back   Pain Orientation Right   Pain Descriptors / Indicators Aching;Spasm   Pain Type Chronic pain      Pt arrive with jovipak breast pad in place   pt as about prosthesis for L breast - report she never had some  Refer to [facility] for prosthesis and bras   Assess pt's circumference from eval date to 2016 numbers   bilateral UE increase  Pt report she did gain more than 10 lbs  And on dialysis 4 x week  On further assessment - pt report she never got over the counter compression sleeve and glove in 2016 - had [place] insurance  Was using still tubigrip and Environmental manager provided by this OT   pt refer to get daytime compression sleeve and glove at [employer] too  Will assess fit during one of the PT appt  Pt has PT eval next week for back pain R side - pt do report cramping in pect on L when  sleeping on R side - pt has rounded shoulders - sit a lot and use walker   will discuss with PT - can address that during rehab for posture and rounded shoulders                         OT Education - 12/12/16 1143    Education provided Yes   Education Details wearing of compression garments , pect stretch    Person(s) Educated Patient   Methods Explanation;Demonstration;Tactile cues;Verbal cues;Handout   Comprehension Returned demonstration;Verbalized understanding             OT Long Term Goals - 11/22/16 1142      OT LONG TERM GOAL #1   Title Pt and family be ind in wearing appropriate compression garments to decrease circumferences in L UE lymphedema to decrease risk for infection  Baseline no current compression garments for arm or hand.    Time 6   Period Weeks   Status New   Target Date 01/02/17     OT LONG TERM GOAL #2   Title Patient to demonstrate understanding of wear, care and schedule of compression garments for LUE with modified independence.    Baseline none   Time 6   Period Weeks   Status New   Target Date 01/02/17     OT LONG TERM GOAL #3   Title Patient will demonstrate reduction in circumferential measurements in left UE from hand to elbow by 2 cm at each level to reduce risk of infection and manage symptoms of lymphedema.    Baseline see flowsheet   Time 6   Period Weeks   Status New   Target Date 01/02/17               Plan - 12/12/16 1144    Clinical Impression Statement Pt was seen by this OT/CLT end of 2016 - she was recommended to get over the counter compression sleeve and glove for L arm and jovipak - she did get jovipak breast pad that she is wearing 4 days a week - but found out this date she never got the sleeve - was wearing the isotoner glove and Tubigrip D for hand and forearm that I provided her for that meantime - pt measurements increase compare to 2016 but bilateral - pt report she did gain more than 10  lbs and on dialysis 3 -4 days a week - pt ask about prosthesis for L breast - she never had any - pt refer to [facility] for prosthesis , bras and over the counter compression sleeve and glove - new isotoner glove and tubgrip provided to wear on L hand and forearm in meantime - pt did see ortho for R side back pain and  refer to PT - will discuss with PT pt report cramping at L pect - that can be adress when back spasm is adress  -pt sit more with being on dialysis, using walker  - rounder shoulders - will follow up on pt when she is coming in for PT appt to check on daytime compression sleeve and glove    Occupational performance deficits (Please refer to evaluation for details): ADL's;IADL's;Rest and Sleep;Leisure   Rehab Potential Good   Current Impairments/barriers affecting progress: dialysis 4 times a week, medical history   OT Frequency 1x / week   OT Duration 4 weeks   OT Treatment/Interventions Manual lymph drainage;Therapeutic exercise;Passive range of motion;Patient/family education;Self-care/ADL training;Compression bandaging   Plan assess compression sleeve and glove during one of PT appt    OT Home Exercise Plan see pt instructions    Consulted and Agree with Plan of Care Patient      Patient will benefit from skilled therapeutic intervention in order to improve the following deficits and impairments:  Impaired UE functional use, Impaired sensation, Impaired flexibility, Decreased strength, Decreased range of motion, Decreased activity tolerance, Increased edema, Pain  Visit Diagnosis: Postmastectomy lymphedema syndrome    Problem List Patient Active Problem List   Diagnosis Date Noted  . Paresthesia of left arm 11/09/2016  . Chronic thoracic back pain 11/09/2016  . Personal history of breast cancer 08/26/2016  . Atypical chest pain 08/03/2016  . Chronic diastolic CHF (congestive heart failure) ([place]) 05/11/2015  . Loose stools 01/13/2015  . Skin irritation 01/13/2015   . Rib pain on left side 01/13/2015  .  Enterococcal bacteremia 12/26/2014  . Bacteremia   . UTI (lower urinary tract infection) 12/23/2014  . Acute encephalopathy 12/23/2014  . Systolic CHF, chronic ([place]) 12/23/2014  . Encephalopathy, metabolic 68/11/8108  . Lymphedema of left arm 12/08/2014  . OSA (obstructive sleep apnea) 12/08/2014  . Constipation 12/08/2014  . Nonischemic cardiomyopathy ([place]) 11/10/2014  . Acute on chronic systolic heart failure, NYHA class 3 ([place]) 11/10/2014  . Morbidly obese ([place]) 11/10/2014  . ESRD (end stage renal disease) on dialysis ([employer])   . Type 2 diabetes mellitus with diabetic nephropathy ([place])   . Essential hypertension   . Left hip pain   . NSTEMI (non-ST elevated myocardial infarction) ([place]) 10/13/2014  . Pulmonary HTN ([place]) 10/13/2014  . Syncope 10/12/2014  . LBBB (left bundle branch block) 10/12/2014  . Breast cancer ([place])   . Hypertension   . Arthritis   . COPD (chronic obstructive pulmonary disease) ([facility])     Gery Sabedra OTR/L,CLT 12/12/2016, 11:50 AM  [facility] [address], [place], [address] Phone: [phone]   Fax:  [phone]  Name: Tawsha Terrero [name] MRN: [id number] Date of Birth: 12/20/1940

## 2016-12-19 ENCOUNTER — Ambulatory Visit: Payer: Medicare Other | Attending: Orthopedic Surgery | Admitting: Physical Therapy

## 2016-12-19 ENCOUNTER — Encounter: Payer: Self-pay | Admitting: Physical Therapy

## 2016-12-19 DIAGNOSIS — M6281 Muscle weakness (generalized): Secondary | ICD-10-CM

## 2016-12-19 DIAGNOSIS — M546 Pain in thoracic spine: Secondary | ICD-10-CM | POA: Diagnosis not present

## 2016-12-19 DIAGNOSIS — M62838 Other muscle spasm: Secondary | ICD-10-CM

## 2016-12-19 LAB — HM DIABETES EYE EXAM

## 2016-12-20 NOTE — Therapy (Signed)
[facility] [address] [place], [address] Phone: [phone]   Fax:  [phone]  Physical Therapy Evaluation  Patient Details  Name: Jordan [name] MRN: [id number] Date of Birth: 24-Oct-1940 Referring Provider: Carlynn Spry PA-C  Encounter Date: 12/19/2016      PT End of Session - 12/19/16 1600    Visit Number 1   Number of Visits 12   Date for PT Re-Evaluation 01/30/17   Authorization Type 1   Authorization Time Period 10 G codes   PT Start Time 1350   PT Stop Time 1445   PT Time Calculation (min) 55 min   Activity Tolerance Patient tolerated treatment well;Patient limited by pain   Behavior During Therapy [facility] for tasks assessed/performed      Past Medical History:  Diagnosis Date  . Arthritis   . Breast cancer ([place])   . CAD (coronary artery disease)    a. minimal CAD by cath in 10/2014.  Marland Kitchen CKD (chronic kidney disease)   . COPD (chronic obstructive pulmonary disease) ([place])   . Diabetes mellitus with ESRD (end-stage renal disease) ([place])   . GERD (gastroesophageal reflux disease)   . Hypertension   . Renal disorder    Family reports acute renal failure  . Sleep apnea    does not wear CPAP  . Systolic CHF, chronic ([place]) 12/23/2014    Past Surgical History:  Procedure Laterality Date  . ARTERIOVENOUS GRAFT PLACEMENT Right 10/05/15  . AV FISTULA PLACEMENT Right 10/26/2014   Procedure: Right Brachiocephalic Arteriovenous FISTULA CREATION;  Surgeon: Conrad [place], MD;  Location: [place];  Service: Vascular;  Laterality: Right;  . AV FISTULA PLACEMENT Right 10/05/2015   Procedure: INSERTION OF  ARTERIOVENOUS (AV) GORE-TEX GRAFT RIGHT ARM;  Surgeon: Conrad Calio, MD;  Location: [place];  Service: Vascular;  Laterality: Right;  . BASCILIC VEIN TRANSPOSITION Right 04/13/2015   Procedure: RIGHT FIRST STAGE BASCILIC VEIN TRANSPOSITION;  Surgeon: Conrad Tiawah, MD;  Location: [place];  Service: Vascular;   Laterality: Right;  . BASCILIC VEIN TRANSPOSITION Right 07/11/2015   Procedure: SECOND STAGE BASILIC VEIN TRANSPOSITION;  Surgeon: Conrad [place], MD;  Location: [place];  Service: Vascular;  Laterality: Right;  . BREAST SURGERY Left   . CARDIAC CATHETERIZATION [name] 10/19/2014   Procedure: Right/Left Heart Cath and Coronary Angiography;  Surgeon: Peter M Martinique, MD;  Location: [place] [facility];  Service: Cardiovascular;  Laterality: [name];  . FISTULA SUPERFICIALIZATION Right 01/26/2015   Procedure: Right BRACHIOCEPHALIC ARTERIOVENOUS FISTULA SUPERFICIALIZATION with side branch ligation;  Surgeon: Conrad [place], MD;  Location: [place];  Service: Vascular;  Laterality: Right;  . IR DIALY SHUNT INTRO [place] W/IMG RIGHT Right 08/23/2016  . IR GENERIC HISTORICAL  04/30/2016   IR REMOVAL TUN CV CATH W/O FL 04/30/2016 Aletta Edouard, MD [facility]-INTERV RAD  . IR US GUIDE VASC ACCESS RIGHT  08/23/2016  . LIGATION OF ARTERIOVENOUS  FISTULA Right 03/07/2015   Procedure: LIGATION OF RIGHT BRACHIOCEPHALIC ARTERIOVENOUS  FISTULA;  Surgeon: Conrad [place], MD;  Location: [place];  Service: Vascular;  Laterality: Right;  . LIGATION OF ARTERIOVENOUS  FISTULA Right 10/05/2015   Procedure: LIGATION OF RIGHT BASILIC VEIN TRANSPOSITION; EXCISION OF CICATRIX;  Surgeon: Conrad [place], MD;  Location: [place];  Service: Vascular;  Laterality: Right;  . PERIPHERAL VASCULAR CATHETERIZATION [name] 01/24/2015   Procedure: Fistulagram;  Surgeon: Conrad Dousman, MD;  Location: [place] [facility];  Service: Cardiovascular;  Laterality: [name];  . PERIPHERAL VASCULAR CATHETERIZATION [name] 08/29/2015   Procedure: Fistulagram;  Surgeon: Conrad [place], MD;  Location: [facility] [facility];  Service: Cardiovascular;  Laterality: [name];  . PERIPHERAL VASCULAR CATHETERIZATION Right 08/29/2015   Procedure: Peripheral Vascular Balloon Angioplasty;  Surgeon: Conrad [place], MD;  Location: [place] [facility];  Service: Cardiovascular;  Laterality: Right;  arm  fistula  . TEE WITHOUT CARDIOVERSION [name] 12/27/2014   Procedure: TRANSESOPHAGEAL ECHOCARDIOGRAM (TEE);  Surgeon: Thayer Headings, MD;  Location: [place];  Service: Cardiovascular;  Laterality: [name];  . THROMBECTOMY W/ EMBOLECTOMY Right 03/07/2015   Procedure: THROMBECTOMY OF RIGHT BRACHIOCEPHALIC ARTERIOVENOUS FISTULA;  Surgeon: Conrad Fries, MD;  Location: [place];  Service: Vascular;  Laterality: Right;  . THROMBECTOMY W/ EMBOLECTOMY Right 01/27/2016   Procedure: THROMBECTOMY AND REVISION OF RIGHT UPPER ARM ARTERIOVENOUS GORE-TEX GRAFT;  Surgeon: Angelia Mould, MD;  Location: [place];  Service: Vascular;  Laterality: Right;    There were no vitals filed for this visit.       Subjective Assessment - 12/19/16 1414    Subjective Patient reports pain in right posteror back, rib region that feels like a spasm with lying and when she gets up out of bed. Better with sitting in chair; recliner.    Pertinent History April 2018 reports beginning to have pain in right thoracic spine no apparent reason. Pain has stayed about the same as when it began. Nothing makes it better or worse.    Limitations Walking;Standing;Other (comment)  lying   How long can you sit comfortably? no problem   How long can you stand comfortably? unable to stand <15 min.   How long can you walk comfortably? short distances only   Patient Stated Goals improve abiltiy to walk and perform daily tasks with less pain   Currently in Pain? Other (Comment)  ranges 1/10 up to 9/10 when aggravated   Aggravating Factors  walking, moving, lying down   Pain Relieving Factors sitting in recliner   Effect of Pain on Daily Activities inconsistent with pain and unable to perform daily activities without increased pain            [facility] PT Assessment - 12/20/16 0001      Assessment   Medical Diagnosis strain of muslces right shouler, arm; pain in thoracic spine   Referring Provider Carlynn Spry PA-C   Onset Date/Surgical  Date 07/15/16   Hand Dominance Right   Prior Therapy none     Precautions   Precautions Fall   Precaution Comments using walker for ambulation     Balance Screen   Has the patient fallen in the past 6 months No     [place] residence   Living Arrangements Children;Other relatives  daughter and granddaughter   Type of [place] to enter   Entrance Stairs-Number of Steps [place] One level   [place] - 2 wheels;Tub bench;Grab bars - tub/shower;Hand held shower head     Prior Function   Level of Independence Independent with household mobility with device;Independent with community mobility with device;Independent with gait   Vocation Retired   Leisure spend time with family     Cognition   Overall Cognitive Status Within Functional Limits for tasks assessed     Observation/Other Assessments   Modified [place] 50% self perceived disabiltiy  Posture/Postural Control   Posture Comments increased thoracic kyphosis, rounded shoulders     AROM   Overall AROM Comments shoulder AROM WNL without reproduction of pain            Objective measurements completed on examination: See above findings.                  PT Education - 12/19/16 1530    Education provided Yes   Education Details POC, posture awareness, scapular retraction   Person(s) Educated Patient   Methods Explanation;Demonstration;Verbal cues;Handout   Comprehension Verbalized understanding;Returned demonstration;Verbal cues required             PT Long Term Goals - 12/19/16 2204      PT LONG TERM GOAL #1   Title Patient will improve MODI to 40% or better to demonstrate improved pain level and function with ADL's with less difficulty   Baseline MODI 50%   Status New   Target Date 01/10/17     PT LONG TERM GOAL #2   Title Patient will improve MODI to 30% or  better to demonstrate improved pain level and function with ADL's with less difficulty   Baseline MODI 50%   Status New   Target Date 01/30/17     PT LONG TERM GOAL #3   Title Independent with home exercise program for pain control, posture awareness and exercises to allow transistion to self managment following discharge from physical therapy   Baseline limited knowledge   Status New   Target Date 01/30/17                Plan - 12/19/16 1700    Clinical Impression Statement Patient is a 76 year old right hand dominant female who presents with right sided mid thoracic back pain, upper trapezius spasms and limited abiltiy to perform standing and walking activities due to pain. Her MODI score of 50% indicated severe self perceived disabiltiy. She has limited knoweldge of appropriate pain control strategies and exercises, posture awareness and will benefit from physical therapyi intervention to achieve goals.    History and Personal Factors relevant to plan of care: pain began April 2018 for no apparent reason with pain about the same as when it began. She is on dialysis and has lymphedema left UE and has has surgery on right UE with stents due to complications.    Clinical Presentation Stable   Clinical Presentation due to: no change since pain began   Clinical Decision Making Low   Rehab Potential Fair   Clinical Impairments Affecting Rehab Potential (+)motivated, (-)multiple co morbidities   PT Frequency 1x / week   PT Duration 6 weeks   PT Treatment/Interventions Moist Heat;Electrical Stimulation;Patient/family education;Neuromuscular re-education;Therapeutic exercise;Manual techniques   PT Next Visit Plan pain control, exercies   PT Home Exercise Plan posture awareness   Consulted and Agree with Plan of Care Patient      Patient will benefit from skilled therapeutic intervention in order to improve the following deficits and impairments:  Decreased strength, Impaired perceived  functional ability, Increased muscle spasms, Impaired UE functional use, Pain, Difficulty walking  Visit Diagnosis: Pain in thoracic spine - Plan: PT plan of care cert/re-cert  Other muscle spasm - Plan: PT plan of care cert/re-cert  Muscle weakness (generalized) - Plan: PT plan of care cert/re-cert      G-Codes - 27/78/24 1700    Functional Assessment Tool Used (Outpatient Only) MODI, pain, strength, clinical judgment   Functional Limitation Mobility: Walking  and moving around   Mobility: Walking and Moving Around Current Status [phone]) At least 40 percent but less than 60 percent impaired, limited or restricted   Mobility: Walking and Moving Around Goal Status [phone]) At least 20 percent but less than 40 percent impaired, limited or restricted       Problem List Patient Active Problem List   Diagnosis Date Noted  . Paresthesia of left arm 11/09/2016  . Chronic thoracic back pain 11/09/2016  . Personal history of breast cancer 08/26/2016  . Atypical chest pain 08/03/2016  . Chronic diastolic CHF (congestive heart failure) ([place]) 05/11/2015  . Loose stools 01/13/2015  . Skin irritation 01/13/2015  . Rib pain on left side 01/13/2015  . Enterococcal bacteremia 12/26/2014  . Bacteremia   . UTI (lower urinary tract infection) 12/23/2014  . Acute encephalopathy 12/23/2014  . Systolic CHF, chronic ([place]) 12/23/2014  . Encephalopathy, metabolic 79/39/0300  . Lymphedema of left arm 12/08/2014  . OSA (obstructive sleep apnea) 12/08/2014  . Constipation 12/08/2014  . Nonischemic cardiomyopathy ([place]) 11/10/2014  . Acute on chronic systolic heart failure, NYHA class 3 ([place]) 11/10/2014  . Morbidly obese ([place]) 11/10/2014  . ESRD (end stage renal disease) on dialysis ([place])   . Type 2 diabetes mellitus with diabetic nephropathy ([place])   . Essential hypertension   . Left hip pain   . NSTEMI (non-ST elevated myocardial infarction) ([place]) 10/13/2014  . Pulmonary HTN ([place]) 10/13/2014  .  Syncope 10/12/2014  . LBBB (left bundle branch block) 10/12/2014  . Breast cancer ([place])   . Hypertension   . Arthritis   . COPD (chronic obstructive pulmonary disease) ([place])     Jomarie Longs PT 12/20/2016, 10:21 PM  [facility] [address], [place], [address] Phone: [phone]   Fax:  [phone]  Name: Royalti Schauf [name] MRN: [id number] Date of Birth: [birth year]

## 2016-12-25 ENCOUNTER — Ambulatory Visit: Payer: Medicare Other | Admitting: Physical Therapy

## 2016-12-25 ENCOUNTER — Encounter: Payer: Self-pay | Admitting: Physical Therapy

## 2016-12-25 DIAGNOSIS — M6281 Muscle weakness (generalized): Secondary | ICD-10-CM

## 2016-12-25 DIAGNOSIS — M546 Pain in thoracic spine: Secondary | ICD-10-CM | POA: Diagnosis not present

## 2016-12-25 DIAGNOSIS — M62838 Other muscle spasm: Secondary | ICD-10-CM

## 2016-12-25 NOTE — Therapy (Signed)
[facility] [address], [place], [address] Phone: [phone]   Fax:  [phone]  Physical Therapy Treatment  Patient Details  Name: Jordan [name] MRN: [id number] Date of Birth: 07/07/1940 Referring Provider: Carlynn Spry PA-C  Encounter Date: 12/25/2016      PT End of Session - 12/25/16 1148    Visit Number 2   Number of Visits 12   Date for PT Re-Evaluation 01/30/17   Authorization Type 2   Authorization Time Period 10 G codes   PT Start Time Jul 11, 1141   PT Stop Time 07-11-13   PT Time Calculation (min) 32 min   Activity Tolerance Patient tolerated treatment well;Patient limited by pain   Behavior During Therapy [facility] for tasks assessed/performed      Past Medical History:  Diagnosis Date  . Arthritis   . Breast cancer ([place])   . CAD (coronary artery disease)    a. minimal CAD by cath in 10/2014.  Marland Kitchen CKD (chronic kidney disease)   . COPD (chronic obstructive pulmonary disease) ([place])   . Diabetes mellitus with ESRD (end-stage renal disease) ([place])   . GERD (gastroesophageal reflux disease)   . Hypertension   . Renal disorder    Family reports acute renal failure  . Sleep apnea    does not wear CPAP  . Systolic CHF, chronic ([place]) 12/23/2014    Past Surgical History:  Procedure Laterality Date  . ARTERIOVENOUS GRAFT PLACEMENT Right 10/05/15  . AV FISTULA PLACEMENT Right 10/26/2014   Procedure: Right Brachiocephalic Arteriovenous FISTULA CREATION;  Surgeon: Conrad [place], MD;  Location: [facility];  Service: Vascular;  Laterality: Right;  . AV FISTULA PLACEMENT Right 10/05/2015   Procedure: INSERTION OF  ARTERIOVENOUS (AV) GORE-TEX GRAFT RIGHT ARM;  Surgeon: Conrad Orchard, MD;  Location: [facility];  Service: Vascular;  Laterality: Right;  . BASCILIC VEIN TRANSPOSITION Right 04/13/2015   Procedure: RIGHT FIRST STAGE BASCILIC VEIN TRANSPOSITION;  Surgeon: Conrad [place], MD;  Location: [facility];  Service: Vascular;   Laterality: Right;  . BASCILIC VEIN TRANSPOSITION Right 07/11/2015   Procedure: SECOND STAGE BASILIC VEIN TRANSPOSITION;  Surgeon: Conrad [place], MD;  Location: [facility];  Service: Vascular;  Laterality: Right;  . BREAST SURGERY Left   . CARDIAC CATHETERIZATION [name] 10/19/2014   Procedure: Right/Left Heart Cath and Coronary Angiography;  Surgeon: Peter M Martinique, MD;  Location: [place] CV [facility];  Service: Cardiovascular;  Laterality: [name];  . FISTULA SUPERFICIALIZATION Right 01/26/2015   Procedure: Right BRACHIOCEPHALIC ARTERIOVENOUS FISTULA SUPERFICIALIZATION with side branch ligation;  Surgeon: Conrad [place], MD;  Location: [facility];  Service: Vascular;  Laterality: Right;  . IR DIALY SHUNT INTRO [place] W/IMG RIGHT Right 08/23/2016  . IR GENERIC HISTORICAL  04/30/2016   IR REMOVAL TUN CV CATH W/O FL 04/30/2016 Aletta Edouard, MD [facility]-INTERV RAD  . IR US GUIDE VASC ACCESS RIGHT  08/23/2016  . LIGATION OF ARTERIOVENOUS  FISTULA Right 03/07/2015   Procedure: LIGATION OF RIGHT BRACHIOCEPHALIC ARTERIOVENOUS  FISTULA;  Surgeon: Conrad De Borgia, MD;  Location: [facility];  Service: Vascular;  Laterality: Right;  . LIGATION OF ARTERIOVENOUS  FISTULA Right 10/05/2015   Procedure: LIGATION OF RIGHT BASILIC VEIN TRANSPOSITION; EXCISION OF CICATRIX;  Surgeon: Conrad [place], MD;  Location: [facility] [facility];  Service: Vascular;  Laterality: Right;  . PERIPHERAL VASCULAR CATHETERIZATION [name] 01/24/2015   Procedure: Fistulagram;  Surgeon: Conrad [place], MD;  Location: [facility];  Service: Cardiovascular;  Laterality: [name];  . PERIPHERAL VASCULAR CATHETERIZATION [name] 08/29/2015   Procedure: Fistulagram;  Surgeon: Conrad Brazil, MD;  Location: [facility];  Service: Cardiovascular;  Laterality: [name];  . PERIPHERAL VASCULAR CATHETERIZATION Right 08/29/2015   Procedure: Peripheral Vascular Balloon Angioplasty;  Surgeon: Conrad [place], MD;  Location: [place] [facility];  Service: Cardiovascular;  Laterality: Right;  arm  fistula  . TEE WITHOUT CARDIOVERSION [name] 12/27/2014   Procedure: TRANSESOPHAGEAL ECHOCARDIOGRAM (TEE);  Surgeon: Thayer Headings, MD;  Location: [facility];  Service: Cardiovascular;  Laterality: [name];  . THROMBECTOMY W/ EMBOLECTOMY Right 03/07/2015   Procedure: THROMBECTOMY OF RIGHT BRACHIOCEPHALIC ARTERIOVENOUS FISTULA;  Surgeon: Conrad , MD;  Location: [facility];  Service: Vascular;  Laterality: Right;  . THROMBECTOMY W/ EMBOLECTOMY Right 01/27/2016   Procedure: THROMBECTOMY AND REVISION OF RIGHT UPPER ARM ARTERIOVENOUS GORE-TEX GRAFT;  Surgeon: Angelia Mould, MD;  Location: [facility];  Service: Vascular;  Laterality: Right;    There were no vitals filed for this visit.      Subjective Assessment - 12/25/16 1144    Subjective Patient reports that her back pain was doing better and now with damp, stormy weather coming in she is having increased pain today.    Pertinent History April 2018 reports beginning to have pain in right thoracic spine no apparent reason. Pain has stayed about the same as when it began. Nothing makes it better or worse.    Limitations Walking;Standing;Other (comment)  lying   How long can you sit comfortably? no problem   How long can you stand comfortably? unable to stand <15 min.   How long can you walk comfortably? short distances only   Patient Stated Goals improve abiltiy to walk and perform daily tasks with less pain   Currently in Pain? No/denies  aggrvated with moving        Objective;  Right arm with dressing in place (performed procedure 12/24/16 to clear blood clot in right upper arm) Palpation: upper back right side with spasms upper trapeizus and mid thoracic spine  Treatment: Manual therapy: 15 min. Goal: improve soft tissue elasticity, decreased spasms/pain STM performed to patient right side upper and mid trapezius, posterior aspect of right shoulder into lats, rotators with superficial techniques  Therapeutic exercise: Patient  performed with demonstration, instruction, VC of therapist Patient seated: performed to right UE: Isometric ER 3 x 5 reps with manual resistance moderate intensity Scapular retraction 3 x 5 reps with guidance and assist of therapist with VC for full ROM  Patient response to treatment: patient demonstrated improved technique with exercises with minimal VC for correct alignment. Patient with decreased tenderness and spasms right shoulder/upper trapezius by 50% following STM.         PT Education - 12/25/16 1148    Education provided Yes   Education Details posture awareness, scapular retraction   Person(s) Educated Patient   Methods Explanation;Verbal cues;Demonstration   Comprehension Verbalized understanding;Returned demonstration;Verbal cues required             PT Long Term Goals - 12/19/16 2204      PT LONG TERM GOAL #1   Title Patient will improve MODI to 40% or better to demonstrate improved pain level and function with ADL's with less difficulty   Baseline MODI 50%   Status New   Target Date 01/10/17     PT LONG TERM GOAL #2   Title Patient will improve MODI to 30% or better to demonstrate  improved pain level and function with ADL's with less difficulty   Baseline MODI 50%   Status New   Target Date 01/30/17     PT LONG TERM GOAL #3   Title Independent with home exercise program for pain control, posture awareness and exercises to allow transistion to self managment following discharge from physical therapy   Baseline limited knowledge   Status New   Target Date 01/30/17               Plan - 12/25/16 1149    Clinical Impression Statement Patient is more aware of proper posture and exercises to assist with back pain. She continues with spasms and pain that are responding well to treatment and will continue to require additional physical therapy intervention to achieve goals.    Rehab Potential Fair   Clinical Impairments Affecting Rehab Potential  (+)motivated, (-)multiple co morbidities   PT Frequency 1x / week   PT Duration 6 weeks   PT Treatment/Interventions Moist Heat;Electrical Stimulation;Patient/family education;Neuromuscular re-education;Therapeutic exercise;Manual techniques   PT Next Visit Plan pain control, exercies   PT Home Exercise Plan posture awareness, scapular retraction   Consulted and Agree with Plan of Care --      Patient will benefit from skilled therapeutic intervention in order to improve the following deficits and impairments:  Decreased strength, Impaired perceived functional ability, Increased muscle spasms, Impaired UE functional use, Pain, Difficulty walking  Visit Diagnosis: Pain in thoracic spine  Other muscle spasm  Muscle weakness (generalized)     Problem List Patient Active Problem List   Diagnosis Date Noted  . Paresthesia of left arm 11/09/2016  . Chronic thoracic back pain 11/09/2016  . Personal history of breast cancer 08/26/2016  . Atypical chest pain 08/03/2016  . Chronic diastolic CHF (congestive heart failure) ([place]) 05/11/2015  . Loose stools 01/13/2015  . Skin irritation 01/13/2015  . Rib pain on left side 01/13/2015  . Enterococcal bacteremia 12/26/2014  . Bacteremia   . UTI (lower urinary tract infection) 12/23/2014  . Acute encephalopathy 12/23/2014  . Systolic CHF, chronic ([place]) 12/23/2014  . Encephalopathy, metabolic 77/41/2878  . Lymphedema of left arm 12/08/2014  . OSA (obstructive sleep apnea) 12/08/2014  . Constipation 12/08/2014  . Nonischemic cardiomyopathy ([place]) 11/10/2014  . Acute on chronic systolic heart failure, NYHA class 3 ([place]) 11/10/2014  . Morbidly obese ([place]) 11/10/2014  . ESRD (end stage renal disease) on dialysis ([place])   . Type 2 diabetes mellitus with diabetic nephropathy ([place])   . Essential hypertension   . Left hip pain   . NSTEMI (non-ST elevated myocardial infarction) ([place]) 10/13/2014  . Pulmonary HTN ([place]) 10/13/2014  . Syncope  10/12/2014  . LBBB (left bundle branch block) 10/12/2014  . Breast cancer ([place])   . Hypertension   . Arthritis   . COPD (chronic obstructive pulmonary disease) ([place])     Jomarie Longs PT 12/26/2016, 1:37 PM  [facility] [address] [place], [address] Phone: [phone]   Fax:  [phone]  Name: Mariama Saintvil [name] MRN: [id number] Date of Birth: 1940/04/19

## 2016-12-26 ENCOUNTER — Ambulatory Visit: Payer: Medicare Other | Admitting: Oncology

## 2016-12-27 ENCOUNTER — Encounter: Payer: Medicare Other | Admitting: Physical Therapy

## 2017-01-01 ENCOUNTER — Ambulatory Visit: Payer: Medicare Other | Admitting: Physical Therapy

## 2017-01-03 ENCOUNTER — Inpatient Hospital Stay: Payer: Medicare Other | Admitting: Oncology

## 2017-01-08 ENCOUNTER — Ambulatory Visit: Payer: Medicare Other | Admitting: Physical Therapy

## 2017-01-09 ENCOUNTER — Inpatient Hospital Stay: Payer: Medicare Other | Admitting: Oncology

## 2017-01-15 ENCOUNTER — Ambulatory Visit: Payer: Medicare Other | Attending: Orthopedic Surgery | Admitting: Physical Therapy

## 2017-01-15 ENCOUNTER — Telehealth: Payer: Self-pay | Admitting: *Deleted

## 2017-01-15 NOTE — Telephone Encounter (Signed)
Pt. requested a script for diabetic shoes.  Pt contact [phone]

## 2017-01-15 NOTE — Telephone Encounter (Signed)
Patient notified order is at front desk

## 2017-01-20 NOTE — Progress Notes (Deleted)
[name]  [phone] [phone]  ID: Jordan [name] OB: Mar 02, 1941  MR#: [id number]  TKP#:[id number]  Patient Care Team: Leone Haven, MD as PCP - General (Family Medicine) Rexene Agent, MD as Attending Physician (Nephrology) Martinique, Peter M, MD as Consulting Physician (Cardiology) [place]: Personal history of breast cancer.  INTERVAL HISTORY: Patient is a 76 year old female who moved to [place] from [place] approximately 2 years ago. 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 Patient underwent a full mastectomy with lymph node dissection. She reports 8 lymph nodes were removed, but she is unclear how many were positive. She had adjuvant chemotherapy possibly using Taxotere and Cytoxan every 3 weeks for 4 cycles. She then received adjuvant XRT. Given the triple negative status of her breast cancer, an aromatase inhibitor was not initiated. She currently feels well and is at her baseline. She complains of left arm swelling secondary to lymphedema. She also has some pruritic skin at the site of her mastectomy scar. She has no neurologic complaints. She denies any recent fevers. She has a good appetite and denies weight loss. She has no chest pain or shortness of breath. She denies any nausea, vomiting, constipation, or diarrhea. She has no urinary complaints. Patient otherwise feels well and offers no further specific complaints.  REVIEW OF SYSTEMS:   Review of Systems  Constitutional: Negative.  Negative for fever, malaise/fatigue and weight loss.  Respiratory: Negative.  Negative for cough and shortness of breath.   Cardiovascular: Negative.  Negative for chest pain and leg swelling.  Gastrointestinal: Negative.  Negative for abdominal pain.  Genitourinary: Negative.   Musculoskeletal: Negative.   Skin: Positive for itching. Negative for rash.    Neurological: Negative.  Negative for sensory change and weakness.  Psychiatric/Behavioral: Negative.  The patient is not nervous/anxious.     As per HPI. Otherwise, a complete review of systems is negative.  PAST MEDICAL HISTORY: Past Medical History:  Diagnosis Date  . Arthritis   . Breast cancer ([place])   . CAD (coronary artery disease)    a. minimal CAD by cath in 10/2014.  Marland Kitchen CKD (chronic kidney disease)   . COPD (chronic obstructive pulmonary disease) ([place])   . Diabetes mellitus with ESRD (end-stage renal disease) ([place])   . GERD (gastroesophageal reflux disease)   . Hypertension   . Renal disorder    Family reports acute renal failure  . Sleep apnea    does not wear CPAP  . Systolic CHF, chronic ([place]) 12/23/2014    PAST SURGICAL HISTORY: Past Surgical History:  Procedure Laterality Date  . ARTERIOVENOUS GRAFT PLACEMENT Right 10/05/15  . AV FISTULA PLACEMENT Right 10/26/2014   Procedure: Right Brachiocephalic Arteriovenous FISTULA CREATION;  Surgeon: Conrad [place], MD;  Location: [facility];  Service: Vascular;  Laterality: Right;  . AV FISTULA PLACEMENT Right 10/05/2015   Procedure: INSERTION OF  ARTERIOVENOUS (AV) GORE-TEX GRAFT RIGHT ARM;  Surgeon: Conrad [place], MD;  Location: [facility];  Service: Vascular;  Laterality: Right;  . BASCILIC VEIN TRANSPOSITION Right 04/13/2015   Procedure: RIGHT FIRST STAGE BASCILIC VEIN TRANSPOSITION;  Surgeon: Conrad Beebe, MD;  Location: [facility];  Service: Vascular;  Laterality: Right;  . BASCILIC VEIN TRANSPOSITION Right 07/11/2015   Procedure: SECOND STAGE BASILIC VEIN TRANSPOSITION;  Surgeon: Conrad [place], MD;  Location: [facility];  Service: Vascular;  Laterality: Right;  . BREAST SURGERY Left   .  CARDIAC CATHETERIZATION [name] 10/19/2014   Procedure: Right/Left Heart Cath and Coronary Angiography;  Surgeon: Peter M Martinique, MD;  Location: [facility];  Service: Cardiovascular;  Laterality: [name];  . FISTULA SUPERFICIALIZATION Right 01/26/2015    Procedure: Right BRACHIOCEPHALIC ARTERIOVENOUS FISTULA SUPERFICIALIZATION with side branch ligation;  Surgeon: Conrad [place], MD;  Location: [facility];  Service: Vascular;  Laterality: Right;  . IR DIALY SHUNT INTRO [place] W/IMG RIGHT Right 08/23/2016  . IR GENERIC HISTORICAL  04/30/2016   IR REMOVAL TUN CV CATH W/O FL 04/30/2016 Aletta Edouard, MD [facility]-INTERV RAD  . IR US GUIDE VASC ACCESS RIGHT  08/23/2016  . LIGATION OF ARTERIOVENOUS  FISTULA Right 03/07/2015   Procedure: LIGATION OF RIGHT BRACHIOCEPHALIC ARTERIOVENOUS  FISTULA;  Surgeon: Conrad Tripp, MD;  Location: [facility];  Service: Vascular;  Laterality: Right;  . LIGATION OF ARTERIOVENOUS  FISTULA Right 10/05/2015   Procedure: LIGATION OF RIGHT BASILIC VEIN TRANSPOSITION; EXCISION OF CICATRIX;  Surgeon: Conrad [place], MD;  Location: [facility];  Service: Vascular;  Laterality: Right;  . PERIPHERAL VASCULAR CATHETERIZATION [name] 01/24/2015   Procedure: Fistulagram;  Surgeon: Conrad Metcalfe, MD;  Location: [facility];  Service: Cardiovascular;  Laterality: [name];  . PERIPHERAL VASCULAR CATHETERIZATION [name] 08/29/2015   Procedure: Fistulagram;  Surgeon: Conrad [place], MD;  Location: [facility];  Service: Cardiovascular;  Laterality: [name];  . PERIPHERAL VASCULAR CATHETERIZATION Right 08/29/2015   Procedure: Peripheral Vascular Balloon Angioplasty;  Surgeon: Conrad , MD;  Location: [facility];  Service: Cardiovascular;  Laterality: Right;  arm fistula  . TEE WITHOUT CARDIOVERSION [name] 12/27/2014   Procedure: TRANSESOPHAGEAL ECHOCARDIOGRAM (TEE);  Surgeon: Thayer Headings, MD;  Location: [facility];  Service: Cardiovascular;  Laterality: [name];  . THROMBECTOMY W/ EMBOLECTOMY Right 03/07/2015   Procedure: THROMBECTOMY OF RIGHT BRACHIOCEPHALIC ARTERIOVENOUS FISTULA;  Surgeon: Conrad , MD;  Location: [facility];  Service: Vascular;  Laterality: Right;  . THROMBECTOMY W/ EMBOLECTOMY Right 01/27/2016   Procedure: THROMBECTOMY AND  REVISION OF RIGHT UPPER ARM ARTERIOVENOUS GORE-TEX GRAFT;  Surgeon: Angelia Mould, MD;  [facility];  Service: Vascular;  Laterality: Right;    FAMILY HISTORY: Family History  Problem Relation Age of Onset  . Diabetes Mother   . Hypertension Mother   . Stroke Sister   . Hypertension Sister   . Heart attack Neg Hx     ADVANCED DIRECTIVES (Y/N):  N  HEALTH MAINTENANCE: Social History  Substance Use Topics  . Smoking status: Former Research scientist (life sciences)  . Smokeless tobacco: Never Used  . Alcohol use No     Colonoscopy:  PAP:  Bone density:  Lipid panel:  Allergies  Allergen Reactions  . Vicodin [Hydrocodone-Acetaminophen] Itching  . Adhesive [Tape] Itching, Rash and Other (See Comments)    Please use "paper" tape    Current Outpatient Prescriptions  Medication Sig Dispense Refill  . aspirin 81 MG tablet Take 81 mg by mouth every Monday,Wednesday,Friday, and Sunday at 6 PM.     . calcium carbonate (TUMS EX) 750 MG chewable tablet Chew 750 mg by mouth 3 (three) times daily with meals.     . carvedilol (COREG) 6.25 MG tablet Take 1 tablet (6.25 mg total) by mouth See admin instructions. Take 6.25 mg by mouth twice daily on Monday, Wednesday, Friday and Sunday. 60 tablet 3  . gabapentin (NEURONTIN) 300 MG capsule Take 1 capsule (300 mg total) by mouth at bedtime. (Patient taking differently: Take 300 mg  by mouth every Monday,Wednesday,Friday, and Sunday at 6 PM. ) 60 capsule 6  . Insulin Detemir (LEVEMIR FLEXPEN) 100 UNIT/ML Pen Inject 55 Units into the skin at bedtime. 18 mL 3   No current facility-administered medications for this visit.     OBJECTIVE: There were no vitals filed for this visit.   There is no height or weight on file to calculate BMI.    ECOG FS:0 - Asymptomatic  General: Well-developed, well-nourished, no acute distress. Eyes: Pink conjunctiva, anicteric sclera. HEENT: Normocephalic, moist mucous membranes, clear oropharnyx. Breasts: Left mastectomy scar  without evidence of recurrence. Lungs: Clear to auscultation bilaterally. Heart: Regular rate and rhythm. No rubs, murmurs, or gallops. Abdomen: Soft, nontender, nondistended. No organomegaly noted, normoactive bowel sounds. Musculoskeletal: Left arm lymphedema. Dialysis shunt in right upper arm. Neuro: Alert, answering all questions appropriately. Cranial nerves grossly intact. Skin: No rashes or petechiae noted. Psych: Normal affect.    LAB RESULTS:  Lab Results  Component Value Date   NA 137 01/27/2016   K 4.6 01/27/2016   CL 97 (L) 08/29/2015   CO2 24 12/28/2014   GLUCOSE 125 (H) 01/27/2016   BUN 45 (H) 08/29/2015   CREATININE 8.60 (H) 08/29/2015   CALCIUM 8.1 (L) 12/28/2014   PROT 6.9 10/12/2014   ALBUMIN 2.8 (L) 12/28/2014   AST 59 (H) 10/12/2014   ALT 37 10/12/2014   ALKPHOS 101 10/12/2014   BILITOT 0.2 (L) 10/12/2014   GFRNONAA 4 (L) 12/28/2014   GFRAA 4 (L) 12/28/2014    Lab Results  Component Value Date   WBC 11.4 (H) 12/28/2014   NEUTROABS 9.8 (H) 10/25/2014   HGB 12.2 01/27/2016   HCT 36.0 01/27/2016   MCV 85.0 12/28/2014   PLT 298 12/28/2014     STUDIES: No results found.  ASSESSMENT: Personal history of breast cancer  PLAN:    1. Personal history of breast cancer: No obvious evidence of recurrence. 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 Patient underwent a full mastectomy with lymph node dissection. She reports 8 lymph nodes were removed, but she is unclear how many were positive. She had adjuvant chemotherapy possibly using Taxotere and Cytoxan every 3 weeks for 4 cycles. She then received adjuvant XRT. Given the triple negative status of her breast cancer, an aromatase inhibitor was not initiated. No further intervention is needed. Patient will require a right breast screening mammogram in the next 1-2 weeks which has been ordered. Given the lymphedema in her left arm and her dialysis shunt in her  right arm, cannot draw blood in the [place] to monitor CA-27-29. If there is concern, this could be drawn at dialysis. Return to clinic in 4 months for routine evaluation. 2. Lymphedema: Patient was given a referral to lymphedema clinic. 3. End-stage renal disease: Continue dialysis as directed from shunt in patient's right arm.  Approximately 60 minutes was spent in discussion of which greater than 50% was consultation.   Patient expressed understanding and was in agreement with this plan. She also understands that She can call clinic at any time with any questions, concerns, or complaints.    Lloyd Huger, MD   01/20/2017 9:41 AM

## 2017-01-22 NOTE — Progress Notes (Deleted)
Cardiology Office Note   Date:  01/22/2017   ID:  Jordan [name], DOB 07-07-1940, MRN [id number]  PCP:  Leone Haven, MD  Cardiologist:  Peter Martinique, MD   No chief complaint on file.     History of Present Illness: Jordan [name] is a 76 y.o. female who presents for follow up of CHF and pulmonary HTN. She has a history of ESRD on dialysis, HTN, COPD, DM, and breast CA s/p chemo and RT. She was admitted in June 2016 after an episode of unresponsiveness. Echo showed an EF of 30% with pulmonary HTN. She underwent cardiac cath. This demonstrated severe pulmonary HTN with elevated filling pressures and normal cardiac output. No significant CAD.  She has had multiple procedures  for vascular access. Last in June 2017.  In September 2016 she had bacteremia. TEE was done showing no vegetations. EF was 50-55%.   On follow up today she reports her breathing is better. She is tolerating dialysis much better. She does have some hypotension with dialysis and Coreg is held on dialysis days.  Weight is down 11 lbs. She is now following a Vegan diet. Continues sodium restriction.  No palpitations. She has had difficulty with vascular access.    Past Medical History:  Diagnosis Date  . Arthritis   . Breast cancer ([facility])   . CAD (coronary artery disease)    a. minimal CAD by cath in 10/2014.  Marland Kitchen CKD (chronic kidney disease)   . COPD (chronic obstructive pulmonary disease) ([facility])   . Diabetes mellitus with ESRD (end-stage renal disease) ([facility])   . GERD (gastroesophageal reflux disease)   . Hypertension   . Renal disorder    Family reports acute renal failure  . Sleep apnea    does not wear CPAP  . Systolic CHF, chronic ([facility]) 12/23/2014    Past Surgical History:  Procedure Laterality Date  . ARTERIOVENOUS GRAFT PLACEMENT Right 10/05/15  . AV FISTULA PLACEMENT Right 10/26/2014   Procedure: Right Brachiocephalic Arteriovenous FISTULA CREATION;  Surgeon: Conrad Savage, MD;   Location: [facility];  Service: Vascular;  Laterality: Right;  . AV FISTULA PLACEMENT Right 10/05/2015   Procedure: INSERTION OF  ARTERIOVENOUS (AV) GORE-TEX GRAFT RIGHT ARM;  Surgeon: Conrad Wharton, MD;  Location: [facility];  Service: Vascular;  Laterality: Right;  . BASCILIC VEIN TRANSPOSITION Right 04/13/2015   Procedure: RIGHT FIRST STAGE BASCILIC VEIN TRANSPOSITION;  Surgeon: Conrad Trinity, MD;  Location: [facility];  Service: Vascular;  Laterality: Right;  . BASCILIC VEIN TRANSPOSITION Right 07/11/2015   Procedure: SECOND STAGE BASILIC VEIN TRANSPOSITION;  Surgeon: Conrad Bradfordsville, MD;  Location: [facility];  Service: Vascular;  Laterality: Right;  . BREAST SURGERY Left   . CARDIAC CATHETERIZATION [name] 10/19/2014   Procedure: Right/Left Heart Cath and Coronary Angiography;  Surgeon: Peter M Martinique, MD;  Location: [facility];  Service: Cardiovascular;  Laterality: [name];  . FISTULA SUPERFICIALIZATION Right 01/26/2015   Procedure: Right BRACHIOCEPHALIC ARTERIOVENOUS FISTULA SUPERFICIALIZATION with side branch ligation;  Surgeon: Conrad Holliday, MD;  Location: [facility];  Service: Vascular;  Laterality: Right;  . IR DIALY SHUNT INTRO [place] W/IMG RIGHT Right 08/23/2016  . IR GENERIC HISTORICAL  04/30/2016   IR REMOVAL TUN CV CATH W/O FL 04/30/2016 Aletta Edouard, MD [facility]  . IR US GUIDE VASC ACCESS RIGHT  08/23/2016  . LIGATION OF ARTERIOVENOUS  FISTULA Right 03/07/2015   Procedure: LIGATION OF RIGHT BRACHIOCEPHALIC ARTERIOVENOUS  FISTULA;  Surgeon: Conrad Ina, MD;  Location: [facility];  Service: Vascular;  Laterality: Right;  . LIGATION OF ARTERIOVENOUS  FISTULA Right 10/05/2015   Procedure: LIGATION OF RIGHT BASILIC VEIN TRANSPOSITION; EXCISION OF CICATRIX;  Surgeon: Conrad Prosper, MD;  Location: [facility];  Service: Vascular;  Laterality: Right;  . PERIPHERAL VASCULAR CATHETERIZATION [name] 01/24/2015   Procedure: Fistulagram;  Surgeon: Conrad Nespe[place], MD;  Location: [facility];  Service:  Cardiovascular;  Laterality: [name];  . PERIPHERAL VASCULAR CATHETERIZATION [name] 08/29/2015   Procedure: Fistulagram;  Surgeon: Conrad Combes, MD;  Location: [facility];  Service: Cardiovascular;  Laterality: [name];  . PERIPHERAL VASCULAR CATHETERIZATION Right 08/29/2015   Procedure: Peripheral Vascular Balloon Angioplasty;  Surgeon: Conrad Atascadero, MD;  Location: [facility];  Service: Cardiovascular;  Laterality: Right;  arm fistula  . TEE WITHOUT CARDIOVERSION [name] 12/27/2014   Procedure: TRANSESOPHAGEAL ECHOCARDIOGRAM (TEE);  Surgeon: Thayer Headings, MD;  Location: [facility];  Service: Cardiovascular;  Laterality: [name];  . THROMBECTOMY W/ EMBOLECTOMY Right 03/07/2015   Procedure: THROMBECTOMY OF RIGHT BRACHIOCEPHALIC ARTERIOVENOUS FISTULA;  Surgeon: Conrad Avenel, MD;  Location: [facility];  Service: Vascular;  Laterality: Right;  . THROMBECTOMY W/ EMBOLECTOMY Right 01/27/2016   Procedure: THROMBECTOMY AND REVISION OF RIGHT UPPER ARM ARTERIOVENOUS GORE-TEX GRAFT;  Surgeon: Angelia Mould, MD;  Location: [facility];  Service: Vascular;  Laterality: Right;     Current Outpatient Prescriptions  Medication Sig Dispense Refill  . aspirin 81 MG tablet Take 81 mg by mouth every Monday,Wednesday,Friday, and Sunday at 6 PM.     . calcium carbonate (TUMS EX) 750 MG chewable tablet Chew 750 mg by mouth 3 (three) times daily with meals.     . carvedilol (COREG) 6.25 MG tablet Take 1 tablet (6.25 mg total) by mouth See admin instructions. Take 6.25 mg by mouth twice daily on Monday, Wednesday, Friday and Sunday. 60 tablet 3  . gabapentin (NEURONTIN) 300 MG capsule Take 1 capsule (300 mg total) by mouth at bedtime. (Patient taking differently: Take 300 mg by mouth every Monday,Wednesday,Friday, and Sunday at 6 PM. ) 60 capsule 6  . Insulin Detemir (LEVEMIR FLEXPEN) 100 UNIT/ML Pen Inject 55 Units into the skin at bedtime. 18 mL 3   No current facility-administered medications for this visit.      Allergies:   Vicodin [hydrocodone-acetaminophen] and Adhesive [tape]    Social History:  The patient  reports that she has quit smoking. She has never used smokeless tobacco. She reports that she does not drink alcohol or use drugs.   Family History:  The patient's family history includes Diabetes in her mother; Hypertension in her mother and sister; Stroke in her sister.    ROS:  Please see the history of present illness.   Otherwise, review of systems are positive for none.   All other systems are reviewed and negative.    PHYSICAL EXAM: VS:  There were no vitals taken for this visit. , BMI There is no height or weight on file to calculate BMI. GEN: morbidly obese BF in NAD, in no acute distress  HEENT: normal  Neck: no JVD, carotid bruits, or masses Cardiac: RRR; no murmurs, rubs, or gallops,no edema  Chest: Vas cath access right subclavian area Respiratory:  clear to auscultation bilaterally, normal work of breathing GI: soft, nontender, nondistended, + BS MS: no deformity or atrophy, chronic lymphedema left arm.  Skin: warm and dry, no rash Neuro:  Strength and  sensation are intact Psych: euthymic mood, full affect   EKG:  EKG is ordered today. The ekg ordered today demonstrates NSR with low voltage. Otherwise normal. Compared to prior [facility] LBBB has resolved.   Recent Labs: 01/27/2016: Hemoglobin 12.2; Potassium 4.6; Sodium 137    Lipid Panel    Component Value Date/Time   CHOL 199 10/12/2014 0745   TRIG 113 10/12/2014 0745   HDL 53 10/12/2014 0745   CHOLHDL 3.8 10/12/2014 0745   VLDL 23 10/12/2014 0745   LDLCALC 123 (H) 10/12/2014 0745      Wt Readings from Last 3 Encounters:  11/09/16 284 lb 3.2 oz (128.9 kg)  08/27/16 291 lb 12.8 oz (132.4 kg)  08/15/16 287 lb (130.2 kg)      Other studies Reviewed: Additional studies/ records that were reviewed today include: none. Review of the above records demonstrates: [name]   ASSESSMENT AND PLAN:  1.   Chronic diastolic CHF- exacerbated by ESRD. I personally reviewed her prior TTE and TEE studies. I believe the TEE is accurate as far as her EF is concerned. The TTE was very limited. Volume status maintained with dialysis. Reinforced importance of sodium restriction.  2. Severe pulmonary HTN  3. ESRD on HD.   4. DM on insulin.   5. HTN. Controlled.   6. Morbid obesity.  7. LBBB resolved.    Current medicines are reviewed at length with the patient today.  The patient does not have concerns regarding medicines.  The following changes have been made:  See above.  Labs/ tests ordered today include: none   No orders of the defined types were placed in this encounter.    Disposition:   FU with Dr. Martinique in one year  Signed, Peter Martinique, MD  01/22/2017 7:44 AM    [facility] [address] [place], [place], [address] Phone [phone], Fax [phone]

## 2017-01-23 ENCOUNTER — Inpatient Hospital Stay: Payer: Medicare Other | Admitting: Oncology

## 2017-01-25 ENCOUNTER — Ambulatory Visit: Payer: Medicare Other | Admitting: Cardiology

## 2017-02-13 ENCOUNTER — Ambulatory Visit: Payer: Medicare Other | Admitting: Family Medicine

## 2017-02-18 NOTE — Progress Notes (Deleted)
[facility]  Telephone:([phone]) [phone] Fax:(336) [phone]  ID: Jordan [name] OB: 01/05/1941  MR#: [id number]  YTK#:[id number]  Patient Care Team: Leone Haven, MD as PCP - General (Family Medicine) Rexene Agent, MD as Attending Physician (Nephrology) Martinique, Peter M, MD as Consulting Physician (Cardiology) [place]: Personal history of breast cancer.  INTERVAL HISTORY: Patient is a 76 year old female who moved to [place] from [place] approximately 2 years ago. 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 Patient underwent a full mastectomy with lymph node dissection. She reports 8 lymph nodes were removed, but she is unclear how many were positive. She had adjuvant chemotherapy possibly using Taxotere and Cytoxan every 3 weeks for 4 cycles. She then received adjuvant XRT. Given the triple negative status of her breast cancer, an aromatase inhibitor was not initiated. She currently feels well and is at her baseline. She complains of left arm swelling secondary to lymphedema. She also has some pruritic skin at the site of her mastectomy scar. She has no neurologic complaints. She denies any recent fevers. She has a good appetite and denies weight loss. She has no chest pain or shortness of breath. She denies any nausea, vomiting, constipation, or diarrhea. She has no urinary complaints. Patient otherwise feels well and offers no further specific complaints.  REVIEW OF SYSTEMS:   Review of Systems  Constitutional: Negative.  Negative for fever, malaise/fatigue and weight loss.  Respiratory: Negative.  Negative for cough and shortness of breath.   Cardiovascular: Negative.  Negative for chest pain and leg swelling.  Gastrointestinal: Negative.  Negative for abdominal pain.  Genitourinary: Negative.   Musculoskeletal: Negative.   Skin: Positive for itching. Negative for rash.    Neurological: Negative.  Negative for sensory change and weakness.  Psychiatric/Behavioral: Negative.  The patient is not nervous/anxious.     As per HPI. Otherwise, a complete review of systems is negative.  PAST MEDICAL HISTORY: Past Medical History:  Diagnosis Date  . Arthritis   . Breast cancer ([facility])   . CAD (coronary artery disease)    a. minimal CAD by cath in 10/2014.  Marland Kitchen CKD (chronic kidney disease)   . COPD (chronic obstructive pulmonary disease) ([facility])   . Diabetes mellitus with ESRD (end-stage renal disease) ([facility])   . GERD (gastroesophageal reflux disease)   . Hypertension   . Renal disorder    Family reports acute renal failure  . Sleep apnea    does not wear CPAP  . Systolic CHF, chronic ([facility]) 12/23/2014    PAST SURGICAL HISTORY: Past Surgical History:  Procedure Laterality Date  . ARTERIOVENOUS GRAFT PLACEMENT Right 10/05/15  . BREAST SURGERY Left   . IR DIALY SHUNT INTRO NEEDLE/INTRACATH INITIAL W/IMG RIGHT Right 08/23/2016  . IR GENERIC HISTORICAL  04/30/2016   IR REMOVAL TUN CV CATH W/O FL 04/30/2016 Aletta Edouard, MD [facility]  . IR US GUIDE VASC ACCESS RIGHT  08/23/2016    FAMILY HISTORY: Family History  Problem Relation Age of Onset  . Diabetes Mother   . Hypertension Mother   . Stroke Sister   . Hypertension Sister   . Heart attack Neg Hx     ADVANCED DIRECTIVES (Y/N):  N  HEALTH MAINTENANCE: Social History   Tobacco Use  . Smoking status: Former Research scientist (life sciences)  . Smokeless tobacco: Never Used  Substance Use Topics  . Alcohol use: No    Alcohol/week: 0.0 oz  .  Drug use: No     Colonoscopy:  PAP:  Bone density:  Lipid panel:  Allergies  Allergen Reactions  . Vicodin [Hydrocodone-Acetaminophen] Itching  . Adhesive [Tape] Itching, Rash and Other (See Comments)    Please use "paper" tape    Current Outpatient Medications  Medication Sig Dispense Refill  . aspirin 81 MG tablet Take 81 mg by mouth every Monday,Wednesday,Friday, and Sunday  at 6 PM.     . calcium carbonate (TUMS EX) 750 MG chewable tablet Chew 750 mg by mouth 3 (three) times daily with meals.     . carvedilol (COREG) 6.25 MG tablet Take 1 tablet (6.25 mg total) by mouth See admin instructions. Take 6.25 mg by mouth twice daily on Monday, Wednesday, Friday and Sunday. 60 tablet 3  . gabapentin (NEURONTIN) 300 MG capsule Take 1 capsule (300 mg total) by mouth at bedtime. (Patient taking differently: Take 300 mg by mouth every Monday,Wednesday,Friday, and Sunday at 6 PM. ) 60 capsule 6  . Insulin Detemir (LEVEMIR FLEXPEN) 100 UNIT/ML Pen Inject 55 Units into the skin at bedtime. 18 mL 3   No current facility-administered medications for this visit.     OBJECTIVE: There were no vitals filed for this visit.   There is no height or weight on file to calculate BMI.    ECOG FS:0 - Asymptomatic  General: Well-developed, well-nourished, no acute distress. Eyes: Pink conjunctiva, anicteric sclera. HEENT: Normocephalic, moist mucous membranes, clear oropharnyx. Breasts: Left mastectomy scar without evidence of recurrence. Lungs: Clear to auscultation bilaterally. Heart: Regular rate and rhythm. No rubs, murmurs, or gallops. Abdomen: Soft, nontender, nondistended. No organomegaly noted, normoactive bowel sounds. Musculoskeletal: Left arm lymphedema. Dialysis shunt in right upper arm. Neuro: Alert, answering all questions appropriately. Cranial nerves grossly intact. Skin: No rashes or petechiae noted. Psych: Normal affect.    LAB RESULTS:  Lab Results  Component Value Date   NA 137 01/27/2016   K 4.6 01/27/2016   CL 97 (L) 08/29/2015   CO2 24 12/28/2014   GLUCOSE 125 (H) 01/27/2016   BUN 45 (H) 08/29/2015   CREATININE 8.60 (H) 08/29/2015   CALCIUM 8.1 (L) 12/28/2014   PROT 6.9 10/12/2014   ALBUMIN 2.8 (L) 12/28/2014   AST 59 (H) 10/12/2014   ALT 37 10/12/2014   ALKPHOS 101 10/12/2014   BILITOT 0.2 (L) 10/12/2014   GFRNONAA 4 (L) 12/28/2014   GFRAA 4 (L)  12/28/2014    Lab Results  Component Value Date   WBC 11.4 (H) 12/28/2014   NEUTROABS 9.8 (H) 10/25/2014   HGB 12.2 01/27/2016   HCT 36.0 01/27/2016   MCV 85.0 12/28/2014   PLT 298 12/28/2014     STUDIES: No results found.  ASSESSMENT: Personal history of breast cancer  PLAN:    1. Personal history of breast cancer: No obvious evidence of recurrence. 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 Patient underwent a full mastectomy with lymph node dissection. She reports 8 lymph nodes were removed, but she is unclear how many were positive. She had adjuvant chemotherapy possibly using Taxotere and Cytoxan every 3 weeks for 4 cycles. She then received adjuvant XRT. Given the triple negative status of her breast cancer, an aromatase inhibitor was not initiated. No further intervention is needed. Patient will require a right breast screening mammogram in the next 1-2 weeks which has been ordered. Given the lymphedema in her left arm and her dialysis shunt in her right arm,  cannot draw blood in the [facility] to monitor CA-27-29. If there is concern, this could be drawn at dialysis. Return to clinic in 4 months for routine evaluation. 2. Lymphedema: Patient was given a referral to lymphedema clinic. 3. End-stage renal disease: Continue dialysis as directed from shunt in patient's right arm.  Approximately 60 minutes was spent in discussion of which greater than 50% was consultation.   Patient expressed understanding and was in agreement with this plan. She also understands that She can call clinic at any time with any questions, concerns, or complaints.    Lloyd Huger, MD   02/18/2017 1:47 PM

## 2017-02-20 ENCOUNTER — Encounter: Payer: Self-pay | Admitting: Oncology

## 2017-02-20 ENCOUNTER — Inpatient Hospital Stay: Payer: Medicare Other | Admitting: Oncology

## 2017-03-04 DIAGNOSIS — N189 Chronic kidney disease, unspecified: Secondary | ICD-10-CM | POA: Insufficient documentation

## 2017-03-04 DIAGNOSIS — E1122 Type 2 diabetes mellitus with diabetic chronic kidney disease: Secondary | ICD-10-CM | POA: Insufficient documentation

## 2017-03-04 DIAGNOSIS — I2510 Atherosclerotic heart disease of native coronary artery without angina pectoris: Secondary | ICD-10-CM | POA: Insufficient documentation

## 2017-03-04 DIAGNOSIS — N186 End stage renal disease: Secondary | ICD-10-CM

## 2017-03-04 DIAGNOSIS — G4730 Sleep apnea, unspecified: Secondary | ICD-10-CM | POA: Insufficient documentation

## 2017-03-04 DIAGNOSIS — K219 Gastro-esophageal reflux disease without esophagitis: Secondary | ICD-10-CM | POA: Insufficient documentation

## 2017-03-15 ENCOUNTER — Ambulatory Visit (INDEPENDENT_AMBULATORY_CARE_PROVIDER_SITE_OTHER): Payer: Medicare Other | Admitting: Cardiology

## 2017-03-15 ENCOUNTER — Encounter (INDEPENDENT_AMBULATORY_CARE_PROVIDER_SITE_OTHER): Payer: Self-pay

## 2017-03-15 ENCOUNTER — Encounter: Payer: Self-pay | Admitting: Cardiology

## 2017-03-15 VITALS — BP 143/75 | HR 85 | Ht 65.0 in | Wt 285.8 lb

## 2017-03-15 DIAGNOSIS — I5032 Chronic diastolic (congestive) heart failure: Secondary | ICD-10-CM | POA: Diagnosis not present

## 2017-03-15 DIAGNOSIS — I447 Left bundle-branch block, unspecified: Secondary | ICD-10-CM | POA: Diagnosis not present

## 2017-03-15 DIAGNOSIS — Z992 Dependence on renal dialysis: Secondary | ICD-10-CM

## 2017-03-15 DIAGNOSIS — I10 Essential (primary) hypertension: Secondary | ICD-10-CM

## 2017-03-15 DIAGNOSIS — N186 End stage renal disease: Secondary | ICD-10-CM

## 2017-03-15 NOTE — Progress Notes (Signed)
Cardiology Office Note    Date:  03/15/2017   ID:  Jordan [name], DOB [birth year], MRN [id number]  PCP:  Jordan Haven, MD  Cardiologist: Dr. Martinique  Chief Complaint  Patient presents with  . Congestive Heart Failure  . Hypertension    History of Present Illness:    Jordan [name] is a 76 y.o. female with past medical history of chronic diastolic CHF, pulmonary HTN, mild CAD by cath in 10/2014, ESRD (HD T,Th, Sat), HTN, HLD, Type 2 DM, and remote history of breast cancer (occurring in 2014) who presents to the office today for routine follow-up.   Was  evaluated by her PCP on 08/03/2016 for a cramping sensation along her left chest that felt like gas, as it would improve with burping. Denied any exertional symptoms. EKG was obtained at that time and showed NSR, HR 72, with known LBBB. When seen here in May it was felt that her symptoms were more consistent with GERD.   On follow up today she reports she is doing well. She reports that her volume status is good with dialysis and she is at her dry weight. She only gets SOB if she has to walk a long way. No increase in edema. She walks with a walker. For awhile she had to take midodrine due to hypotension during dialysis. She hasn't had to use this in the last month and doesn't take Coreg on her dialysis day.   Past Medical History:  Diagnosis Date  . Arthritis   . Breast cancer ([place])   . CAD (coronary artery disease)    a. minimal CAD by cath in 10/2014.  Marland Kitchen CKD (chronic kidney disease)   . COPD (chronic obstructive pulmonary disease) ([place])   . Diabetes mellitus with ESRD (end-stage renal disease) ([place])   . GERD (gastroesophageal reflux disease)   . Hypertension   . Renal disorder    Family reports acute renal failure  . Sleep apnea    does not wear CPAP  . Systolic CHF, chronic ([place]) 12/23/2014    Past Surgical History:  Procedure Laterality Date  . ARTERIOVENOUS GRAFT PLACEMENT Right 10/05/15  . AV  FISTULA PLACEMENT Right 10/26/2014   Procedure: Right Brachiocephalic Arteriovenous FISTULA CREATION;  Surgeon: Conrad Starke, MD;  Location: [place];  Service: Vascular;  Laterality: Right;  . AV FISTULA PLACEMENT Right 10/05/2015   Procedure: INSERTION OF  ARTERIOVENOUS (AV) GORE-TEX GRAFT RIGHT ARM;  Surgeon: Conrad Cuyamungue, MD;  Location: [place];  Service: Vascular;  Laterality: Right;  . BASCILIC VEIN TRANSPOSITION Right 04/13/2015   Procedure: RIGHT FIRST STAGE BASCILIC VEIN TRANSPOSITION;  Surgeon: Conrad Proctor, MD;  Location: [place];  Service: Vascular;  Laterality: Right;  . BASCILIC VEIN TRANSPOSITION Right 07/11/2015   Procedure: SECOND STAGE BASILIC VEIN TRANSPOSITION;  Surgeon: Conrad Blevins, MD;  Location: [place];  Service: Vascular;  Laterality: Right;  . BREAST SURGERY Left   . CARDIAC CATHETERIZATION [name] 10/19/2014   Procedure: Right/Left Heart Cath and Coronary Angiography;  Surgeon: Peter M Martinique, MD;  Location: [place] [facility];  Service: Cardiovascular;  Laterality: [name];  . FISTULA SUPERFICIALIZATION Right 01/26/2015   Procedure: Right BRACHIOCEPHALIC ARTERIOVENOUS FISTULA SUPERFICIALIZATION with side branch ligation;  Surgeon: Conrad , MD;  Location: [place];  Service: Vascular;  Laterality: Right;  . IR DIALY SHUNT INTRO [place] W/IMG RIGHT Right 08/23/2016  . IR GENERIC HISTORICAL  04/30/2016   IR REMOVAL TUN CV CATH W/O FL  04/30/2016 Aletta Edouard, MD [facility]  . IR US GUIDE VASC ACCESS RIGHT  08/23/2016  . LIGATION OF ARTERIOVENOUS  FISTULA Right 03/07/2015   Procedure: LIGATION OF RIGHT BRACHIOCEPHALIC ARTERIOVENOUS  FISTULA;  Surgeon: Conrad Addieville, MD;  Location: [place];  Service: Vascular;  Laterality: Right;  . LIGATION OF ARTERIOVENOUS  FISTULA Right 10/05/2015   Procedure: LIGATION OF RIGHT BASILIC VEIN TRANSPOSITION; EXCISION OF CICATRIX;  Surgeon: Conrad Finlayson, MD;  Location: [place];  Service: Vascular;  Laterality: Right;  . PERIPHERAL VASCULAR  CATHETERIZATION [name] 01/24/2015   Procedure: Fistulagram;  Surgeon: Conrad [place], MD;  Location: [facility];  Service: Cardiovascular;  Laterality: [name];  . PERIPHERAL VASCULAR CATHETERIZATION [name] 08/29/2015   Procedure: Fistulagram;  Surgeon: Conrad Dillon, MD;  Location: [facility] [facility];  Service: Cardiovascular;  Laterality: [name];  . PERIPHERAL VASCULAR CATHETERIZATION Right 08/29/2015   Procedure: Peripheral Vascular Balloon Angioplasty;  Surgeon: Conrad Braselton, MD;  Location: [facility] [facility];  Service: Cardiovascular;  Laterality: Right;  arm fistula  . TEE WITHOUT CARDIOVERSION [name] 12/27/2014   Procedure: TRANSESOPHAGEAL ECHOCARDIOGRAM (TEE);  Surgeon: Thayer Headings, MD;  Location: [place];  Service: Cardiovascular;  Laterality: [name];  . THROMBECTOMY W/ EMBOLECTOMY Right 03/07/2015   Procedure: THROMBECTOMY OF RIGHT BRACHIOCEPHALIC ARTERIOVENOUS FISTULA;  Surgeon: Conrad , MD;  Location: [place];  Service: Vascular;  Laterality: Right;  . THROMBECTOMY W/ EMBOLECTOMY Right 01/27/2016   Procedure: THROMBECTOMY AND REVISION OF RIGHT UPPER ARM ARTERIOVENOUS GORE-TEX GRAFT;  Surgeon: Angelia Mould, MD;  Location: [facility];  Service: Vascular;  Laterality: Right;    Current Medications: Outpatient Medications Prior to Visit  Medication Sig Dispense Refill  . aspirin 81 MG tablet Take 81 mg by mouth every Monday,Wednesday,Friday, and Sunday at 6 PM.     . calcium carbonate (TUMS EX) 750 MG chewable tablet Chew 750 mg by mouth 3 (three) times daily with meals.     . carvedilol (COREG) 6.25 MG tablet Take 1 tablet (6.25 mg total) by mouth See admin instructions. Take 6.25 mg by mouth twice daily on Monday, Wednesday, Friday and Sunday. 60 tablet 3  . gabapentin (NEURONTIN) 300 MG capsule Take 300 mg by mouth every other day.    . Insulin Detemir (LEVEMIR FLEXPEN) 100 UNIT/ML Pen Inject 55 Units into the skin at bedtime. 18 mL 3  . gabapentin (NEURONTIN) 300 MG capsule Take 1  capsule (300 mg total) by mouth at bedtime. (Patient not taking: Reported on 03/15/2017) 60 capsule 6   No facility-administered medications prior to visit.      Allergies:   Vicodin [hydrocodone-acetaminophen] and Adhesive [tape]   Social History   Socioeconomic History  . Marital status: Widowed    Spouse name: None  . Number of children: None  . Years of education: None  . Highest education level: None  Social Needs  . Financial resource strain: None  . Food insecurity - worry: None  . Food insecurity - inability: None  . Transportation needs - medical: None  . Transportation needs - non-medical: None  Occupational History  . None  Tobacco Use  . Smoking status: Former Research scientist (life sciences)  . Smokeless tobacco: Never Used  Substance and Sexual Activity  . Alcohol use: No    Alcohol/week: 0.0 oz  . Drug use: No  . Sexual activity: None  Other Topics Concern  . None  Social History Narrative  . None     Family History:  The patient's family history includes Diabetes in her mother; Hypertension in her mother and sister; Stroke in her sister.   Review of Systems:   Please see the history of present illness.    All other systems reviewed and are otherwise negative except as noted above.   Physical Exam:    VS:  BP (!) 143/75   Pulse 85   Ht 5\' 5"  (1.651 m)   Wt 285 lb 12.8 oz (129.6 kg)   SpO2 92%   BMI 47.56 kg/m    GENERAL:  Well appearing HEENT:  PERRL, EOMI, sclera are clear. Oropharynx is clear. NECK:  No jugular venous distention, carotid upstroke brisk and symmetric, no bruits, no thyromegaly or adenopathy LUNGS:  Clear to auscultation bilaterally CHEST:  Unremarkable HEART:  RRR,  PMI not displaced or sustained,S1 and S2 within normal limits, no S3, no S4: no clicks, no rubs, no murmurs ABD:  Soft, nontender. BS +, no masses or bruits. No hepatomegaly, no splenomegaly EXT:  2 + pulses throughout, no edema, no cyanosis no clubbing SKIN:  Warm and dry.  No  rashes NEURO:  Alert and oriented x 3. Cranial nerves II through XII intact. PSYCH:  Cognitively intact    Wt Readings from Last 3 Encounters:  03/15/17 285 lb 12.8 oz (129.6 kg)  11/09/16 284 lb 3.2 oz (128.9 kg)  08/27/16 291 lb 12.8 oz (132.4 kg)     Studies/Labs Reviewed:   EKG:  EKG is not ordered today.   Recent Labs: No results found for requested labs within last 8760 hours.   Lipid Panel    Component Value Date/Time   CHOL 199 10/12/2014 0745   TRIG 113 10/12/2014 0745   HDL 53 10/12/2014 0745   CHOLHDL 3.8 10/12/2014 0745   VLDL 23 10/12/2014 0745   LDLCALC 123 (H) 10/12/2014 0745    Additional studies/ records that were reviewed today include:   Cardiac Catheterization: 10/2014  Mild nonobstructive CAD  Severe pulmonary HTN  Elevated LV filling pressures.  Normal cardiac output   Recommendations: continue medical therapy. Needs additional IV diuresis.  TEE: 12/2014 Study Conclusions  - Left ventricle: Systolic function was normal. The estimated   ejection fraction was in the range of 50% to 55%. - Aortic valve: No evidence of vegetation. There was moderate   regurgitation. - Mitral valve: No evidence of vegetation. - Left atrium: No evidence of thrombus in the atrial cavity or   appendage. - Pulmonic valve: No evidence of vegetation.  Assessment:    1. Chronic diastolic CHF (congestive heart failure) ([place])   2. LBBB (left bundle branch block)   3. Essential hypertension   4. ESRD (end stage renal disease) on dialysis [facility])      Plan:   In order of problems listed above:  1. Atypical Chest Pain -this has resolved. Low risk for being cardiac pain.  2. Chronic Diastolic CHF - TEE in 97/6734 showed a preserved EF of 50-55%. She does not appear volume overloaded by physical examination.  - volume management by HD.   3. HTN - BP well-controlled today - continue Coreg 6.25mg  BID on nondialysis days..   4. ESRD - on HD (Tuesday,  Thursday, and Saturday).   I will follow up in one year  Signed, Peter Martinique, MD, [facility] 03/15/2017 4:13 PM    [facility] [place], [place], [place]  [address] Phone: [phone]; Fax: [phone]  [address], [address]  [place], [place] [address] Phone: [phone]

## 2017-03-15 NOTE — Patient Instructions (Signed)
Continue your current therapy  I will see you in one year   

## 2017-03-21 ENCOUNTER — Ambulatory Visit: Payer: Medicare Other | Admitting: Family Medicine

## 2017-03-28 ENCOUNTER — Other Ambulatory Visit: Payer: Self-pay | Admitting: Family Medicine

## 2017-07-11 ENCOUNTER — Other Ambulatory Visit: Payer: Self-pay | Admitting: Family Medicine

## 2017-09-13 ENCOUNTER — Telehealth: Payer: Self-pay | Admitting: Family Medicine

## 2017-09-13 DIAGNOSIS — Z794 Long term (current) use of insulin: Secondary | ICD-10-CM

## 2017-09-13 DIAGNOSIS — E1121 Type 2 diabetes mellitus with diabetic nephropathy: Secondary | ICD-10-CM

## 2017-09-13 NOTE — Telephone Encounter (Signed)
Copied from [place] [id number]. Topic: Quick Communication - Rx Refill/Question >> Sep 13, 2017 12:20 PM Boyd Kerbs wrote: Medication:  LEVEMIR FLEXTOUCH 100 UNIT/ML Pen Needles, lanets, test strips  Has the patient contacted their pharmacy? No. (Agent: If no, request that the patient contact the pharmacy for the refill.) (Agent: If yes, when and what did the pharmacy advise?)  Preferred Pharmacy (with phone number or street name):   [id number] [place], [address] [place] [address] Phone: [phone] Fax: [phone]    Agent: Please be advised that RX refills may take up to 3 business days. We ask that you follow-up with your pharmacy.

## 2017-09-16 ENCOUNTER — Other Ambulatory Visit: Payer: Self-pay

## 2017-09-16 NOTE — Telephone Encounter (Signed)
The patient that Levemir was prescribed by her doctor in [place]. Would Dr. Caryl Bis like to refill?

## 2017-09-16 NOTE — Telephone Encounter (Signed)
Current prescription for needles has expired. Pt will need new rxs for lancets, needles and test strips. [facility] [place]   LOV: 11/09/16 Next OV;09/17/17 PCP: Dr[facility] [place]

## 2017-09-17 ENCOUNTER — Ambulatory Visit (INDEPENDENT_AMBULATORY_CARE_PROVIDER_SITE_OTHER): Payer: Medicare Other | Admitting: Family Medicine

## 2017-09-17 ENCOUNTER — Encounter: Payer: Self-pay | Admitting: Family Medicine

## 2017-09-17 VITALS — BP 108/64 | HR 74 | Temp 97.8°F | Wt 284.4 lb

## 2017-09-17 DIAGNOSIS — Z1329 Encounter for screening for other suspected endocrine disorder: Secondary | ICD-10-CM | POA: Diagnosis not present

## 2017-09-17 DIAGNOSIS — H579 Unspecified disorder of eye and adnexa: Secondary | ICD-10-CM

## 2017-09-17 MED ORDER — GLUCOSE BLOOD VI STRP: ORAL_STRIP | 12 refills | Status: DC

## 2017-09-17 MED ORDER — ONETOUCH ULTRASOFT LANCETS MISC: 12 refills | Status: DC

## 2017-09-17 MED ORDER — INSULIN PEN NEEDLE 32G X 4 MM MISC: 5 refills | Status: DC

## 2017-09-17 NOTE — Progress Notes (Signed)
Subjective:    Patient ID: Jordan [name], female    DOB: [birth year], 77 y.o.   MRN: [id number]  HPI  Ms. [name] is a 77 year old female who presents today for an evaluation of her left eye and thyroid.  She has a history of systolic CHF, diastolic CHF, COPD, OSA, I3JA with nephropathy, GERD, HTN, Pulmonary HTN, ESRD on dialysis. She reports that she was told by a tech in dialysis that her left eye looked like it was "bulging out" and she should have her thyroid checked. She reports that she does not see a difference between her right and left eyes at this time and she also states that she did not notice specific bulging but wanted to have this checked out.   Pertinent negative or absent signs and symptoms are as follows: Constitutional: No significant change in weight; significant fatigue; sleep disorder; change in appetite. Eye: no blurred, double ,loss of vision Cardiovascular: no palpitations; racing; irregularity ENT/GI: no constipation; diarrhea;hoarseness;dysphagia Derm: no change in nails,hair,skin Neuro: no change in neuropathy Psych:no anxiety; depression; panic attacks Endo: no temperature intolerance to heat ,cold   Review of Systems  Constitutional: Negative for chills, fatigue and fever.  Eyes: Negative for visual disturbance.  Respiratory: Negative for cough, shortness of breath and wheezing.   Cardiovascular: Negative for chest pain and palpitations.  Gastrointestinal: Negative for abdominal pain, diarrhea, nausea and vomiting.  Musculoskeletal: Negative for myalgias.  Skin: Negative for rash.   Past Medical History:  Diagnosis Date  . Arthritis   . Breast cancer ([place])   . CAD (coronary artery disease)    a. minimal CAD by cath in 10/2014.  Marland Kitchen CKD (chronic kidney disease)   . COPD (chronic obstructive pulmonary disease) ([place])   . Diabetes mellitus with ESRD (end-stage renal disease) ([place])   . GERD (gastroesophageal reflux disease)   . Hypertension    . Renal disorder    Family reports acute renal failure  . Sleep apnea    does not wear CPAP  . Systolic CHF, chronic ([place]) 12/23/2014     Social History   Socioeconomic History  . Marital status: Widowed    Spouse name: Not on file  . Number of children: Not on file  . Years of education: Not on file  . Highest education level: Not on file  Occupational History  . Not on file  Social Needs  . Financial resource strain: Not on file  . Food insecurity:    Worry: Not on file    Inability: Not on file  . Transportation needs:    Medical: Not on file    Non-medical: Not on file  Tobacco Use  . Smoking status: Former Research scientist (life sciences)  . Smokeless tobacco: Never Used  Substance and Sexual Activity  . Alcohol use: No    Alcohol/week: 0.0 oz  . Drug use: No  . Sexual activity: Not on file  Lifestyle  . Physical activity:    Days per week: Not on file    Minutes per session: Not on file  . Stress: Not on file  Relationships  . Social connections:    Talks on phone: Not on file    Gets together: Not on file    Attends religious service: Not on file    Active member of club or organization: Not on file    Attends meetings of clubs or organizations: Not on file    Relationship status: Not on file  . Intimate partner violence:  Fear of current or ex partner: Not on file    Emotionally abused: Not on file    Physically abused: Not on file    Forced sexual activity: Not on file  Other Topics Concern  . Not on file  Social History Narrative  . Not on file    Past Surgical History:  Procedure Laterality Date  . ARTERIOVENOUS GRAFT PLACEMENT Right 10/05/15  . AV FISTULA PLACEMENT Right 10/26/2014   Procedure: Right Brachiocephalic Arteriovenous FISTULA CREATION;  Surgeon: Conrad Shelby, MD;  Location: [place];  Service: Vascular;  Laterality: Right;  . AV FISTULA PLACEMENT Right 10/05/2015   Procedure: INSERTION OF  ARTERIOVENOUS (AV) GORE-TEX GRAFT RIGHT ARM;  Surgeon: Conrad Gilberton,  MD;  Location: [place];  Service: Vascular;  Laterality: Right;  . BASCILIC VEIN TRANSPOSITION Right 04/13/2015   Procedure: RIGHT FIRST STAGE BASCILIC VEIN TRANSPOSITION;  Surgeon: Conrad Earlville, MD;  Location: [place];  Service: Vascular;  Laterality: Right;  . BASCILIC VEIN TRANSPOSITION Right 07/11/2015   Procedure: SECOND STAGE BASILIC VEIN TRANSPOSITION;  Surgeon: Conrad Greenfield, MD;  Location: [place];  Service: Vascular;  Laterality: Right;  . BREAST SURGERY Left   . CARDIAC CATHETERIZATION [name] 10/19/2014   Procedure: Right/Left Heart Cath and Coronary Angiography;  Surgeon: Peter M Martinique, MD;  Location: [place] [facility];  Service: Cardiovascular;  Laterality: [name];  . FISTULA SUPERFICIALIZATION Right 01/26/2015   Procedure: Right BRACHIOCEPHALIC ARTERIOVENOUS FISTULA SUPERFICIALIZATION with side branch ligation;  Surgeon: Conrad [name], MD;  Location: [place];  Service: Vascular;  Laterality: Right;  . IR DIALY SHUNT INTRO [place] W/IMG RIGHT Right 08/23/2016  . IR GENERIC HISTORICAL  04/30/2016   IR REMOVAL TUN CV CATH W/O FL 04/30/2016 Aletta Edouard, MD [facility]  . IR US GUIDE VASC ACCESS RIGHT  08/23/2016  . LIGATION OF ARTERIOVENOUS  FISTULA Right 03/07/2015   Procedure: LIGATION OF RIGHT BRACHIOCEPHALIC ARTERIOVENOUS  FISTULA;  Surgeon: Conrad Iberia, MD;  Location: [place];  Service: Vascular;  Laterality: Right;  . LIGATION OF ARTERIOVENOUS  FISTULA Right 10/05/2015   Procedure: LIGATION OF RIGHT BASILIC VEIN TRANSPOSITION; EXCISION OF CICATRIX;  Surgeon: Conrad Conejos, MD;  Location: [place];  Service: Vascular;  Laterality: Right;  . PERIPHERAL VASCULAR CATHETERIZATION [name] 01/24/2015   Procedure: Fistulagram;  Surgeon: Conrad Bee, MD;  Location: [place] [facility];  Service: Cardiovascular;  Laterality: [name];  . PERIPHERAL VASCULAR CATHETERIZATION [name] 08/29/2015   Procedure: Fistulagram;  Surgeon: Conrad Greeley, MD;  Location: [place] [facility];  Service: Cardiovascular;   Laterality: [name];  . PERIPHERAL VASCULAR CATHETERIZATION Right 08/29/2015   Procedure: Peripheral Vascular Balloon Angioplasty;  Surgeon: Conrad Perry, MD;  Location: [place] [facility];  Service: Cardiovascular;  Laterality: Right;  arm fistula  . TEE WITHOUT CARDIOVERSION [name] 12/27/2014   Procedure: TRANSESOPHAGEAL ECHOCARDIOGRAM (TEE);  Surgeon: Thayer Headings, MD;  Location: [place];  Service: Cardiovascular;  Laterality: [name];  . THROMBECTOMY W/ EMBOLECTOMY Right 03/07/2015   Procedure: THROMBECTOMY OF RIGHT BRACHIOCEPHALIC ARTERIOVENOUS FISTULA;  Surgeon: Conrad , MD;  Location: [place];  Service: Vascular;  Laterality: Right;  . THROMBECTOMY W/ EMBOLECTOMY Right 01/27/2016   Procedure: THROMBECTOMY AND REVISION OF RIGHT UPPER ARM ARTERIOVENOUS GORE-TEX GRAFT;  Surgeon: Angelia Mould, MD;  Location: [facility];  Service: Vascular;  Laterality: Right;    Family History  Problem Relation Age of Onset  . Diabetes Mother   . Hypertension Mother   .  Stroke Sister   . Hypertension Sister   . Heart attack Neg Hx     Allergies  Allergen Reactions  . Vicodin [Hydrocodone-Acetaminophen] Itching  . Adhesive [Tape] Itching, Rash and Other (See Comments)    Please use "paper" tape    Current Outpatient Medications on File Prior to Visit  Medication Sig Dispense Refill  . aspirin 81 MG tablet Take 81 mg by mouth every Monday,Wednesday,Friday, and Sunday at 6 PM.     . calcium carbonate (TUMS EX) 750 MG chewable tablet Chew 750 mg by mouth 3 (three) times daily with meals.     . carvedilol (COREG) 6.25 MG tablet Take 1 tablet (6.25 mg total) by mouth See admin instructions. Take 6.25 mg by mouth twice daily on Monday, Wednesday, Friday and Sunday. 60 tablet 3  . gabapentin (NEURONTIN) 300 MG capsule Take 300 mg by mouth every other day.    Marland Kitchen LEVEMIR FLEXTOUCH 100 UNIT/ML Pen INJECT 55 UNITS INTO THE SKIN AT BEDTIME. 15 pen 2  . midodrine (PROAMATINE) 5 MG tablet TAKE 1 TABLET BY  MOUTH EVERY OTHER DAY 30-60 MINUTES BEFORE DIALYSIS AND 1/2 TABLET DURING DIALYSIS  11   No current facility-administered medications on file prior to visit.     BP 108/64 (BP Location: Left Arm, Patient Position: Sitting, Cuff Size: Normal)   Pulse 74   Temp 97.8 F (36.6 C) (Oral)   Wt 284 lb 6 oz (129 kg)   SpO2 98%   BMI 47.32 kg/m        Objective:   Physical Exam  Constitutional: She is oriented to person, place, and time.  Morbidly obese female; ambulates with walker  Eyes: Pupils are equal, round, and reactive to light. Conjunctivae, EOM and lids are normal. No scleral icterus.  No lid lag, proptosis, nystagmus  Neck: Neck supple. No thyromegaly present.  Cardiovascular: Normal rate, regular rhythm and normal heart sounds.  Pulmonary/Chest: Effort normal and breath sounds normal. No stridor. She has no wheezes.  Abdominal: Soft. Bowel sounds are normal. There is no tenderness.  Lymphadenopathy:    She has no cervical adenopathy.  Neurological: She is alert and oriented to person, place, and time.  Skin: Skin is warm and dry. No rash noted.  Psychiatric: She has a normal mood and affect. Her behavior is normal. Judgment and thought content normal.       Assessment & Plan:  1. Left eye complaint Exam and history are reassuring today. No evidence of proptosis, visual changes, and negative ROS. No symptoms of hyper/hypothyroidism noted today. Thyroid WNL per exam today. We discussed the option of obtaining lab work for evaluation of thyroid function if patient wanted further evaluation. She obtains lab work at her dialysis center and states that she is not interested at this time. If she would like lab work completed, she will contact this office and lab orders can be placed. She reports feeling well overall at this time and due to difficulty obtaining blood for lab work, she would like to defer further evaluation.   2. Screening for thyroid disorder Exam and history are  reassuring. Thyroid WNL today. Patient politely declined lab work today. ROS negative. She will contact this office if she would like to have TSH level completed. Discussed signs/symptoms of thyroid dysfunction and also reviewed TSH testing.  Advised follow up with PCP as recommended.  Delano Metz, FNP-C

## 2017-09-17 NOTE — Telephone Encounter (Signed)
Patient is coming in today to see Gregary Signs, Could you please ask patient what kind of meter she has so we can refill her supplies

## 2017-09-17 NOTE — Patient Instructions (Signed)
See information below about thyroid evaluation through blood work.   Thyroid-Stimulating Hormone Test Why am I having this test? A thyroid-stimulating hormone (TSH) test is a blood test that is done to measure the level of TSH, also known as thyrotropin, in your blood. TSH is produced by the pituitary gland. The pituitary gland is a small organ located just below the brain, behind your eyes and nasal passages. It is part of a system that monitors and maintains thyroid hormone levels and thyroid gland function. Thyroid hormones affect many body parts and systems, including the system that affects how quickly your body burns fuel for energy. Your health care provider may recommend testing your TSH level if you have signs and symptoms of abnormal thyroid hormone levels. Knowing the level of TSH in your blood can help your health care provider:  Diagnose a thyroid gland or pituitary gland disorder.  Manage your condition and treatment if you have hypothyroidism or hyperthyroidism.  What kind of sample is taken? A blood sample is required for this test. It is usually collected by inserting a needle into a vein. How do I prepare for this test? There is no preparation required for this test. What are the reference ranges? Reference rangesare considered healthy rangesestablished after testing a large group of healthy people. Reference rangesmay vary among different people, labs, and hospitals. It is your responsibility to obtain your test results. Ask the lab or department performing the test when and how you will get your results. Range of Normal Values:  Adult: 0.3-5 microunits/mL or 0.3-5 milliunits/L (SI units).  Newborn: 59-18 microunits/mL or 3-18 milliunits/L.  Cord: 3-12 microunits/mL or 3-12 milliunits/L.  What do the results mean? A high level of TSH may mean:  Your thyroid gland is not making enough thyroid hormones. When the thyroid gland does not make enough thyroid hormones, the  pituitary gland releases TSH into the bloodstream. The higher-than-normal levels of TSH prompt the thyroid gland to release more thyroid hormones.  You are getting an insufficient level of thyroid hormone medicine, if you are receiving this type of treatment.  There is a problem with the pituitary gland (rare).  A low level of TSH can indicate a problem with the pituitary gland. Talk with your health care provider to discuss your results, treatment options, and if necessary, the need for more tests. Talk with your health care provider if you have any questions about your results. Talk with your health care provider to discuss your results, treatment options, and if necessary, the need for more tests. Talk with your health care provider if you have any questions about your results. This information is not intended to replace advice given to you by your health care provider. Make sure you discuss any questions you have with your health care provider. Document Released: 04/27/2004 Document Revised: 12/04/2015 Document Reviewed: 08/26/2013 Elsevier Interactive Patient Education  [employer].

## 2017-09-17 NOTE — Telephone Encounter (Signed)
Script sent for diabetic supplies.  Patient is requesting script for diabetic shoes when see was seen by Almyra Free, NP

## 2017-09-18 NOTE — Telephone Encounter (Signed)
I can not provide with a prescription for diabetic shoes at this time as she has not been seen by me in the past 6 months.  Please get her scheduled for follow-up.

## 2017-09-19 ENCOUNTER — Telehealth: Payer: Self-pay | Admitting: Family Medicine

## 2017-09-19 ENCOUNTER — Other Ambulatory Visit: Payer: Self-pay

## 2017-09-19 DIAGNOSIS — E1121 Type 2 diabetes mellitus with diabetic nephropathy: Secondary | ICD-10-CM

## 2017-09-19 DIAGNOSIS — Z794 Long term (current) use of insulin: Secondary | ICD-10-CM

## 2017-09-19 MED ORDER — ONETOUCH ULTRASOFT LANCETS MISC: 12 refills | Status: DC

## 2017-09-19 NOTE — Telephone Encounter (Signed)
Rx resent with directions and how many times a day to use.

## 2017-09-19 NOTE — Telephone Encounter (Signed)
Copied from [place] [phone]. Topic: Quick Communication - See Telephone Encounter >> Sep 19, 2017  1:05 PM Selinda Flavin B, NT wrote: CRM for notification. See Telephone encounter for: 09/19/17. Vicente Males with [facility] calling and states that they are trying to bill Medicare Part B for the Lancets (ONETOUCH ULTRASOFT) lancets. States that Medicare is needing the instruction to include how many times a day to use the lancets. Please advise.  CB#: [phone]

## 2017-09-19 NOTE — Telephone Encounter (Signed)
Appointment scheduled for 12/18/17.  I offered early appointment however patient declined due to conflict with dialysis and being out of town.

## 2017-12-06 ENCOUNTER — Encounter: Payer: Medicare Other | Admitting: Family Medicine

## 2017-12-06 ENCOUNTER — Encounter

## 2017-12-18 ENCOUNTER — Ambulatory Visit: Payer: Medicare Other | Admitting: Family Medicine

## 2018-01-24 DIAGNOSIS — I519 Heart disease, unspecified: Secondary | ICD-10-CM | POA: Insufficient documentation

## 2018-01-24 DIAGNOSIS — N186 End stage renal disease: Secondary | ICD-10-CM | POA: Insufficient documentation

## 2018-03-03 ENCOUNTER — Observation Stay (HOSPITAL_COMMUNITY)
Admission: EM | Admit: 2018-03-03 | Discharge: 2018-03-05 | Disposition: A | Discharge status: Home / Self Care | Payer: Medicare Other | Attending: Internal Medicine | Admitting: Internal Medicine

## 2018-03-03 ENCOUNTER — Encounter (HOSPITAL_COMMUNITY): Payer: Self-pay

## 2018-03-03 ENCOUNTER — Other Ambulatory Visit: Payer: Self-pay

## 2018-03-03 ENCOUNTER — Emergency Department (HOSPITAL_COMMUNITY): Payer: Medicare Other

## 2018-03-03 ENCOUNTER — Ambulatory Visit: Payer: Self-pay | Admitting: *Deleted

## 2018-03-03 ENCOUNTER — Observation Stay (HOSPITAL_COMMUNITY): Payer: Medicare Other

## 2018-03-03 DIAGNOSIS — Z7982 Long term (current) use of aspirin: Secondary | ICD-10-CM | POA: Insufficient documentation

## 2018-03-03 DIAGNOSIS — I252 Old myocardial infarction: Secondary | ICD-10-CM | POA: Insufficient documentation

## 2018-03-03 DIAGNOSIS — J449 Chronic obstructive pulmonary disease, unspecified: Secondary | ICD-10-CM | POA: Diagnosis not present

## 2018-03-03 DIAGNOSIS — E1122 Type 2 diabetes mellitus with diabetic chronic kidney disease: Secondary | ICD-10-CM | POA: Insufficient documentation

## 2018-03-03 DIAGNOSIS — I2510 Atherosclerotic heart disease of native coronary artery without angina pectoris: Secondary | ICD-10-CM | POA: Diagnosis present

## 2018-03-03 DIAGNOSIS — Z794 Long term (current) use of insulin: Secondary | ICD-10-CM | POA: Insufficient documentation

## 2018-03-03 DIAGNOSIS — Z853 Personal history of malignant neoplasm of breast: Secondary | ICD-10-CM | POA: Insufficient documentation

## 2018-03-03 DIAGNOSIS — Z79899 Other long term (current) drug therapy: Secondary | ICD-10-CM | POA: Insufficient documentation

## 2018-03-03 DIAGNOSIS — R778 Other specified abnormalities of plasma proteins: Secondary | ICD-10-CM

## 2018-03-03 DIAGNOSIS — E8889 Other specified metabolic disorders: Secondary | ICD-10-CM | POA: Insufficient documentation

## 2018-03-03 DIAGNOSIS — E119 Type 2 diabetes mellitus without complications: Secondary | ICD-10-CM | POA: Diagnosis present

## 2018-03-03 DIAGNOSIS — D638 Anemia in other chronic diseases classified elsewhere: Secondary | ICD-10-CM

## 2018-03-03 DIAGNOSIS — N186 End stage renal disease: Secondary | ICD-10-CM | POA: Insufficient documentation

## 2018-03-03 DIAGNOSIS — G4733 Obstructive sleep apnea (adult) (pediatric): Secondary | ICD-10-CM | POA: Insufficient documentation

## 2018-03-03 DIAGNOSIS — R7989 Other specified abnormal findings of blood chemistry: Secondary | ICD-10-CM

## 2018-03-03 DIAGNOSIS — D631 Anemia in chronic kidney disease: Secondary | ICD-10-CM | POA: Diagnosis not present

## 2018-03-03 DIAGNOSIS — I272 Pulmonary hypertension, unspecified: Secondary | ICD-10-CM | POA: Insufficient documentation

## 2018-03-03 DIAGNOSIS — I428 Other cardiomyopathies: Secondary | ICD-10-CM | POA: Diagnosis not present

## 2018-03-03 DIAGNOSIS — Z87891 Personal history of nicotine dependence: Secondary | ICD-10-CM | POA: Diagnosis not present

## 2018-03-03 DIAGNOSIS — N2581 Secondary hyperparathyroidism of renal origin: Secondary | ICD-10-CM | POA: Diagnosis not present

## 2018-03-03 DIAGNOSIS — E1140 Type 2 diabetes mellitus with diabetic neuropathy, unspecified: Secondary | ICD-10-CM

## 2018-03-03 DIAGNOSIS — Z6841 Body Mass Index (BMI) 40.0 and over, adult: Secondary | ICD-10-CM | POA: Diagnosis not present

## 2018-03-03 DIAGNOSIS — I5032 Chronic diastolic (congestive) heart failure: Secondary | ICD-10-CM | POA: Diagnosis not present

## 2018-03-03 DIAGNOSIS — I10 Essential (primary) hypertension: Secondary | ICD-10-CM | POA: Diagnosis present

## 2018-03-03 DIAGNOSIS — R42 Dizziness and giddiness: Principal | ICD-10-CM | POA: Insufficient documentation

## 2018-03-03 DIAGNOSIS — K219 Gastro-esophageal reflux disease without esophagitis: Secondary | ICD-10-CM | POA: Insufficient documentation

## 2018-03-03 DIAGNOSIS — Z992 Dependence on renal dialysis: Secondary | ICD-10-CM | POA: Diagnosis not present

## 2018-03-03 DIAGNOSIS — I132 Hypertensive heart and chronic kidney disease with heart failure and with stage 5 chronic kidney disease, or end stage renal disease: Secondary | ICD-10-CM | POA: Diagnosis not present

## 2018-03-03 DIAGNOSIS — E1151 Type 2 diabetes mellitus with diabetic peripheral angiopathy without gangrene: Secondary | ICD-10-CM | POA: Diagnosis not present

## 2018-03-03 LAB — BASIC METABOLIC PANEL
ANION GAP: 11 (ref 5–15)
BUN: 33 mg/dL — AB (ref 8–23)
CHLORIDE: 98 mmol/L (ref 98–111)
CO2: 25 mmol/L (ref 22–32)
Calcium: 8.6 mg/dL — ABNORMAL LOW (ref 8.9–10.3)
Creatinine, Ser: 10.19 mg/dL — ABNORMAL HIGH (ref 0.44–1.00)
GFR calc Af Amer: 4 mL/min — ABNORMAL LOW (ref >60)
GFR, EST NON AFRICAN AMERICAN: 3 mL/min — AB (ref >60)
GLUCOSE: 139 mg/dL — AB (ref 70–99)
POTASSIUM: 4.7 mmol/L (ref 3.5–5.1)
Sodium: 134 mmol/L — ABNORMAL LOW (ref 135–145)

## 2018-03-03 LAB — CBC
HEMATOCRIT: 35.7 % — AB (ref 36.0–46.0)
HEMOGLOBIN: 10.5 g/dL — AB (ref 12.0–15.0)
MCH: 26.8 pg (ref 26.0–34.0)
MCHC: 29.4 g/dL — AB (ref 30.0–36.0)
MCV: 91.1 fL (ref 80.0–100.0)
Platelets: 181 10*3/uL (ref 150–400)
RBC: 3.92 MIL/uL (ref 3.87–5.11)
RDW: 15.2 % (ref 11.5–15.5)
WBC: 7.6 10*3/uL (ref 4.0–10.5)
nRBC: 0 % (ref 0.0–0.2)

## 2018-03-03 LAB — CBG MONITORING, ED: Glucose-Capillary: 129 mg/dL — ABNORMAL HIGH (ref 70–99)

## 2018-03-03 LAB — I-STAT TROPONIN, ED
TROPONIN I, POC: 0.24 ng/mL — AB (ref 0.00–0.08)
Troponin i, poc: 0.3 ng/mL (ref 0.00–0.08)

## 2018-03-03 LAB — TROPONIN I: Troponin I: 0.22 ng/mL (ref <0.03)

## 2018-03-03 LAB — GLUCOSE, CAPILLARY: Glucose-Capillary: 154 mg/dL — ABNORMAL HIGH (ref 70–99)

## 2018-03-03 MED ORDER — CARVEDILOL 6.25 MG PO TABS
6.2500 mg | ORAL_TABLET | ORAL | Status: DC
  Administered 2018-03-05: 6.25 mg via ORAL
  Filled 2018-03-03: qty 1

## 2018-03-03 MED ORDER — CARVEDILOL 12.5 MG PO TABS: 6.2500 mg | ORAL_TABLET | ORAL | Status: DC

## 2018-03-03 MED ORDER — CLOTRIMAZOLE 1 % EX CREA: 1.0000 "application " | TOPICAL_CREAM | Freq: Two times a day (BID) | CUTANEOUS | Status: DC | PRN

## 2018-03-03 MED ORDER — CALCIUM CARBONATE ANTACID 500 MG PO CHEW
1000.0000 mg | CHEWABLE_TABLET | Freq: Three times a day (TID) | ORAL | Status: DC
  Administered 2018-03-04 – 2018-03-05 (×4): 1000 mg via ORAL
  Filled 2018-03-03 (×3): qty 5

## 2018-03-03 MED ORDER — HEPARIN SODIUM (PORCINE) 5000 UNIT/ML IJ SOLN
5000.0000 [IU] | Freq: Three times a day (TID) | INTRAMUSCULAR | Status: DC
  Administered 2018-03-03 – 2018-03-05 (×5): 5000 [IU] via SUBCUTANEOUS
  Filled 2018-03-03 (×4): qty 1

## 2018-03-03 MED ORDER — ADULT MULTIVITAMIN W/MINERALS CH
1.0000 | ORAL_TABLET | ORAL | Status: DC
  Administered 2018-03-05: 1 via ORAL
  Filled 2018-03-03: qty 1

## 2018-03-03 MED ORDER — INSULIN DETEMIR 100 UNIT/ML ~~LOC~~ SOLN
55.0000 [IU] | Freq: Every day | SUBCUTANEOUS | Status: DC
  Administered 2018-03-03 – 2018-03-04 (×2): 55 [IU] via SUBCUTANEOUS
  Filled 2018-03-03 (×3): qty 0.55

## 2018-03-03 MED ORDER — IPRATROPIUM-ALBUTEROL 0.5-2.5 (3) MG/3ML IN SOLN: 3.0000 mL | Freq: Four times a day (QID) | RESPIRATORY_TRACT | Status: DC | PRN

## 2018-03-03 MED ORDER — ASPIRIN EC 81 MG PO TBEC
81.0000 mg | DELAYED_RELEASE_TABLET | ORAL | Status: DC
  Administered 2018-03-05: 81 mg via ORAL
  Filled 2018-03-03: qty 1

## 2018-03-03 MED ORDER — PROMETHAZINE HCL 25 MG/ML IJ SOLN: 6.2500 mg | Freq: Three times a day (TID) | INTRAMUSCULAR | Status: DC | PRN

## 2018-03-03 MED ORDER — CARVEDILOL 6.25 MG PO TABS
6.2500 mg | ORAL_TABLET | ORAL | Status: DC
  Administered 2018-03-04: 6.25 mg via ORAL
  Filled 2018-03-03: qty 1

## 2018-03-03 MED ORDER — INSULIN ASPART 100 UNIT/ML ~~LOC~~ SOLN
0.0000 [IU] | Freq: Three times a day (TID) | SUBCUTANEOUS | Status: DC
  Administered 2018-03-04: 1 [IU] via SUBCUTANEOUS
  Administered 2018-03-05: 2 [IU] via SUBCUTANEOUS

## 2018-03-03 MED ORDER — MIDODRINE HCL 5 MG PO TABS
5.0000 mg | ORAL_TABLET | ORAL | Status: DC
  Administered 2018-03-05: 5 mg via ORAL
  Filled 2018-03-03: qty 1

## 2018-03-03 MED ORDER — GABAPENTIN 300 MG PO CAPS
300.0000 mg | ORAL_CAPSULE | ORAL | Status: DC
  Administered 2018-03-05: 300 mg via ORAL
  Filled 2018-03-03: qty 1

## 2018-03-03 MED ORDER — PANTOPRAZOLE SODIUM 20 MG PO TBEC
20.0000 mg | DELAYED_RELEASE_TABLET | ORAL | Status: DC
  Administered 2018-03-05: 20 mg via ORAL
  Filled 2018-03-03: qty 1

## 2018-03-03 NOTE — ED Provider Notes (Signed)
  Face-to-face evaluation   History: 1540 p.m. She reports onset of dizziness with nausea, this morning.  She describes the dizziness as lightheadedness which occurs when she moves her head or tries to sit up.  She has not eaten yet today and is hungry.  Denies headache, neck pain, back pain, chest pain or abdominal pain.  She had a similar episode of dizziness several days ago while she was on dialysis, which caused her to stop dialysis early.  She is due for repeat dialysis, tomorrow.  Physical exam: Elderly, alert, obese.  No dysarthria or aphasia.  Normal strength arms and legs bilaterally.  There is abnormal nystagmus, right beating, and rotatory with single eye cover/uncover consistent with positive test of skew.  Negative head impulse testing.  MDM-screen for metabolic abnormalities, cardiac disorders and acute CVA.  Troponin high, EKG not mastic for acute ischemia or infarct.  Will repeat EKG and troponin.  Medical screening examination/treatment/procedure(s) were conducted as a shared visit with non-physician practitioner(s) and myself.  I personally evaluated the patient during the encounter    Daleen Bo, MD 03/04/18 [phone]

## 2018-03-03 NOTE — Telephone Encounter (Signed)
Patient phoned with dizziness and room spinning upon waking this morning. She also complains of left-sided chest heaviness at this time-7 on the scale.Reports some blurred vision this morning. She has been having the dizziness off/on over the last week. Had to be removed off hemodialysis on Saturday due to hypotension. Instructed patient to call 911 at this time. Stated she would call 911. Her daughter is present and will support her until arrival of EMS.  Reason for Disposition . [1] Dizziness (vertigo) present now AND [2] one or more stroke risk factors (i.e., hypertension, diabetes, prior stroke/TIA/heart attack)  (Exception: prior physician evaluation for this AND no different/worse than usual)  Answer Assessment - Initial Assessment Questions 1. DESCRIPTION: "Describe your dizziness."    Dizziness with the room spinning 2. VERTIGO: "Do you feel like either you or the room is spinning or tilting?"      yes 3. LIGHTHEADED: "Do you feel lightheaded?" (e.g., somewhat faint, woozy, weak upon standing)     Yes, afraid to stand 4. SEVERITY: "How bad is it?"  "Can you walk?"   - MILD - Feels unsteady but walking normally.   - MODERATE - Feels very unsteady when walking, but not falling; interferes with normal activities (e.g., school, work) .   - SEVERE - Unable to walk without falling (requires assistance).     Severe todya 5. ONSET:  "When did the dizziness begin?"     Began experiencing dizziness last week that improved for several days-worsened again on/off Saturday and Sunday. Woke up today with severe vertigo. 6. AGGRAVATING FACTORS: "Does anything make it worse?" (e.g., standing, change in head position)     She is afraid to move around at this point.  7. CAUSE: "What do you think is causing the dizziness?"     Unsure. 8. RECURRENT SYMPTOM: "Have you had dizziness before?" If so, ask: "When was the last time?" "What happened that time?"    Yesterday she had dizziness but not quite as bad  as today. 9. OTHER SYMPTOMS: "Do you have any other symptoms?" (e.g., headache, weakness, numbness, vomiting, earache)     Blurred vision and left-sided chest heaviness today. 10. PREGNANCY: "Is there any chance you are pregnant?" "When was your last menstrual period?"       no  Protocols used: DIZZINESS - VERTIGO-A-AH

## 2018-03-03 NOTE — ED Notes (Signed)
RN and IV team unable to obtain IV. ED PA made aware.

## 2018-03-03 NOTE — H&P (Signed)
History and Physical    Jordan [name] YSA:[id number] DOB: 04/24/1940 DOA: 03/03/2018  PCP: Leone Haven, MD Patient coming from: Home  Chief Complaint: Dizziness, nausea  HPI: Jordan [name] is a 77 y.o. female with medical history significant of CAD, COPD, ESRD on HD (TTS), type 2 diabetes, hypertension, chronic diastolic congestive heart failure presenting to the hospital for evaluation of dizziness and nausea.  Symptoms started 2 weeks ago.  Patient has been feeling intermittently dizzy, almost on a daily basis.  States 3 days ago her dizziness was associated with nausea at dialysis and she was told her blood pressure was too low and dialysis had to be stopped prematurely.  She again felt dizzy yesterday and this morning her vision was blurry when watching television.  Blurriness of vision lasted about 2 hours and it is not blurry anymore.  She denies having a room spinning sensation and describes the dizziness more like lightheadedness.  It is worse with head movement.  States her appetite has been okay.  She did vomit once today and some fluid came out.  She is not nauseous at this time.  Denies having any headaches.  Denies having any chest pain, shortness of breath, fevers, chills, or abdominal pain.  ED Course: Afebrile and hemodynamically stable.  No leukocytosis.  I-STAT troponin 0.3 > 0.24.  EKG not suggestive of ACS.  CT head showing no acute finding.  Review of Systems: As per HPI otherwise 10 point review of systems negative.  Past Medical History:  Diagnosis Date  . Arthritis   . Breast cancer ([place])   . CAD (coronary artery disease)    a. minimal CAD by cath in 10/2014.  Marland Kitchen CKD (chronic kidney disease)   . COPD (chronic obstructive pulmonary disease) ([place])   . Diabetes mellitus with ESRD (end-stage renal disease) ([place])   . GERD (gastroesophageal reflux disease)   . Hypertension   . Renal disorder    Family reports acute renal failure  . Sleep apnea      does not wear CPAP  . Systolic CHF, chronic ([place]) 12/23/2014    Past Surgical History:  Procedure Laterality Date  . ARTERIOVENOUS GRAFT PLACEMENT Right 10/05/15  . AV FISTULA PLACEMENT Right 10/26/2014   Procedure: Right Brachiocephalic Arteriovenous FISTULA CREATION;  Surgeon: Conrad Claymont, MD;  Location: [facility];  Service: Vascular;  Laterality: Right;  . AV FISTULA PLACEMENT Right 10/05/2015   Procedure: INSERTION OF  ARTERIOVENOUS (AV) GORE-TEX GRAFT RIGHT ARM;  Surgeon: Conrad Windber, MD;  Location: [facility];  Service: Vascular;  Laterality: Right;  . BASCILIC VEIN TRANSPOSITION Right 04/13/2015   Procedure: RIGHT FIRST STAGE BASCILIC VEIN TRANSPOSITION;  Surgeon: Conrad Franklin, MD;  Location: [facility];  Service: Vascular;  Laterality: Right;  . BASCILIC VEIN TRANSPOSITION Right 07/11/2015   Procedure: SECOND STAGE BASILIC VEIN TRANSPOSITION;  Surgeon: Conrad La Blanca, MD;  Location: [facility];  Service: Vascular;  Laterality: Right;  . BREAST SURGERY Left   . CARDIAC CATHETERIZATION [name] 10/19/2014   Procedure: Right/Left Heart Cath and Coronary Angiography;  Surgeon: Peter M Martinique, MD;  Location: [facility];  Service: Cardiovascular;  Laterality: [name];  . FISTULA SUPERFICIALIZATION Right 01/26/2015   Procedure: Right BRACHIOCEPHALIC ARTERIOVENOUS FISTULA SUPERFICIALIZATION with side branch ligation;  Surgeon: Conrad Ephraim, MD;  Location: [facility];  Service: Vascular;  Laterality: Right;  . IR DIALY SHUNT INTRO [place] W/IMG RIGHT Right 08/23/2016  . IR GENERIC HISTORICAL  04/30/2016  IR REMOVAL TUN CV CATH W/O FL 04/30/2016 Aletta Edouard, MD [facility]  . IR US GUIDE VASC ACCESS RIGHT  08/23/2016  . LIGATION OF ARTERIOVENOUS  FISTULA Right 03/07/2015   Procedure: LIGATION OF RIGHT BRACHIOCEPHALIC ARTERIOVENOUS  FISTULA;  Surgeon: Conrad McGregor, MD;  Location: [facility];  Service: Vascular;  Laterality: Right;  . LIGATION OF ARTERIOVENOUS  FISTULA Right 10/05/2015   Procedure: LIGATION OF  RIGHT BASILIC VEIN TRANSPOSITION; EXCISION OF CICATRIX;  Surgeon: Conrad Dover, MD;  Location: [facility];  Service: Vascular;  Laterality: Right;  . PERIPHERAL VASCULAR CATHETERIZATION [name] 01/24/2015   Procedure: Fistulagram;  Surgeon: Conrad Saxon, MD;  Location: [facility];  Service: Cardiovascular;  Laterality: [name];  . PERIPHERAL VASCULAR CATHETERIZATION [name] 08/29/2015   Procedure: Fistulagram;  Surgeon: Conrad Bowling Green, MD;  Location: [facility];  Service: Cardiovascular;  Laterality: [name];  . PERIPHERAL VASCULAR CATHETERIZATION Right 08/29/2015   Procedure: Peripheral Vascular Balloon Angioplasty;  Surgeon: Conrad Doddridge, MD;  Location: [facility];  Service: Cardiovascular;  Laterality: Right;  arm fistula  . TEE WITHOUT CARDIOVERSION [name] 12/27/2014   Procedure: TRANSESOPHAGEAL ECHOCARDIOGRAM (TEE);  Surgeon: Thayer Headings, MD;  Location: [facility];  Service: Cardiovascular;  Laterality: [name];  . THROMBECTOMY W/ EMBOLECTOMY Right 03/07/2015   Procedure: THROMBECTOMY OF RIGHT BRACHIOCEPHALIC ARTERIOVENOUS FISTULA;  Surgeon: Conrad Covington, MD;  Location: [facility];  Service: Vascular;  Laterality: Right;  . THROMBECTOMY W/ EMBOLECTOMY Right 01/27/2016   Procedure: THROMBECTOMY AND REVISION OF RIGHT UPPER ARM ARTERIOVENOUS GORE-TEX GRAFT;  Surgeon: Angelia Mould, MD;  Location: [facility];  Service: Vascular;  Laterality: Right;     reports that she has quit smoking. She has never used smokeless tobacco. She reports that she does not drink alcohol or use drugs.  Allergies  Allergen Reactions  . Vicodin [Hydrocodone-Acetaminophen] Itching  . Adhesive [Tape] Itching, Rash and Other (See Comments)    Please use "paper" tape    Family History  Problem Relation Age of Onset  . Diabetes Mother   . Hypertension Mother   . Stroke Sister   . Hypertension Sister   . Heart attack Neg Hx     Prior to Admission medications   Medication Sig Start Date End Date Taking? Authorizing  Provider  aspirin EC 81 MG tablet Take 81 mg by mouth See admin instructions. Take one tablet (81 mg) by mouth every Monday, Wednesday, Friday and Sunday morning (non-dialysis days)   Yes [provider]  Calcium Carbonate Antacid (TUMS ULTRA 1000 PO) Take 1,000 mg by mouth 3 (three) times daily with meals.   Yes [provider]  carvedilol (COREG) 6.25 MG tablet Take 1 tablet (6.25 mg total) by mouth See admin instructions. Take 6.25 mg by mouth twice daily on Monday, Wednesday, Friday and Sunday. Patient taking differently: Take 6.25 mg by mouth See admin instructions. Take one tablet ( 6.25 mg) by mouth twice daily on Monday, Wednesday, Friday and Sunday. (non-dialysis days) 11/09/16  Yes Leone Haven, MD  clotrimazole (LOTRIMIN) 1 % cream Apply 1 application topically 2 (two) times daily as needed (rash under breasts).  12/04/17  Yes [provider]  gabapentin (NEURONTIN) 300 MG capsule Take 300 mg by mouth See admin instructions. Take one capsule (300 mg) by mouth every Monday, Wednesday, Friday and Sunday morning (non-dialysis days)   Yes [provider]  LEVEMIR FLEXTOUCH 100 UNIT/ML Pen INJECT 55 UNITS INTO THE SKIN AT BEDTIME.  Patient taking differently: Inject 55 Units into the skin every evening.  07/11/17  Yes Leone Haven, MD  midodrine (PROAMATINE) 5 MG tablet Take 5 mg by mouth See admin instructions. Take one tablet (5 mg) by mouth during dialysis (Tuesday, Thursday, Saturday) as needed for low blood pressure <79/30 06/21/17  Yes [provider]  Multiple Vitamin (MULTIVITAMIN WITH MINERALS) TABS tablet Take 1 tablet by mouth See admin instructions. Take one tablet by mouth every Monday, Wednesday, Friday and Sunday morning (non-dialysis days) - [facility]'s [facility]   Yes [provider]  omeprazole (PRILOSEC) 20 MG capsule Take 20 mg by mouth See admin instructions. Take one capsule (20 mg) by mouth on Monday, Wednesday, Friday and  Sunday morning (non-dialysis days)   Yes [provider]  glucose blood (ONETOUCH VERIO) test strip Test once daily 09/17/17   Leone Haven, MD  Insulin Pen Needle (BD PEN NEEDLE NANO U/F) 32G X 4 MM MISC Use as directed 09/17/17   Leone Haven, MD  Lancets [facility] ULTRASOFT) lancets Use to check blood sugars 2-3 times daily as directed. 09/19/17   Leone Haven, MD    Physical Exam: Vitals:   03/03/18 1745 03/03/18 1815 03/03/18 1830 03/03/18 1845  BP: (!) 156/65 (!) 152/67 (!) 129/47 (!) 147/61  Pulse: 82 83 78 77  Resp: 10 14 15 14   Temp:      TempSrc:      SpO2: 92% 93% 93% 91%  Weight:      Height:        Physical Exam  Constitutional: She is oriented to person, place, and time. She appears well-developed and well-nourished. No distress.  HENT:  Head: Normocephalic and atraumatic.  Mouth/Throat: Oropharynx is clear and moist.  Horizontal nystagmus when looking to the right  Eyes: Pupils are equal, round, and reactive to light. Right eye exhibits no discharge. Left eye exhibits no discharge.  Neck: Neck supple. No tracheal deviation present.  Cardiovascular: Normal rate, regular rhythm and intact distal pulses.  Pulmonary/Chest: Effort normal and breath sounds normal. No respiratory distress. She has no wheezes. She has no rales.  Abdominal: Soft. Bowel sounds are normal. She exhibits no distension. There is no tenderness. There is no guarding.  Musculoskeletal: She exhibits no edema.  Neurological: She is alert and oriented to person, place, and time. No cranial nerve deficit.  Tongue midline No slurring of speech Strength 5 out of 5 in bilateral upper and lower extremities Sensation to light touch intact throughout  Skin: Skin is warm and dry. She is not diaphoretic.  Psychiatric: She has a normal mood and affect. Her behavior is normal.     Labs on Admission: I have personally reviewed following labs and imaging studies  CBC: Recent Labs  Lab  03/03/18 1504  WBC 7.6  HGB 10.5*  HCT 35.7*  MCV 91.1  PLT 119   Basic Metabolic Panel: Recent Labs  Lab 03/03/18 1504  NA 134*  K 4.7  CL 98  CO2 25  GLUCOSE 139*  BUN 33*  CREATININE 10.19*  CALCIUM 8.6*   GFR: Estimated Creatinine Clearance: 6.4 mL/min (A) (by C-G formula based on SCr of 10.19 mg/dL (H)). Liver Function Tests: No results for input(s): AST, ALT, ALKPHOS, BILITOT, PROT, ALBUMIN in the last 168 hours. No results for input(s): LIPASE, AMYLASE in the last 168 hours. No results for input(s): AMMONIA in the last 168 hours. Coagulation Profile: No results for input(s): INR, PROTIME in the last 168  hours. Cardiac Enzymes: No results for input(s): CKTOTAL, CKMB, CKMBINDEX, TROPONINI in the last 168 hours. BNP (last 3 results) No results for input(s): PROBNP in the last 8760 hours. HbA1C: No results for input(s): HGBA1C in the last 72 hours. CBG: Recent Labs  Lab 03/03/18 1432  GLUCAP 129*   Lipid Profile: No results for input(s): CHOL, HDL, LDLCALC, TRIG, CHOLHDL, LDLDIRECT in the last 72 hours. Thyroid Function Tests: No results for input(s): TSH, T4TOTAL, FREET4, T3FREE, THYROIDAB in the last 72 hours. Anemia Panel: No results for input(s): VITAMINB12, FOLATE, FERRITIN, TIBC, IRON, RETICCTPCT in the last 72 hours. Urine analysis:    Component Value Date/Time   COLORURINE AMBER (A) 12/23/2014 0941   APPEARANCEUR TURBID (A) 12/23/2014 0941   LABSPEC 1.019 12/23/2014 0941   PHURINE 5.0 12/23/2014 0941   GLUCOSEU NEGATIVE 12/23/2014 0941   HGBUR SMALL (A) 12/23/2014 0941   BILIRUBINUR SMALL (A) 12/23/2014 0941   KETONESUR 15 (A) 12/23/2014 0941   PROTEINUR 100 (A) 12/23/2014 0941   UROBILINOGEN 0.2 12/23/2014 0941   NITRITE NEGATIVE 12/23/2014 0941   LEUKOCYTESUR LARGE (A) 12/23/2014 0941    Radiological Exams on Admission: Ct Head Wo Contrast  Result Date: 03/03/2018 CLINICAL DATA:  Dizziness for 2 weeks. Hypertension and diabetes. Stroke  suspected. EXAM: CT HEAD WITHOUT CONTRAST TECHNIQUE: Contiguous axial images were obtained from the base of the skull through the vertex without intravenous contrast. COMPARISON:  12/23/2014 FINDINGS: Brain: Mild low density in the periventricular white matter likely related to small vessel disease. Cerebral and cerebellar atrophy may be age-appropriate. No mass lesion, hemorrhage, hydrocephalus, acute infarct, intra-axial, or extra-axial fluid collection. Vascular: No hyperdense vessel or unexpected calcification.  None. Skull: Normal. Sinuses/Orbits: Normal imaged portions of the orbits and globes. Clear paranasal sinuses and mastoid air cells. Other: None. IMPRESSION: 1.  No acute intracranial abnormality. 2.  Cerebral atrophy and small vessel ischemic change. Electronically Signed   By: Abigail Miyamoto M.D.   On: 03/03/2018 17:54    EKG: Independently reviewed.  Sinus rhythm (heart rate 72), PVCs, intraventricular conduction delay, nonspecific T wave abnormality.  Similar to prior tracing.  QTC 513.  Assessment/Plan Principal Problem:   Vertigo Active Problems:   Hypertension   COPD (chronic obstructive pulmonary disease) ([facility])   Elevated troponin   ESRD (end stage renal disease) on dialysis ([facility])   Type 2 diabetes mellitus with diabetic nephropathy ([facility])   Chronic diastolic CHF (congestive heart failure) ([facility])   GERD (gastroesophageal reflux disease)   CAD (coronary artery disease)   Anemia, chronic disease   Diabetic neuropathy ([place])   Vertigo Patient is presenting with a 2-week history of intermittent dizziness which has been associated with nausea for the past few days.  BPPV is on the differential as dizziness is worse with head movement. Noted to have horizontal nystagmus when looking to the right on exam.  Patient did mention having a 2-hour episode of blurry vision this morning. Afebrile and no leukocytosis.   CT head showing no acute finding. -MRI brain to rule out central vestibular  lesion -Check orthostatics -PT evaluation for possible BPPV; vestibular rehab -Low-dose Phenergan PRN nausea -UA pending to rule out infectious etiology  Troponinemia  I-STAT troponin 0.3 > 0.24.  EKG not suggestive of ACS. Cath done in July 2016 showing mild nonobstructive coronary artery disease.  Patient is chest pain-free and in no acute distress. -Continue to trend troponin -Monitor on telemetry  Anemia of chronic disease Hemoglobin 10.5 with normal MCV, no recent baseline.  Hemoglobin was 12.2 two years ago.  No signs of active bleeding. -Repeat CBC in a.m.  Type 2 diabetes Blood glucose 139 on admission. -Check A1c -Continue home Levemir 55 units every evening -Sliding scale insulin sensitive -CBG checks  CAD -See plan under troponinemia above -Continue home beta-blocker, aspirin  COPD -Stable.  No bronchospasm.  Currently not on any home inhalers. -DuoNebs every 6 hrs as needed  ESRD on HD (TTS) Last dialysis was on Saturday.  -Consult nephrology in the morning as patient is due for dialysis tomorrow morning.  Currently does not appear volume overloaded on exam. -Continue home renal meds, midodrine  Hypertension -Continue home beta-blocker  Chronic diastolic congestive heart failure  -Stable.  Currently not volume overloaded on exam. -Continue home beta-blocker  Diabetic neuropathy -Continue home Neurontin  GERD -Continue PPI  DVT prophylaxis: Subcutaneous heparin Code Status: Patient wishes to be full code. Family Communication: Daughter and granddaughter at bedside. Disposition Plan: Anticipate discharge to home in 1 to 2 days. Consults called: None Admission status: Observation  Shela Leff MD [facility] Pager [phone]  If 7PM-7AM, please contact night-coverage [email or web address] Password [email or web address]  03/03/2018, 8:12 PM

## 2018-03-03 NOTE — Telephone Encounter (Signed)
Just a FYI

## 2018-03-03 NOTE — ED Notes (Signed)
ED MD and PA made aware of Trop. 0.24

## 2018-03-03 NOTE — ED Triage Notes (Signed)
Pt arrived via [facility] from home with c/o dizziness and nausea that started today . Denies pain. Is on dialysis, last went on Saturday and completed 3 hours but did come off early r/t hypotension.

## 2018-03-03 NOTE — Telephone Encounter (Signed)
Sent to PCP as an FYI  

## 2018-03-03 NOTE — ED Provider Notes (Signed)
[facility] EMERGENCY DEPARTMENT Provider Note   CSN: [id number] Arrival date & time: 03/03/18  1314   History   Chief Complaint Chief Complaint  Patient presents with  . Dizziness    HPI Jordan [name] is a 77 y.o. female with past medical history significant for CAD, COPD, ESRD on dialysis (Tuesday Thursday Saturday), diabetes, hypertension, chronic systolic heart failure, sleep apnea had an STEMI before, history NSTEMI resents for evaluation of dizziness and nausea.  Admits to intermittent dizziness with nausea over the last 2 weeks. States she had similar symptoms on Saturday with her dialysis, which was prematurely shortened due to hypotension. Patient states that her recent symptoms started this morning around 7 AM.  States her dizziness is worse with movement. Denies history of vertigo.  Denies fever, chills, headache, blurred vision, slurred speech, facial asymmetry, unilateral weakness, chest pain, shortness of breath. Denies aggravating or alleviating factors. Denies pain.   History obtained from patient and family.  No interpreter was used.  HPI  Past Medical History:  Diagnosis Date  . Arthritis   . Breast cancer ([place])   . CAD (coronary artery disease)    a. minimal CAD by cath in 10/2014.  Marland Kitchen CKD (chronic kidney disease)   . COPD (chronic obstructive pulmonary disease) ([place])   . Diabetes mellitus with ESRD (end-stage renal disease) ([place])   . GERD (gastroesophageal reflux disease)   . Hypertension   . Renal disorder    Family reports acute renal failure  . Sleep apnea    does not wear CPAP  . Systolic CHF, chronic ([place]) 12/23/2014    Patient Active Problem List   Diagnosis Date Noted  . Dizziness 03/03/2018  . Sleep apnea   . Renal disorder   . GERD (gastroesophageal reflux disease)   . Diabetes mellitus with ESRD (end-stage renal disease) ([place])   . CKD (chronic kidney disease)   . CAD (coronary artery disease)   . Paresthesia of  left arm 11/09/2016  . Chronic thoracic back pain 11/09/2016  . Personal history of breast cancer 08/26/2016  . Atypical chest pain 08/03/2016  . Chronic diastolic CHF (congestive heart failure) ([place]) 05/11/2015  . Loose stools 01/13/2015  . Skin irritation 01/13/2015  . Rib pain on left side 01/13/2015  . Enterococcal bacteremia 12/26/2014  . Bacteremia   . UTI (lower urinary tract infection) 12/23/2014  . Acute encephalopathy 12/23/2014  . Systolic CHF, chronic ([place]) 12/23/2014  . Encephalopathy, metabolic 95/18/8416  . Lymphedema of left arm 12/08/2014  . OSA (obstructive sleep apnea) 12/08/2014  . Constipation 12/08/2014  . Nonischemic cardiomyopathy ([place]) 11/10/2014  . Acute on chronic systolic heart failure, NYHA class 3 ([place]) 11/10/2014  . Morbidly obese ([place]) 11/10/2014  . ESRD (end stage renal disease) on dialysis ([place])   . Type 2 diabetes mellitus with diabetic nephropathy ([place])   . Essential hypertension   . Left hip pain   . NSTEMI (non-ST elevated myocardial infarction) ([place]) 10/13/2014  . Pulmonary HTN ([place]) 10/13/2014  . Syncope 10/12/2014  . LBBB (left bundle branch block) 10/12/2014  . Breast cancer ([place])   . Hypertension   . Arthritis   . COPD (chronic obstructive pulmonary disease) ([place])     Past Surgical History:  Procedure Laterality Date  . ARTERIOVENOUS GRAFT PLACEMENT Right 10/05/15  . AV FISTULA PLACEMENT Right 10/26/2014   Procedure: Right Brachiocephalic Arteriovenous FISTULA CREATION;  Surgeon: Conrad Greensburg, MD;  Location: [facility];  Service: Vascular;  Laterality: Right;  .  AV FISTULA PLACEMENT Right 10/05/2015   Procedure: INSERTION OF  ARTERIOVENOUS (AV) GORE-TEX GRAFT RIGHT ARM;  Surgeon: Conrad McGill, MD;  Location: [facility];  Service: Vascular;  Laterality: Right;  . BASCILIC VEIN TRANSPOSITION Right 04/13/2015   Procedure: RIGHT FIRST STAGE BASCILIC VEIN TRANSPOSITION;  Surgeon: Conrad Baraga, MD;  Location: [facility];  Service: Vascular;  Laterality:  Right;  . BASCILIC VEIN TRANSPOSITION Right 07/11/2015   Procedure: SECOND STAGE BASILIC VEIN TRANSPOSITION;  Surgeon: Conrad [place], MD;  Location: [facility];  Service: Vascular;  Laterality: Right;  . BREAST SURGERY Left   . CARDIAC CATHETERIZATION [name] 10/19/2014   Procedure: Right/Left Heart Cath and Coronary Angiography;  Surgeon: Peter M Martinique, MD;  Location: [facility];  Service: Cardiovascular;  Laterality: [name];  . FISTULA SUPERFICIALIZATION Right 01/26/2015   Procedure: Right BRACHIOCEPHALIC ARTERIOVENOUS FISTULA SUPERFICIALIZATION with side branch ligation;  Surgeon: Conrad Ruch, MD;  Location: [facility];  Service: Vascular;  Laterality: Right;  . IR DIALY SHUNT INTRO [employer] W/IMG RIGHT Right 08/23/2016  . IR GENERIC HISTORICAL  04/30/2016   IR REMOVAL TUN CV CATH W/O FL 04/30/2016 Aletta Edouard, MD [facility]-INTER[facility] RAD  . IR US GUIDE VASC ACCESS RIGHT  08/23/2016  . LIGATION OF ARTERIOVENOUS  FISTULA Right 03/07/2015   Procedure: LIGATION OF RIGHT BRACHIOCEPHALIC ARTERIOVENOUS  FISTULA;  Surgeon: Conrad Du Quoin, MD;  Location: [facility];  Service: Vascular;  Laterality: Right;  . LIGATION OF ARTERIOVENOUS  FISTULA Right 10/05/2015   Procedure: LIGATION OF RIGHT BASILIC VEIN TRANSPOSITION; EXCISION OF CICATRIX;  Surgeon: Conrad [place]ville, MD;  Location: [facility];  Service: Vascular;  Laterality: Right;  . PERIPHERAL VASCULAR CATHETERIZATION [name] 01/24/2015   Procedure: Fistulagram;  Surgeon: Conrad Whitney, MD;  Location: [facility];  Service: Cardiovascular;  Laterality: [name];  . PERIPHERAL VASCULAR CATHETERIZATION [name] 08/29/2015   Procedure: Fistulagram;  Surgeon: Conrad Terrebonne, MD;  Location: [facility];  Service: Cardiovascular;  Laterality: [name];  . PERIPHERAL VASCULAR CATHETERIZATION Right 08/29/2015   Procedure: Peripheral Vascular Balloon Angioplasty;  Surgeon: Conrad Pewamo, MD;  Location: [facility];  Service: Cardiovascular;  Laterality: Right;  arm fistula  . TEE  WITHOUT CARDIOVERSION [name] 12/27/2014   Procedure: TRANSESOPHAGEAL ECHOCARDIOGRAM (TEE);  Surgeon: Thayer Headings, MD;  Location: [facility];  Service: Cardiovascular;  Laterality: [name];  . THROMBECTOMY W/ EMBOLECTOMY Right 03/07/2015   Procedure: THROMBECTOMY OF RIGHT BRACHIOCEPHALIC ARTERIOVENOUS FISTULA;  Surgeon: Conrad , MD;  Location: [facility];  Service: Vascular;  Laterality: Right;  . THROMBECTOMY W/ EMBOLECTOMY Right 01/27/2016   Procedure: THROMBECTOMY AND REVISION OF RIGHT UPPER ARM ARTERIOVENOUS GORE-TEX GRAFT;  Surgeon: Angelia Mould, MD;  Location: [facility];  Service: Vascular;  Laterality: Right;     OB History   None      Home Medications    Prior to Admission medications   Medication Sig Start Date End Date Taking? Authorizing Provider  aspirin EC 81 MG tablet Take 81 mg by mouth See admin instructions. Take one tablet (81 mg) by mouth every Monday, Wednesday, Friday and Sunday morning (non-dialysis days)   Yes [provider]  Calcium Carbonate Antacid (TUMS ULTRA 1000 PO) Take 1,000 mg by mouth 3 (three) times daily with meals.   Yes [provider]  carvedilol (COREG) 6.25 MG tablet Take 1 tablet (6.25 mg total) by mouth See admin instructions. Take 6.25 mg by mouth twice daily on Monday, Wednesday, Friday  and Sunday. Patient taking differently: Take 6.25 mg by mouth See admin instructions. Take one tablet ( 6.25 mg) by mouth twice daily on Monday, Wednesday, Friday and Sunday. (non-dialysis days) 11/09/16  Yes Leone Haven, MD  clotrimazole (LOTRIMIN) 1 % cream Apply 1 application topically 2 (two) times daily as needed (rash under breasts).  12/04/17  Yes [provider]  gabapentin (NEURONTIN) 300 MG capsule Take 300 mg by mouth See admin instructions. Take one capsule (300 mg) by mouth every Monday, Wednesday, Friday and Sunday morning (non-dialysis days)   Yes [provider]  LEVEMIR FLEXTOUCH 100 UNIT/ML Pen INJECT 55  UNITS INTO THE SKIN AT BEDTIME. Patient taking differently: Inject 55 Units into the skin every evening.  07/11/17  Yes Leone Haven, MD  midodrine (PROAMATINE) 5 MG tablet Take 5 mg by mouth See admin instructions. Take one tablet (5 mg) by mouth during dialysis (Tuesday, Thursday, Saturday) as needed for low blood pressure <79/30 06/21/17  Yes [provider]  Multiple Vitamin (MULTIVITAMIN WITH MINERALS) TABS tablet Take 1 tablet by mouth See admin instructions. Take one tablet by mouth every Monday, Wednesday, Friday and Sunday morning (non-dialysis days) - [facility]   Yes [provider]  omeprazole (PRILOSEC) 20 MG capsule Take 20 mg by mouth See admin instructions. Take one capsule (20 mg) by mouth on Monday, Wednesday, Friday and Sunday morning (non-dialysis days)   Yes [provider]  glucose blood (ONETOUCH VERIO) test strip Test once daily 09/17/17   Leone Haven, MD  Insulin Pen Needle (BD PEN NEEDLE NANO U/F) 32G X 4 MM MISC Use as directed 09/17/17   Leone Haven, MD  Lancets [facility] ULTRASOFT) lancets Use to check blood sugars 2-3 times daily as directed. 09/19/17   Leone Haven, MD    Family History Family History  Problem Relation Age of Onset  . Diabetes Mother   . Hypertension Mother   . Stroke Sister   . Hypertension Sister   . Heart attack Neg Hx     Social History Social History   Tobacco Use  . Smoking status: Former Research scientist (life sciences)  . Smokeless tobacco: Never Used  Substance Use Topics  . Alcohol use: No    Alcohol/week: 0.0 standard drinks  . Drug use: No     Allergies   Vicodin [hydrocodone-acetaminophen] and Adhesive [tape]   Review of Systems Review of Systems  Constitutional: Negative.   HENT: Negative.   Eyes: Negative.   Respiratory: Negative.   Cardiovascular: Negative.   Gastrointestinal: Positive for nausea. Negative for abdominal distention, abdominal pain, anal bleeding, blood in stool,  constipation, diarrhea, rectal pain and vomiting.  Genitourinary: Negative.   Musculoskeletal: Negative.   Neurological: Positive for dizziness.  All other systems reviewed and are negative.    Physical Exam Updated Vital Signs BP (!) 147/61   Pulse 77   Temp 97.6 F (36.4 C) (Oral)   Resp 14   Ht 5\' 5"  (1.651 m)   Wt 124.3 kg   SpO2 91%   BMI 45.60 kg/m   Physical Exam  Physical Exam  Constitutional: Pt is oriented to person, place, and time. Pt appears well-developed and well-nourished. No distress.  HENT:  Head: Normocephalic and atraumatic.  Mouth/Throat: Oropharynx is clear and moist.  Eyes: Conjunctivae and EOM are normal. Pupils are equal, round, and reactive to light. No scleral icterus.  Right beat nystagmus. Rotary nystagmus to left with cover of right eye. Neck: Normal range of  motion. Neck supple.  Full active and passive ROM without pain No midline or paraspinal tenderness No nuchal rigidity or meningeal signs  Cardiovascular: Normal rate, regular rhythm and intact distal pulses.   Pulmonary/Chest: Effort normal and breath sounds normal. No respiratory distress. Pt has no wheezes. No rales.  Abdominal: Soft. Bowel sounds are normal. There is no tenderness. There is no rebound and no guarding.  Musculoskeletal: Normal range of motion.  Lymphadenopathy:    No cervical adenopathy.  Neurological: Pt. is alert and oriented to person, place, and time. He has normal reflexes. No cranial nerve deficit.  Exhibits normal muscle tone. Coordination normal.  Mental Status:  Alert, oriented, thought content appropriate. Speech fluent without evidence of aphasia. Able to follow 2 step commands without difficulty.  Cranial Nerves:  II:  Peripheral visual fields grossly normal, pupils equal, round, reactive to light III,IV, VI: ptosis not present, extra-ocular motions intact bilaterally  V,VII: smile symmetric, facial light touch sensation equal VIII: hearing grossly normal  bilaterally  IX,X: midline uvula rise  XI: bilateral shoulder shrug equal and strong XII: midline tongue extension  Motor:  5/5 in upper and lower extremities bilaterally including strong and equal grip strength and dorsiflexion/plantar flexion Sensory: Pinprick and light touch normal in all extremities.  Deep Tendon Reflexes: 2+ and symmetric  Cerebellar: normal finger-to-nose with bilateral upper extremities Gait: normal gait and balance CV: distal pulses palpable throughout   Skin: Skin is warm and dry. No rash noted. Pt is not diaphoretic.  Dialysis access to right upper arm.  Bilateral 2+ pitting edema to bilateral lower extremity.  Edema to left upper extremity, history of lymphedema. Psychiatric: Pt has a normal mood and affect. Behavior is normal. Judgment and thought content normal.  Nursing note and vitals reviewed.  ED Treatments / Results  Labs (all labs ordered are listed, but only abnormal results are displayed) Labs Reviewed  BASIC METABOLIC PANEL - Abnormal; Notable for the following components:      Result Value   Sodium 134 (*)    Glucose, Bld 139 (*)    BUN 33 (*)    Creatinine, Ser 10.19 (*)    Calcium 8.6 (*)    GFR calc non Af Amer 3 (*)    GFR calc Af Amer 4 (*)    All other components within normal limits  CBC - Abnormal; Notable for the following components:   Hemoglobin 10.5 (*)    HCT 35.7 (*)    MCHC 29.4 (*)    All other components within normal limits  CBG MONITORING, ED - Abnormal; Notable for the following components:   Glucose-Capillary 129 (*)    All other components within normal limits  I-STAT TROPONIN, ED - Abnormal; Notable for the following components:   Troponin i, poc 0.30 (*)    All other components within normal limits  I-STAT TROPONIN, ED - Abnormal; Notable for the following components:   Troponin i, poc 0.24 (*)    All other components within normal limits  URINALYSIS, ROUTINE W REFLEX MICROSCOPIC  CBG MONITORING, ED     EKG EKG Interpretation  Date/Time:  Monday March 03 2018 13:17:17 EST Ventricular Rate:  72 PR Interval:    QRS Duration: 167 QT Interval:  468 QTC Calculation: 513 R Axis:   -118 Text Interpretation:  Sinus rhythm Multiple premature complexes, vent & supraven Prolonged PR interval Nonspecific IVCD with LAD Probable anterolateral infarct, old Baseline wander in lead(s) I II aVR aVL aVF V1 V2 V3  V4 V5 V6 similar pattern to prior 9/16 Confirmed by Aletta Edouard [phone]) on 03/03/2018 1:48:53 PM   Radiology Ct Head Wo Contrast  Result Date: 03/03/2018 CLINICAL DATA:  Dizziness for 2 weeks. Hypertension and diabetes. Stroke suspected. EXAM: CT HEAD WITHOUT CONTRAST TECHNIQUE: Contiguous axial images were obtained from the base of the skull through the vertex without intravenous contrast. COMPARISON:  12/23/2014 FINDINGS: Brain: Mild low density in the periventricular white matter likely related to small vessel disease. Cerebral and cerebellar atrophy may be age-appropriate. No mass lesion, hemorrhage, hydrocephalus, acute infarct, intra-axial, or extra-axial fluid collection. Vascular: No hyperdense vessel or unexpected calcification.  None. Skull: Normal. Sinuses/Orbits: Normal imaged portions of the orbits and globes. Clear paranasal sinuses and mastoid air cells. Other: None. IMPRESSION: 1.  No acute intracranial abnormality. 2.  Cerebral atrophy and small vessel ischemic change. Electronically Signed   By: Abigail Miyamoto M.D.   On: 03/03/2018 17:54    Procedures Procedures (including critical care time)  Medications Ordered in ED Medications - No data to display   Initial Impression / Assessment and Plan / ED Course  I have reviewed the triage vital signs and the nursing notes.  Pertinent labs & imaging results that were available during my care of the patient were reviewed by me and considered in my medical decision making (see chart for details).  77 year old female who  appears chronically ill presents for evaluation of dizziness and nausea.  Onset at 7 AM this morning.  Afebrile, nonseptic, non-ill-appearing.  Neurologic exam with right beat nystagmus as well as left rotary nystagmus with cover-uncover.  No dysmetria with finger-to-nose, negative Romberg or pronator drift.  No unilateral weakness or facial asymmetry.  Will obtain labs, EKG, head CT and reevaluate.  Clinical Course as of Mar 03 1921  Mon Mar 03, 2018  1829 Mild hyponatremia at 134, Elevated glucose at 139, hx diabetes, Elevated BUN and Creatinine, on dialysis (T,R,Sat)  Basic metabolic panel(!) [BH]  2297 Negative for bleed, mass or acute CVA  CT Head Wo Contrast [BH]  1830 Elevated troponin, however decreased from previous troponin at 0.3  I-Stat Troponin, ED (not at [facility])(!!) [BH]    Clinical Course User Index [BH] Teven Mittman A, PA-C   CBC without leukocytosis. Hemoglobin 10.5 which is at patient's baseline according to previous records.  Metabolic panel with elevated creatinine and BUN, this is at patient's baseline as she has end-stage renal failure on hemodialysis.  Glucose mildly elevated, history of diabetes.  I-STAT troponin 0.3, repeat 0.24. EKG without ST changes or ischemic changes.  Patient is chest pain-free and in no acute distress.  View of past medical records shows she did have a catheterization performed in July 2016 which showed mild nonobstructive CAD.  CT without evidence of acute pathology.  Patient will most likely need MRI to r/o central causes of nystagmus. Will need admission to trend troponin and obtain MRI. Will consult with hospitalist for admission.  Consulted with Dr. Marlowe Sax, [facility] hospitalists whop agrees for admission or dizziness with abnormal nystagmus and trending of elevated troponin.  Patient was seen and evaluated by my attending, Dr. Eulis Foster.  He agrees with above treatment, plan disposition of patient.   Final Clinical Impressions(s) / ED Diagnoses    Final diagnoses:  None    ED Discharge Orders    None       Maygen Sirico A, PA-C 03/03/18 2238    Daleen Bo, MD 03/04/18 [phone]

## 2018-03-03 NOTE — Progress Notes (Signed)
Assessed L arm for PIV: Unable to find vein with ultrasound. Pt has lymphedema in LUE. Reports she had 8 lymph nodes removed 5 years ago. Spoke with ED Charge RN: Will need nephrology order to stick R hand. Pt has fistula in RUE.

## 2018-03-04 ENCOUNTER — Observation Stay (HOSPITAL_COMMUNITY): Payer: Medicare Other

## 2018-03-04 DIAGNOSIS — I5032 Chronic diastolic (congestive) heart failure: Secondary | ICD-10-CM | POA: Diagnosis not present

## 2018-03-04 DIAGNOSIS — I132 Hypertensive heart and chronic kidney disease with heart failure and with stage 5 chronic kidney disease, or end stage renal disease: Secondary | ICD-10-CM | POA: Diagnosis not present

## 2018-03-04 DIAGNOSIS — N186 End stage renal disease: Secondary | ICD-10-CM | POA: Diagnosis not present

## 2018-03-04 DIAGNOSIS — R42 Dizziness and giddiness: Secondary | ICD-10-CM | POA: Diagnosis not present

## 2018-03-04 LAB — CBC
HCT: 29.8 % — ABNORMAL LOW (ref 36.0–46.0)
Hemoglobin: 8.9 g/dL — ABNORMAL LOW (ref 12.0–15.0)
MCH: 26.7 pg (ref 26.0–34.0)
MCHC: 29.9 g/dL — AB (ref 30.0–36.0)
MCV: 89.5 fL (ref 80.0–100.0)
PLATELETS: 182 10*3/uL (ref 150–400)
RBC: 3.33 MIL/uL — AB (ref 3.87–5.11)
RDW: 15.2 % (ref 11.5–15.5)
WBC: 6.8 10*3/uL (ref 4.0–10.5)
nRBC: 0 % (ref 0.0–0.2)

## 2018-03-04 LAB — TROPONIN I
TROPONIN I: 0.18 ng/mL — AB (ref <0.03)
TROPONIN I: 0.48 ng/mL — AB (ref <0.03)

## 2018-03-04 LAB — RENAL FUNCTION PANEL
Albumin: 3.2 g/dL — ABNORMAL LOW (ref 3.5–5.0)
Anion gap: 12 (ref 5–15)
BUN: 43 mg/dL — AB (ref 8–23)
CHLORIDE: 99 mmol/L (ref 98–111)
CO2: 26 mmol/L (ref 22–32)
CREATININE: 12.03 mg/dL — AB (ref 0.44–1.00)
Calcium: 8.5 mg/dL — ABNORMAL LOW (ref 8.9–10.3)
GFR calc Af Amer: 3 mL/min — ABNORMAL LOW (ref >60)
GFR calc non Af Amer: 3 mL/min — ABNORMAL LOW (ref >60)
GLUCOSE: 143 mg/dL — AB (ref 70–99)
POTASSIUM: 4.4 mmol/L (ref 3.5–5.1)
Phosphorus: 5.1 mg/dL — ABNORMAL HIGH (ref 2.5–4.6)
Sodium: 137 mmol/L (ref 135–145)

## 2018-03-04 LAB — HEMOGLOBIN A1C
HEMOGLOBIN A1C: 6.4 % — AB (ref 4.8–5.6)
Mean Plasma Glucose: 136.98 mg/dL

## 2018-03-04 LAB — GLUCOSE, CAPILLARY
GLUCOSE-CAPILLARY: 97 mg/dL (ref 70–99)
GLUCOSE-CAPILLARY: 110 mg/dL — AB (ref 70–99)
Glucose-Capillary: 129 mg/dL — ABNORMAL HIGH (ref 70–99)
Glucose-Capillary: 172 mg/dL — ABNORMAL HIGH (ref 70–99)

## 2018-03-04 LAB — HEPATITIS B SURFACE ANTIGEN: HEP B S AG: NEGATIVE

## 2018-03-04 MED ORDER — CALCITRIOL 0.5 MCG PO CAPS
ORAL_CAPSULE | ORAL | Status: AC
  Filled 2018-03-04: qty 1

## 2018-03-04 MED ORDER — DIAZEPAM 2 MG PO TABS
2.0000 mg | ORAL_TABLET | Freq: Two times a day (BID) | ORAL | Status: DC
  Administered 2018-03-04 – 2018-03-05 (×2): 2 mg via ORAL
  Filled 2018-03-04 (×2): qty 1

## 2018-03-04 MED ORDER — MECLIZINE HCL 25 MG PO TABS
25.0000 mg | ORAL_TABLET | Freq: Three times a day (TID) | ORAL | Status: DC
  Administered 2018-03-04 – 2018-03-05 (×2): 25 mg via ORAL
  Filled 2018-03-04 (×3): qty 1

## 2018-03-04 MED ORDER — CALCITRIOL 0.25 MCG PO CAPS
ORAL_CAPSULE | ORAL | Status: AC
  Filled 2018-03-04: qty 1

## 2018-03-04 MED ORDER — HEPARIN SODIUM (PORCINE) 1000 UNIT/ML IJ SOLN
INTRAMUSCULAR | Status: AC
  Administered 2018-03-04: 2000 [IU]
  Filled 2018-03-04: qty 2

## 2018-03-04 MED ORDER — ACETAMINOPHEN 325 MG PO TABS
650.0000 mg | ORAL_TABLET | Freq: Four times a day (QID) | ORAL | Status: DC | PRN
  Administered 2018-03-04: 650 mg via ORAL
  Filled 2018-03-04: qty 2

## 2018-03-04 MED ORDER — DARBEPOETIN ALFA 40 MCG/0.4ML IJ SOSY
PREFILLED_SYRINGE | INTRAMUSCULAR | Status: AC
  Administered 2018-03-04: 40 ug via INTRAVENOUS
  Filled 2018-03-04: qty 0.4

## 2018-03-04 MED ORDER — ATORVASTATIN CALCIUM 40 MG PO TABS
40.0000 mg | ORAL_TABLET | Freq: Every day | ORAL | Status: DC
  Administered 2018-03-04: 40 mg via ORAL
  Filled 2018-03-04: qty 1

## 2018-03-04 MED ORDER — DARBEPOETIN ALFA 40 MCG/0.4ML IJ SOSY
40.0000 ug | PREFILLED_SYRINGE | INTRAMUSCULAR | Status: DC
  Administered 2018-03-04: 40 ug via INTRAVENOUS
  Filled 2018-03-04: qty 0.4

## 2018-03-04 MED ORDER — CALCITRIOL 0.25 MCG PO CAPS
0.7500 ug | ORAL_CAPSULE | ORAL | Status: DC
  Administered 2018-03-04: 0.75 ug via ORAL

## 2018-03-04 NOTE — Evaluation (Addendum)
Physical Therapy Evaluation Patient Details Name: Jordan [name] MRN: [id number] DOB: 03-13-1941 Today's Date: 03/04/2018   History of Present Illness  Jordan [name] is a 77 y.o. female with medical history significant of CAD, COPD, ESRD on HD (TTS), type 2 diabetes, hypertension, chronic diastolic congestive heart failure presenting to the hospital for evaluation of dizziness and nausea.  Symptoms started 2 weeks ago.  Patient has been feeling intermittently dizzy, almost on a daily basis.  States 3 days ago her dizziness was associated with nausea at dialysis and she was told her blood pressure was too low and dialysis had to be stopped prematurely.  She again felt dizzy yesterday and this morning her vision was blurry when watching television.  Blurriness of vision lasted about 2 hours.  Clinical Impression  Pt admitted with above diagnosis. Pt currently with functional limitations due to the deficits listed below (see PT Problem List). 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  Initiated x1 exercises as well as pt with issues with gaze stability.  Recommend HHPT for vestibular rehab and let CM know.  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  Will follow acutely.   Pt will benefit from skilled PT to increase their independence and safety with mobility to allow discharge to the venue listed below.      Follow Up Recommendations Home health PT for Vestibular rehab;Supervision/Assistance - 24 hour, pt may want to follow up with ENT as well for further vestibular testing.      Equipment Recommendations  None recommended by PT    Recommendations for Other Services       Precautions / Restrictions Precautions Precautions: Fall Restrictions Weight Bearing Restrictions: No      Mobility  Bed Mobility Overal bed mobility: Needs Assistance Bed Mobility:  Supine to Sit     Supine to sit: Min guard     General bed mobility comments: took incr time but was able to sit up on her own.    Transfers Overall transfer level: Needs assistance Equipment used: 1 person hand held assist Transfers: Sit to/from [facility] Sit to Stand: Min guard Stand pivot transfers: Min guard       General transfer comment: Pt slightly unsteady, questionably due to not being up as much.   Ambulation/Gait                Stairs            Wheelchair Mobility    Modified Rankin (Stroke Patients Only)       Balance Overall balance assessment: Needs assistance Sitting-balance support: No upper extremity supported;Feet supported Sitting balance-Leahy Scale: Fair     Standing balance support: Single extremity supported;During functional activity Standing balance-Leahy Scale: Poor Standing balance comment: relies on bil UE support one on furniture and one on PT to pivot.  Uses RW at home.                              Pertinent Vitals/Pain Pain Assessment: No/denies pain    Home Living Family/patient expects to be discharged to:: Private residence Living Arrangements: Alone Available Help at Discharge: Available PRN/intermittently(from [place], only here staying with daughter) Type of Home: House Home Access: Level entry       Home Equipment: Walker - 2 wheels;Walker -  4 wheels;Grab bars - tub/shower;Tub bench;Hand held shower head;Bedside commode Additional Comments: Pt staying with daughter here visiting.  Daughter has 24 steps into her apartment.  Pt has her 4 wheeled RW and tub bench here.  Other equipment is at her home in [place].  Pt has a level entry at her home and is returning first week of December.     Prior Function Level of Independence: Needs assistance   Gait / Transfers Assistance Needed: used RW at all times per pt  ADL's / Homemaking Assistance Needed: Pt independent as she normally lives  alone        Hand Dominance   Dominant Hand: Right    Extremity/Trunk Assessment   Upper Extremity Assessment Upper Extremity Assessment: Defer to OT evaluation    Lower Extremity Assessment Lower Extremity Assessment: Generalized weakness    Cervical / Trunk Assessment Cervical / Trunk Assessment: Normal  Communication   Communication: HOH  Cognition Arousal/Alertness: Awake/alert Behavior During Therapy: WFL for tasks assessed/performed Overall Cognitive Status: Within Functional Limits for tasks assessed                                        General Comments General comments (skin integrity, edema, etc.): 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  Had positive head thrust left.  Pt also suspicious for left BPPV therefore treated pt for left posterior canal BPPV. Also initiated x1 exercises and pt can return demonstration but had a lot of difficulty keeping her eyes on target due to poor gaze stability.      Exercises General Exercises - Lower Extremity Ankle Circles/Pumps: AROM;Both;10 reps;Supine Long Arc Quad: AROM;Both;10 reps;Seated Other Exercises Other Exercises: x1 exercises    Assessment/Plan    PT Assessment Patient needs continued PT services  PT Problem List Decreased activity tolerance;Decreased balance;Decreased mobility;Decreased knowledge of use of DME;Decreased safety awareness;Decreased knowledge of precautions;Obesity       PT Treatment Interventions DME instruction;Gait training;Stair training;Functional mobility training;Therapeutic activities;Therapeutic exercise;Balance training;Patient/family education(gaze stability)    PT Goals (Current goals can be found in the Care Plan section)  Acute Rehab PT Goals Patient Stated Goal: to go home first week of December to [place] PT Goal Formulation: With patient Time For Goal Achievement: 03/18/18 Potential to Achieve Goals: Good    Frequency Min 3X/week   Barriers to discharge         Co-evaluation               AM-PAC PT "6 Clicks" Daily Activity  Outcome Measure Difficulty turning over in bed (including adjusting bedclothes, sheets and blankets)?: None Difficulty moving from lying on back to sitting on the side of the bed? : A Little Difficulty sitting down on and standing up from a chair with arms (e.g., wheelchair, bedside commode, etc,.)?: A Little Help needed moving to and from a bed to chair (including a wheelchair)?: A Little Help needed walking in hospital room?: A Lot Help needed climbing 3-5 steps with a railing? : A Lot 6 Click Score: 17    End of Session Equipment Utilized During Treatment: Gait belt Activity Tolerance: Patient limited by fatigue Patient left: in chair;with call bell/phone within reach Nurse Communication: Mobility status PT Visit Diagnosis: Unsteadiness on feet (R26.81);Muscle weakness (generalized) (M62.81);Dizziness and giddiness (R42);BPPV BPPV - Right/Left : Left    Time: 1962-2297 PT Time Calculation (min) (ACUTE ONLY): 27 min   Charges:  PT Evaluation $PT Eval Moderate Complexity: 1 Mod PT Treatments $Canalith Rep Proc: 8-22 mins        Kolbey Teichert,PT Acute Rehabilitation Services Pager:  [phone]  Office:  [phone]    Denice Paradise 03/04/2018, 11:43 AM

## 2018-03-04 NOTE — Progress Notes (Signed)
PROGRESS NOTE                                                                                                                                                                                                             Patient Demographics:    Jordan [name], is a 77 y.o. female, DOB - Sep 08, 1940, MLY:[id number]  Admit date - 03/03/2018   Admitting Physician Shela Leff, MD  Outpatient Primary MD for the patient is Caryl Bis, Angela Adam, MD  LOS - 0  Chief Complaint  Patient presents with  . Dizziness       Brief Narrative  Jordan [name] is a 77 y.o. female with medical history significant of CAD, COPD, ESRD on HD (TTS), type 2 diabetes, hypertension, chronic diastolic congestive heart failure presenting to the hospital for evaluation of dizziness and nausea.  Symptoms started 2 weeks ago.  Patient has been feeling intermittently dizzy, almost on a daily basis.  States 3 days ago her dizziness was associated with nausea at dialysis and she was told her blood pressure was too low and dialysis had to be stopped prematurely.     Subjective:    Jalaiya [name] today has, No headache, No chest pain, No abdominal pain - No Nausea, No new weakness tingling or numbness, No Cough - SOB.     Assessment  & Plan :     1.  Vertigo.  Suspicious for BPPV.  CT head MRI brain unremarkable, no focal signs, does have a positive nystagmus on right lateral gaze, placed on meclizine and Valium, PT and OT for vestibular rehab.  2.  ESRD.  Routine dialysis in [place] on Tuesday, Thursday and Saturday schedule.  Renal consulted.  3.  Mildly elevated troponin.  Flat trend and non-ACS pattern, no chest pain, EKG shows left bundle branch block which is, records reviewed from [place].  Continue aspirin and Coreg, add statin for secondary prevention.  Check echocardiogram to review EF and wall portion.  Note echo reviewed from [place] with a EF of 45%, LVH and global hypokinesis.  She has  no chest symptoms.  4.  Essential hypertension.  Currently on Coreg continue and monitor.  5.  Obesity.  Follow with PCP in [place] post discharge.  6.  Chronic disease.  Stable no acute issues.  7. COPD.  No acute issues.  Supportive care.    8.  GERD.  Continue PPI.    9. DM type II peripheral neuropathy.  On Levemir and sliding scale.  Continue Neurontin for neuropathy.  CBG (last 3)  Recent Labs    03/03/18 2131 03/04/18 0804 03/04/18 1209  GLUCAP 154* 97 110*     Family Communication  :  None  Code Status :  Full  Disposition Plan  :  Home in 1-2 days  Consults  :  Renal  Procedures  :    CT-MRI brain.  Both nonacute.  Echocardiogram done here is pending.  Echocardiogram reviewed from [place] completed in 12/03/2017.  Showing a EF of 45%, LVH and mild global hypokinesis.    DVT Prophylaxis  :   Heparin   Lab Results  Component Value Date   PLT 182 03/04/2018    Diet :  Diet Order            Diet renal/carb modified with fluid restriction Diet-HS Snack? Nothing; Fluid restriction: 1200 mL Fluid; Room service appropriate? Yes; Fluid consistency: Thin  Diet effective now               Inpatient Medications Scheduled Meds: . [START ON 03/05/2018] aspirin EC  81 mg Oral Once per day on Sun Mon Wed Fri  . calcitRIOL  0.75 mcg Oral Q T,Th,Sa-HD  . calcium carbonate  1,000 mg Oral TID WC  . [START ON 03/05/2018] carvedilol  6.25 mg Oral 2 times per day on Sun Mon Wed Fri   And  . carvedilol  6.25 mg Oral Once per day on Tue Thu Sat  . darbepoetin (ARANESP) injection - DIALYSIS  40 mcg Intravenous Q Tue-HD  . [START ON 03/05/2018] gabapentin  300 mg Oral Once per day on Sun Mon Wed Fri  . heparin  5,000 Units Subcutaneous Q8H  . insulin aspart  0-9 Units Subcutaneous TID WC  . insulin detemir  55 Units Subcutaneous QHS  . [START ON 03/05/2018] midodrine  5 mg Oral Q M,W,F-HD  . [START ON 03/05/2018] multivitamin with minerals  1 tablet Oral Once  per day on Sun Mon Wed Fri  . [START ON 03/05/2018] pantoprazole  20 mg Oral Once per day on Sun Mon Wed Fri   Continuous Infusions: PRN Meds:.acetaminophen, clotrimazole, ipratropium-albuterol, promethazine  Antibiotics  :   Anti-infectives (From admission, onward)   None          Objective:   Vitals:   03/03/18 2111 03/03/18 2115 03/03/18 2130 03/04/18 0617  BP: (!) 196/77 (!) 165/94  (!) 128/49  Pulse: 81 86  64  Resp: 19   17  Temp: 98.8 F (37.1 C) 98.8 F (37.1 C)  98.6 F (37 C)  TempSrc: Oral Oral  Oral  SpO2: 95% 99%  96%  Weight:      Height:   5\' 5"  (1.651 m)     Wt Readings from Last 3 Encounters:  03/03/18 124.3 kg  09/17/17 129 kg  03/15/17 129.6 kg     Intake/Output Summary (Last 24 hours) at 03/04/2018 1330 Last data filed at 03/04/2018 0900 Gross per 24 hour  Intake 744 ml  Output -  Net 744 ml     Physical Exam  Awake Alert, Oriented X 3, No new F.N deficits, Normal affect [place].AT,PERRAL, +ve Nystagmus on right lateral gaze Supple Neck,No JVD, No cervical lymphadenopathy appriciated.  Symmetrical Chest wall movement, Good air movement bilaterally, CTAB RRR,No Gallops,Rubs or new Murmurs, No Parasternal Heave +ve B.Sounds, Abd Soft, No tenderness, No organomegaly appriciated, No rebound - guarding or rigidity. No Cyanosis, Clubbing or edema, No new Rash or bruise  Data Review:    CBC Recent Labs  Lab 03/03/18 1504 03/04/18 0708  WBC 7.6 6.8  HGB 10.5* 8.9*  HCT 35.7* 29.8*  PLT 181 182  MCV 91.1 89.5  MCH 26.8 26.7  MCHC 29.4* 29.9*  RDW 15.2 15.2    Chemistries  Recent Labs  Lab 03/03/18 1504  NA 134*  K 4.7  CL 98  CO2 25  GLUCOSE 139*  BUN 33*  CREATININE 10.19*  CALCIUM 8.6*   ------------------------------------------------------------------------------------------------------------------ No results for input(s): CHOL, HDL, LDLCALC, TRIG, CHOLHDL, LDLDIRECT in the last 72 hours.  Lab Results    Component Value Date   HGBA1C 6.4 (H) 03/04/2018   ------------------------------------------------------------------------------------------------------------------ No results for input(s): TSH, T4TOTAL, T3FREE, THYROIDAB in the last 72 hours.  Invalid input(s): FREET3 ------------------------------------------------------------------------------------------------------------------ No results for input(s): VITAMINB12, FOLATE, FERRITIN, TIBC, IRON, RETICCTPCT in the last 72 hours.  Coagulation profile No results for input(s): INR, PROTIME in the last 168 hours.  No results for input(s): DDIMER in the last 72 hours.  Cardiac Enzymes Recent Labs  Lab 03/03/18 1958 03/04/18 0210 03/04/18 0708  TROPONINI 0.22* 0.18* 0.48*   ------------------------------------------------------------------------------------------------------------------    Component Value Date/Time   BNP 143.8 (H) 10/12/2014 0100    Micro Results No results found for this or any previous visit (from the past 240 hour(s)).  Radiology Reports Ct Head Wo Contrast  Result Date: 03/03/2018 CLINICAL DATA:  Dizziness for 2 weeks. Hypertension and diabetes. Stroke suspected. EXAM: CT HEAD WITHOUT CONTRAST TECHNIQUE: Contiguous axial images were obtained from the base of the skull through the vertex without intravenous contrast. COMPARISON:  12/23/2014 FINDINGS: Brain: Mild low density in the periventricular white matter likely related to small vessel disease. Cerebral and cerebellar atrophy may be age-appropriate. No mass lesion, hemorrhage, hydrocephalus, acute infarct, intra-axial, or extra-axial fluid collection. Vascular: No hyperdense vessel or unexpected calcification.  None. Skull: Normal. Sinuses/Orbits: Normal imaged portions of the orbits and globes. Clear paranasal sinuses and mastoid air cells. Other: None. IMPRESSION: 1.  No acute intracranial abnormality. 2.  Cerebral atrophy and small vessel ischemic change.  Electronically Signed   By: Abigail Miyamoto M.D.   On: 03/03/2018 17:54   Mr Brain Wo Contrast  Result Date: 03/03/2018 CLINICAL DATA:  Episodic vertigo EXAM: MRI HEAD WITHOUT CONTRAST TECHNIQUE: Multiplanar, multiecho pulse sequences of the brain and surrounding structures were obtained without intravenous contrast. COMPARISON:  Head CT 03/03/2018 FINDINGS: BRAIN: There is no acute infarct, acute hemorrhage, hydrocephalus or extra-axial collection. Partially empty sella. There are no old infarcts. Mild multifocal white matter hyperintensity. The cerebral and cerebellar volume are age-appropriate. Susceptibility-sensitive sequences show no chronic microhemorrhage or superficial siderosis. VASCULAR: Major intracranial arterial and venous sinus flow voids are normal. SKULL AND UPPER CERVICAL SPINE: Calvarial bone marrow signal is normal. There is no skull base mass. Visualized upper cervical spine and soft tissues are normal. SINUSES/ORBITS: Bilateral mastoid effusions. Paranasal sinuses are clear. The orbits are normal. IMPRESSION: 1. Mild chronic ischemic change of the white matter without acute intracranial abnormality. 2. Bilateral small mastoid effusions. Electronically Signed   By: Ulyses Jarred M.D.   On: 03/03/2018 22:44    Time Spent in minutes  30   Lala Lund M.D on 03/04/2018 at 1:30 PM  To page go to [email or web address] - password [email or web address]

## 2018-03-04 NOTE — Plan of Care (Signed)
  Problem: Education: Goal: Ability to describe self-care measures that may prevent or decrease complications (Diabetes Survival Skills Education) will improve Outcome: Progressing Goal: Individualized Educational Video(s) Outcome: Progressing   Problem: Education: Goal: Knowledge of General Education information will improve Description Including pain rating scale, medication(s)/side effects and non-pharmacologic comfort measures Outcome: Progressing   Problem: Health Behavior/Discharge Planning: Goal: Ability to manage health-related needs will improve Outcome: Progressing   Problem: Clinical Measurements: Goal: Ability to maintain clinical measurements within normal limits will improve Outcome: Progressing Goal: Will remain free from infection Outcome: Progressing Goal: Diagnostic test results will improve Outcome: Progressing Goal: Respiratory complications will improve Outcome: Progressing Goal: Cardiovascular complication will be avoided Outcome: Progressing   Problem: Activity: Goal: Risk for activity intolerance will decrease Outcome: Progressing   Problem: Nutrition: Goal: Adequate nutrition will be maintained Outcome: Progressing   Problem: Coping: Goal: Level of anxiety will decrease Outcome: Progressing   Problem: Elimination: Goal: Will not experience complications related to bowel motility Outcome: Progressing Goal: Will not experience complications related to urinary retention Outcome: Progressing   Problem: Pain Managment: Goal: General experience of comfort will improve Outcome: Progressing   Problem: Safety: Goal: Ability to remain free from injury will improve Outcome: Progressing   Problem: Skin Integrity: Goal: Risk for impaired skin integrity will decrease Outcome: Progressing

## 2018-03-04 NOTE — Consult Note (Signed)
[facility] Renal Consultation Note  Indication for Consultation:  Management of ESRD/hemodialysis; anemia, hypertension/volume and secondary hyperparathyroidism  HPI: Jordan [name] is a 77 y.o. 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 10/2014 cath ) ,Chronic Diastolic  HF , COPD, OSA (does not use CPAP)Breast CA(HCC), now admitted with dizziness increasing in intensity past 3 days  Accompanied with N/V on day of admit , and blurry vision  For 2 hours 11/16.Has noted some dizziness past 2 weeks but not as severe . Symptoms worse with head movements .                                                 She denies fever, chills, sob, cp. Denies, abd pain or diarrhea .  IN ER  afebrile and hemodynamically stable.  No leukocytosis. K 4.7, hgb 8.9   I-STAT troponin 0.3 > 0.24.  EKG not suggestive of ACS.  CT head showing no acute finding.  MRI =Mild chronic ischemic change of the white matter without acute intracranial abnormality.   This am ate all breakfast  No further nausea and dizzy slightly better. Noted last OP  HD   11/16 pulled 4liter uf with bp 136/111 pr e to 127/88  Low for her . Usually never pulls more than 3.5 and bp recently (USING LEG CUFF 22/ Obesity) pre hd =  244/99 , 154/100 . Dr Augustin Coupe started Midodrine  Pre hd sec low bp on hd 11/11/9. Noted also she is now transient Pt. Plans to move back to [place]this week (visiting Son and family here,[facility].)      Past Medical History:  Diagnosis Date  . Arthritis   . Breast cancer ([place])   . CAD (coronary artery disease)    a. minimal CAD by cath in 10/2014.  Marland Kitchen CKD (chronic kidney disease)   . COPD (chronic obstructive pulmonary disease) ([place])   . Diabetes mellitus with ESRD (end-stage renal disease) ([place])   . GERD (gastroesophageal reflux disease)   . Hypertension   . Renal disorder    Family  reports acute renal failure  . Sleep apnea    does not wear CPAP  . Systolic CHF, chronic ([place]) 12/23/2014    Past Surgical History:  Procedure Laterality Date  . ARTERIOVENOUS GRAFT PLACEMENT Right 10/05/15  . AV FISTULA PLACEMENT Right 10/26/2014   Procedure: Right Brachiocephalic Arteriovenous FISTULA CREATION;  Surgeon: Conrad Mayflower, MD;  Location: [facility];  Service: Vascular;  Laterality: Right;  . AV FISTULA PLACEMENT Right 10/05/2015   Procedure: INSERTION OF  ARTERIOVENOUS (AV) GORE-TEX GRAFT RIGHT ARM;  Surgeon: Conrad Morrison, MD;  Location: [facility];  Service: Vascular;  Laterality: Right;  . BASCILIC VEIN TRANSPOSITION Right 04/13/2015   Procedure: RIGHT FIRST STAGE BASCILIC VEIN TRANSPOSITION;  Surgeon: Conrad Lake [place], MD;  Location: [facility];  Service: Vascular;  Laterality: Right;  . BASCILIC VEIN TRANSPOSITION Right 07/11/2015   Procedure: SECOND STAGE BASILIC VEIN TRANSPOSITION;  Surgeon: Conrad Hale, MD;  Location: [facility];  Service: Vascular;  Laterality: Right;  . BREAST SURGERY Left   . CARDIAC CATHETERIZATION [name] 10/19/2014   Procedure: Right/Left Heart Cath and Coronary Angiography;  Surgeon: Ander Slade  Martinique, MD;  Location: [facility];  Service: Cardiovascular;  Laterality: [name];  . FISTULA SUPERFICIALIZATION Right 01/26/2015   Procedure: Right BRACHIOCEPHALIC ARTERIOVENOUS FISTULA SUPERFICIALIZATION with side branch ligation;  Surgeon: Conrad Downs, MD;  Location: [facility];  Service: Vascular;  Laterality: Right;  . IR DIALY SHUNT INTRO [place] W/IMG RIGHT Right 08/23/2016  . IR GENERIC HISTORICAL  04/30/2016   IR REMOVAL TUN CV CATH W/O FL 04/30/2016 Aletta Edouard, MD [facility]-INTERV RAD  . IR US GUIDE VASC ACCESS RIGHT  08/23/2016  . LIGATION OF ARTERIOVENOUS  FISTULA Right 03/07/2015   Procedure: LIGATION OF RIGHT BRACHIOCEPHALIC ARTERIOVENOUS  FISTULA;  Surgeon: Conrad [place]ville, MD;  Location: [facility];  Service: Vascular;  Laterality: Right;  . LIGATION OF ARTERIOVENOUS   FISTULA Right 10/05/2015   Procedure: LIGATION OF RIGHT BASILIC VEIN TRANSPOSITION; EXCISION OF CICATRIX;  Surgeon: Conrad Violet, MD;  Location: [facility];  Service: Vascular;  Laterality: Right;  . PERIPHERAL VASCULAR CATHETERIZATION [name] 01/24/2015   Procedure: Fistulagram;  Surgeon: Conrad Parker, MD;  Location: [facility];  Service: Cardiovascular;  Laterality: [name];  . PERIPHERAL VASCULAR CATHETERIZATION [name] 08/29/2015   Procedure: Fistulagram;  Surgeon: Conrad Fredonia, MD;  Location: [facility];  Service: Cardiovascular;  Laterality: [name];  . PERIPHERAL VASCULAR CATHETERIZATION Right 08/29/2015   Procedure: Peripheral Vascular Balloon Angioplasty;  Surgeon: Conrad Ona, MD;  Location: [facility];  Service: Cardiovascular;  Laterality: Right;  arm fistula  . TEE WITHOUT CARDIOVERSION [name] 12/27/2014   Procedure: TRANSESOPHAGEAL ECHOCARDIOGRAM (TEE);  Surgeon: Thayer Headings, MD;  Location: [facility];  Service: Cardiovascular;  Laterality: [name];  . THROMBECTOMY W/ EMBOLECTOMY Right 03/07/2015   Procedure: THROMBECTOMY OF RIGHT BRACHIOCEPHALIC ARTERIOVENOUS FISTULA;  Surgeon: Conrad Agra, MD;  Location: [facility];  Service: Vascular;  Laterality: Right;  . THROMBECTOMY W/ EMBOLECTOMY Right 01/27/2016   Procedure: THROMBECTOMY AND REVISION OF RIGHT UPPER ARM ARTERIOVENOUS GORE-TEX GRAFT;  Surgeon: Angelia Mould, MD;  Location[facility];  Service: Vascular;  Laterality: Right;      Family History  Problem Relation Age of Onset  . Diabetes Mother   . Hypertension Mother   . Stroke Sister   . Hypertension Sister   . Heart attack Neg Hx       reports that she has quit smoking. She has never used smokeless tobacco. She reports that she does not drink alcohol or use drugs.   Allergies  Allergen Reactions  . Vicodin [Hydrocodone-Acetaminophen] Itching  . Adhesive [Tape] Itching, Rash and Other (See Comments)    Please use "paper" tape    Prior to Admission medications    Medication Sig Start Date End Date Taking? Authorizing Provider  aspirin EC 81 MG tablet Take 81 mg by mouth See admin instructions. Take one tablet (81 mg) by mouth every Monday, Wednesday, Friday and Sunday morning (non-dialysis days)   Yes [provider]  Calcium Carbonate Antacid (TUMS ULTRA 1000 PO) Take 1,000 mg by mouth 3 (three) times daily with meals.   Yes [provider]  carvedilol (COREG) 6.25 MG tablet Take 1 tablet (6.25 mg total) by mouth See admin instructions. Take 6.25 mg by mouth twice daily on Monday, Wednesday, Friday and Sunday. Patient taking differently: Take 6.25 mg by mouth See admin instructions. Take one tablet ( 6.25 mg) by mouth twice daily on Monday, Wednesday, Friday and Sunday. (non-dialysis days) 11/09/16  Yes Leone Haven, MD  clotrimazole ([place])  1 % cream Apply 1 application topically 2 (two) times daily as needed (rash under breasts).  12/04/17  Yes [provider]  gabapentin (NEURONTIN) 300 MG capsule Take 300 mg by mouth See admin instructions. Take one capsule (300 mg) by mouth every Monday, Wednesday, Friday and Sunday morning (non-dialysis days)   Yes [provider]  LEVEMIR FLEXTOUCH 100 UNIT/ML Pen INJECT 55 UNITS INTO THE SKIN AT BEDTIME. Patient taking differently: Inject 55 Units into the skin every evening.  07/11/17  Yes Leone Haven, MD  midodrine (PROAMATINE) 5 MG tablet Take 5 mg by mouth See admin instructions. Take one tablet (5 mg) by mouth during dialysis (Tuesday, Thursday, Saturday) as needed for low blood pressure <79/30 06/21/17  Yes [provider]  Multiple Vitamin (MULTIVITAMIN WITH MINERALS) TABS tablet Take 1 tablet by mouth See admin instructions. Take one tablet by mouth every Monday, Wednesday, Friday and Sunday morning (non-dialysis days) - Women's fusion   Yes [provider]  omeprazole (PRILOSEC) 20 MG capsule Take 20 mg by mouth See admin instructions. Take one  capsule (20 mg) by mouth on Monday, Wednesday, Friday and Sunday morning (non-dialysis days)   Yes [provider]  glucose blood (ONETOUCH VERIO) test strip Test once daily 09/17/17   Leone Haven, MD  Insulin Pen Needle (BD PEN NEEDLE NANO U/F) 32G X 4 MM MISC Use as directed 09/17/17   Leone Haven, MD  Lancets [facility] ULTRASOFT) lancets Use to check blood sugars 2-3 times daily as directed. 09/19/17   Leone Haven, MD    SUP:[id number], clotrimazole, ipratropium-albuterol, promethazine  Results for orders placed or performed during the hospital encounter of 03/03/18 (from the past 48 hour(s))  CBG monitoring, ED     Status: Abnormal   Collection Time: 03/03/18  2:32 PM  Result Value Ref Range   Glucose-Capillary 129 (H) 70 - 99 mg/dL  Basic metabolic panel     Status: Abnormal   Collection Time: 03/03/18  3:04 PM  Result Value Ref Range   Sodium 134 (L) 135 - 145 mmol/L   Potassium 4.7 3.5 - 5.1 mmol/L   Chloride 98 98 - 111 mmol/L   CO2 25 22 - 32 mmol/L   Glucose, Bld 139 (H) 70 - 99 mg/dL   BUN 33 (H) 8 - 23 mg/dL   Creatinine, Ser 10.19 (H) 0.44 - 1.00 mg/dL   Calcium 8.6 (L) 8.9 - 10.3 mg/dL   GFR calc non Af Amer 3 (L) >60 mL/min   GFR calc Af Amer 4 (L) >60 mL/min    Comment: (NOTE) The eGFR has been calculated using the CKD EPI equation. This calculation has not been validated in all clinical situations. eGFR's persistently <60 mL/min signify possible Chronic Kidney Disease.    Anion gap 11 5 - 15    Comment: Performed at [facility] [address], [place], [place] [address]  CBC     Status: Abnormal   Collection Time: 03/03/18  3:04 PM  Result Value Ref Range   WBC 7.6 4.0 - 10.5 K/uL   RBC 3.92 3.87 - 5.11 MIL/uL   Hemoglobin 10.5 (L) 12.0 - 15.0 g/dL   HCT 35.7 (L) 36.0 - 46.0 %   MCV 91.1 80.0 - 100.0 fL   MCH 26.8 26.0 - 34.0 pg   MCHC 29.4 (L) 30.0 - 36.0 g/dL   RDW 15.2 11.5 - 15.5 %   Platelets 181 150 - 400 K/uL  nRBC 0.0 0.0 - 0.2 %    Comment: Performed at [facility], [place] [address], [place], [place] [address]  I-Stat Troponin, ED (not at [facility])     Status: Abnormal   Collection Time: 03/03/18  3:13 PM  Result Value Ref Range   Troponin i, poc 0.30 (HH) 0.00 - 0.08 ng/mL   Comment NOTIFIED PHYSICIAN    Comment 3            Comment: Due to the release kinetics of cTnI, a negative result within the first hours of the onset of symptoms does not rule out myocardial infarction with certainty. If myocardial infarction is still suspected, repeat the test at appropriate intervals.   I-Stat Troponin, ED (not at [facility])     Status: Abnormal   Collection Time: 03/03/18  5:04 PM  Result Value Ref Range   Troponin i, poc 0.24 (HH) 0.00 - 0.08 ng/mL   Comment NOTIFIED PHYSICIAN    Comment 3            Comment: Due to the release kinetics of cTnI, a negative result within the first hours of the onset of symptoms does not rule out myocardial infarction with certainty. If myocardial infarction is still suspected, repeat the test at appropriate intervals.   Troponin I - Now Then Q6H     Status: Abnormal   Collection Time: 03/03/18  7:58 PM  Result Value Ref Range   Troponin I 0.22 (HH) <0.03 ng/mL    Comment: CRITICAL RESULT CALLED TO, READ BACK BY AND VERIFIED WITH: PEREZ P,RN 03/03/18 2058 WAYK Performed at [facility], [place] [address] [place], [place] [address]   Glucose, capillary     Status: Abnormal   Collection Time: 03/03/18  9:31 PM  Result Value Ref Range   Glucose-Capillary 154 (H) 70 - 99 mg/dL  Troponin I - Now Then Q6H     Status: Abnormal   Collection Time: 03/04/18  2:10 AM  Result Value Ref Range   Troponin I 0.18 (HH) <0.03 ng/mL    Comment: CRITICAL VALUE NOTED.  VALUE IS CONSISTENT WITH PREVIOUSLY REPORTED AND CALLED VALUE. Performed at [facility], [place] [address] [place], [place] [address]   Troponin I - Now Then Q6H     Status: Abnormal   Collection  Time: 03/04/18  7:08 AM  Result Value Ref Range   Troponin I 0.48 (HH) <0.03 ng/mL    Comment: CRITICAL RESULT CALLED TO, READ BACK BY AND VERIFIED WITH: V.SMITH,RN 0815 03/04/18 CLARK,S Performed at [facility], [place] [address] [place], [place] [address]   CBC     Status: Abnormal   Collection Time: 03/04/18  7:08 AM  Result Value Ref Range   WBC 6.8 4.0 - 10.5 K/uL   RBC 3.33 (L) 3.87 - 5.11 MIL/uL   Hemoglobin 8.9 (L) 12.0 - 15.0 g/dL   HCT 29.8 (L) 36.0 - 46.0 %   MCV 89.5 80.0 - 100.0 fL   MCH 26.7 26.0 - 34.0 pg   MCHC 29.9 (L) 30.0 - 36.0 g/dL   RDW 15.2 11.5 - 15.5 %   Platelets 182 150 - 400 K/uL   nRBC 0.0 0.0 - 0.2 %    Comment: Performed at [facility], [place] [address], [place], [place] [address]  Glucose, capillary     Status: None   Collection Time: 03/04/18  8:04 AM  Result Value Ref Range   Glucose-Capillary 97 70 -  99 mg/dL    ROS: see hpi   Physical Exam: Vitals:   03/03/18 2115 03/04/18 0617  BP: (!) 165/94 (!) 128/49  Pulse: 86 64  Resp:  17  Temp: 98.8 F (37.1 C) 98.6 F (37 C)  SpO2: 99% 96%     General: alert Obese AAF [place] , NAD  HEENT: [place] , eomi ,MMM Neck: Supple, no jvd  Heart: RRR , no m,r,g  Lungs: CTA  Abdomen: Grossly obese, bs pos. Soft , nt,nd  Extremities: trace bipedal edema  Skin: no pedal ulcers, od overt rash  Neuro: alert, OX3, moves all extrem. No asterixs  Dialysis Access: pos bruit RUA AVG   Dialysis Orders: Center: [facility] on TTS . EDW 123kg HD Bath 2l, 2.5 ca  Time 4hr Heparin 8000. Access RUA  AVG     Calcitriol 0.75 mcg po /HD  No esa      Assessment/Plan 1. ESRD - HD TTS   2.  Dizziness / Vertigo - ? Element of low bp on hd last tx exacerbating vertigo - wu  per admit , today on hd uf 1 l 3. Hypertension/volume  - BP ok. On low dose coreg. //recent  Midodrine 59m pre hd tts hd start  4. Anemia  - hgb  10.5 > 8.9  ?recheck hgb pre hd if still <11 start esa (not on as op ) 5. Metabolic bone disease  -  Po vit d on hd  6. DM type 2  7. HO CAD    DErnest Haber PA-C [facility] [place] [phone]/19/2019, 8:56 AM

## 2018-03-04 NOTE — Progress Notes (Signed)
CRITICAL VALUE ALERT  Critical Value:  Troponin 0.48  Date & Time Notied:  11/19 8:17  Provider Notified: Dr. Candiss Norse  Orders Received/Actions taken: Pending

## 2018-03-04 NOTE — Progress Notes (Signed)
Report given to Hemo-dialysis. 

## 2018-03-04 NOTE — CV Procedure (Signed)
Echocardiogram not complete, patient was taken to dialysis.   Darlina Sicilian RDCS

## 2018-03-04 NOTE — Procedures (Signed)
Patient seen and examined on Hemodialysis. QB 350 AVF, UF goal 1700  Treatment adjusted as needed.  Madelon Lips MD [facility] pgr [phone] 4:53 PM

## 2018-03-05 ENCOUNTER — Observation Stay (HOSPITAL_BASED_OUTPATIENT_CLINIC_OR_DEPARTMENT_OTHER): Payer: Medicare Other

## 2018-03-05 ENCOUNTER — Telehealth: Payer: Self-pay | Admitting: Family Medicine

## 2018-03-05 DIAGNOSIS — R7989 Other specified abnormal findings of blood chemistry: Secondary | ICD-10-CM | POA: Diagnosis not present

## 2018-03-05 DIAGNOSIS — N186 End stage renal disease: Secondary | ICD-10-CM | POA: Diagnosis not present

## 2018-03-05 DIAGNOSIS — I361 Nonrheumatic tricuspid (valve) insufficiency: Secondary | ICD-10-CM

## 2018-03-05 DIAGNOSIS — Z992 Dependence on renal dialysis: Secondary | ICD-10-CM | POA: Diagnosis not present

## 2018-03-05 DIAGNOSIS — R42 Dizziness and giddiness: Secondary | ICD-10-CM | POA: Diagnosis not present

## 2018-03-05 LAB — ECHOCARDIOGRAM COMPLETE
Height: 65 in
Weight: 4483.27 oz

## 2018-03-05 LAB — TROPONIN I: Troponin I: 0.51 ng/mL (ref <0.03)

## 2018-03-05 LAB — GLUCOSE, CAPILLARY
Glucose-Capillary: 97 mg/dL (ref 70–99)
Glucose-Capillary: 184 mg/dL — ABNORMAL HIGH (ref 70–99)

## 2018-03-05 MED ORDER — MECLIZINE HCL 25 MG PO TABS: 25.0000 mg | ORAL_TABLET | Freq: Three times a day (TID) | ORAL | 0 refills | Status: DC | PRN

## 2018-03-05 MED ORDER — ATORVASTATIN CALCIUM 40 MG PO TABS: 40.0000 mg | ORAL_TABLET | Freq: Every day | ORAL | 0 refills | Status: DC

## 2018-03-05 NOTE — Telephone Encounter (Signed)
No t able to reach patient by phone left message to call office. Per DC note patient has gone to [place] to stay with daughter for 3 weeks.

## 2018-03-05 NOTE — Progress Notes (Signed)
Physical Therapy Treatment Patient Details Name: Jordan [name] MRN: [id number] DOB: 1941/02/07 Today's Date: 03/05/2018    History of Present Illness Jordan [name] is a 77 y.o. female with medical history significant of CAD, COPD, ESRD on HD (TTS), type 2 diabetes, hypertension, chronic diastolic congestive heart failure presenting to the hospital for evaluation of dizziness and nausea.  Symptoms started 2 weeks ago.  Patient has been feeling intermittently dizzy, almost on a daily basis.  States 3 days ago her dizziness was associated with nausea at dialysis and she was told her blood pressure was too low and dialysis had to be stopped prematurely.  She again felt dizzy yesterday and this morning her vision was blurry when watching television.  Blurriness of vision lasted about 2 hours.    PT Comments    Pt admitted with above diagnosis. Pt currently with functional limitations due to balance and endurance deficits. Pt was able to ambulate in room with good safety with RW.  Going home with daughter and will continue vestibular rehab with HHPT.  Issued HEP via email for pt to follow .  Pt will benefit from skilled PT to increase their independence and safety with mobility to allow discharge to the venue listed below.     Follow Up Recommendations  Home health PT;Supervision/Assistance - 24 hour(Vestibular rehab)     Equipment Recommendations  None recommended by PT    Recommendations for Other Services       Precautions / Restrictions Precautions Precautions: Fall Precaution Comments: Dizziness Restrictions Weight Bearing Restrictions: No    Mobility  Bed Mobility Overal bed mobility: Needs Assistance Bed Mobility: Supine to Sit     Supine to sit: Min guard     General bed mobility comments: Increased time  Transfers Overall transfer level: Needs assistance Equipment used: Rolling walker (2 wheeled) Transfers: Sit to/from Stand Sit to Stand: Min  guard Stand pivot transfers: Min guard       General transfer comment: Min Guard A for safety. Increased time and effort  Ambulation/Gait Ambulation/Gait assistance: Min guard Gait Distance (Feet): 50 Feet Assistive device: Rolling walker (2 wheeled) Gait Pattern/deviations: Step-through pattern;Decreased stride length;Trunk flexed;Wide base of support   Gait velocity interpretation: <1.31 ft/sec, indicative of household ambulator General Gait Details: Pt was safe walking short distance with RW.  Pt does have flexed posture.     Stairs             Wheelchair Mobility    Modified Rankin (Stroke Patients Only)       Balance Overall balance assessment: Needs assistance Sitting-balance support: No upper extremity supported;Feet supported Sitting balance-Leahy Scale: Fair     Standing balance support: Bilateral upper extremity supported;During functional activity Standing balance-Leahy Scale: Poor Standing balance comment: relies on bil UE support  did not need external support                            Cognition Arousal/Alertness: Awake/alert Behavior During Therapy: WFL for tasks assessed/performed Overall Cognitive Status: Within Functional Limits for tasks assessed                                        Exercises General Exercises - Lower Extremity Ankle Circles/Pumps: AROM;Both;10 reps;Supine Long Arc Quad: AROM;Both;10 reps;Seated Other Exercises Other Exercises: Reviewed x1 exercises with pt able to perform up to 30  seconds side to side.  Other Exercises: Access Code: [email or web address] - Vestibular exercises emailed to pt [email or web address]    General Comments General comments (skin integrity, edema, etc.): VSS.  Dizziness much less per pt to day.  STill a 4/10 but better.       Pertinent Vitals/Pain Pain Assessment: No/denies pain    Home Living Family/patient expects to be discharged to:: Private residence Living Arrangements:  Alone Available Help at Discharge: Family;Available 24 hours/day Type of Home: House Home Access: Level entry     Home Equipment: Walker - 2 wheels;Walker - 4 wheels;Grab bars - tub/shower;Tub bench;Hand held shower head;Bedside commode Additional Comments: Pt staying with daughter here visiting.  Daughter has 24 steps into her apartment.  Pt has her 4 wheeled RW and tub bench here.  Other equipment is at her home in [place].  Pt has a level entry at her home and is returning first week of December.     Prior Function Level of Independence: Needs assistance  Gait / Transfers Assistance Needed: used RW at all times per pt ADL's / Homemaking Assistance Needed: Pt independent as she normally lives alone Comments: Does not drive   PT Goals (current goals can now be found in the care plan section) Acute Rehab PT Goals Patient Stated Goal: to go home first week of December to [place] Progress towards PT goals: Progressing toward goals    Frequency    Min 3X/week      PT Plan Current plan remains appropriate    Co-evaluation              AM-PAC PT "6 Clicks" Daily Activity  Outcome Measure  Difficulty turning over in bed (including adjusting bedclothes, sheets and blankets)?: None Difficulty moving from lying on back to sitting on the side of the bed? : A Little Difficulty sitting down on and standing up from a chair with arms (e.g., wheelchair, bedside commode, etc,.)?: A Little Help needed moving to and from a bed to chair (including a wheelchair)?: A Little Help needed walking in hospital room?: A Little Help needed climbing 3-5 steps with a railing? : A Lot 6 Click Score: 18    End of Session Equipment Utilized During Treatment: Gait belt Activity Tolerance: Patient limited by fatigue Patient left: with call bell/phone within reach;in bed(at EOB) Nurse Communication: Mobility status PT Visit Diagnosis: Unsteadiness on feet (R26.81);Muscle weakness (generalized)  (M62.81);Dizziness and giddiness (R42);BPPV BPPV - Right/Left : Left     Time: 5681-2751 PT Time Calculation (min) (ACUTE ONLY): 23 min  Charges:  $Gait Training: 8-22 mins $Therapeutic Exercise: 8-22 mins                     [place] Pager:  [phone]  Office:  [place] 03/05/2018, 2:30 PM

## 2018-03-05 NOTE — Care Management Note (Addendum)
Case Management Note  Patient Details  Name: Jordan [name] MRN: [id number] Date of Birth: [birth year]  Subjective/Objective:    Dizziness/ nausea. Hx of CAD, COPD, ESRD on HD(TTS), type 2 diabetes, hypertension, chronic diastolic congestive heart failure.  Jordan [name] (daughter) [phone]  Action/Plan: Transition to home with Jordan [name]( daughter) with home health services to follow. Pt states lives in [place], however , will reside with daughter for 3 weeks once d/c. Daughter's address: [address]  Per PT's evaluation/ecommendations :Home health PT for Vestibular rehab;Supervision/Assistance - 24 hour, pt may want to follow up with ENT as well for further vestibular testing  Daughter /pt states she will have 24/7 supervision /assist once d/c.  Daughter to provide transportation to home.  Expected Discharge Date:  03/05/18               Expected Discharge Plan:  [place]  In-House Referral:     Discharge planning Services  CM Consult  Post Acute Care Choice:    Choice offered to:  Patient  DME Arranged:    n/a DME Agency:   n/a  HH Arranged:  PT [place]:  [place] [place] (now [facility])  Status of Service:  Completed, signed off  If discussed at [employer] of Stay Meetings, dates discussed:    Additional Comments:  Jordan Mons, Jordan [name] 03/05/2018, 12:30 PM

## 2018-03-05 NOTE — Progress Notes (Signed)
  Echocardiogram 2D Echocardiogram has been performed.  Jordan [name] 03/05/2018, 9:48 AM

## 2018-03-05 NOTE — Progress Notes (Signed)
Subjective:  Dizziness improving ,noted for DC today,  tolerated hd yest  ,   Objective Vital signs in last 24 hours: Vitals:   03/05/18 0350 03/05/18 0700 03/05/18 0845 03/05/18 1100  BP: (!) 142/46 (!) 118/45 (!) 123/51 (!) 119/37  Pulse: 83 78 81 68  Resp: 16     Temp:  98.8 F (37.1 C)  98.2 F (36.8 C)  TempSrc:  Oral  Oral  SpO2: 97% 95%  100%  Weight:      Height:       Weight change: -0.086 kg  Physical Exam: General: alert, Pleasant , NAD   Heart: RRR , no m,r,g  Lungs: CTA  Abdomen: Grossly obese, bs pos. Soft , nt,nd  Extremities: trace bipedal edema  Dialysis Access: pos bruit RUA AVG   Dialysis Orders: [facility] . EDW 123kg HD Bath 2l, 2.5 ca  Time 4hr Heparin 8000. Access RUA  AVG     Calcitriol 0.75 mcg po /HD  No esa      Problem/Plan: 1. ESRD - HD TTS   2. Dizziness / Vertigo -  placed on meclizine and Valium, PT and OT for vestibular rehab and clinically better.also possible  Element of low bp on hd last OP tx exacerbating vertigo -  For dc today with op PT  3. Hypertension/volume  - BP ok, hd uf 1 liter yesterday with  Post wt not accurate Elinor Parkinson ~~3kg with post wt ) On low dose coreg. //recent  Midodrine 5mg  pre hd tts hd start  4. Anemia  - hgb  10.5 > 8.9 /   started  esa (not on as op )Aranesp 40 given 11/19 hd  5. Metabolic bone disease -  Po vit d on hd  6. DM type 2  7. HO CAD    Ernest Haber, PA-C [facility] Beeper [phone] 03/05/2018,12:40 PM  LOS: 0 days   Labs: Basic Metabolic Panel: Recent Labs  Lab 03/03/18 1504 03/04/18 1416  NA 134* 137  K 4.7 4.4  CL 98 99  CO2 25 26  GLUCOSE 139* 143*  BUN 33* 43*  CREATININE 10.19* 12.03*  CALCIUM 8.6* 8.5*  PHOS  --  5.1*   Liver Function Tests: Recent Labs  Lab 03/04/18 1416  ALBUMIN 3.2*   No results for input(s): LIPASE, AMYLASE in the last 168 hours. No results for input(s): AMMONIA in the last 168 hours. CBC: Recent Labs  Lab  03/03/18 1504 03/04/18 0708  WBC 7.6 6.8  HGB 10.5* 8.9*  HCT 35.7* 29.8*  MCV 91.1 89.5  PLT 181 182   Cardiac Enzymes: Recent Labs  Lab 03/03/18 1958 03/04/18 0210 03/04/18 0708 03/05/18 0719  TROPONINI 0.22* 0.18* 0.48* 0.51*   CBG: Recent Labs  Lab 03/04/18 1209 03/04/18 1821 03/04/18 2123 03/05/18 0805 03/05/18 1219  GLUCAP 110* 129* 172* 97 184*    Studies/Results: Ct Head Wo Contrast  Result Date: 03/03/2018 CLINICAL DATA:  Dizziness for 2 weeks. Hypertension and diabetes. Stroke suspected. EXAM: CT HEAD WITHOUT CONTRAST TECHNIQUE: Contiguous axial images were obtained from the base of the skull through the vertex without intravenous contrast. COMPARISON:  12/23/2014 FINDINGS: Brain: Mild low density in the periventricular white matter likely related to small vessel disease. Cerebral and cerebellar atrophy may be age-appropriate. No mass lesion, hemorrhage, hydrocephalus, acute infarct, intra-axial, or extra-axial fluid collection. Vascular: No hyperdense vessel or unexpected calcification.  None. Skull: Normal. Sinuses/Orbits: Normal imaged portions of the orbits and globes. Clear paranasal  sinuses and mastoid air cells. Other: None. IMPRESSION: 1.  No acute intracranial abnormality. 2.  Cerebral atrophy and small vessel ischemic change. Electronically Signed   By: Abigail Miyamoto M.D.   On: 03/03/2018 17:54   Mr Brain Wo Contrast  Result Date: 03/03/2018 CLINICAL DATA:  Episodic vertigo EXAM: MRI HEAD WITHOUT CONTRAST TECHNIQUE: Multiplanar, multiecho pulse sequences of the brain and surrounding structures were obtained without intravenous contrast. COMPARISON:  Head CT 03/03/2018 FINDINGS: BRAIN: There is no acute infarct, acute hemorrhage, hydrocephalus or extra-axial collection. Partially empty sella. There are no old infarcts. Mild multifocal white matter hyperintensity. The cerebral and cerebellar volume are age-appropriate. Susceptibility-sensitive sequences show no  chronic microhemorrhage or superficial siderosis. VASCULAR: Major intracranial arterial and venous sinus flow voids are normal. SKULL AND UPPER CERVICAL SPINE: Calvarial bone marrow signal is normal. There is no skull base mass. Visualized upper cervical spine and soft tissues are normal. SINUSES/ORBITS: Bilateral mastoid effusions. Paranasal sinuses are clear. The orbits are normal. IMPRESSION: 1. Mild chronic ischemic change of the white matter without acute intracranial abnormality. 2. Bilateral small mastoid effusions. Electronically Signed   By: Ulyses Jarred M.D.   On: 03/03/2018 22:44   Medications:  . aspirin EC  81 mg Oral Once per day on Sun Mon Wed Fri  . atorvastatin  40 mg Oral q1800  . calcitRIOL  0.75 mcg Oral Q T,Th,Sa-HD  . calcium carbonate  1,000 mg Oral TID WC  . carvedilol  6.25 mg Oral 2 times per day on Sun Mon Wed Fri   And  . carvedilol  6.25 mg Oral Once per day on Tue Thu Sat  . darbepoetin (ARANESP) injection - DIALYSIS  40 mcg Intravenous Q Tue-HD  . diazepam  2 mg Oral BID  . gabapentin  300 mg Oral Once per day on Sun Mon Wed Fri  . heparin  5,000 Units Subcutaneous Q8H  . insulin aspart  0-9 Units Subcutaneous TID WC  . insulin detemir  55 Units Subcutaneous QHS  . meclizine  25 mg Oral TID  . midodrine  5 mg Oral Q M,W,F-HD  . multivitamin with minerals  1 tablet Oral Once per day on Sun Mon Wed Fri  . pantoprazole  20 mg Oral Once per day on Sun Mon Wed Fri

## 2018-03-05 NOTE — Discharge Summary (Signed)
Jordan [name], is a 77 y.o. female  DOB Aug 17, 1940  MRN [id number].  Admission date:  03/03/2018  Admitting Physician  Shela Leff, MD  Discharge Date:  03/05/2018   Primary MD  Leone Haven, MD  Recommendations for primary care physician for things to follow:  -Please continue vestibular PT   Admission Diagnosis  Dizziness [R42]   Discharge Diagnosis  Dizziness [R42]    Principal Problem:   Vertigo Active Problems:   Hypertension   COPD (chronic obstructive pulmonary disease) (HCC)   Elevated troponin   ESRD (end stage renal disease) on dialysis ([place])   Type 2 diabetes mellitus with diabetic nephropathy (HCC)   Chronic diastolic CHF (congestive heart failure) (HCC)   GERD (gastroesophageal reflux disease)   CAD (coronary artery disease)   Anemia, chronic disease   Diabetic neuropathy ([place])      Past Medical History:  Diagnosis Date  . Arthritis   . Breast cancer ([place])   . CAD (coronary artery disease)    a. minimal CAD by cath in 10/2014.  Marland Kitchen CKD (chronic kidney disease)   . COPD (chronic obstructive pulmonary disease) ([place])   . Diabetes mellitus with ESRD (end-stage renal disease) ([place])   . GERD (gastroesophageal reflux disease)   . Hypertension   . Renal disorder    Family reports acute renal failure  . Sleep apnea    does not wear CPAP  . Systolic CHF, chronic ([place]) 12/23/2014    Past Surgical History:  Procedure Laterality Date  . ARTERIOVENOUS GRAFT PLACEMENT Right 10/05/15  . AV FISTULA PLACEMENT Right 10/26/2014   Procedure: Right Brachiocephalic Arteriovenous FISTULA CREATION;  Surgeon: Conrad Rio, MD;  Location: [place];  Service: Vascular;  Laterality: Right;  . AV FISTULA PLACEMENT Right 10/05/2015   Procedure: INSERTION OF  ARTERIOVENOUS (AV) GORE-TEX GRAFT RIGHT ARM;  Surgeon: Conrad Leona, MD;  Location: [place];  Service: Vascular;  Laterality:  Right;  . BASCILIC VEIN TRANSPOSITION Right 04/13/2015   Procedure: RIGHT FIRST STAGE BASCILIC VEIN TRANSPOSITION;  Surgeon: Conrad Plattville, MD;  Location: [place];  Service: Vascular;  Laterality: Right;  . BASCILIC VEIN TRANSPOSITION Right 07/11/2015   Procedure: SECOND STAGE BASILIC VEIN TRANSPOSITION;  Surgeon: Conrad [place] Creek, MD;  Location: [place];  Service: Vascular;  Laterality: Right;  . BREAST SURGERY Left   . CARDIAC CATHETERIZATION [name] 10/19/2014   Procedure: Right/Left Heart Cath and Coronary Angiography;  Surgeon: Peter M Martinique, MD;  Location: [place] [facility];  Service: Cardiovascular;  Laterality: [name];  . FISTULA SUPERFICIALIZATION Right 01/26/2015   Procedure: Right BRACHIOCEPHALIC ARTERIOVENOUS FISTULA SUPERFICIALIZATION with side branch ligation;  Surgeon: Conrad , MD;  Location: [place];  Service: Vascular;  Laterality: Right;  . IR DIALY SHUNT INTRO [place] W/IMG RIGHT Right 08/23/2016  . IR GENERIC HISTORICAL  04/30/2016   IR REMOVAL TUN CV CATH W/O FL 04/30/2016 Aletta Edouard, MD [facility]-INTERV RAD  . IR US GUIDE VASC ACCESS RIGHT  08/23/2016  . LIGATION OF  ARTERIOVENOUS  FISTULA Right 03/07/2015   Procedure: LIGATION OF RIGHT BRACHIOCEPHALIC ARTERIOVENOUS  FISTULA;  Surgeon: Conrad Truxton, MD;  Location: [place];  Service: Vascular;  Laterality: Right;  . LIGATION OF ARTERIOVENOUS  FISTULA Right 10/05/2015   Procedure: LIGATION OF RIGHT BASILIC VEIN TRANSPOSITION; EXCISION OF CICATRIX;  Surgeon: Conrad [place] Ridge, MD;  Location: [place];  Service: Vascular;  Laterality: Right;  . PERIPHERAL VASCULAR CATHETERIZATION [name] 01/24/2015   Procedure: Fistulagram;  Surgeon: Conrad Norton, MD;  Location: [place] [facility];  Service: Cardiovascular;  Laterality: [name];  . PERIPHERAL VASCULAR CATHETERIZATION [name] 08/29/2015   Procedure: Fistulagram;  Surgeon: Conrad Froid, MD;  Location: [place] [facility];  Service: Cardiovascular;  Laterality: [name];  . PERIPHERAL VASCULAR CATHETERIZATION  Right 08/29/2015   Procedure: Peripheral Vascular Balloon Angioplasty;  Surgeon: Conrad Pulpotio Bareas, MD;  Location: [place] [facility];  Service: Cardiovascular;  Laterality: Right;  arm fistula  . TEE WITHOUT CARDIOVERSION [name] 12/27/2014   Procedure: TRANSESOPHAGEAL ECHOCARDIOGRAM (TEE);  Surgeon: Thayer Headings, MD;  Location: [place];  Service: Cardiovascular;  Laterality: [name];  . THROMBECTOMY W/ EMBOLECTOMY Right 03/07/2015   Procedure: THROMBECTOMY OF RIGHT BRACHIOCEPHALIC ARTERIOVENOUS FISTULA;  Surgeon: Conrad , MD;  Location: [place];  Service: Vascular;  Laterality: Right;  . THROMBECTOMY W/ EMBOLECTOMY Right 01/27/2016   Procedure: THROMBECTOMY AND REVISION OF RIGHT UPPER ARM ARTERIOVENOUS GORE-TEX GRAFT;  Surgeon: Angelia Mould, MD;  Location: [place];  Service: Vascular;  Laterality: Right;       History of present illness and  Hospital Course:     Kindly see H&P for history of present illness and admission details, please review complete Labs, Consult reports and Test reports for all details in brief  HPI  from the history and physical done on the day of admission 03/03/2018 HPI: Jordan [name] is a 77 y.o. female with medical history significant of CAD, COPD, ESRD on HD (TTS), type 2 diabetes, hypertension, chronic diastolic congestive heart failure presenting to the hospital for evaluation of dizziness and nausea.  Symptoms started 2 weeks ago.  Patient has been feeling intermittently dizzy, almost on a daily basis.  States 3 days ago her dizziness was associated with nausea at dialysis and she was told her blood pressure was too low and dialysis had to be stopped prematurely.  She again felt dizzy yesterday and this morning her vision was blurry when watching television.  Blurriness of vision lasted about 2 hours and it is not blurry anymore.  She denies having a room spinning sensation and describes the dizziness more like lightheadedness.  It is worse with head  movement.  States her appetite has been okay.  She did vomit once today and some fluid came out.  She is not nauseous at this time.  Denies having any headaches.  Denies having any chest pain, shortness of breath, fevers, chills, or abdominal pain.  ED Course: Afebrile and hemodynamically stable.  No leukocytosis.  I-STAT troponin 0.3 > 0.24.  EKG not suggestive of ACS.  CT head showing no acute finding.    Hospital Course   Brief Narrative  Jordan Cavendish Reed-Grieris a 77 y.o.femalewith medical history significant ofCAD, COPD, ESRD on HD(TTS), type 2 diabetes, hypertension, chronic diastolic congestive heart failure presenting to the hospital for evaluation of dizziness and nausea.Symptoms started 2 weeks ago. Patient has been feeling intermittently dizzy, almost on a daily basis. States 3 days ago her dizziness was associated with nausea atdialysis  and she was told her blood pressure was too low and dialysis had to be stopped prematurely, she was admitted for further work-up,.  1.  Vertigo.  Suspicious for BPPV.  CT head MRI brain unremarkable, no focal signs, does have a positive nystagmus on right lateral gaze, placed on meclizine and Valium with significant improvement of her symptoms, was seen by PT/OT, recommendation for outpatient vestibular PT, she will be discharged with PRN meclizine.  2.  ESRD.    He is on dialysis on TTS schedule, was dialyzed in the hospital on Tuesday, resume hemodialysis as an outpatient TTS schedule, patient already receiving dialysis as an outpatient in [place] .  3.  Mildly elevated troponin.  Flat trend and non-ACS pattern, reviewing records, appears to be clinically elevated no chest pain, EKG shows left bundle branch block which is, old records reviewed from [place].  Continue aspirin and Coreg, add statin for secondary prevention.    Echo showing improved EF 50 to 55%, with no regional wall motion abnormalities,.  Note echo reviewed from [place]  with a EF of 45%, LVH and global hypokinesis.  She has no chest symptoms.  4.  Essential hypertension.  Currently on Coreg continue and monitor.  5.  Obesity.  Follow with PCP in [place] post discharge.  6.  Chronic disease.  Stable no acute issues.  7. COPD.  No acute issues.  Supportive care.    8.  GERD.  Continue PPI.    9. DM type II peripheral neuropathy.  On Levemir and sliding scale.  Continue Neurontin for neuropathy  Discharge Condition:  stable   Follow UP  Follow-up Information    [facility] Follow up.   Specialty:  Home Health Services Why:  home health vestibular PT  arranged Contact information: [address] [place] [address] [phone]             Discharge Instructions  and  Discharge Medications    Discharge Instructions    Discharge instructions   Complete by:  As directed    Follow with Primary MD Leone Haven, MD in 7 days   Get CBC, CMP,  checked  by Primary MD next visit.    Activity: As tolerated with Full fall precautions use walker/cane & assistance as needed   Disposition Home    Diet: Heart Healthy ,carbohydrate modified,  RENAL DIET with 1200 cc fluid restrictions, with feeding assistance and aspiration precautions.  For Heart failure patients - Check your Weight same time everyday, if you gain over 2 pounds, or you develop in leg swelling, experience more shortness of breath or chest pain, call your Primary MD immediately. Follow Cardiac Low Salt Diet and 1.5 lit/day fluid restriction.   On your next visit with your primary care physician please Get Medicines reviewed and adjusted.   Please request your Prim.MD to go over all Hospital Tests and Procedure/Radiological results at the follow up, please get all Hospital records sent to your Prim MD by signing hospital release before you go home.   If you experience worsening of your admission symptoms, develop shortness of breath, life  threatening emergency, suicidal or homicidal thoughts you must seek medical attention immediately by calling 911 or calling your MD immediately  if symptoms less severe.  You Must read complete instructions/literature along with all the possible adverse reactions/side effects for all the Medicines you take and that have been prescribed to you. Take any new Medicines after you have completely understood  and accpet all the possible adverse reactions/side effects.   Do not drive, operating heavy machinery, perform activities at heights, swimming or participation in water activities or provide baby sitting services if your were admitted for syncope or siezures until you have seen by Primary MD or a Neurologist and advised to do so again.  Do not drive when taking Pain medications.    Do not take more than prescribed Pain, Sleep and Anxiety Medications  Special Instructions: If you have smoked or chewed Tobacco  in the last 2 yrs please stop smoking, stop any regular Alcohol  and or any Recreational drug use.  Wear Seat belts while driving.   Please note  You were cared for by a hospitalist during your hospital stay. If you have any questions about your discharge medications or the care you received while you were in the hospital after you are discharged, you can call the unit and asked to speak with the hospitalist on call if the hospitalist that took care of you is not available. Once you are discharged, your primary care physician will handle any further medical issues. Please note that NO REFILLS for any discharge medications will be authorized once you are discharged, as it is imperative that you return to your primary care physician (or establish a relationship with a primary care physician if you do not have one) for your aftercare needs so that they can reassess your need for medications and monitor your lab values.   Increase activity slowly   Complete by:  As directed      Allergies as of  03/05/2018      Reactions   Vicodin [hydrocodone-acetaminophen] Itching   Adhesive [tape] Itching, Rash, Other (See Comments)   Please use "paper" tape      Medication List    TAKE these medications   aspirin EC 81 MG tablet Take 81 mg by mouth See admin instructions. Take one tablet (81 mg) by mouth every Monday, Wednesday, Friday and Sunday morning (non-dialysis days)   atorvastatin 40 MG tablet Commonly known as:  LIPITOR Take 1 tablet (40 mg total) by mouth daily at 6 PM.   carvedilol 6.25 MG tablet Commonly known as:  COREG Take 1 tablet (6.25 mg total) by mouth See admin instructions. Take 6.25 mg by mouth twice daily on Monday, Wednesday, Friday and Sunday. What changed:  additional instructions   clotrimazole 1 % cream Commonly known as:  LOTRIMIN Apply 1 application topically 2 (two) times daily as needed (rash under breasts).   gabapentin 300 MG capsule Commonly known as:  NEURONTIN Take 300 mg by mouth See admin instructions. Take one capsule (300 mg) by mouth every Monday, Wednesday, Friday and Sunday morning (non-dialysis days)   glucose blood test strip Test once daily   Insulin Pen Needle 32G X 4 MM Misc Use as directed   LEVEMIR FLEXTOUCH 100 UNIT/ML Pen Generic drug:  Insulin Detemir INJECT 55 UNITS INTO THE SKIN AT BEDTIME. What changed:  See the new instructions.   meclizine 25 MG tablet Commonly known as:  ANTIVERT Take 1 tablet (25 mg total) by mouth 3 (three) times daily as needed for dizziness.   midodrine 5 MG tablet Commonly known as:  PROAMATINE Take 5 mg by mouth See admin instructions. Take one tablet (5 mg) by mouth during dialysis (Tuesday, Thursday, Saturday) as needed for low blood pressure <79/30   multivitamin with minerals Tabs tablet Take 1 tablet by mouth See admin instructions. Take one tablet by  mouth every Monday, Wednesday, Friday and Sunday morning (non-dialysis days) - Women's fusion   omeprazole 20 MG capsule Commonly  known as:  PRILOSEC Take 20 mg by mouth See admin instructions. Take one capsule (20 mg) by mouth on Monday, Wednesday, Friday and Sunday morning (non-dialysis days)   onetouch ultrasoft lancets Use to check blood sugars 2-3 times daily as directed.   TUMS ULTRA 1000 PO Take 1,000 mg by mouth 3 (three) times daily with meals.         Diet and Activity recommendation: See Discharge Instructions above   Consults obtained -  renal   Major procedures and Radiology Reports - PLEASE review detailed and final reports for all details, in brief -      Ct Head Wo Contrast  Result Date: 03/03/2018 CLINICAL DATA:  Dizziness for 2 weeks. Hypertension and diabetes. Stroke suspected. EXAM: CT HEAD WITHOUT CONTRAST TECHNIQUE: Contiguous axial images were obtained from the base of the skull through the vertex without intravenous contrast. COMPARISON:  12/23/2014 FINDINGS: Brain: Mild low density in the periventricular white matter likely related to small vessel disease. Cerebral and cerebellar atrophy may be age-appropriate. No mass lesion, hemorrhage, hydrocephalus, acute infarct, intra-axial, or extra-axial fluid collection. Vascular: No hyperdense vessel or unexpected calcification.  None. Skull: Normal. Sinuses/Orbits: Normal imaged portions of the orbits and globes. Clear paranasal sinuses and mastoid air cells. Other: None. IMPRESSION: 1.  No acute intracranial abnormality. 2.  Cerebral atrophy and small vessel ischemic change. Electronically Signed   By: Abigail Miyamoto M.D.   On: 03/03/2018 17:54   Mr Brain Wo Contrast  Result Date: 03/03/2018 CLINICAL DATA:  Episodic vertigo EXAM: MRI HEAD WITHOUT CONTRAST TECHNIQUE: Multiplanar, multiecho pulse sequences of the brain and surrounding structures were obtained without intravenous contrast. COMPARISON:  Head CT 03/03/2018 FINDINGS: BRAIN: There is no acute infarct, acute hemorrhage, hydrocephalus or extra-axial collection. Partially empty sella.  There are no old infarcts. Mild multifocal white matter hyperintensity. The cerebral and cerebellar volume are age-appropriate. Susceptibility-sensitive sequences show no chronic microhemorrhage or superficial siderosis. VASCULAR: Major intracranial arterial and venous sinus flow voids are normal. SKULL AND UPPER CERVICAL SPINE: Calvarial bone marrow signal is normal. There is no skull base mass. Visualized upper cervical spine and soft tissues are normal. SINUSES/ORBITS: Bilateral mastoid effusions. Paranasal sinuses are clear. The orbits are normal. IMPRESSION: 1. Mild chronic ischemic change of the white matter without acute intracranial abnormality. 2. Bilateral small mastoid effusions. Electronically Signed   By: Ulyses Jarred M.D.   On: 03/03/2018 22:44    Micro Results    No results found for this or any previous visit (from the past 240 hour(s)).     Today   Subjective:   Jordan [name] today has no headache,no chest abdominal pain,no new weakness tingling or numbness, feels much better wants to go home today.   Objective:   Blood pressure (!) 119/37, pulse 68, temperature 98.2 F (36.8 C), temperature source Oral, resp. rate 16, height 5\' 5"  (1.651 m), weight 127.1 kg, SpO2 100 %.   Intake/Output Summary (Last 24 hours) at 03/05/2018 1228 Last data filed at 03/05/2018 0830 Gross per 24 hour  Intake 240 ml  Output 1000 ml  Net -760 ml    Exam Awake Alert, Oriented x 3, No new F.N deficits, Normal affect Symmetrical Chest wall movement, Good air movement bilaterally, CTAB RRR,No Gallops,Rubs or new Murmurs, No Parasternal Heave +ve B.Sounds, Abd Soft, Non tender,No rebound -guarding or rigidity. No Cyanosis, Clubbing or  edema, No new Rash or bruise  Data Review   CBC w Diff:  Lab Results  Component Value Date   WBC 6.8 03/04/2018   HGB 8.9 (L) 03/04/2018   HCT 29.8 (L) 03/04/2018   PLT 182 03/04/2018   LYMPHOPCT 10 (L) 10/25/2014   MONOPCT 7 10/25/2014    EOSPCT 2 10/25/2014   BASOPCT 0 10/25/2014    CMP:  Lab Results  Component Value Date   NA 137 03/04/2018   K 4.4 03/04/2018   CL 99 03/04/2018   CO2 26 03/04/2018   BUN 43 (H) 03/04/2018   CREATININE 12.03 (H) 03/04/2018   PROT 6.9 10/12/2014   ALBUMIN 3.2 (L) 03/04/2018   BILITOT 0.2 (L) 10/12/2014   ALKPHOS 101 10/12/2014   AST 59 (H) 10/12/2014   ALT 37 10/12/2014  .   Total Time in preparing paper work, data evaluation and todays exam - 6 minutes  [place] Climes M.D on 03/05/2018 at 12:28 PM  [facility]   Office  [phone]

## 2018-03-05 NOTE — Evaluation (Signed)
Occupational Therapy Evaluation Patient Details Name: Jordan [name] MRN: [id number] DOB: October 23, 1940 Today's Date: 03/05/2018    History of Present Illness Jordan [name] is a 77 y.o. female with medical history significant of CAD, COPD, ESRD on HD (TTS), type 2 diabetes, hypertension, chronic diastolic congestive heart failure presenting to the hospital for evaluation of dizziness and nausea.  Symptoms started 2 weeks ago.  Patient has been feeling intermittently dizzy, almost on a daily basis.  States 3 days ago her dizziness was associated with nausea at dialysis and she was told her blood pressure was too low and dialysis had to be stopped prematurely.  She again felt dizzy yesterday and this morning her vision was blurry when watching television.  Blurriness of vision lasted about 2 hours.   Clinical Impression   PTA, pt was living alone and was independent; pt visiting her daughter from [place]. Pt requiring Min Guard A for LB ADLs and functional mobility with RW. Pt presenting with decreased balance, strength, and activity tolerance. Pt also reporting dizziness and nausea with movement. Pt would benefit from further acute OT to facilitate safe dc. Recommend dc to home with HHOT for further OT to optimize safety, independence with ADLs, and return to PLOF.      Follow Up Recommendations  Home health OT;Supervision/Assistance - 24 hour    Equipment Recommendations  None recommended by OT    Recommendations for Other Services PT consult     Precautions / Restrictions Precautions Precautions: Fall Precaution Comments: Dizziness      Mobility Bed Mobility Overal bed mobility: Needs Assistance Bed Mobility: Supine to Sit     Supine to sit: Min guard     General bed mobility comments: Increased time  Transfers Overall transfer level: Needs assistance Equipment used: Rolling walker (2 wheeled) Transfers: Sit to/from Stand Sit to Stand: Min guard          General transfer comment: Min Guard A for safety. Increased time and effort    Balance Overall balance assessment: Needs assistance Sitting-balance support: No upper extremity supported;Feet supported Sitting balance-Leahy Scale: Fair     Standing balance support: Single extremity supported;During functional activity Standing balance-Leahy Scale: Poor Standing balance comment: relies on bil UE support one on furniture and one on PT to pivot.  Uses RW at home.                            ADL either performed or assessed with clinical judgement   ADL Overall ADL's : Needs assistance/impaired Eating/Feeding: Independent   Grooming: Oral care;Set up;Supervision/safety;Sitting   Upper Body Bathing: Set up;Supervision/ safety;Sitting Upper Body Bathing Details (indicate cue type and reason): sponge bath at sink with supervision and set up Lower Body Bathing: Min guard;Sit to/from stand   Upper Body Dressing : Set up;Supervision/safety;Sitting   Lower Body Dressing: Min guard;Sit to/from stand Lower Body Dressing Details (indicate cue type and reason): Pt able to don underwear. Min Guard A for Office manager: Min guard;Ambulation;RW(simulated to chair)           Functional mobility during ADLs: Min guard;Rolling walker General ADL Comments: Requiring increased time and effort.     Vision         Perception     Praxis      Pertinent Vitals/Pain Pain Assessment: No/denies pain     Hand Dominance Right   Extremity/Trunk Assessment Upper Extremity Assessment Upper Extremity Assessment: Generalized weakness  Lower Extremity Assessment Lower Extremity Assessment: Defer to PT evaluation   Cervical / Trunk Assessment Cervical / Trunk Assessment: Other exceptions Cervical / Trunk Exceptions: Increased body habitus   Communication Communication Communication: HOH   Cognition Arousal/Alertness: Awake/alert Behavior During Therapy: WFL for tasks  assessed/performed Overall Cognitive Status: Within Functional Limits for tasks assessed                                     General Comments  VSS. Reporing dizziness with positional changes. No nystagmus noted    Exercises     Shoulder Instructions      Home Living Family/patient expects to be discharged to:: Private residence Living Arrangements: Alone Available Help at Discharge: Family;Available 24 hours/day Type of Home: House Home Access: Level entry           Bathroom Shower/Tub: Occupational psychologist: Standard     Home Equipment: Environmental consultant - 2 wheels;Walker - 4 wheels;Grab bars - tub/shower;Tub bench;Hand held shower head;Bedside commode   Additional Comments: Pt staying with daughter here visiting.  Daughter has 24 steps into her apartment.  Pt has her 4 wheeled RW and tub bench here.  Other equipment is at her home in [place].  Pt has a level entry at her home and is returning first week of December.       Prior Functioning/Environment Level of Independence: Needs assistance  Gait / Transfers Assistance Needed: used RW at all times per pt ADL's / Homemaking Assistance Needed: Pt independent as she normally lives alone   Comments: Does not drive        OT Problem List: Decreased strength;Decreased activity tolerance;Impaired balance (sitting and/or standing);Decreased knowledge of use of DME or AE;Decreased knowledge of precautions;Pain;Obesity      OT Treatment/Interventions: Self-care/ADL training;Therapeutic exercise;Energy conservation;DME and/or AE instruction;Therapeutic activities;Patient/family education    OT Goals(Current goals can be found in the care plan section) Acute Rehab OT Goals Patient Stated Goal: to go home first week of December to [place] OT Goal Formulation: With patient Time For Goal Achievement: 03/19/18 Potential to Achieve Goals: Good ADL Goals Pt Will Perform Grooming: with modified independence;standing Pt Will  Perform Upper Body Dressing: with modified independence;sitting Pt Will Perform Lower Body Dressing: with modified independence;sit to/from stand Pt Will Transfer to Toilet: with modified independence;ambulating;regular height toilet Pt Will Perform Tub/Shower Transfer: Shower transfer;ambulating;rolling walker;with min guard assist  OT Frequency: Min 2X/week   Barriers to D/C:            Co-evaluation              AM-PAC PT "6 Clicks" Daily Activity     Outcome Measure Help from another person eating meals?: None Help from another person taking care of personal grooming?: None Help from another person toileting, which includes using toliet, bedpan, or urinal?: A Little Help from another person bathing (including washing, rinsing, drying)?: A Little Help from another person to put on and taking off regular upper body clothing?: None Help from another person to put on and taking off regular lower body clothing?: A Little 6 Click Score: 21   End of Session Equipment Utilized During Treatment: Rolling walker Nurse Communication: Mobility status  Activity Tolerance: Patient tolerated treatment well Patient left: in chair;with call bell/phone within reach;with nursing/sitter in room  OT Visit Diagnosis: Unsteadiness on feet (R26.81);Other abnormalities of gait and mobility (R26.89);Muscle weakness (generalized) (M62.81);Dizziness  and giddiness (R42)                Time: 6962-9528 OT Time Calculation (min): 20 min Charges:  OT General Charges $OT Visit: 1 Visit OT Evaluation $OT Eval Low Complexity: [place], OTR/L Acute Rehab Pager: [phone] Office: [place] 03/05/2018, 1:50 PM

## 2018-03-05 NOTE — Discharge Instructions (Signed)
Follow with Primary MD Leone Haven, MD in 7 days   Get CBC, CMP,  checked  by Primary MD next visit.    Activity: As tolerated with Full fall precautions use walker/cane & assistance as needed   Disposition Home    Diet: Heart Healthy ,carbohydrate modified,  RENAL DIET with 1200 cc fluid restrictions, with feeding assistance and aspiration precautions.  For Heart failure patients - Check your Weight same time everyday, if you gain over 2 pounds, or you develop in leg swelling, experience more shortness of breath or chest pain, call your Primary MD immediately. Follow Cardiac Low Salt Diet and 1.5 lit/day fluid restriction.   On your next visit with your primary care physician please Get Medicines reviewed and adjusted.   Please request your Prim.MD to go over all Hospital Tests and Procedure/Radiological results at the follow up, please get all Hospital records sent to your Prim MD by signing hospital release before you go home.   If you experience worsening of your admission symptoms, develop shortness of breath, life threatening emergency, suicidal or homicidal thoughts you must seek medical attention immediately by calling 911 or calling your MD immediately  if symptoms less severe.  You Must read complete instructions/literature along with all the possible adverse reactions/side effects for all the Medicines you take and that have been prescribed to you. Take any new Medicines after you have completely understood and accpet all the possible adverse reactions/side effects.   Do not drive, operating heavy machinery, perform activities at heights, swimming or participation in water activities or provide baby sitting services if your were admitted for syncope or siezures until you have seen by Primary MD or a Neurologist and advised to do so again.  Do not drive when taking Pain medications.    Do not take more than prescribed Pain, Sleep and Anxiety Medications  Special  Instructions: If you have smoked or chewed Tobacco  in the last 2 yrs please stop smoking, stop any regular Alcohol  and or any Recreational drug use.  Wear Seat belts while driving.   Please note  You were cared for by a hospitalist during your hospital stay. If you have any questions about your discharge medications or the care you received while you were in the hospital after you are discharged, you can call the unit and asked to speak with the hospitalist on call if the hospitalist that took care of you is not available. Once you are discharged, your primary care physician will handle any further medical issues. Please note that NO REFILLS for any discharge medications will be authorized once you are discharged, as it is imperative that you return to your primary care physician (or establish a relationship with a primary care physician if you do not have one) for your aftercare needs so that they can reassess your need for medications and monitor your lab values.

## 2018-03-05 NOTE — Progress Notes (Signed)
Discharge  Pt was able to participate with discharge teaching with grand-daughter at the bedside. Nursing student was able to d/c PIV and RN discussed d.c paper work. Pt was informed of new medications and to schedule her follow-up appt, including labs. Pt had no questions at that time about her instructions.

## 2018-03-07 ENCOUNTER — Telehealth: Payer: Self-pay | Admitting: Family Medicine

## 2018-03-07 NOTE — Telephone Encounter (Signed)
Copied from [place] [id number]. Topic: Quick Communication - See Telephone Encounter >> Mar 07, 2018 11:13 AM Blase Mess A wrote: CRM for notification. See Telephone encounter for: 03/07/18.  Patient is calling to get hospital follow up.  Patient was hospitalized on 03/03/18-03/05/18.  Hospital follow up is not available until 04/02/18.  Is an earlier available? Please advise [phone]

## 2018-03-07 NOTE — Telephone Encounter (Signed)
Patient needing hospital f/u, but no availability.

## 2018-03-07 NOTE — Telephone Encounter (Signed)
Just a FYI

## 2018-03-07 NOTE — Telephone Encounter (Signed)
Copied from [place] [phone]. Topic: Quick Communication - See Telephone Encounter >> Mar 07, 2018 12:54 PM Vernona Rieger wrote: CRM for notification. See Telephone encounter for: 03/07/18.  Darlina Guys with [facility] called to let Dr Caryl Bis know that they will go out tomorrow and start home health services.

## 2018-03-10 NOTE — Telephone Encounter (Addendum)
Where can I schedule patient for HFU  In the afternoon she can only come in the afternoon.

## 2018-03-10 NOTE — Telephone Encounter (Signed)
Copied from [place] [phone]. Topic: General - Other >> Mar 10, 2018  3:34 PM Yvette Rack wrote: Reason for CRM: Darlina Guys with [facility] called to advise that home health services will start on 03/12/18. Cb# [phone]

## 2018-03-10 NOTE — Telephone Encounter (Signed)
Noted  

## 2018-03-10 NOTE — Telephone Encounter (Signed)
Jordan [name] with [facility] called in and wanted to change the start date to 03/17/18.

## 2018-03-11 NOTE — Telephone Encounter (Signed)
Patient has been scheduled but due to patient staying with relatives she has been scheduled outside 14 day window for TCM. FYI

## 2018-03-11 NOTE — Telephone Encounter (Signed)
4:30 any day that I am in the office and there is an available 4:30 appointment.

## 2018-03-17 ENCOUNTER — Telehealth: Payer: Self-pay

## 2018-03-17 NOTE — Telephone Encounter (Signed)
Sent to PCP as an FYI  

## 2018-03-17 NOTE — Telephone Encounter (Signed)
Noted  

## 2018-03-17 NOTE — Telephone Encounter (Signed)
Copied from [place] [id number]. Topic: General - Other >> Mar 17, 2018  9:56 AM Oneta Rack wrote: Caller name: [place]  Relation to pt: LPN [facility]  Call back number: [phone]   Reason for call:  Patient declined PT visit today requesting PT to come back on Wednesday 03/19/18, PT  agreed and wanted to make PCP aware.

## 2018-03-24 ENCOUNTER — Encounter: Payer: Self-pay | Admitting: Family Medicine

## 2018-03-24 ENCOUNTER — Ambulatory Visit (INDEPENDENT_AMBULATORY_CARE_PROVIDER_SITE_OTHER): Payer: Medicare Other | Admitting: Family Medicine

## 2018-03-24 ENCOUNTER — Telehealth: Payer: Self-pay | Admitting: Family Medicine

## 2018-03-24 ENCOUNTER — Ambulatory Visit (INDEPENDENT_AMBULATORY_CARE_PROVIDER_SITE_OTHER): Payer: Medicare Other

## 2018-03-24 VITALS — BP 130/70 | HR 77 | Temp 97.9°F | Ht 65.0 in | Wt 278.0 lb

## 2018-03-24 DIAGNOSIS — H8110 Benign paroxysmal vertigo, unspecified ear: Secondary | ICD-10-CM

## 2018-03-24 DIAGNOSIS — Z794 Long term (current) use of insulin: Secondary | ICD-10-CM

## 2018-03-24 DIAGNOSIS — R059 Cough, unspecified: Secondary | ICD-10-CM

## 2018-03-24 DIAGNOSIS — E1121 Type 2 diabetes mellitus with diabetic nephropathy: Secondary | ICD-10-CM

## 2018-03-24 DIAGNOSIS — R05 Cough: Secondary | ICD-10-CM | POA: Diagnosis not present

## 2018-03-24 DIAGNOSIS — D638 Anemia in other chronic diseases classified elsewhere: Secondary | ICD-10-CM | POA: Diagnosis not present

## 2018-03-24 DIAGNOSIS — J40 Bronchitis, not specified as acute or chronic: Secondary | ICD-10-CM

## 2018-03-24 MED ORDER — DOXYCYCLINE HYCLATE 100 MG PO TABS: 100.0000 mg | ORAL_TABLET | Freq: Two times a day (BID) | ORAL | 0 refills | Status: DC

## 2018-03-24 NOTE — Patient Instructions (Signed)
Nice to see you. We will get a chest x-ray today and contact you with the results.  Once the results return we will send in an antibiotic. We will request records as well. Please continue therapy for your vertigo.

## 2018-03-24 NOTE — Telephone Encounter (Signed)
Spoke with patient.  Advised her of her x-ray results.  Discussed that there was no pneumonia.  We will treat her for bronchitis with doxycycline.  If not improving she will let us know.  She asked about a prescription for diabetic shoes and I advised that I believe that she would need to have a foot exam which we did not complete today.  I will forward to our clinical nurse to see if she knows for sure whether or not the patient would need a foot exam.

## 2018-03-25 NOTE — Telephone Encounter (Signed)
Yes, she will need to foot exam current and the initial prescription normally written by Podiatry.

## 2018-03-25 NOTE — Telephone Encounter (Signed)
Please let the patient know that she will need to see a podiatrist to get her prescription for diabetic shoes. She will also need to see me in the office for a foot exam. We could get her set up for both of these if she would like. Thanks.

## 2018-03-26 ENCOUNTER — Encounter: Payer: Self-pay | Admitting: Family Medicine

## 2018-03-26 ENCOUNTER — Other Ambulatory Visit: Payer: Self-pay | Admitting: Family Medicine

## 2018-03-26 DIAGNOSIS — H8110 Benign paroxysmal vertigo, unspecified ear: Secondary | ICD-10-CM | POA: Insufficient documentation

## 2018-03-26 DIAGNOSIS — J40 Bronchitis, not specified as acute or chronic: Secondary | ICD-10-CM | POA: Insufficient documentation

## 2018-03-26 DIAGNOSIS — Z794 Long term (current) use of insulin: Secondary | ICD-10-CM

## 2018-03-26 DIAGNOSIS — E1121 Type 2 diabetes mellitus with diabetic nephropathy: Secondary | ICD-10-CM

## 2018-03-26 MED ORDER — GLUCOSE BLOOD VI STRP: ORAL_STRIP | 12 refills | Status: AC

## 2018-03-26 MED ORDER — ONETOUCH ULTRASOFT LANCETS MISC: 12 refills | Status: DC

## 2018-03-26 NOTE — Telephone Encounter (Signed)
If she had a foot exam with her PCP in [place] she could have the podiatrist in [place] place the order for her diabetic shoes and the PCP in [place] could sign off on it if they are willing.  If they need me to sign off on the order for diabetic shoes I will need to complete a foot exam on her.  Her podiatrist would still need to place the initial order.  We could try to find some time sooner than January 29 for her to come in.

## 2018-03-26 NOTE — Assessment & Plan Note (Signed)
Symptoms are most consistent with bronchitis.  Will obtain a chest x-ray to evaluate for pneumonia.  Once the chest x-ray returns we will determine what antibiotic to place the patient on.

## 2018-03-26 NOTE — Telephone Encounter (Signed)
Copied from [place] [phone]. Topic: Quick Communication - Rx Refill/Question >> Mar 26, 2018 10:42 AM Alfredia Ferguson R wrote: Medication: meclizine (ANTIVERT) 25 MG tablet , Insulin Pen Needle (BD PEN NEEDLE NANO U/F) 32G X 4 MM MISC , glucose blood (ONETOUCH VERIO) test strip , Lancets (ONETOUCH ULTRASOFT) lancets  Has the patient contacted their pharmacy? Yes  Preferred Pharmacy (with phone number or street name): [id number] [place], [place] [phone] (Phone) [phone] (Fax)    Agent: Please be advised that RX refills may take up to 3 business days. We ask that you follow-up with your pharmacy.

## 2018-03-26 NOTE — Assessment & Plan Note (Signed)
Patient with anemia noted that did trend down slightly in the hospital though has trended back up on recheck through nephrology.  Nephrology will continue to monitor this.

## 2018-03-26 NOTE — Assessment & Plan Note (Signed)
Symptoms likely related to BPPV.  She had negative imaging.  She has improved with vestibular rehab.  She will continue with that and as needed meclizine.

## 2018-03-26 NOTE — Progress Notes (Signed)
[name], MD Phone: [phone]  Jordan [name] is a 77 y.o. female who presents today for f/u.  CC: Hospital follow-up for vertigo, bronchitis, anemia, diabetes  Patient was admitted to the hospital from 03/03/2018-03/05/2018 for vertigo symptoms.  She had been feeling intermittently dizzy for close to 2 weeks prior to admission.  Her dizziness then became associated with nausea at dialysis and she was advised her blood pressure was too low to continue.  Her vision became blurry with an episode of this dizziness at home and the blurry vision lasted about 2 hours.  She was subsequently evaluated in the emergency department.  She underwent CT head and MRI brain with no obvious cause noted.  They were suspicious for BPPV and the patient was seen by physical therapy and has been doing outpatient vestibular rehab.  She has been intermittently taking meclizine.  Her symptoms have been improving.  She was found to be anemic.  She had repeat labs through her nephrologist that revealed a hemoglobin of 9.9.  She notes no bleeding.  She also had an A1c checked which was 6.4.  Her diabetes medication is managed by her primary care physician in [place].  Patient notes she has been suffering with a deep cough for some time now.  She notes it is productive of white mucus.  She does feel congested in her chest.  She notes no sinus congestion or fevers.  She does note her breathing is shallow at times when she goes up steps that is been chronic and unchanged.  No edema.  No chest pain.  Social History   Tobacco Use  Smoking Status Former Smoker  Smokeless Tobacco Never Used     ROS see history of present illness  Objective  Physical Exam Vitals:   03/24/18 1121  BP: 130/70  Pulse: 77  Temp: 97.9 F (36.6 C)  SpO2: 96%    BP Readings from Last 3 Encounters:  03/24/18 130/70  03/05/18 (!) 119/37  09/17/17 108/64   Wt Readings from Last 3 Encounters:  03/24/18 278 lb (126.1 kg)    03/04/18 280 lb 3.3 oz (127.1 kg)  09/17/17 284 lb 6 oz (129 kg)    Physical Exam  Constitutional: No distress.  Cardiovascular: Normal rate, regular rhythm and normal heart sounds.  Pulmonary/Chest: Effort normal and breath sounds normal.  Musculoskeletal: She exhibits no edema.  Neurological: She is alert.  CN 2-12 intact, 5/5 strength in bilateral biceps, triceps, grip, quads, hamstrings, plantar and dorsiflexion, sensation to light touch intact in bilateral UE and LE  Skin: Skin is warm and dry. She is not diaphoretic.     Assessment/Plan: Please see individual problem list.  BPPV (benign paroxysmal positional vertigo) Symptoms likely related to BPPV.  She had negative imaging.  She has improved with vestibular rehab.  She will continue with that and as needed meclizine.  Anemia, chronic disease Patient with anemia noted that did trend down slightly in the hospital though has trended back up on recheck through nephrology.  Nephrology will continue to monitor this.  Bronchitis Symptoms are most consistent with bronchitis.  Will obtain a chest x-ray to evaluate for pneumonia.  Once the chest x-ray returns we will determine what antibiotic to place the patient on.  Type 2 diabetes mellitus with diabetic nephropathy ([place]) Well-controlled based on A1c.  We will request records from patient's PCP in [place].   Orders Placed This Encounter  Procedures  . DG Chest 2 View    Standing Status:  Future    Number of Occurrences:   1    Standing Expiration Date:   05/26/2019    Order Specific Question:   Reason for Exam (SYMPTOM  OR DIAGNOSIS REQUIRED)    Answer:   persistent productive cough for the past several weeks    Order Specific Question:   Preferred imaging location?    Answer:   [facility] Specific Question:   Radiology Contrast Protocol - do NOT remove file path    Answer:   [email or web address]    Meds  ordered this encounter  Medications  . Lancets (ONETOUCH ULTRASOFT) lancets    Sig: Use to check blood sugars 2-3 times daily as directed.    Dispense:  100 each    Refill:  12  . glucose blood (ONETOUCH VERIO) test strip    Sig: Test once daily    Dispense:  100 each    Refill:  [place], MD [place]

## 2018-03-26 NOTE — Assessment & Plan Note (Signed)
Well-controlled based on A1c.  We will request records from patient's PCP in [place].

## 2018-03-26 NOTE — Telephone Encounter (Signed)
Called and spoke with patient. Pt stated that she does have a Podiatrist in [place] who she just seen in October. Pt stated that she could just follow up with her. She wanted to know if she really needs to see you or not for the foot exam the next appt I could schedule patient for was January th 29th and she started around that time she would be going back to [place]. Does she have to have an appt with you for this?

## 2018-03-26 NOTE — Telephone Encounter (Signed)
Called patient and left a detailed VM. Will try to call patient back to make sure they did get the VM.

## 2018-03-27 MED ORDER — INSULIN PEN NEEDLE 32G X 4 MM MISC: 5 refills | Status: AC

## 2018-03-27 NOTE — Telephone Encounter (Signed)
Called and spoke with patient. Pt stated that she would like to see you for the foot exam. When could you try to see her?  Thanks

## 2018-03-27 NOTE — Telephone Encounter (Signed)
Please see if there are any 4:30 appointments available.

## 2018-03-27 NOTE — Telephone Encounter (Signed)
Requested medication (s) are due for refill today: Yes  Requested medication (s) are on the active medication list: Yes  Last refill:  03/05/18 by another provider at discharge from hospital  Future visit scheduled: No  Notes to clinic:  Unable to refill per protocol     Requested Prescriptions  Pending Prescriptions Disp Refills   meclizine (ANTIVERT) 25 MG tablet 30 tablet 0    Sig: Take 1 tablet (25 mg total) by mouth 3 (three) times daily as needed for dizziness.     Not Delegated - Gastroenterology: Antiemetics Failed - 03/26/2018 10:47 AM      Failed - This refill cannot be delegated      Passed - Valid encounter within last 6 months    Recent Outpatient Visits          3 days ago Cough   [facility] [place], Angela Adam, MD   6 months ago Left eye complaint   [place], Gregary Signs, [place]   1 year ago Paresthesia of left arm   [facility] Leone Haven, MD   1 year ago Atypical chest pain   [facility] Leone Haven, MD   3 years ago Diarrhea   [place], Eric G, MD           Signed Prescriptions Disp Refills   Insulin Pen Needle (BD PEN NEEDLE NANO U/F) 32G X 4 MM MISC 100 each 5    Sig: Use as directed     Endocrinology: Diabetes - Testing Supplies Passed - 03/26/2018 10:47 AM      Passed - Valid encounter within last 12 months    Recent Outpatient Visits          3 days ago Cough   [facility] [place] Leone Haven, MD   6 months ago Left eye complaint   [place]ier, Gregary Signs, [place]   1 year ago Paresthesia of left arm   [facility] Leone Haven, MD   1 year ago Atypical chest pain   [place], Eric G, MD   3 years ago Diarrhea   [facility], Angela Adam, MD

## 2018-03-28 MED ORDER — MECLIZINE HCL 25 MG PO TABS: 25.0000 mg | ORAL_TABLET | Freq: Three times a day (TID) | ORAL | 0 refills | Status: DC | PRN

## 2018-03-28 NOTE — Telephone Encounter (Signed)
Please see prior message from [place].

## 2018-03-28 NOTE — Telephone Encounter (Signed)
Juliann Pulse could you see if we can schedule patient for a 4:30 appt?

## 2018-03-28 NOTE — Telephone Encounter (Signed)
Patient scheduled.

## 2018-04-02 ENCOUNTER — Ambulatory Visit (INDEPENDENT_AMBULATORY_CARE_PROVIDER_SITE_OTHER): Payer: Medicare Other

## 2018-04-02 ENCOUNTER — Ambulatory Visit (INDEPENDENT_AMBULATORY_CARE_PROVIDER_SITE_OTHER): Payer: Medicare Other | Admitting: Family Medicine

## 2018-04-02 ENCOUNTER — Encounter: Payer: Self-pay | Admitting: Family Medicine

## 2018-04-02 VITALS — BP 150/70 | HR 75 | Temp 98.0°F | Wt 274.6 lb

## 2018-04-02 DIAGNOSIS — J40 Bronchitis, not specified as acute or chronic: Secondary | ICD-10-CM | POA: Diagnosis not present

## 2018-04-02 DIAGNOSIS — R0609 Other forms of dyspnea: Secondary | ICD-10-CM

## 2018-04-02 DIAGNOSIS — E1142 Type 2 diabetes mellitus with diabetic polyneuropathy: Secondary | ICD-10-CM | POA: Diagnosis not present

## 2018-04-02 DIAGNOSIS — R42 Dizziness and giddiness: Secondary | ICD-10-CM | POA: Diagnosis not present

## 2018-04-02 DIAGNOSIS — H8110 Benign paroxysmal vertigo, unspecified ear: Secondary | ICD-10-CM

## 2018-04-02 NOTE — Progress Notes (Signed)
[name], MD Phone: [phone]  Jordan [name] is a 77 y.o. female who presents today for follow-up.  CC: Diabetic neuropathy, bronchitis, lightheadedness/dizziness  Diabetic neuropathy: Patient presents for diabetic foot exam.  She does have neuropathy with burning in her bilateral feet and some numbness to monofilament testing.  She is on gabapentin though she is unsure if this helps.  She does see a podiatrist.  Bronchitis: Patient notes she is improved to some degree with her dry cough getting better with no production now though she continues to feel short of breath particularly on exertion.  She notes no chest pain.  She does note wheezing at night.  She finished the antibiotics yesterday.  Prior x-ray with no focal infiltrates.  Did reveal some central vascular congestion.  She reports she has been at her dry weight and has been undergoing dialysis consistently.  Lightheadedness/dizziness: Patient notes she was working with the physical therapist 2 days ago when she got up off the bed and felt dizzy and fell back onto the bed.  She reports the physical therapist felt as though she passed out though the patient does remember the physical therapist talking to her during this episode.  The patient reports the physical therapist wanted to call EMS and that the patient declined evaluation.  She does note she vomited after this.  She has not had any recurrence of this.  She had no chest pain with this.  She notes this is similar to the occurrence that required hospitalization previously.  She had an extensive evaluation for this at that time.  Social History   Tobacco Use  Smoking Status Former Smoker  Smokeless Tobacco Never Used     ROS see history of present illness  Objective  Physical Exam Vitals:   04/02/18 1515  BP: (!) 150/70  Pulse: 75  Temp: 98 F (36.7 C)  SpO2: 97%    BP Readings from Last 3 Encounters:  04/02/18 (!) 150/70  03/24/18 130/70    03/05/18 (!) 119/37   Wt Readings from Last 3 Encounters:  04/02/18 274 lb 9.6 oz (124.6 kg)  03/24/18 278 lb (126.1 kg)  03/04/18 280 lb 3.3 oz (127.1 kg)   Ambulatory oxygen saturation of 90% that recovered quickly with rest to 95%   Physical Exam Constitutional:      General: She is not in acute distress.    Appearance: She is not diaphoretic.  Cardiovascular:     Rate and Rhythm: Normal rate and regular rhythm.     Heart sounds: Normal heart sounds.  Pulmonary:     Effort: Pulmonary effort is normal. No respiratory distress.     Breath sounds: No stridor. Wheezing (Faint scattered wheezes with some coarseness) present. No rhonchi.  Skin:    General: Skin is warm and dry.  Neurological:     Mental Status: She is alert.    Diabetic Foot Exam - Simple   Simple Foot Form Diabetic Foot exam was performed with the following findings:  Yes 04/02/2018  3:37 PM  Visual Inspection See comments:  Yes Sensation Testing See comments:  Yes Pulse Check Posterior Tibialis and Dorsalis pulse intact bilaterally:  Yes Comments Decreased monofilament testing of bilateral feet, intact to light touch, there is onychomycosis over bilateral toenails, no apparent ulcerations or skin breakdown       EKG: Normal sinus rhythm with ectopic ventricular couplets, left bundle branch block, rate 71, no significant changes from prior with the exception of ectopic ventricular couplets  Assessment/Plan: Please see individual problem list.  Bronchitis Overall improved with improving cough and less cough production though does remain short of breath.  She does continue to have wheezing.  She was given a albuterol breathing treatment in the office and reported improvement in her dyspnea particularly when exerting herself.  Her lungs continue to exhibit wheezing.  We will treat with an albuterol inhaler at home.  We will get a chest x-ray and once this returns we will consider treating with prednisone.   If this appears to be a volume overload issue she will need to complete dialysis to her dry weight.  Diabetic neuropathy ([place]) Foot exam completed.  We will fax our note to the patient's podiatrist to see if they can write a prescription for diabetic shoes for [place] to sign off on.  BPPV (benign paroxysmal positional vertigo) The symptoms the patient had earlier this week are likely related to her vertigo and BPPV.  Patient reports symptoms were similar to her prior vertigo episode.  There was some concern of a syncopal episode though the patient does report remembering the physical therapist talking to her.  EKG is reassuringly similar to prior.  She has not had any recurrent symptoms.  She will monitor for any recurrence.  She will continue physical therapy.   Orders Placed This Encounter  Procedures  . DG Chest 2 View    Standing Status:   Future    Number of Occurrences:   1    Standing Expiration Date:   06/04/2019    Order Specific Question:   Reason for Exam (SYMPTOM  OR DIAGNOSIS REQUIRED)    Answer:   persistent dyspnea on exertion despite treatment for bronchitis, wheezing and coarseness on exam    Order Specific Question:   Preferred imaging location?    Answer:   [facility] Specific Question:   Radiology Contrast Protocol - do NOT remove file path    Answer:   \\[email or web address].pdf  . EKG 12-Lead    Meds ordered this encounter  Medications  . albuterol (PROVENTIL HFA;VENTOLIN HFA) 108 (90 Base) MCG/ACT inhaler    Sig: Inhale 2 puffs into the lungs every 6 (six) hours as needed for wheezing or shortness of breath.    Dispense:  1 Inhaler    Refill:  0     [name], MD [place]

## 2018-04-02 NOTE — Patient Instructions (Signed)
Nice to see you. We will get an x-ray today and contact you with results.  We will try to request your lab results from your nephrologist. We will contact you once the x-ray returns and consider starting prednisone.

## 2018-04-03 ENCOUNTER — Other Ambulatory Visit: Payer: Self-pay | Admitting: Family Medicine

## 2018-04-03 MED ORDER — ALBUTEROL SULFATE HFA 108 (90 BASE) MCG/ACT IN AERS: 2.0000 | INHALATION_SPRAY | Freq: Four times a day (QID) | RESPIRATORY_TRACT | 0 refills | Status: AC | PRN

## 2018-04-03 MED ORDER — PREDNISONE 20 MG PO TABS: 40.0000 mg | ORAL_TABLET | Freq: Every day | ORAL | 0 refills | Status: DC

## 2018-04-03 NOTE — Assessment & Plan Note (Addendum)
The symptoms the patient had earlier this week are likely related to her vertigo and BPPV.  Patient reports symptoms were similar to her prior vertigo episode.  There was some concern of a syncopal episode though the patient does report remembering the physical therapist talking to her.  EKG is reassuringly similar to prior.  She has not had any recurrent symptoms.  She will monitor for any recurrence.  She will continue physical therapy.

## 2018-04-03 NOTE — Assessment & Plan Note (Signed)
Overall improved with improving cough and less cough production though does remain short of breath.  She does continue to have wheezing.  She was given a albuterol breathing treatment in the office and reported improvement in her dyspnea particularly when exerting herself.  Her lungs continue to exhibit wheezing.  We will treat with an albuterol inhaler at home.  We will get a chest x-ray and once this returns we will consider treating with prednisone.  If this appears to be a volume overload issue she will need to complete dialysis to her dry weight.

## 2018-04-03 NOTE — Assessment & Plan Note (Signed)
Foot exam completed.  We will fax our note to the patient's podiatrist to see if they can write a prescription for diabetic shoes for Jordan [name] to sign off on.

## 2018-04-04 ENCOUNTER — Telehealth: Payer: Self-pay | Admitting: Family Medicine

## 2018-04-04 ENCOUNTER — Other Ambulatory Visit: Payer: Self-pay | Admitting: Family Medicine

## 2018-04-04 MED ORDER — ATORVASTATIN CALCIUM 40 MG PO TABS: 40.0000 mg | ORAL_TABLET | Freq: Every day | ORAL | 1 refills | Status: DC

## 2018-04-04 NOTE — Telephone Encounter (Signed)
Gabapentin filled by Historical provider OK to Fill?

## 2018-04-04 NOTE — Telephone Encounter (Signed)
Charted in result notes. 

## 2018-04-04 NOTE — Telephone Encounter (Signed)
Please contact the patient and confirm that the dosing is correct in the system.

## 2018-04-04 NOTE — Telephone Encounter (Signed)
Copied from [place] [id number]. Topic: Quick Communication - Lab Results (Clinic Use ONLY) >> Apr 04, 2018  8:17 AM Nanci Pina, LPN wrote: Called patient to inform them of 04/03/18 lab results. When patient returns call, triage nurse may disclose results.

## 2018-04-04 NOTE — Telephone Encounter (Signed)
Requested medication (s) are due for refill today: Yes  Requested medication (s) are on the active medication list: Yes  Last refill:  Atorvastatin 03/05/18 & Gabapentin (historical provider)  Future visit scheduled:No  Notes to clinic: Unable to refill per protocol, both refilled by another provider.     Requested Prescriptions  Pending Prescriptions Disp Refills   atorvastatin (LIPITOR) 40 MG tablet 30 tablet 0    Sig: Take 1 tablet (40 mg total) by mouth daily at 6 PM.     Cardiovascular:  Antilipid - Statins Failed - 04/04/2018  3:57 PM      Failed - Total Cholesterol in normal range and within 360 days    Cholesterol  Date Value Ref Range Status  10/12/2014 199 0 - 200 mg/dL Final         Failed - LDL in normal range and within 360 days    LDL Cholesterol  Date Value Ref Range Status  10/12/2014 123 (H) 0 - 99 mg/dL Final    Comment:           Total Cholesterol/HDL:CHD Risk Coronary Heart Disease Risk Table                     Men   Women  1/2 Average Risk   3.4   3.3  Average Risk       5.0   4.4  2 X Average Risk   9.6   7.1  3 X Average Risk  23.4   11.0        Use the calculated Patient Ratio above and the CHD Risk Table to determine the patient's CHD Risk.        ATP III CLASSIFICATION (LDL):  <100     mg/dL   Optimal  100-129  mg/dL   Near or Above                    Optimal  130-159  mg/dL   Borderline  160-189  mg/dL   High  >190     mg/dL   Very High          Failed - HDL in normal range and within 360 days    HDL  Date Value Ref Range Status  10/12/2014 53 >40 mg/dL Final         Failed - Triglycerides in normal range and within 360 days    Triglycerides  Date Value Ref Range Status  10/12/2014 113 <150 mg/dL Final         Passed - Patient is not pregnant      Passed - Valid encounter within last 12 months    Recent Outpatient Visits          2 days ago [facility] Leone [place], MD   1  week ago Cough   [facility] [place] Leone Haven, MD   6 months ago Left eye complaint   [place], Gregary Signs, [place]   1 year ago Paresthesia of left arm   [facility] Leone Haven, MD   1 year ago Atypical chest pain   [place], Angela Adam, MD            gabapentin (NEURONTIN) 300 MG capsule      Sig: Take 1 capsule (300 mg total) by mouth See admin instructions. Take one capsule (300 mg) by mouth every Monday,  Wednesday, Friday and Sunday morning (non-dialysis days)     Neurology: Anticonvulsants - gabapentin Passed - 04/04/2018  3:57 PM      Passed - Valid encounter within last 12 months    Recent Outpatient Visits          2 days ago [facility] Leone Haven, MD   1 week ago Cough   [facility] Leone Haven, MD   6 months ago Left eye complaint   [place], Gregary Signs, [place]   1 year ago Paresthesia of left arm   [facility] Leone Haven, MD   1 year ago Atypical chest pain   [facility] [place], Angela Adam, MD

## 2018-04-08 NOTE — Telephone Encounter (Signed)
Patient called and asked about the dosage of Gabapentin. I advised how it is listed in her chart to take 300 mg Mon-Wed-Fri-Sunday morning (non-dialysis days), she confirmed that is how she takes it. I advised this will be sent to Dr. Caryl Bis for review and ordering, she verbalized understanding.

## 2018-04-11 MED ORDER — GABAPENTIN 300 MG PO CAPS: 300.0000 mg | ORAL_CAPSULE | ORAL | 1 refills | Status: DC

## 2018-04-15 ENCOUNTER — Ambulatory Visit: Payer: Self-pay | Admitting: *Deleted

## 2018-04-15 NOTE — Telephone Encounter (Signed)
Pt calling with complaints of productive cough that has worsened since she was seen for visit on 12/18 with Dr. Caryl Bis. Pt states she has completed course of prednisone and antibiotics that she was given previously but feels that she is worse. Pt states she has a productive cough with greenish-white sputum. Pt denies fever at this time. Pt scheduled for appt on 04/18/18 with Lauren,NP. Pt advised to seek treatment in the ED/ Urgent Care with worsening symptoms. Pt verbalized understanding . Reason for Disposition . Cough has been present for > 3 weeks  Answer Assessment - Initial Assessment Questions 1. ONSET: "When did the cough begin?"      Cough got better but then it came back 2. SEVERITY: "How bad is the cough today?"      Cough is "at a 9" 3. RESPIRATORY DISTRESS: "Describe your breathing."     Sob with walking 4. FEVER: "Do you have a fever?" If so, ask: "What is your temperature, how was it measured, and when did it start?"     No 5. SPUTUM: "Describe the color of your sputum" (clear, white, yellow, green)     Greenish white 6. HEMOPTYSIS: "Are you coughing up any blood?" If so ask: "How much?" (flecks, streaks, tablespoons, etc.)     No 7. CARDIAC HISTORY: "Do you have any history of heart disease?" (e.g., heart attack, congestive heart failure)      No 8. LUNG HISTORY: "Do you have any history of lung disease?"  (e.g., pulmonary embolus, asthma, emphysema)     bronchitis 9. PE RISK FACTORS: "Do you have a history of blood clots?" (or: recent major surgery, recent prolonged travel, bedridden)     No 10. OTHER SYMPTOMS: "Do you have any other symptoms?" (e.g., runny nose, wheezing, chest pain)       Runny nose every now and then and snezzing 11. PREGNANCY: "Is there any chance you are pregnant?" "When was your last menstrual period?"       n/a 12. TRAVEL: "Have you traveled out of the country in the last month?" (e.g., travel history, exposures)       no  Protocols used: [place]

## 2018-04-18 ENCOUNTER — Inpatient Hospital Stay (HOSPITAL_COMMUNITY)
Admission: EM | Admit: 2018-04-18 | Discharge: 2018-04-21 | DRG: 193 | Disposition: A | Discharge status: Home Health | Payer: Medicare Other | Attending: Internal Medicine | Admitting: Internal Medicine

## 2018-04-18 ENCOUNTER — Emergency Department (HOSPITAL_COMMUNITY): Payer: Medicare Other

## 2018-04-18 ENCOUNTER — Other Ambulatory Visit: Payer: Self-pay

## 2018-04-18 ENCOUNTER — Encounter (HOSPITAL_COMMUNITY): Payer: Self-pay | Admitting: Emergency Medicine

## 2018-04-18 ENCOUNTER — Ambulatory Visit: Payer: Medicare Other | Admitting: Family Medicine

## 2018-04-18 DIAGNOSIS — E119 Type 2 diabetes mellitus without complications: Secondary | ICD-10-CM | POA: Diagnosis present

## 2018-04-18 DIAGNOSIS — N2581 Secondary hyperparathyroidism of renal origin: Secondary | ICD-10-CM | POA: Diagnosis present

## 2018-04-18 DIAGNOSIS — M898X9 Other specified disorders of bone, unspecified site: Secondary | ICD-10-CM | POA: Diagnosis present

## 2018-04-18 DIAGNOSIS — J189 Pneumonia, unspecified organism: Secondary | ICD-10-CM

## 2018-04-18 DIAGNOSIS — D631 Anemia in chronic kidney disease: Secondary | ICD-10-CM | POA: Diagnosis present

## 2018-04-18 DIAGNOSIS — Z6841 Body Mass Index (BMI) 40.0 and over, adult: Secondary | ICD-10-CM

## 2018-04-18 DIAGNOSIS — J449 Chronic obstructive pulmonary disease, unspecified: Secondary | ICD-10-CM | POA: Diagnosis present

## 2018-04-18 DIAGNOSIS — E785 Hyperlipidemia, unspecified: Secondary | ICD-10-CM | POA: Diagnosis present

## 2018-04-18 DIAGNOSIS — G4733 Obstructive sleep apnea (adult) (pediatric): Secondary | ICD-10-CM | POA: Diagnosis present

## 2018-04-18 DIAGNOSIS — Z885 Allergy status to narcotic agent status: Secondary | ICD-10-CM

## 2018-04-18 DIAGNOSIS — R0602 Shortness of breath: Secondary | ICD-10-CM

## 2018-04-18 DIAGNOSIS — E875 Hyperkalemia: Secondary | ICD-10-CM | POA: Diagnosis present

## 2018-04-18 DIAGNOSIS — Z23 Encounter for immunization: Secondary | ICD-10-CM

## 2018-04-18 DIAGNOSIS — Z87891 Personal history of nicotine dependence: Secondary | ICD-10-CM

## 2018-04-18 DIAGNOSIS — J440 Chronic obstructive pulmonary disease with acute lower respiratory infection: Secondary | ICD-10-CM | POA: Diagnosis present

## 2018-04-18 DIAGNOSIS — Z7952 Long term (current) use of systemic steroids: Secondary | ICD-10-CM

## 2018-04-18 DIAGNOSIS — R778 Other specified abnormalities of plasma proteins: Secondary | ICD-10-CM | POA: Diagnosis present

## 2018-04-18 DIAGNOSIS — I2510 Atherosclerotic heart disease of native coronary artery without angina pectoris: Secondary | ICD-10-CM | POA: Diagnosis present

## 2018-04-18 DIAGNOSIS — Z794 Long term (current) use of insulin: Secondary | ICD-10-CM

## 2018-04-18 DIAGNOSIS — I447 Left bundle-branch block, unspecified: Secondary | ICD-10-CM | POA: Diagnosis present

## 2018-04-18 DIAGNOSIS — Z992 Dependence on renal dialysis: Secondary | ICD-10-CM

## 2018-04-18 DIAGNOSIS — Y95 Nosocomial condition: Secondary | ICD-10-CM | POA: Diagnosis present

## 2018-04-18 DIAGNOSIS — I132 Hypertensive heart and chronic kidney disease with heart failure and with stage 5 chronic kidney disease, or end stage renal disease: Secondary | ICD-10-CM | POA: Diagnosis present

## 2018-04-18 DIAGNOSIS — E8889 Other specified metabolic disorders: Secondary | ICD-10-CM | POA: Diagnosis present

## 2018-04-18 DIAGNOSIS — I5022 Chronic systolic (congestive) heart failure: Secondary | ICD-10-CM | POA: Diagnosis present

## 2018-04-18 DIAGNOSIS — I890 Lymphedema, not elsewhere classified: Secondary | ICD-10-CM | POA: Diagnosis present

## 2018-04-18 DIAGNOSIS — E1122 Type 2 diabetes mellitus with diabetic chronic kidney disease: Secondary | ICD-10-CM | POA: Diagnosis present

## 2018-04-18 DIAGNOSIS — J441 Chronic obstructive pulmonary disease with (acute) exacerbation: Secondary | ICD-10-CM | POA: Diagnosis present

## 2018-04-18 DIAGNOSIS — Z853 Personal history of malignant neoplasm of breast: Secondary | ICD-10-CM

## 2018-04-18 DIAGNOSIS — J1000 Influenza due to other identified influenza virus with unspecified type of pneumonia: Principal | ICD-10-CM | POA: Diagnosis present

## 2018-04-18 DIAGNOSIS — I5042 Chronic combined systolic (congestive) and diastolic (congestive) heart failure: Secondary | ICD-10-CM | POA: Diagnosis present

## 2018-04-18 DIAGNOSIS — Z79899 Other long term (current) drug therapy: Secondary | ICD-10-CM

## 2018-04-18 DIAGNOSIS — R7989 Other specified abnormal findings of blood chemistry: Secondary | ICD-10-CM

## 2018-04-18 DIAGNOSIS — D72829 Elevated white blood cell count, unspecified: Secondary | ICD-10-CM | POA: Diagnosis present

## 2018-04-18 DIAGNOSIS — J9601 Acute respiratory failure with hypoxia: Secondary | ICD-10-CM | POA: Diagnosis present

## 2018-04-18 DIAGNOSIS — N186 End stage renal disease: Secondary | ICD-10-CM

## 2018-04-18 DIAGNOSIS — Z7982 Long term (current) use of aspirin: Secondary | ICD-10-CM

## 2018-04-18 LAB — CBC
HEMATOCRIT: 34 % — AB (ref 36.0–46.0)
Hemoglobin: 9.7 g/dL — ABNORMAL LOW (ref 12.0–15.0)
MCH: 26.9 pg (ref 26.0–34.0)
MCHC: 28.5 g/dL — AB (ref 30.0–36.0)
MCV: 94.2 fL (ref 80.0–100.0)
Platelets: 162 10*3/uL (ref 150–400)
RBC: 3.61 MIL/uL — ABNORMAL LOW (ref 3.87–5.11)
RDW: 15.8 % — AB (ref 11.5–15.5)
WBC: 12.2 10*3/uL — ABNORMAL HIGH (ref 4.0–10.5)
nRBC: 0 % (ref 0.0–0.2)

## 2018-04-18 LAB — BASIC METABOLIC PANEL
Anion gap: 11 (ref 5–15)
BUN: 44 mg/dL — AB (ref 8–23)
CHLORIDE: 98 mmol/L (ref 98–111)
CO2: 30 mmol/L (ref 22–32)
CREATININE: 8.32 mg/dL — AB (ref 0.44–1.00)
Calcium: 8.6 mg/dL — ABNORMAL LOW (ref 8.9–10.3)
GFR calc Af Amer: 5 mL/min — ABNORMAL LOW (ref >60)
GFR calc non Af Amer: 4 mL/min — ABNORMAL LOW (ref >60)
GLUCOSE: 185 mg/dL — AB (ref 70–99)
Potassium: 5.9 mmol/L — ABNORMAL HIGH (ref 3.5–5.1)
SODIUM: 139 mmol/L (ref 135–145)

## 2018-04-18 NOTE — Progress Notes (Deleted)
   Subjective:    Patient ID: Jordan [name], female    DOB: Apr 08, 1941, 78 y.o.   MRN: [id number]  HPI  Presents to clinic c/o productive cough x3 weeks  Patient Active Problem List   Diagnosis Date Noted  . BPPV (benign paroxysmal positional vertigo) 03/26/2018  . Bronchitis 03/26/2018  . Anemia, chronic disease 03/03/2018  . Diabetic neuropathy ([place]) 03/03/2018  . Sleep apnea   . Renal disorder   . GERD (gastroesophageal reflux disease)   . Diabetes mellitus with ESRD (end-stage renal disease) ([place])   . CKD (chronic kidney disease)   . CAD (coronary artery disease)   . Paresthesia of left arm 11/09/2016  . Chronic thoracic back pain 11/09/2016  . Personal history of breast cancer 08/26/2016  . Atypical chest pain 08/03/2016  . Chronic diastolic CHF (congestive heart failure) ([place]) 05/11/2015  . Loose stools 01/13/2015  . Skin irritation 01/13/2015  . Rib pain on left side 01/13/2015  . Bacteremia   . Systolic CHF, chronic ([place]) 12/23/2014  . Lymphedema of left arm 12/08/2014  . OSA (obstructive sleep apnea) 12/08/2014  . Constipation 12/08/2014  . Nonischemic cardiomyopathy ([place]) 11/10/2014  . Acute on chronic systolic heart failure, NYHA class 3 ([place]) 11/10/2014  . Morbidly obese ([place]) 11/10/2014  . ESRD (end stage renal disease) on dialysis ([place])   . Type 2 diabetes mellitus with diabetic nephropathy ([place])   . Essential hypertension   . Left hip pain   . NSTEMI (non-ST elevated myocardial infarction) ([place] ) 10/13/2014  . Pulmonary HTN ([place]) 10/13/2014  . LBBB (left bundle branch block) 10/12/2014  . Elevated troponin 10/12/2014  . Breast cancer ([place])   . Hypertension   . Arthritis   . COPD (chronic obstructive pulmonary disease) ([facility])    Social History   Tobacco Use  . Smoking status: Former Research scientist (life sciences)  . Smokeless tobacco: Never Used  Substance Use Topics  . Alcohol use: No    Alcohol/week: 0.0 standard drinks   Review of Systems  Constitutional:  Negative for chills, fatigue and fever.  HENT: Negative for congestion, ear pain, sinus pain and sore throat.   Eyes: Negative.   Respiratory: +cough, productive. Negative for shortness of breath and wheezing.   Cardiovascular: Negative for chest pain, palpitations and leg swelling.  Gastrointestinal: Negative for abdominal pain, diarrhea, nausea and vomiting.  Genitourinary: Negative for dysuria, frequency and urgency.  Musculoskeletal: Negative for arthralgias and myalgias.  Skin: Negative for color change, pallor and rash.  Neurological: Negative for syncope, light-headedness and headaches.  Psychiatric/Behavioral: The patient is not nervous/anxious.       Objective:   Physical Exam        Assessment & Plan:

## 2018-04-18 NOTE — ED Triage Notes (Signed)
Pt reports being on antibiotics and prednisone for bronchitis, completed tx. Pt has congestion and cough with productive green mucous still. Pt reports dyspnea with exertion and feels like she is going to faint. Denies pain.

## 2018-04-19 DIAGNOSIS — I2510 Atherosclerotic heart disease of native coronary artery without angina pectoris: Secondary | ICD-10-CM | POA: Diagnosis present

## 2018-04-19 DIAGNOSIS — J440 Chronic obstructive pulmonary disease with acute lower respiratory infection: Secondary | ICD-10-CM | POA: Diagnosis present

## 2018-04-19 DIAGNOSIS — Y95 Nosocomial condition: Secondary | ICD-10-CM | POA: Diagnosis present

## 2018-04-19 DIAGNOSIS — Z794 Long term (current) use of insulin: Secondary | ICD-10-CM

## 2018-04-19 DIAGNOSIS — Z992 Dependence on renal dialysis: Secondary | ICD-10-CM | POA: Diagnosis not present

## 2018-04-19 DIAGNOSIS — I890 Lymphedema, not elsewhere classified: Secondary | ICD-10-CM | POA: Diagnosis present

## 2018-04-19 DIAGNOSIS — N186 End stage renal disease: Secondary | ICD-10-CM

## 2018-04-19 DIAGNOSIS — J9601 Acute respiratory failure with hypoxia: Secondary | ICD-10-CM | POA: Diagnosis present

## 2018-04-19 DIAGNOSIS — J189 Pneumonia, unspecified organism: Secondary | ICD-10-CM | POA: Diagnosis present

## 2018-04-19 DIAGNOSIS — I5042 Chronic combined systolic (congestive) and diastolic (congestive) heart failure: Secondary | ICD-10-CM | POA: Diagnosis present

## 2018-04-19 DIAGNOSIS — I447 Left bundle-branch block, unspecified: Secondary | ICD-10-CM | POA: Diagnosis present

## 2018-04-19 DIAGNOSIS — D631 Anemia in chronic kidney disease: Secondary | ICD-10-CM | POA: Diagnosis present

## 2018-04-19 DIAGNOSIS — Z23 Encounter for immunization: Secondary | ICD-10-CM | POA: Diagnosis present

## 2018-04-19 DIAGNOSIS — E785 Hyperlipidemia, unspecified: Secondary | ICD-10-CM | POA: Diagnosis present

## 2018-04-19 DIAGNOSIS — E1122 Type 2 diabetes mellitus with diabetic chronic kidney disease: Secondary | ICD-10-CM | POA: Diagnosis present

## 2018-04-19 DIAGNOSIS — D72829 Elevated white blood cell count, unspecified: Secondary | ICD-10-CM | POA: Diagnosis present

## 2018-04-19 DIAGNOSIS — R7989 Other specified abnormal findings of blood chemistry: Secondary | ICD-10-CM | POA: Diagnosis not present

## 2018-04-19 DIAGNOSIS — Z853 Personal history of malignant neoplasm of breast: Secondary | ICD-10-CM | POA: Diagnosis not present

## 2018-04-19 DIAGNOSIS — N2581 Secondary hyperparathyroidism of renal origin: Secondary | ICD-10-CM | POA: Diagnosis present

## 2018-04-19 DIAGNOSIS — I132 Hypertensive heart and chronic kidney disease with heart failure and with stage 5 chronic kidney disease, or end stage renal disease: Secondary | ICD-10-CM | POA: Diagnosis present

## 2018-04-19 DIAGNOSIS — J1000 Influenza due to other identified influenza virus with unspecified type of pneumonia: Secondary | ICD-10-CM | POA: Diagnosis present

## 2018-04-19 DIAGNOSIS — J449 Chronic obstructive pulmonary disease, unspecified: Secondary | ICD-10-CM | POA: Diagnosis not present

## 2018-04-19 DIAGNOSIS — I5022 Chronic systolic (congestive) heart failure: Secondary | ICD-10-CM

## 2018-04-19 DIAGNOSIS — J441 Chronic obstructive pulmonary disease with (acute) exacerbation: Secondary | ICD-10-CM | POA: Diagnosis present

## 2018-04-19 DIAGNOSIS — Z7952 Long term (current) use of systemic steroids: Secondary | ICD-10-CM | POA: Diagnosis not present

## 2018-04-19 DIAGNOSIS — E8889 Other specified metabolic disorders: Secondary | ICD-10-CM | POA: Diagnosis present

## 2018-04-19 DIAGNOSIS — Z7982 Long term (current) use of aspirin: Secondary | ICD-10-CM | POA: Diagnosis not present

## 2018-04-19 DIAGNOSIS — Z6841 Body Mass Index (BMI) 40.0 and over, adult: Secondary | ICD-10-CM | POA: Diagnosis not present

## 2018-04-19 DIAGNOSIS — E1121 Type 2 diabetes mellitus with diabetic nephropathy: Secondary | ICD-10-CM

## 2018-04-19 DIAGNOSIS — M898X9 Other specified disorders of bone, unspecified site: Secondary | ICD-10-CM | POA: Diagnosis present

## 2018-04-19 LAB — BASIC METABOLIC PANEL
Anion gap: 10 (ref 5–15)
BUN: 51 mg/dL — ABNORMAL HIGH (ref 8–23)
CO2: 28 mmol/L (ref 22–32)
Calcium: 8.4 mg/dL — ABNORMAL LOW (ref 8.9–10.3)
Chloride: 98 mmol/L (ref 98–111)
Creatinine, Ser: 9.03 mg/dL — ABNORMAL HIGH (ref 0.44–1.00)
GFR calc Af Amer: 4 mL/min — ABNORMAL LOW (ref >60)
GFR calc non Af Amer: 4 mL/min — ABNORMAL LOW (ref >60)
Glucose, Bld: 180 mg/dL — ABNORMAL HIGH (ref 70–99)
Potassium: 5 mmol/L (ref 3.5–5.1)
Sodium: 136 mmol/L (ref 135–145)

## 2018-04-19 LAB — CBG MONITORING, ED
Glucose-Capillary: 111 mg/dL — ABNORMAL HIGH (ref 70–99)
Glucose-Capillary: 186 mg/dL — ABNORMAL HIGH (ref 70–99)
Glucose-Capillary: 275 mg/dL — ABNORMAL HIGH (ref 70–99)
Glucose-Capillary: 298 mg/dL — ABNORMAL HIGH (ref 70–99)

## 2018-04-19 LAB — INFLUENZA PANEL BY PCR (TYPE A & B)
Influenza A By PCR: NEGATIVE
Influenza B By PCR: POSITIVE — AB

## 2018-04-19 LAB — I-STAT TROPONIN, ED: Troponin i, poc: 0.1 ng/mL (ref 0.00–0.08)

## 2018-04-19 LAB — TROPONIN I: Troponin I: 0.08 ng/mL (ref <0.03)

## 2018-04-19 MED ORDER — BENZONATATE 100 MG PO CAPS
200.0000 mg | ORAL_CAPSULE | Freq: Three times a day (TID) | ORAL | Status: DC | PRN
  Administered 2018-04-19: 200 mg via ORAL
  Filled 2018-04-19 (×2): qty 2

## 2018-04-19 MED ORDER — LORATADINE 10 MG PO TABS
10.0000 mg | ORAL_TABLET | Freq: Every day | ORAL | Status: DC
  Filled 2018-04-19: qty 1

## 2018-04-19 MED ORDER — VANCOMYCIN HCL 10 G IV SOLR
2000.0000 mg | Freq: Once | INTRAVENOUS | Status: AC
  Administered 2018-04-19: 2000 mg via INTRAVENOUS
  Filled 2018-04-19: qty 2000

## 2018-04-19 MED ORDER — ACETAMINOPHEN 325 MG PO TABS: 650.0000 mg | ORAL_TABLET | Freq: Four times a day (QID) | ORAL | Status: DC | PRN

## 2018-04-19 MED ORDER — MECLIZINE HCL 25 MG PO TABS: 25.0000 mg | ORAL_TABLET | Freq: Three times a day (TID) | ORAL | Status: DC | PRN

## 2018-04-19 MED ORDER — SODIUM CHLORIDE 0.9 % IV SOLN
2.0000 g | Freq: Once | INTRAVENOUS | Status: AC
  Administered 2018-04-19: 2 g via INTRAVENOUS
  Filled 2018-04-19: qty 2

## 2018-04-19 MED ORDER — LORATADINE 10 MG PO TABS
10.0000 mg | ORAL_TABLET | Freq: Every day | ORAL | Status: DC
  Administered 2018-04-19 – 2018-04-21 (×3): 10 mg via ORAL
  Filled 2018-04-19 (×3): qty 1

## 2018-04-19 MED ORDER — CALCIUM CARBONATE ANTACID 500 MG PO CHEW
2000.0000 mg | CHEWABLE_TABLET | Freq: Three times a day (TID) | ORAL | Status: DC
  Administered 2018-04-19 – 2018-04-20 (×4): 2000 mg via ORAL
  Filled 2018-04-19 (×4): qty 4
  Filled 2018-04-19: qty 10

## 2018-04-19 MED ORDER — ONDANSETRON HCL 4 MG PO TABS: 4.0000 mg | ORAL_TABLET | Freq: Four times a day (QID) | ORAL | Status: DC | PRN

## 2018-04-19 MED ORDER — IPRATROPIUM-ALBUTEROL 0.5-2.5 (3) MG/3ML IN SOLN: 3.0000 mL | Freq: Four times a day (QID) | RESPIRATORY_TRACT | Status: DC

## 2018-04-19 MED ORDER — GABAPENTIN 300 MG PO CAPS
300.0000 mg | ORAL_CAPSULE | ORAL | Status: DC
  Administered 2018-04-20: 300 mg via ORAL
  Filled 2018-04-19: qty 1

## 2018-04-19 MED ORDER — CALCITRIOL 0.5 MCG PO CAPS: 0.7500 ug | ORAL_CAPSULE | ORAL | Status: DC

## 2018-04-19 MED ORDER — PANTOPRAZOLE SODIUM 40 MG PO TBEC
40.0000 mg | DELAYED_RELEASE_TABLET | ORAL | Status: DC
  Administered 2018-04-20: 40 mg via ORAL
  Filled 2018-04-19: qty 1

## 2018-04-19 MED ORDER — IPRATROPIUM-ALBUTEROL 0.5-2.5 (3) MG/3ML IN SOLN
3.0000 mL | Freq: Once | RESPIRATORY_TRACT | Status: AC
  Administered 2018-04-19: 3 mL via RESPIRATORY_TRACT
  Filled 2018-04-19: qty 3

## 2018-04-19 MED ORDER — ALTEPLASE 2 MG IJ SOLR: 2.0000 mg | Freq: Once | INTRAMUSCULAR | Status: DC | PRN

## 2018-04-19 MED ORDER — METHYLPREDNISOLONE SODIUM SUCC 125 MG IJ SOLR
125.0000 mg | Freq: Once | INTRAMUSCULAR | Status: AC
  Administered 2018-04-19: 125 mg via INTRAVENOUS
  Filled 2018-04-19: qty 2

## 2018-04-19 MED ORDER — SODIUM CHLORIDE 0.9 % IV SOLN: 100.0000 mL | INTRAVENOUS | Status: DC | PRN

## 2018-04-19 MED ORDER — IPRATROPIUM-ALBUTEROL 0.5-2.5 (3) MG/3ML IN SOLN: 3.0000 mL | RESPIRATORY_TRACT | Status: DC | PRN

## 2018-04-19 MED ORDER — CHLORHEXIDINE GLUCONATE CLOTH 2 % EX PADS: 6.0000 | MEDICATED_PAD | Freq: Every day | CUTANEOUS | Status: DC

## 2018-04-19 MED ORDER — GUAIFENESIN ER 600 MG PO TB12
600.0000 mg | ORAL_TABLET | Freq: Two times a day (BID) | ORAL | Status: DC
  Administered 2018-04-19 – 2018-04-21 (×4): 600 mg via ORAL
  Filled 2018-04-19 (×4): qty 1

## 2018-04-19 MED ORDER — SODIUM CHLORIDE 0.9% FLUSH
3.0000 mL | Freq: Two times a day (BID) | INTRAVENOUS | Status: DC
  Administered 2018-04-19 – 2018-04-20 (×4): 3 mL via INTRAVENOUS

## 2018-04-19 MED ORDER — CARVEDILOL 6.25 MG PO TABS
6.2500 mg | ORAL_TABLET | ORAL | Status: DC
  Administered 2018-04-21: 6.25 mg via ORAL
  Filled 2018-04-19 (×3): qty 1

## 2018-04-19 MED ORDER — ACETAMINOPHEN 650 MG RE SUPP: 650.0000 mg | Freq: Four times a day (QID) | RECTAL | Status: DC | PRN

## 2018-04-19 MED ORDER — OXYMETAZOLINE HCL 0.05 % NA SOLN
1.0000 | Freq: Two times a day (BID) | NASAL | Status: DC
  Filled 2018-04-19: qty 15

## 2018-04-19 MED ORDER — IPRATROPIUM-ALBUTEROL 0.5-2.5 (3) MG/3ML IN SOLN
3.0000 mL | Freq: Four times a day (QID) | RESPIRATORY_TRACT | Status: DC
  Administered 2018-04-19 – 2018-04-21 (×8): 3 mL via RESPIRATORY_TRACT
  Filled 2018-04-19 (×9): qty 3

## 2018-04-19 MED ORDER — INSULIN DETEMIR 100 UNIT/ML ~~LOC~~ SOLN
55.0000 [IU] | Freq: Every evening | SUBCUTANEOUS | Status: DC
  Administered 2018-04-19: 55 [IU] via SUBCUTANEOUS
  Filled 2018-04-19 (×2): qty 0.55

## 2018-04-19 MED ORDER — MIDODRINE HCL 5 MG PO TABS
5.0000 mg | ORAL_TABLET | Freq: Every day | ORAL | Status: DC | PRN
  Administered 2018-04-20: 5 mg via ORAL
  Filled 2018-04-19 (×2): qty 1

## 2018-04-19 MED ORDER — HEPARIN SODIUM (PORCINE) 1000 UNIT/ML DIALYSIS
5000.0000 [IU] | Freq: Once | INTRAMUSCULAR | Status: AC
  Administered 2018-04-20: 5000 [IU] via INTRAVENOUS_CENTRAL
  Filled 2018-04-19: qty 5

## 2018-04-19 MED ORDER — SODIUM CHLORIDE 0.9 % IV SOLN
1.0000 g | INTRAVENOUS | Status: DC
  Administered 2018-04-19: 1 g via INTRAVENOUS
  Filled 2018-04-19: qty 1

## 2018-04-19 MED ORDER — ASPIRIN EC 81 MG PO TBEC
81.0000 mg | DELAYED_RELEASE_TABLET | ORAL | Status: DC
  Administered 2018-04-20: 81 mg via ORAL
  Filled 2018-04-19: qty 1

## 2018-04-19 MED ORDER — ATORVASTATIN CALCIUM 40 MG PO TABS
40.0000 mg | ORAL_TABLET | Freq: Every day | ORAL | Status: DC
  Administered 2018-04-19 – 2018-04-20 (×2): 40 mg via ORAL
  Filled 2018-04-19 (×2): qty 1

## 2018-04-19 MED ORDER — VANCOMYCIN HCL IN DEXTROSE 1-5 GM/200ML-% IV SOLN
1000.0000 mg | INTRAVENOUS | Status: DC
  Administered 2018-04-20: 1000 mg via INTRAVENOUS

## 2018-04-19 MED ORDER — GUAIFENESIN ER 600 MG PO TB12
600.0000 mg | ORAL_TABLET | Freq: Two times a day (BID) | ORAL | Status: DC
  Filled 2018-04-19: qty 1

## 2018-04-19 MED ORDER — PNEUMOCOCCAL VAC POLYVALENT 25 MCG/0.5ML IJ INJ
0.5000 mL | INJECTION | INTRAMUSCULAR | Status: AC | PRN
  Administered 2018-04-21: 0.5 mL via INTRAMUSCULAR
  Filled 2018-04-19: qty 0.5

## 2018-04-19 MED ORDER — INSULIN ASPART 100 UNIT/ML ~~LOC~~ SOLN
0.0000 [IU] | Freq: Three times a day (TID) | SUBCUTANEOUS | Status: DC
  Administered 2018-04-19: 5 [IU] via SUBCUTANEOUS
  Administered 2018-04-20 (×2): 3 [IU] via SUBCUTANEOUS
  Administered 2018-04-20: 1 [IU] via SUBCUTANEOUS
  Administered 2018-04-21: 2 [IU] via SUBCUTANEOUS

## 2018-04-19 MED ORDER — PENTAFLUOROPROP-TETRAFLUOROETH EX AERO: 1.0000 "application " | INHALATION_SPRAY | CUTANEOUS | Status: DC | PRN

## 2018-04-19 MED ORDER — ONDANSETRON HCL 4 MG/2ML IJ SOLN: 4.0000 mg | Freq: Four times a day (QID) | INTRAMUSCULAR | Status: DC | PRN

## 2018-04-19 MED ORDER — ALBUTEROL SULFATE (2.5 MG/3ML) 0.083% IN NEBU: 2.5000 mg | INHALATION_SOLUTION | RESPIRATORY_TRACT | Status: DC | PRN

## 2018-04-19 MED ORDER — LIDOCAINE HCL (PF) 1 % IJ SOLN: 5.0000 mL | INTRAMUSCULAR | Status: DC | PRN

## 2018-04-19 MED ORDER — FLUTICASONE PROPIONATE 50 MCG/ACT NA SUSP
2.0000 | Freq: Every day | NASAL | Status: DC
  Administered 2018-04-19 – 2018-04-21 (×3): 2 via NASAL
  Filled 2018-04-19: qty 16

## 2018-04-19 MED ORDER — LIDOCAINE-PRILOCAINE 2.5-2.5 % EX CREA
1.0000 "application " | TOPICAL_CREAM | CUTANEOUS | Status: DC | PRN
  Filled 2018-04-19: qty 5

## 2018-04-19 MED ORDER — HEPARIN SODIUM (PORCINE) 5000 UNIT/ML IJ SOLN
5000.0000 [IU] | Freq: Three times a day (TID) | INTRAMUSCULAR | Status: DC
  Administered 2018-04-19 – 2018-04-21 (×6): 5000 [IU] via SUBCUTANEOUS
  Filled 2018-04-19 (×6): qty 1

## 2018-04-19 MED ORDER — METHYLPREDNISOLONE SODIUM SUCC 125 MG IJ SOLR
60.0000 mg | Freq: Three times a day (TID) | INTRAMUSCULAR | Status: DC
  Administered 2018-04-19 – 2018-04-21 (×6): 60 mg via INTRAVENOUS
  Filled 2018-04-19 (×6): qty 2

## 2018-04-19 MED ORDER — OXYMETAZOLINE HCL 0.05 % NA SOLN
1.0000 | Freq: Two times a day (BID) | NASAL | Status: DC
  Administered 2018-04-19 – 2018-04-21 (×4): 1 via NASAL
  Filled 2018-04-19: qty 15

## 2018-04-19 MED ORDER — HEPARIN SODIUM (PORCINE) 1000 UNIT/ML DIALYSIS
1000.0000 [IU] | INTRAMUSCULAR | Status: DC | PRN
  Filled 2018-04-19: qty 1

## 2018-04-19 NOTE — ED Notes (Signed)
Ordered diet tray for pt  

## 2018-04-19 NOTE — ED Notes (Signed)
Only able to obtain 1 set of blood cultures due to difficult stick

## 2018-04-19 NOTE — ED Notes (Signed)
Called service response.  They said they did not have a record of [place] ordering breakfast at 1100.  Notified them that this was 2nd meal that had not come up for pt.

## 2018-04-19 NOTE — Consult Note (Signed)
[facility] Renal Consultation Note    Indication for Consultation:  Management of ESRD/hemodialysis; anemia, hypertension/volume and secondary hyperparathyroidism  HPI: Jordan [name] is a 78 y.o. 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   Admitted for treatment of PNA. Presented to [facility] ED with worsening cough/dsypnea x 1. Had been treated as outpatient for PNA, says she completed a course of antibiotics and steroids within the last 2 weeks. Reports chronic cough since November, but worsening over this week. Hypoxic on arrival O2 sats 88%   CXR showing bilateral lung opacities. Labs significant for K 5.0, BUN 51, Cr 9.03, WBC 12.2, Hgb 9.7  Blood cultures ordered and empiric antibiotics started.  Seen and examined in ED. O2 sats 100% on  2L [place]. Endorses productive, cough, congestion, DOE. Denies fevers, chills, N/V/abd pain.   Dialyzes via R arm AVG. Last HD Thursday 1/2. Completed full treatment and left 1.3kg over EDW. No recent issues on HD.    Past Medical History:  Diagnosis Date  . Arthritis   . Breast cancer ([place])   . CAD (coronary artery disease)    a. minimal CAD by cath in 10/2014.  Marland Kitchen CKD (chronic kidney disease)   . COPD (chronic obstructive pulmonary disease) ([place])   . Diabetes mellitus with ESRD (end-stage renal disease) ([place])   . Enterococcal bacteremia 12/26/2014  . GERD (gastroesophageal reflux disease)   . Hypertension   . Renal disorder    Family reports acute renal failure  . Sleep apnea    does not wear CPAP  . Systolic CHF, chronic ([place]) 12/23/2014   Past Surgical History:  Procedure Laterality Date  . ARTERIOVENOUS GRAFT PLACEMENT Right 10/05/15  . AV FISTULA PLACEMENT Right 10/26/2014   Procedure: Right Brachiocephalic Arteriovenous FISTULA CREATION;  Surgeon: Conrad Waynesville, MD;  Location: [place];  Service: Vascular;   Laterality: Right;  . AV FISTULA PLACEMENT Right 10/05/2015   Procedure: INSERTION OF  ARTERIOVENOUS (AV) GORE-TEX GRAFT RIGHT ARM;  Surgeon: Conrad [place] River, MD;  Location: [place];  Service: Vascular;  Laterality: Right;  . BASCILIC VEIN TRANSPOSITION Right 04/13/2015   Procedure: RIGHT FIRST STAGE BASCILIC VEIN TRANSPOSITION;  Surgeon: Conrad Woodland, MD;  Location: [place];  Service: Vascular;  Laterality: Right;  . BASCILIC VEIN TRANSPOSITION Right 07/11/2015   Procedure: SECOND STAGE BASILIC VEIN TRANSPOSITION;  Surgeon: Conrad Fieldale, MD;  Location: [place];  Service: Vascular;  Laterality: Right;  . BREAST SURGERY Left   . CARDIAC CATHETERIZATION [name] 10/19/2014   Procedure: Right/Left Heart Cath and Coronary Angiography;  Surgeon: Peter M Martinique, MD;  Location: [address] CV LAB;  Service: Cardiovascular;  Laterality: [name];  . FISTULA SUPERFICIALIZATION Right 01/26/2015   Procedure: Right BRACHIOCEPHALIC ARTERIOVENOUS FISTULA SUPERFICIALIZATION with side branch ligation;  Surgeon: Conrad Riva, MD;  Location: [place];  Service: Vascular;  Laterality: Right;  . IR DIALY SHUNT INTRO [place] W/IMG RIGHT Right 08/23/2016  . IR GENERIC HISTORICAL  04/30/2016   IR REMOVAL TUN CV CATH W/O FL 04/30/2016 Aletta Edouard, MD [facility]-INTERV RAD  . IR US GUIDE VASC ACCESS RIGHT  08/23/2016  . LIGATION OF ARTERIOVENOUS  FISTULA Right 03/07/2015   Procedure: LIGATION OF RIGHT BRACHIOCEPHALIC ARTERIOVENOUS  FISTULA;  Surgeon: Conrad Youngsville, MD;  Location: [place];  Service: Vascular;  Laterality: Right;  . LIGATION OF ARTERIOVENOUS  FISTULA Right  10/05/2015   Procedure: LIGATION OF RIGHT BASILIC VEIN TRANSPOSITION; EXCISION OF CICATRIX;  Surgeon: Conrad Paxton, MD;  Location: [place];  Service: Vascular;  Laterality: Right;  . PERIPHERAL VASCULAR CATHETERIZATION [name] 01/24/2015   Procedure: Fistulagram;  Surgeon: Conrad Roanoke, MD;  Location: [place] CV LAB;  Service: Cardiovascular;  Laterality: [name];  . PERIPHERAL  VASCULAR CATHETERIZATION [name] 08/29/2015   Procedure: Fistulagram;  Surgeon: Conrad [place], MD;  Location: [place] CV LAB;  Service: Cardiovascular;  Laterality: [name];  . PERIPHERAL VASCULAR CATHETERIZATION Right 08/29/2015   Procedure: Peripheral Vascular Balloon Angioplasty;  Surgeon: Conrad Riverton, MD;  Location: [place] CV LAB;  Service: Cardiovascular;  Laterality: Right;  arm fistula  . TEE WITHOUT CARDIOVERSION [name] 12/27/2014   Procedure: TRANSESOPHAGEAL ECHOCARDIOGRAM (TEE);  Surgeon: Thayer Headings, MD;  Location: [place];  Service: Cardiovascular;  Laterality: [name];  . THROMBECTOMY W/ EMBOLECTOMY Right 03/07/2015   Procedure: THROMBECTOMY OF RIGHT BRACHIOCEPHALIC ARTERIOVENOUS FISTULA;  Surgeon: Conrad Oakbrook, MD;  Location: [place];  Service: Vascular;  Laterality: Right;  . THROMBECTOMY W/ EMBOLECTOMY Right 01/27/2016   Procedure: THROMBECTOMY AND REVISION OF RIGHT UPPER ARM ARTERIOVENOUS GORE-TEX GRAFT;  Surgeon: Angelia Mould, MD;  Location: [facility];  Service: Vascular;  Laterality: Right;   Family History  Problem Relation Age of Onset  . Diabetes Mother   . Hypertension Mother   . Stroke Sister   . Hypertension Sister   . Heart attack Neg Hx    Social History:  reports that she has quit smoking. She has never used smokeless tobacco. She reports that she does not drink alcohol or use drugs. Allergies  Allergen Reactions  . Vicodin [Hydrocodone-Acetaminophen] Itching  . Adhesive [Tape] Itching, Rash and Other (See Comments)    Please use "paper" tape   Prior to Admission medications   Medication Sig Start Date End Date Taking? Authorizing Provider  albuterol (PROVENTIL HFA;VENTOLIN HFA) 108 (90 Base) MCG/ACT inhaler Inhale 2 puffs into the lungs every 6 (six) hours as needed for wheezing or shortness of breath. 04/03/18  Yes Leone Haven, MD  aspirin EC 81 MG tablet Take 81 mg by mouth See admin instructions. Take one tablet (81 mg) by mouth every Monday,  Wednesday, Friday and Sunday morning (non-dialysis days)   Yes [provider]  atorvastatin (LIPITOR) 40 MG tablet Take 1 tablet (40 mg total) by mouth daily at 6 PM. 04/04/18  Yes Leone Haven, MD  Calcium Carbonate Antacid (TUMS ULTRA 1000 PO) Take 1,000 mg by mouth 3 (three) times daily with meals.   Yes [provider]  carvedilol (COREG) 6.25 MG tablet Take 1 tablet (6.25 mg total) by mouth See admin instructions. Take 6.25 mg by mouth twice daily on Monday, Wednesday, Friday and Sunday. Patient taking differently: Take 6.25 mg by mouth See admin instructions. Take one tablet ( 6.25 mg) by mouth twice daily on Monday, Wednesday, Friday and Sunday. (non-dialysis days) 11/09/16  Yes Leone Haven, MD  clotrimazole (LOTRIMIN) 1 % cream Apply 1 application topically 2 (two) times daily as needed (rash under breasts).  12/04/17  Yes [provider]  gabapentin (NEURONTIN) 300 MG capsule Take 1 capsule (300 mg total) by mouth See admin instructions. Take one capsule (300 mg) by mouth every Monday, Wednesday, Friday and Sunday morning (non-dialysis days) 04/11/18  Yes Leone Haven, MD  glucose blood [facility] VERIO) test strip Test once daily 03/26/18  Yes Leone Haven, MD  Insulin Pen Needle (BD PEN NEEDLE NANO U/F) 32G X 4 MM MISC Use as directed 03/27/18  Yes Leone Haven, MD  Lancets [facility] ULTRASOFT) lancets Use to check blood sugars 2-3 times daily as directed. 03/26/18  Yes Leone Haven, MD  LEVEMIR FLEXTOUCH 100 UNIT/ML Pen INJECT 55 UNITS INTO THE SKIN AT BEDTIME. Patient taking differently: Inject 55 Units into the skin every evening.  07/11/17  Yes Leone Haven, MD  meclizine (ANTIVERT) 25 MG tablet Take 1 tablet (25 mg total) by mouth 3 (three) times daily as needed for dizziness. 03/28/18  Yes Leone Haven, MD  midodrine (PROAMATINE) 5 MG tablet Take 5 mg by mouth See admin instructions. Take one tablet (5 mg) by mouth  during dialysis (Tuesday, Thursday, Saturday) as needed for low blood pressure <79/30 06/21/17  Yes [provider]  Multiple Vitamin (MULTIVITAMIN WITH MINERALS) TABS tablet Take 1 tablet by mouth See admin instructions. Take one tablet by mouth every Monday, Wednesday, Friday and Sunday morning (non-dialysis days) - Women's fusion   Yes [provider]  omeprazole (PRILOSEC) 20 MG capsule Take 20 mg by mouth See admin instructions. Take one capsule (20 mg) by mouth on Monday, Wednesday, Friday and Sunday morning (non-dialysis days)   Yes [provider]   Current Facility-Administered Medications  Medication Dose Route Frequency Provider Last Rate Last Dose  . acetaminophen (TYLENOL) tablet 650 mg  650 mg Oral Q6H PRN Norval Morton, MD       Or  . acetaminophen (TYLENOL) suppository 650 mg  650 mg Rectal Q6H PRN Tamala Julian, Rondell A, MD      . albuterol (PROVENTIL) (2.5 MG/3ML) 0.083% nebulizer solution 2.5 mg  2.5 mg Nebulization Q2H PRN Jonetta Osgood, MD      . Derrill Memo ON 04/20/2018] aspirin EC tablet 81 mg  81 mg Oral Q M,W,F,Su-1800 Smith, Rondell A, MD      . atorvastatin (LIPITOR) tablet 40 mg  40 mg Oral q1800 Smith, Rondell A, MD      . benzonatate (TESSALON) capsule 200 mg  200 mg Oral TID PRN Jonetta Osgood, MD      . calcium carbonate (TUMS - dosed in mg elemental calcium) chewable tablet 2,000 mg  2,000 mg Oral TID WC Smith, Rondell A, MD      . Derrill Memo ON 04/20/2018] carvedilol (COREG) tablet 6.25 mg  6.25 mg Oral 2 times per day on Sun Mon Wed Fri Fuller Plan A, MD      . ceFEPIme (MAXIPIME) 1 g in sodium chloride 0.9 % 100 mL IVPB  1 g Intravenous Q24H Smith, Rondell A, MD      . fluticasone (FLONASE) 50 MCG/ACT nasal spray 2 spray  2 spray Each Nare Daily Ghimire, Henreitta Leber, MD      . Derrill Memo ON 04/20/2018] gabapentin (NEURONTIN) capsule 300 mg  300 mg Oral Q M,W,F,Su-1800 Smith, Rondell A, MD      . guaiFENesin (MUCINEX) 12 hr tablet 600 mg  600 mg Oral  BID Smith, Rondell A, MD      . heparin injection 5,000 Units  5,000 Units Subcutaneous Q8H Smith, Rondell A, MD      . insulin aspart (novoLOG) injection 0-9 Units  0-9 Units Subcutaneous TID WC Smith, Rondell A, MD      . insulin detemir (LEVEMIR) injection 55 Units  55 Units Subcutaneous QPM Smith, Rondell A, MD      . ipratropium-albuterol (DUONEB) 0.5-2.5 (3) MG/3ML nebulizer  solution 3 mL  3 mL Nebulization Q6H Ghimire, Henreitta Leber, MD   3 mL at 04/19/18 0810  . loratadine (CLARITIN) tablet 10 mg  10 mg Oral Daily Ghimire, Henreitta Leber, MD      . meclizine (ANTIVERT) tablet 25 mg  25 mg Oral TID PRN Fuller Plan A, MD      . methylPREDNISolone sodium succinate (SOLU-MEDROL) 125 mg/2 mL injection 60 mg  60 mg Intravenous Q8H Ghimire, Shanker M, MD      . midodrine (PROAMATINE) tablet 5 mg  5 mg Oral Daily PRN Fuller Plan A, MD      . ondansetron (ZOFRAN) tablet 4 mg  4 mg Oral Q6H PRN Fuller Plan A, MD       Or  . ondansetron (ZOFRAN) injection 4 mg  4 mg Intravenous Q6H PRN Smith, Rondell A, MD      . oxymetazoline (AFRIN) 0.05 % nasal spray 1 spray  1 spray Each Nare BID Jonetta Osgood, MD      . Derrill Memo ON 04/20/2018] pantoprazole (PROTONIX) EC tablet 40 mg  40 mg Oral Q M,W,F,Su-1800 Smith, Rondell A, MD      . sodium chloride flush (NS) 0.9 % injection 3 mL  3 mL Intravenous Q12H Smith, Rondell A, MD      . vancomycin (VANCOCIN) IVPB 1000 mg/200 mL premix  1,000 mg Intravenous Q T,Th,Sa-HD Erenest Blank, RPH       Current Outpatient Medications  Medication Sig Dispense Refill  . albuterol (PROVENTIL HFA;VENTOLIN HFA) 108 (90 Base) MCG/ACT inhaler Inhale 2 puffs into the lungs every 6 (six) hours as needed for wheezing or shortness of breath. 1 Inhaler 0  . aspirin EC 81 MG tablet Take 81 mg by mouth See admin instructions. Take one tablet (81 mg) by mouth every Monday, Wednesday, Friday and Sunday morning (non-dialysis days)    . atorvastatin (LIPITOR) 40 MG tablet Take 1 tablet  (40 mg total) by mouth daily at 6 PM. 30 tablet 1  . Calcium Carbonate Antacid (TUMS ULTRA 1000 PO) Take 1,000 mg by mouth 3 (three) times daily with meals.    . carvedilol (COREG) 6.25 MG tablet Take 1 tablet (6.25 mg total) by mouth See admin instructions. Take 6.25 mg by mouth twice daily on Monday, Wednesday, Friday and Sunday. (Patient taking differently: Take 6.25 mg by mouth See admin instructions. Take one tablet ( 6.25 mg) by mouth twice daily on Monday, Wednesday, Friday and Sunday. (non-dialysis days)) 60 tablet 3  . clotrimazole (LOTRIMIN) 1 % cream Apply 1 application topically 2 (two) times daily as needed (rash under breasts).   2  . gabapentin (NEURONTIN) 300 MG capsule Take 1 capsule (300 mg total) by mouth See admin instructions. Take one capsule (300 mg) by mouth every Monday, Wednesday, Friday and Sunday morning (non-dialysis days) 48 capsule 1  . glucose blood (ONETOUCH VERIO) test strip Test once daily 100 each 12  . Insulin Pen Needle (BD PEN NEEDLE NANO U/F) 32G X 4 MM MISC Use as directed 100 each 5  . Lancets (ONETOUCH ULTRASOFT) lancets Use to check blood sugars 2-3 times daily as directed. 100 each 12  . LEVEMIR FLEXTOUCH 100 UNIT/ML Pen INJECT 55 UNITS INTO THE SKIN AT BEDTIME. (Patient taking differently: Inject 55 Units into the skin every evening. ) 15 pen 2  . meclizine (ANTIVERT) 25 MG tablet Take 1 tablet (25 mg total) by mouth 3 (three) times daily as needed for dizziness. 30 tablet 0  .  midodrine (PROAMATINE) 5 MG tablet Take 5 mg by mouth See admin instructions. Take one tablet (5 mg) by mouth during dialysis (Tuesday, Thursday, Saturday) as needed for low blood pressure <79/30  11  . Multiple Vitamin (MULTIVITAMIN WITH MINERALS) TABS tablet Take 1 tablet by mouth See admin instructions. Take one tablet by mouth every Monday, Wednesday, Friday and Sunday morning (non-dialysis days) - Women's fusion    . omeprazole (PRILOSEC) 20 MG capsule Take 20 mg by mouth See  admin instructions. Take one capsule (20 mg) by mouth on Monday, Wednesday, Friday and Sunday morning (non-dialysis days)      ROS: As per HPI otherwise negative.  Physical Exam: Vitals:   04/19/18 0058 04/19/18 0345 04/19/18 0400 04/19/18 0415  BP: (!) 138/57 (!) 139/92 (!) 117/103 113/76  Pulse: 72 71 63 66  Resp: 20 17 15 14   Temp: 98.1 F (36.7 C)     TempSrc: Oral     SpO2: 100% 94% 100% 100%  Weight:      Height:         General: Obese female on nasal oxygen NAD  Head: NCAT sclera not icteric MMM Neck: Supple. No JVD  Lungs: Loud upper airway noise heard throughout; scattered wheeze Heart: Distant RRR  Abdomen: obese soft NT + BS Lower extremities: Trace LE edema No wounds  Neuro: A & O  X 3. Moves all extremities spontaneously. Psych:  Responds to questions appropriately with a normal affect. Dialysis Access: RUA AVG +bruit   Labs: Basic Metabolic Panel: Recent Labs  Lab 04/18/18 1624 04/19/18 0346  NA 139 136  K 5.9* 5.0  CL 98 98  CO2 30 28  GLUCOSE 185* 180*  BUN 44* 51*  CREATININE 8.32* 9.03*  CALCIUM 8.6* 8.4*   Liver Function Tests: No results for input(s): AST, ALT, ALKPHOS, BILITOT, PROT, ALBUMIN in the last 168 hours. No results for input(s): LIPASE, AMYLASE in the last 168 hours. No results for input(s): AMMONIA in the last 168 hours. CBC: Recent Labs  Lab 04/18/18 1624  WBC 12.2*  HGB 9.7*  HCT 34.0*  MCV 94.2  PLT 162   Cardiac Enzymes: Recent Labs  Lab 04/19/18 0853  TROPONINI 0.08*   CBG: Recent Labs  Lab 04/19/18 0804  GLUCAP 111*   Iron Studies: No results for input(s): IRON, TIBC, TRANSFERRIN, FERRITIN in the last 72 hours. Studies/Results: Dg Chest 2 View  Result Date: 04/18/2018 CLINICAL DATA:  Productive cough, congestion, shortness of breath. EXAM: CHEST - 2 VIEW COMPARISON:  04/02/2018 FINDINGS: The cardiac silhouette remains enlarged. Aortic atherosclerosis is noted. There is new abnormal airspace opacity  laterally in the left mid lung, and there is milder patchy and streaky opacity in the left lung base. New mild right basilar opacity is also present. No pleural effusion or pneumothorax is identified. No acute osseous abnormality is seen. A vascular stent is noted in the right axillary region. IMPRESSION: Worsening left greater than right lung opacities concerning for pneumonia. Electronically Signed   By: Logan Bores M.D.   On: 04/18/2018 17:13    Dialysis Orders:  [facility]  4h 180NRe 400/1.5x EDW 121kg 2K/2.5Ca Prof 2 R AVG Hep 8000 U Mircera 50 mcg IV q 2 weeks (last 12/26) Calcitriol 0.75 mcg PO TIW   Assessment/Plan: 1.  HCAP - CXR with bilat lung opacities. On Cefepime/Vancomycin. Blood cultures pending. Per primary  2.  ESRD -  HD TTS. HD today on schedule. K 5.0   3.  Hypertension/volume  -  BP controlled.Takes midodrine pre HD Attempt 3L UF with HD today  4.  Anemia  - Hgb 9.7 On ESA as outpatient. Next dose due 1/9  5.  Metabolic bone disease -  Ca ok. Continue calcitriol. Tums binder TIDM 6.  DM type 2 -insulin per primary   Lynnda Child PA-C [place] Pager [phone] 04/19/2018, 11:40 AM

## 2018-04-19 NOTE — Progress Notes (Signed)
Pharmacy Antibiotic Note  Jordan [name] is a 78 y.o. female admitted on 04/18/2018 with pneumonia.  Pharmacy has been consulted for Vancomycin dosing. WBC 12.2. ESRD on HD TTS.   Plan: Vancomycin 2000 mg IV x 1, then 1000 mg IV qHD TTS Cefepime per MD (adjusted for HD) Trend WBC, temp, HD schedule F/U infectious work-up Drug levels as indicated   Height: 5\' 5"  (165.1 cm) Weight: 274 lb (124.3 kg) IBW/kg (Calculated) : 57  Temp (24hrs), Avg:98 F (36.7 C), Min:98 F (36.7 C), Max:98.1 F (36.7 C)  Recent Labs  Lab 04/18/18 1624 04/19/18 0346  WBC 12.2*  --   CREATININE 8.32* 9.03*    Estimated Creatinine Clearance: 7.1 mL/min (A) (by C-G formula based on SCr of 9.03 mg/dL (H)).    Allergies  Allergen Reactions  . Vicodin [Hydrocodone-Acetaminophen] Itching  . Adhesive [Tape] Itching, Rash and Other (See Comments)    Please use "paper" tape    Narda Bonds 04/19/2018 5:27 AM

## 2018-04-19 NOTE — ED Notes (Signed)
Pt sitting up on side of bed.  States prefers sitting up in chair.  Given armchair.  Tolerating better.

## 2018-04-19 NOTE — H&P (Signed)
History and Physical    [name] NTI:[id number] DOB: 1940/09/29 DOA: 04/18/2018  Referring MD/NP/PA: Joline Maxcy, PA-C PCP: Leone Haven, MD  Patient coming from: Home  Chief Complaint: Cough  I have personally briefly reviewed patient's old medical records in [place]   HPI: Jordan [name] is a 78 y.o. female with medical history significant of, ESRD on HD (TTS),  CAD, COPD not on oxygen, type 2 diabetes, hypertension, chronic diastolic congestive heart failure; who presents with complaints of cough and shortness of breath.  Patient reports symptoms initially started in November.  She had been seen by her primary care provider and previously treated with doxycycline and prednisone taper which she recently completed last week.  She reports symptoms had initially gotten better, but over the last week had returned.  He notes having a productive cough with wheezing.  Patient was also recently given albuterol inhaler which she has been taking twice daily without significant improvement in symptoms.  She has been going to hemodialysis as scheduled and notes that her weight is stable.  Patient did receive her influenza vaccine and reports no other family members sick in the household.  Denies having any significant fevers, headaches, chest pain, vomiting, or diarrhea symptoms.  Patient does admit to being choked up with eating and feeling as though foods may be going down the wrong pipe, and this happens often.  She is never had a swallow evaluation previously.  ED Course: Upon admission into the emergency department patient was seen to be afebrile, blood pressure 117/103-148/52, O2 saturations 88% on room air, and up to 100% on 2 L nasal cannula oxygen.  Labs revealed WBC 12.2, hemoglobin 9.7, platelets 162, BUN 59, creatinine 9.03, troponin 0.1 initial potassium thought to be hemolyzed at 5.9, and repeat potassium 5.  Patient was given a DuoNeb breathing treatment,  vancomycin, and cefepime.  TRH called to admit for presumed HCAP.  Review of Systems  Respiratory: Positive for cough, sputum production, shortness of breath and wheezing.   Cardiovascular: Negative for chest pain.    Past Medical History:  Diagnosis Date  . Arthritis   . Breast cancer ([place])   . CAD (coronary artery disease)    a. minimal CAD by cath in 10/2014.  Marland Kitchen CKD (chronic kidney disease)   . COPD (chronic obstructive pulmonary disease) ([place])   . Diabetes mellitus with ESRD (end-stage renal disease) ([place])   . Enterococcal bacteremia 12/26/2014  . GERD (gastroesophageal reflux disease)   . Hypertension   . Renal disorder    Family reports acute renal failure  . Sleep apnea    does not wear CPAP  . Systolic CHF, chronic ([place]) 12/23/2014    Past Surgical History:  Procedure Laterality Date  . ARTERIOVENOUS GRAFT PLACEMENT Right 10/05/15  . AV FISTULA PLACEMENT Right 10/26/2014   Procedure: Right Brachiocephalic Arteriovenous FISTULA CREATION;  Surgeon: Conrad Kualapuu, MD;  Location: [facility];  Service: Vascular;  Laterality: Right;  . AV FISTULA PLACEMENT Right 10/05/2015   Procedure: INSERTION OF  ARTERIOVENOUS (AV) GORE-TEX GRAFT RIGHT ARM;  Surgeon: Conrad Mellen, MD;  Location: [facility];  Service: Vascular;  Laterality: Right;  . BASCILIC VEIN TRANSPOSITION Right 04/13/2015   Procedure: RIGHT FIRST STAGE BASCILIC VEIN TRANSPOSITION;  Surgeon: Conrad Suarez, MD;  Location: [facility];  Service: Vascular;  Laterality: Right;  . BASCILIC VEIN TRANSPOSITION Right 07/11/2015   Procedure: SECOND STAGE BASILIC VEIN TRANSPOSITION;  Surgeon: Conrad , MD;  [facility];  Service: Vascular;  Laterality: Right;  . BREAST SURGERY Left   . CARDIAC CATHETERIZATION [name] 10/19/2014   Procedure: Right/Left Heart Cath and Coronary Angiography;  Surgeon: Peter M Martinique, MD;  Location: [facility];  Service: Cardiovascular;  Laterality: [name];  . FISTULA SUPERFICIALIZATION Right 01/26/2015   Procedure:  Right BRACHIOCEPHALIC ARTERIOVENOUS FISTULA SUPERFICIALIZATION with side branch ligation;  Surgeon: Conrad [place], MD;  Location: [facility];  Service: Vascular;  Laterality: Right;  . IR DIALY SHUNT INTRO [place] W/IMG RIGHT Right 08/23/2016  . IR GENERIC HISTORICAL  04/30/2016   IR REMOVAL TUN CV CATH W/O FL 04/30/2016 Aletta Edouard, MD [facility]  . IR US GUIDE VASC ACCESS RIGHT  08/23/2016  . LIGATION OF ARTERIOVENOUS  FISTULA Right 03/07/2015   Procedure: LIGATION OF RIGHT BRACHIOCEPHALIC ARTERIOVENOUS  FISTULA;  Surgeon: Conrad Eden, MD;  Location: [facility];  Service: Vascular;  Laterality: Right;  . LIGATION OF ARTERIOVENOUS  FISTULA Right 10/05/2015   Procedure: LIGATION OF RIGHT BASILIC VEIN TRANSPOSITION; EXCISION OF CICATRIX;  Surgeon: Conrad Spillville, MD;  Location: [facility];  Service: Vascular;  Laterality: Right;  . PERIPHERAL VASCULAR CATHETERIZATION [name] 01/24/2015   Procedure: Fistulagram;  Surgeon: Conrad [place], MD;  Location: [facility];  Service: Cardiovascular;  Laterality: [name];  . PERIPHERAL VASCULAR CATHETERIZATION [name] 08/29/2015   Procedure: Fistulagram;  Surgeon: Conrad [place], MD;  Location: [facility];  Service: Cardiovascular;  Laterality: [name];  . PERIPHERAL VASCULAR CATHETERIZATION Right 08/29/2015   Procedure: Peripheral Vascular Balloon Angioplasty;  Surgeon: Conrad [place], MD;  Location: [facility];  Service: Cardiovascular;  Laterality: Right;  arm fistula  . TEE WITHOUT CARDIOVERSION [name] 12/27/2014   Procedure: TRANSESOPHAGEAL ECHOCARDIOGRAM (TEE);  Surgeon: Thayer Headings, MD;  Location: [facility];  Service: Cardiovascular;  Laterality: [name];  . THROMBECTOMY W/ EMBOLECTOMY Right 03/07/2015   Procedure: THROMBECTOMY OF RIGHT BRACHIOCEPHALIC ARTERIOVENOUS FISTULA;  Surgeon: Conrad Donahue, MD;  Location: [facility];  Service: Vascular;  Laterality: Right;  . THROMBECTOMY W/ EMBOLECTOMY Right 01/27/2016   Procedure: THROMBECTOMY AND REVISION OF  RIGHT UPPER ARM ARTERIOVENOUS GORE-TEX GRAFT;  Surgeon: Angelia Mould, MD;  Location: [facility];  Service: Vascular;  Laterality: Right;     reports that she has quit smoking. She has never used smokeless tobacco. She reports that she does not drink alcohol or use drugs.  Allergies  Allergen Reactions  . Vicodin [Hydrocodone-Acetaminophen] Itching  . Adhesive [Tape] Itching, Rash and Other (See Comments)    Please use "paper" tape    Family History  Problem Relation Age of Onset  . Diabetes Mother   . Hypertension Mother   . Stroke Sister   . Hypertension Sister   . Heart attack Neg Hx     Prior to Admission medications   Medication Sig Start Date End Date Taking? Authorizing Provider  albuterol (PROVENTIL HFA;VENTOLIN HFA) 108 (90 Base) MCG/ACT inhaler Inhale 2 puffs into the lungs every 6 (six) hours as needed for wheezing or shortness of breath. 04/03/18   Leone Haven, MD  aspirin EC 81 MG tablet Take 81 mg by mouth See admin instructions. Take one tablet (81 mg) by mouth every Monday, Wednesday, Friday and Sunday morning (non-dialysis days)    [provider]  atorvastatin (LIPITOR) 40 MG tablet Take 1 tablet (40 mg total) by mouth daily at 6 PM. 04/04/18   Leone Haven, MD  Calcium Carbonate Antacid (TUMS  ULTRA 1000 PO) Take 1,000 mg by mouth 3 (three) times daily with meals.    [provider]  carvedilol (COREG) 6.25 MG tablet Take 1 tablet (6.25 mg total) by mouth See admin instructions. Take 6.25 mg by mouth twice daily on Monday, Wednesday, Friday and Sunday. Patient taking differently: Take 6.25 mg by mouth See admin instructions. Take one tablet ( 6.25 mg) by mouth twice daily on Monday, Wednesday, Friday and Sunday. (non-dialysis days) 11/09/16   Leone Haven, MD  clotrimazole (LOTRIMIN) 1 % cream Apply 1 application topically 2 (two) times daily as needed (rash under breasts).  12/04/17   [provider]  doxycycline  (VIBRA-TABS) 100 MG tablet Take 1 tablet (100 mg total) by mouth 2 (two) times daily. Patient not taking: Reported on 04/02/2018 03/24/18   Leone Haven, MD  gabapentin (NEURONTIN) 300 MG capsule Take 1 capsule (300 mg total) by mouth See admin instructions. Take one capsule (300 mg) by mouth every Monday, Wednesday, Friday and Sunday morning (non-dialysis days) 04/11/18   Leone Haven, MD  glucose blood [facility] VERIO) test strip Test once daily 03/26/18   Leone Haven, MD  Insulin Pen Needle (BD PEN NEEDLE NANO U/F) 32G X 4 MM MISC Use as directed 03/27/18   Leone Haven, MD  Lancets [facility] ULTRASOFT) lancets Use to check blood sugars 2-3 times daily as directed. 03/26/18   Leone Haven, MD  LEVEMIR FLEXTOUCH 100 UNIT/ML Pen INJECT 55 UNITS INTO THE SKIN AT BEDTIME. Patient taking differently: Inject 55 Units into the skin every evening.  07/11/17   Leone Haven, MD  meclizine (ANTIVERT) 25 MG tablet Take 1 tablet (25 mg total) by mouth 3 (three) times daily as needed for dizziness. 03/28/18   Leone Haven, MD  midodrine (PROAMATINE) 5 MG tablet Take 5 mg by mouth See admin instructions. Take one tablet (5 mg) by mouth during dialysis (Tuesday, Thursday, Saturday) as needed for low blood pressure <79/30 06/21/17   [provider]  Multiple Vitamin (MULTIVITAMIN WITH MINERALS) TABS tablet Take 1 tablet by mouth See admin instructions. Take one tablet by mouth every Monday, Wednesday, Friday and Sunday morning (non-dialysis days) - [facility]    [provider]  omeprazole (PRILOSEC) 20 MG capsule Take 20 mg by mouth See admin instructions. Take one capsule (20 mg) by mouth on Monday, Wednesday, Friday and Sunday morning (non-dialysis days)    [provider]  predniSONE (DELTASONE) 20 MG tablet Take 2 tablets (40 mg total) by mouth daily with breakfast. 04/03/18   Leone Haven, MD    Physical Exam:  Constitutional: Obese  elderly female who appears to be in some respiratory distress Vitals:   04/18/18 1622 04/18/18 2257 04/19/18 0058 04/19/18 0345  BP:  (!) 148/52 (!) 138/57 (!) 139/92  Pulse:  79 72 71  Resp:  18 20 17   Temp:  98 F (36.7 C) 98.1 F (36.7 C)   TempSrc:  Oral Oral   SpO2:  (!) 88% 100% 94%  Weight: 124.3 kg     Height: 5\' 5"  (1.651 m)      Eyes: PERRL, lids and conjunctivae normal ENMT: Mucous membranes are moist. Posterior pharynx clear of any exudate or lesionsNeck: normal, supple, no masses, no thyromegaly Respiratory: Normal respiratory effort but positive expiratory wheezes and rhonchi noted in the lower lung fields. Cardiovascular: Regular rate and rhythm, no murmurs / rubs / gallops.  At least +1 lower extremity edema.  2+ pedal pulses. No carotid bruits.  Abdomen: no tenderness, no masses palpated. No hepatosplenomegaly. Bowel sounds positive.  Musculoskeletal: no clubbing / cyanosis. No joint deformity upper and lower extremities. Good ROM, no contractures. Normal muscle tone.  Skin: no rashes, lesions, ulcers. No induration Neurologic: CN 2-12 grossly intact. Sensation intact, DTR normal. Strength 5/5 in all 4.  Psychiatric: Normal judgment and insight. Alert and oriented x 3. Normal mood.     Labs on Admission: I have personally reviewed following labs and imaging studies  CBC: Recent Labs  Lab 04/18/18 1624  WBC 12.2*  HGB 9.7*  HCT 34.0*  MCV 94.2  PLT 625   Basic Metabolic Panel: Recent Labs  Lab 04/18/18 1624  NA 139  K 5.9*  CL 98  CO2 30  GLUCOSE 185*  BUN 44*  CREATININE 8.32*  CALCIUM 8.6*   GFR: Estimated Creatinine Clearance: 7.7 mL/min (A) (by C-G formula based on SCr of 8.32 mg/dL (H)). Liver Function Tests: No results for input(s): AST, ALT, ALKPHOS, BILITOT, PROT, ALBUMIN in the last 168 hours. No results for input(s): LIPASE, AMYLASE in the last 168 hours. No results for input(s): AMMONIA in the last 168 hours. Coagulation Profile: No  results for input(s): INR, PROTIME in the last 168 hours. Cardiac Enzymes: No results for input(s): CKTOTAL, CKMB, CKMBINDEX, TROPONINI in the last 168 hours. BNP (last 3 results) No results for input(s): PROBNP in the last 8760 hours. HbA1C: No results for input(s): HGBA1C in the last 72 hours. CBG: No results for input(s): GLUCAP in the last 168 hours. Lipid Profile: No results for input(s): CHOL, HDL, LDLCALC, TRIG, CHOLHDL, LDLDIRECT in the last 72 hours. Thyroid Function Tests: No results for input(s): TSH, T4TOTAL, FREET4, T3FREE, THYROIDAB in the last 72 hours. Anemia Panel: No results for input(s): VITAMINB12, FOLATE, FERRITIN, TIBC, IRON, RETICCTPCT in the last 72 hours. Urine analysis:    Component Value Date/Time   COLORURINE AMBER (A) 12/23/2014 0941   APPEARANCEUR TURBID (A) 12/23/2014 0941   LABSPEC 1.019 12/23/2014 0941   PHURINE 5.0 12/23/2014 0941   GLUCOSEU NEGATIVE 12/23/2014 0941   HGBUR SMALL (A) 12/23/2014 0941   BILIRUBINUR SMALL (A) 12/23/2014 0941   KETONESUR 15 (A) 12/23/2014 0941   PROTEINUR 100 (A) 12/23/2014 0941   UROBILINOGEN 0.2 12/23/2014 0941   NITRITE NEGATIVE 12/23/2014 0941   LEUKOCYTESUR LARGE (A) 12/23/2014 0941   Sepsis Labs: No results found for this or any previous visit (from the past 240 hour(s)).   Radiological Exams on Admission: Dg Chest 2 View  Result Date: 04/18/2018 CLINICAL DATA:  Productive cough, congestion, shortness of breath. EXAM: CHEST - 2 VIEW COMPARISON:  04/02/2018 FINDINGS: The cardiac silhouette remains enlarged. Aortic atherosclerosis is noted. There is new abnormal airspace opacity laterally in the left mid lung, and there is milder patchy and streaky opacity in the left lung base. New mild right basilar opacity is also present. No pleural effusion or pneumothorax is identified. No acute osseous abnormality is seen. A vascular stent is noted in the right axillary region. IMPRESSION: Worsening left greater than right  lung opacities concerning for pneumonia. Electronically Signed   By: Logan Bores M.D.   On: 04/18/2018 17:13    EKG: Independently reviewed.  Sinus rhythm at 78 bpm  Assessment/Plan Healthcare associated pneumonia versus aspiration pneumonia: Acute.  Patient presents with progressively worsening cough and wheezing.  Chest x-ray showing worsening left greater than right lung opacities concerning for pneumonia.  She also notes possibly aspirating food  when eating.  Patient had just recently been hospitalized last 3 months.  Patient was started on empiric antibiotics of vancomycin and cefepime. - Admit to a progressive bed - Follow-up blood and sputum cultures - Continue empiric antibiotics of vancomycin and cefepime - Speech therapy consult for question of possible aspiration  Acute respiratory failure with hypoxia COPD exacerbation: Acute.  Patient noted to be acutely wheezing with initial O2 saturations as low as 88% on room air.  Suspect combination of pneumonia and possible fluid overload - Continuous pulse oximetry with nasal cannula oxygen as needed - Give 125 mg of Solu-Medrol x1 dose - DuoNebs 4 times daily and as needed  Leukocytosis: Acute. WBC elevated 12.2 on admission suspect secondary to above. - Continue to monitor  Elevated troponin: Chronic.  In the setting of end-stage renal disease initial troponin noted to be 0.1, but appears previously elevated higher in the past.  Patient not complaining of any chest discomfort EKG without significant ischemic change - Continue to monitor  ESRD on HD: Patient normally dialyzes Tuesday, Thursday, and Saturday.  Labs revealed potassium 5, BUN 51, creatinine 9.03, calcium 8.4 - Will need to consult nephrology in a.m. for hemodialysis   Systolic CHF: Patient does not look significantly fluid overloaded. Patient's echocardiogram in 02/2018 showed EF 50-55%  - strict I&Os and daily weights  Diabetes mellitus type 2: Initial glucose noted  to be 185. - Hypoglycemic protocol - CBGs q. before meals and at bedtime - Continue home Levemir dose  DVT prophylaxis: heaprin  Code Status: full Family Communication: Discussed plan of care with the patient family present at bedside Disposition Plan: Likely discharge home in 2 to 3 days Consults called: None Admission status: Inpatient  Norval Morton MD [facility] Pager [phone]   If 7PM-7AM, please contact night-coverage [email or web address] Password [email or web address]  04/19/2018, 4:10 AM

## 2018-04-19 NOTE — ED Provider Notes (Signed)
[facility] EMERGENCY DEPARTMENT Provider Note   CSN: [id number] Arrival date & time: 04/18/18  1602     History   Chief Complaint Chief Complaint  Patient presents with  . Shortness of Breath    HPI Jordan [name] is a 78 y.o. female history of chronic systolic CHF, ESRD on HD (T/R/S), and COPD who presents to the emergency department with a chief complaint of shortness of breath.  The patient endorses shortness of breath and a productive cough with green and white sputum that is been progressively worsening over the last week.  States that anytime that she stands or has any exertion that she is so winded that she has to sit back down.  She also reports non-radiating central chest pain with exertion that resolves with rest and worsening orthopnea.  States that she has had to sleep sitting up over the last few days.  Reports a history of chronic lymphedema in her bilateral lower extremities, but reports it has been worsing over the last few days.  She reports she has had a cough for several weeks that was improving, but returned last week.  She denies of fever, chills, palpitations, abdominal pain, nausea, vomiting, diarrhea, headache, or body aches.   She does not wear oxygen at home.  Found to be hypoxic at 88% in triage and was placed on 2 L nasal cannula.  She was last dialyzed on 04/17/2017.  No recent missed sessions.  She recently finished a course of prednisone several weeks ago for suspected COPD exacerbation.  The history is provided by the patient. No language interpreter was used.    Past Medical History:  Diagnosis Date  . Arthritis   . Breast cancer ([place])   . CAD (coronary artery disease)    a. minimal CAD by cath in 10/2014.  Marland Kitchen CKD (chronic kidney disease)   . COPD (chronic obstructive pulmonary disease) ([place])   . Diabetes mellitus with ESRD (end-stage renal disease) ([place])   . Enterococcal bacteremia 12/26/2014  . GERD (gastroesophageal  reflux disease)   . Hypertension   . Renal disorder    Family reports acute renal failure  . Sleep apnea    does not wear CPAP  . Systolic CHF, chronic ([place]) 12/23/2014    Patient Active Problem List   Diagnosis Date Noted  . HCAP (healthcare-associated pneumonia) 04/19/2018  . Leukocytosis 04/19/2018  . BPPV (benign paroxysmal positional vertigo) 03/26/2018  . Bronchitis 03/26/2018  . Anemia, chronic disease 03/03/2018  . Diabetic neuropathy ([place]) 03/03/2018  . Sleep apnea   . Renal disorder   . GERD (gastroesophageal reflux disease)   . Diabetes mellitus with ESRD (end-stage renal disease) ([place])   . CKD (chronic kidney disease)   . CAD (coronary artery disease)   . Paresthesia of left arm 11/09/2016  . Chronic thoracic back pain 11/09/2016  . Personal history of breast cancer 08/26/2016  . Atypical chest pain 08/03/2016  . Chronic diastolic CHF (congestive heart failure) ([place]) 05/11/2015  . Loose stools 01/13/2015  . Skin irritation 01/13/2015  . Rib pain on left side 01/13/2015  . Bacteremia   . Systolic CHF, chronic ([place]) 12/23/2014  . Lymphedema of left arm 12/08/2014  . OSA (obstructive sleep apnea) 12/08/2014  . Constipation 12/08/2014  . Nonischemic cardiomyopathy ([place]) 11/10/2014  . Acute on chronic systolic heart failure, NYHA class 3 ([place]) 11/10/2014  . Morbidly obese ([place]) 11/10/2014  . ESRD (end stage renal disease) on dialysis ([place])   . Type  2 diabetes mellitus with diabetic nephropathy ([place])   . Essential hypertension   . Left hip pain   . NSTEMI (non-ST elevated myocardial infarction) ([place]) 10/13/2014  . Pulmonary HTN ([place]) 10/13/2014  . LBBB (left bundle branch block) 10/12/2014  . Elevated troponin 10/12/2014  . Breast cancer ([place])   . Hypertension   . Arthritis   . COPD (chronic obstructive pulmonary disease) ([place])     Past Surgical History:  Procedure Laterality Date  . ARTERIOVENOUS GRAFT PLACEMENT Right 10/05/15  . AV FISTULA PLACEMENT Right  10/26/2014   Procedure: Right Brachiocephalic Arteriovenous FISTULA CREATION;  Surgeon: Conrad Big Flat, MD;  Location: [place] [place] [facility];  Service: Vascular;  Laterality: Right;  . AV FISTULA PLACEMENT Right 10/05/2015   Procedure: INSERTION OF  ARTERIOVENOUS (AV) GORE-TEX GRAFT RIGHT ARM;  Surgeon: Conrad Loretto, MD;  Location: [facility];  Service: Vascular;  Laterality: Right;  . BASCILIC VEIN TRANSPOSITION Right 04/13/2015   Procedure: RIGHT FIRST STAGE BASCILIC VEIN TRANSPOSITION;  Surgeon: Conrad Julesburg, MD;  Location: [place];  Service: Vascular;  Laterality: Right;  . BASCILIC VEIN TRANSPOSITION Right 07/11/2015   Procedure: SECOND STAGE BASILIC VEIN TRANSPOSITION;  Surgeon: Conrad Page, MD;  Location: [facility];  Service: Vascular;  Laterality: Right;  . BREAST SURGERY Left   . CARDIAC CATHETERIZATION [name] 10/19/2014   Procedure: Right/Left Heart Cath and Coronary Angiography;  Surgeon: Peter M Martinique, MD;  Location: [facility];  Service: Cardiovascular;  Laterality: [name];  . FISTULA SUPERFICIALIZATION Right 01/26/2015   Procedure: Right BRACHIOCEPHALIC ARTERIOVENOUS FISTULA SUPERFICIALIZATION with side branch ligation;  Surgeon: Conrad Evergreen, MD;  Location: [facility];  Service: Vascular;  Laterality: Right;  . IR DIALY SHUNT INTRO [place] W/IMG RIGHT Right 08/23/2016  . IR GENERIC HISTORICAL  04/30/2016   IR REMOVAL TUN CV CATH W/O FL 04/30/2016 Aletta Edouard, MD [facility]-INTER[facility]  . IR US GUIDE VASC ACCESS RIGHT  08/23/2016  . LIGATION OF ARTERIOVENOUS  FISTULA Right 03/07/2015   Procedure: LIGATION OF RIGHT BRACHIOCEPHALIC ARTERIOVENOUS  FISTULA;  Surgeon: Conrad West Samoset, MD;  Location: [facility];  Service: Vascular;  Laterality: Right;  . LIGATION OF ARTERIOVENOUS  FISTULA Right 10/05/2015   Procedure: LIGATION OF RIGHT BASILIC VEIN TRANSPOSITION; EXCISION OF CICATRIX;  Surgeon: Conrad Murray, MD;  Location: [facility];  Service: Vascular;  Laterality: Right;  . PERIPHERAL VASCULAR CATHETERIZATION [name] 01/24/2015     Procedure: Fistulagram;  Surgeon: Conrad Chistochina, MD;  Location: [facility];  Service: Cardiovascular;  Laterality: [name];  . PERIPHERAL VASCULAR CATHETERIZATION [name] 08/29/2015   Procedure: Fistulagram;  Surgeon: Conrad [place], MD;  Location: [facility];  Service: Cardiovascular;  Laterality: [name];  . PERIPHERAL VASCULAR CATHETERIZATION Right 08/29/2015   Procedure: Peripheral Vascular Balloon Angioplasty;  Surgeon: Conrad Effingham, MD;  Location: [facility];  Service: Cardiovascular;  Laterality: Right;  arm fistula  . TEE WITHOUT CARDIOVERSION [name] 12/27/2014   Procedure: TRANSESOPHAGEAL ECHOCARDIOGRAM (TEE);  Surgeon: Thayer Headings, MD;  Location: [facility];  Service: Cardiovascular;  Laterality: [name];  . THROMBECTOMY W/ EMBOLECTOMY Right 03/07/2015   Procedure: THROMBECTOMY OF RIGHT BRACHIOCEPHALIC ARTERIOVENOUS FISTULA;  Surgeon: Conrad Witherbee, MD;  Location: [facility];  Service: Vascular;  Laterality: Right;  . THROMBECTOMY W/ EMBOLECTOMY Right 01/27/2016   Procedure: THROMBECTOMY AND REVISION OF RIGHT UPPER ARM ARTERIOVENOUS GORE-TEX GRAFT;  Surgeon: Angelia Mould, MD;  Location: [facility];  Service: Vascular;  Laterality: Right;  OB History   No obstetric history on file.      Home Medications    Prior to Admission medications   Medication Sig Start Date End Date Taking? Authorizing Provider  albuterol (PROVENTIL HFA;VENTOLIN HFA) 108 (90 Base) MCG/ACT inhaler Inhale 2 puffs into the lungs every 6 (six) hours as needed for wheezing or shortness of breath. 04/03/18  Yes Leone Haven, MD  aspirin EC 81 MG tablet Take 81 mg by mouth See admin instructions. Take one tablet (81 mg) by mouth every Monday, Wednesday, Friday and Sunday morning (non-dialysis days)   Yes [provider]  atorvastatin (LIPITOR) 40 MG tablet Take 1 tablet (40 mg total) by mouth daily at 6 PM. 04/04/18  Yes Leone Haven, MD  Calcium Carbonate Antacid (TUMS ULTRA 1000 PO)  Take 1,000 mg by mouth 3 (three) times daily with meals.   Yes [provider]  carvedilol (COREG) 6.25 MG tablet Take 1 tablet (6.25 mg total) by mouth See admin instructions. Take 6.25 mg by mouth twice daily on Monday, Wednesday, Friday and Sunday. Patient taking differently: Take 6.25 mg by mouth See admin instructions. Take one tablet ( 6.25 mg) by mouth twice daily on Monday, Wednesday, Friday and Sunday. (non-dialysis days) 11/09/16  Yes Leone Haven, MD  clotrimazole (LOTRIMIN) 1 % cream Apply 1 application topically 2 (two) times daily as needed (rash under breasts).  12/04/17  Yes [provider]  gabapentin (NEURONTIN) 300 MG capsule Take 1 capsule (300 mg total) by mouth See admin instructions. Take one capsule (300 mg) by mouth every Monday, Wednesday, Friday and Sunday morning (non-dialysis days) 04/11/18  Yes Leone Haven, MD  glucose blood [facility] VERIO) test strip Test once daily 03/26/18  Yes Leone Haven, MD  Insulin Pen Needle (BD PEN NEEDLE NANO U/F) 32G X 4 MM MISC Use as directed 03/27/18  Yes Leone Haven, MD  Lancets [facility] ULTRASOFT) lancets Use to check blood sugars 2-3 times daily as directed. 03/26/18  Yes Leone Haven, MD  LEVEMIR FLEXTOUCH 100 UNIT/ML Pen INJECT 55 UNITS INTO THE SKIN AT BEDTIME. Patient taking differently: Inject 55 Units into the skin every evening.  07/11/17  Yes Leone Haven, MD  meclizine (ANTIVERT) 25 MG tablet Take 1 tablet (25 mg total) by mouth 3 (three) times daily as needed for dizziness. 03/28/18  Yes Leone Haven, MD  midodrine (PROAMATINE) 5 MG tablet Take 5 mg by mouth See admin instructions. Take one tablet (5 mg) by mouth during dialysis (Tuesday, Thursday, Saturday) as needed for low blood pressure <79/30 06/21/17  Yes [provider]  Multiple Vitamin (MULTIVITAMIN WITH MINERALS) TABS tablet Take 1 tablet by mouth See admin instructions. Take one tablet by mouth every  Monday, Wednesday, Friday and Sunday morning (non-dialysis days) - [facility]   Yes [provider]  omeprazole (PRILOSEC) 20 MG capsule Take 20 mg by mouth See admin instructions. Take one capsule (20 mg) by mouth on Monday, Wednesday, Friday and Sunday morning (non-dialysis days)   Yes [provider]    Family History Family History  Problem Relation Age of Onset  . Diabetes Mother   . Hypertension Mother   . Stroke Sister   . Hypertension Sister   . Heart attack Neg Hx     Social History Social History   Tobacco Use  . Smoking status: Former Research scientist (life sciences)  . Smokeless tobacco: Never Used  Substance Use Topics  . Alcohol use: No  Alcohol/week: 0.0 standard drinks  . Drug use: No     Allergies   Vicodin [hydrocodone-acetaminophen] and Adhesive [tape]   Review of Systems Review of Systems  Constitutional: Negative for activity change, chills and fever.  HENT: Negative for sore throat.   Eyes: Negative for visual disturbance.  Respiratory: Positive for cough and wheezing. Negative for choking and shortness of breath.   Cardiovascular: Positive for chest pain and leg swelling. Negative for palpitations.  Gastrointestinal: Negative for abdominal pain, diarrhea, nausea and vomiting.  Genitourinary: Negative for dysuria.  Musculoskeletal: Negative for back pain, myalgias, neck pain and neck stiffness.  Skin: Negative for rash.  Allergic/Immunologic: Negative for immunocompromised state.  Neurological: Negative for dizziness, seizures, weakness and headaches.  Psychiatric/Behavioral: Negative for confusion.   Physical Exam Updated Vital Signs BP 113/76   Pulse 66   Temp 98.1 F (36.7 C) (Oral)   Resp 14   Ht 5\' 5"  (1.651 m)   Wt 124.3 kg   SpO2 100%   BMI 45.60 kg/m   Physical Exam Vitals signs and nursing note reviewed.  Constitutional:      General: She is not in acute distress.    Comments: Morbidly obese. Chronically ill appearing.  Appears uncomfortable. Olmito and Olmito in place.   HENT:     Head: Normocephalic.  Eyes:     Conjunctiva/sclera: Conjunctivae normal.  Neck:     Musculoskeletal: Neck supple.  Cardiovascular:     Rate and Rhythm: Normal rate and regular rhythm.     Heart sounds: No murmur. No friction rub. No gallop.   Pulmonary:     Effort: Pulmonary effort is normal. No respiratory distress.     Comments: Crackles and rhonchorous breath sounds bilaterally throughout. Increased work of breathing. Conversational dyspnea. Thick, wet sounding cough.  Abdominal:     General: There is no distension.     Palpations: Abdomen is soft.  Musculoskeletal:     Right lower leg: She exhibits no tenderness. Edema present.     Left lower leg: She exhibits no tenderness. Edema present.  Skin:    General: Skin is warm.     Capillary Refill: Capillary refill takes less than 2 seconds.     Findings: No rash.  Neurological:     Mental Status: She is alert.  Psychiatric:        Behavior: Behavior normal.    ED Treatments / Results  Labs (all labs ordered are listed, but only abnormal results are displayed) Labs Reviewed  BASIC METABOLIC PANEL - Abnormal; Notable for the following components:      Result Value   Potassium 5.9 (*)    Glucose, Bld 185 (*)    BUN 44 (*)    Creatinine, Ser 8.32 (*)    Calcium 8.6 (*)    GFR calc non Af Amer 4 (*)    GFR calc Af Amer 5 (*)    All other components within normal limits  CBC - Abnormal; Notable for the following components:   WBC 12.2 (*)    RBC 3.61 (*)    Hemoglobin 9.7 (*)    HCT 34.0 (*)    MCHC 28.5 (*)    RDW 15.8 (*)    All other components within normal limits  BASIC METABOLIC PANEL - Abnormal; Notable for the following components:   Glucose, Bld 180 (*)    BUN 51 (*)    Creatinine, Ser 9.03 (*)    Calcium 8.4 (*)    GFR calc non Af  Amer 4 (*)    GFR calc Af Amer 4 (*)    All other components within normal limits  I-STAT TROPONIN, ED - Abnormal; Notable for  the following components:   Troponin i, poc 0.10 (*)    All other components within normal limits  CBG MONITORING, ED - Abnormal; Notable for the following components:   Glucose-Capillary 111 (*)    All other components within normal limits  CULTURE, BLOOD (ROUTINE X 2)  CULTURE, BLOOD (ROUTINE X 2)  EXPECTORATED SPUTUM ASSESSMENT W REFEX TO RESP CULTURE  GRAM STAIN  INFLUENZA PANEL BY PCR (TYPE A & B)  TROPONIN I    EKG EKG Interpretation  Date/Time:  Friday April 18 2018 16:18:42 EST Ventricular Rate:  93 PR Interval:  196 QRS Duration: 152 QT Interval:  420 QTC Calculation: 522 R Axis:   32 Text Interpretation:  Sinus rhythm with frequent and consecutive Premature ventricular complexes Non-specific intraventricular block Abnormal ECG No significant change since last tracing Confirmed by Duffy Bruce [phone]) on 04/18/2018 11:08:37 PM   Radiology Dg Chest 2 View  Result Date: 04/18/2018 CLINICAL DATA:  Productive cough, congestion, shortness of breath. EXAM: CHEST - 2 VIEW COMPARISON:  04/02/2018 FINDINGS: The cardiac silhouette remains enlarged. Aortic atherosclerosis is noted. There is new abnormal airspace opacity laterally in the left mid lung, and there is milder patchy and streaky opacity in the left lung base. New mild right basilar opacity is also present. No pleural effusion or pneumothorax is identified. No acute osseous abnormality is seen. A vascular stent is noted in the right axillary region. IMPRESSION: Worsening left greater than right lung opacities concerning for pneumonia. Electronically Signed   By: Logan Bores M.D.   On: 04/18/2018 17:13    Procedures Procedures (including critical care time)  Medications Ordered in ED Medications  vancomycin (VANCOCIN) 2,000 mg in sodium chloride 0.9 % 500 mL IVPB (2,000 mg Intravenous New Bag/Given 04/19/18 0652)  atorvastatin (LIPITOR) tablet 40 mg (has no administration in time range)  carvedilol (COREG) tablet 6.25  mg (has no administration in time range)  midodrine (PROAMATINE) tablet 5 mg (has no administration in time range)  gabapentin (NEURONTIN) capsule 300 mg (has no administration in time range)  pantoprazole (PROTONIX) EC tablet 40 mg (has no administration in time range)  meclizine (ANTIVERT) tablet 25 mg (has no administration in time range)  aspirin EC tablet 81 mg (has no administration in time range)  heparin injection 5,000 Units (has no administration in time range)  sodium chloride flush (NS) 0.9 % injection 3 mL (has no administration in time range)  acetaminophen (TYLENOL) tablet 650 mg (has no administration in time range)    Or  acetaminophen (TYLENOL) suppository 650 mg (has no administration in time range)  ondansetron (ZOFRAN) tablet 4 mg (has no administration in time range)    Or  ondansetron (ZOFRAN) injection 4 mg (has no administration in time range)  guaiFENesin (MUCINEX) 12 hr tablet 600 mg (has no administration in time range)  ceFEPIme (MAXIPIME) 1 g in sodium chloride 0.9 % 100 mL IVPB (has no administration in time range)  insulin aspart (novoLOG) injection 0-9 Units (0 Units Subcutaneous Not Given 04/19/18 0812)  vancomycin (VANCOCIN) IVPB 1000 mg/200 mL premix (has no administration in time range)  insulin detemir (LEVEMIR) injection 55 Units (has no administration in time range)  calcium carbonate (TUMS - dosed in mg elemental calcium) chewable tablet 2,000 mg (has no administration in time range)  ipratropium-albuterol (DUONEB)  0.5-2.5 (3) MG/3ML nebulizer solution 3 mL (3 mLs Nebulization Given 04/19/18 0810)  albuterol (PROVENTIL) (2.5 MG/3ML) 0.083% nebulizer solution 2.5 mg (has no administration in time range)  methylPREDNISolone sodium succinate (SOLU-MEDROL) 125 mg/2 mL injection 60 mg (has no administration in time range)  benzonatate (TESSALON) capsule 200 mg (has no administration in time range)  loratadine (CLARITIN) tablet 10 mg (has no administration in  time range)  fluticasone (FLONASE) 50 MCG/ACT nasal spray 2 spray (has no administration in time range)  oxymetazoline (AFRIN) 0.05 % nasal spray 1 spray (has no administration in time range)  ipratropium-albuterol (DUONEB) 0.5-2.5 (3) MG/3ML nebulizer solution 3 mL (3 mLs Nebulization Given 04/19/18 0349)  ceFEPIme (MAXIPIME) 2 g in sodium chloride 0.9 % 100 mL IVPB (0 g Intravenous Stopped 04/19/18 0550)  methylPREDNISolone sodium succinate (SOLU-MEDROL) 125 mg/2 mL injection 125 mg (125 mg Intravenous Given 04/19/18 3614)     Initial Impression / Assessment and Plan / ED Course  I have reviewed the triage vital signs and the nursing notes.  Pertinent labs & imaging results that were available during my care of the patient were reviewed by me and considered in my medical decision making (see chart for details).     78 year old female with a history of chronic systolic CHF, ESRD on HD (T/R/S), and COPD presenting with shortness of breath, productive cough with green and white sputum, and chest pain for 1 week.  Patient was seen and evaluated along with Dr. Ellender Hose, attending physician.  She is afebrile.  She was found to be hypoxic at 88% on room air and was placed on 2 L [place].  No tachycardia.  On exam, wheezes and crackles are noted bilaterally throughout.  Labs are notable for leukocytosis of 12.2.  Troponin is 0.10.  She has had elevated troponin in the past, and this is decreased from previous.  Potassium is 5.9.  Glucose is 180.  Creatinine is 9.03; the patient is a hemodialysis patient.  Hemoglobin is stable at 9.7.  Chest x-ray with worsening left greater than right lung opacities concerning for pneumonia.  Cefepime and vancomycin were initiated in the ED.  Will treat patient's hyper kalemia with albuterol nebulizer treatments as the patient's EKG is unchanged from previous.  I suspect the patient has a mixed component aside from opacity concerning for pneumonia on chest x-ray since she also  has a history of CHF and COPD.  Consulted the hospitalist team for admission and spoke with Dr. Tamala Julian who will accept the patient. The patient appears reasonably stabilized for admission considering the current resources, flow, and capabilities available in the ED at this time, and I doubt any other [facility] requiring further screening and/or treatment in the ED prior to admission.   Final Clinical Impressions(s) / ED Diagnoses   Final diagnoses:  HCAP (healthcare-associated pneumonia)    ED Discharge Orders    None       Andersson Larrabee A, PA-C 04/19/18 0830    Duffy Bruce, MD 04/19/18 1357

## 2018-04-19 NOTE — ED Notes (Signed)
Called pharmacy for tums.  Also stated to hold vanc for dialysis.

## 2018-04-19 NOTE — ED Notes (Signed)
ORDERED DIET TRAY FOR PT  

## 2018-04-19 NOTE — Progress Notes (Signed)
PROGRESS NOTE        PATIENT DETAILS Name: Jordan [name] Age: 79 y.o. Sex: female Date of Birth: 03/03/1941 Admit Date: 04/18/2018 Admitting Physician Norval Morton, MD XNT:[id number], Angela Adam, MD  Brief Narrative: Patient is a 78 y.o. female with history of ESRD on HD, COPD, DM-2, chronic diastolic heart failure-who presented with 5-day history of productive cough, chest congestion and worsening shortness of breath.  She just completed a prednisone taper and doxycycline as a outpatient with no significant relief of her symptoms.  She was evaluated in the emergency room, thought to have pneumonia and COPD exacerbation-and subsequently admitted to the hospitalist service.  See below for further details  Subjective: Not any better than on admission-continues to cough-with greenish sputum.  Has coarse wheezing all over.  However she is not in any distress and appears comfortable.  Complains of significant nasal/sinus congestion as well.  Assessment/Plan: Acute hypoxic respiratory failure secondary to multifocal pneumonia and COPD exacerbation: Continue empiric vancomycin/cefepime-restart IV Solu-Medrol along with scheduled bronchodilators.  Given significant nasal congestion-we will add Claritin/Flonase/Afrin.  Moving air well but has coarse rhonchi all over.  Await influenza PCR and blood cultures..   Will follow patient closely and narrow antimicrobial spectrum accordingly.  ESRD on HD TTS: Nephrology consulted-defer dialysis needs to nephrology.  Chronic diastolic heart failure: Volume status seems reasonably stable-diuresis with HD.  DM-2: CBG stable-continue Levemir and SSI.  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  DVT  Prophylaxis: Prophylactic Heparin  Code Status: Full code   Family Communication: None at bedside  Disposition Plan: Remain inpatient  Antimicrobial agents: Anti-infectives (From admission, onward)   Start     Dose/Rate Route Frequency Ordered Stop   04/19/18 2200  ceFEPIme (MAXIPIME) 1 g in sodium chloride 0.9 % 100 mL IVPB     1 g 200 mL/hr over 30 Minutes Intravenous Every 24 hours 04/19/18 0506 04/27/18 2159   04/19/18 1200  vancomycin (VANCOCIN) IVPB 1000 mg/200 mL premix     1,000 mg 200 mL/hr over 60 Minutes Intravenous Every T-Th-Sa (Hemodialysis) 04/19/18 0530     04/19/18 0345  ceFEPIme (MAXIPIME) 2 g in sodium chloride 0.9 % 100 mL IVPB     2 g 200 mL/hr over 30 Minutes Intravenous  Once 04/19/18 0335 04/19/18 0550   04/19/18 0345  vancomycin (VANCOCIN) 2,000 mg in sodium chloride 0.9 % 500 mL IVPB     2,000 mg 250 mL/hr over 120 Minutes Intravenous  Once 04/19/18 0344 04/19/18 0852      Procedures: None  CONSULTS:  nephrology  Time spent: 25- minutes-Greater than 50% of this time was spent in counseling, explanation of diagnosis, planning of further management, and coordination of care.  MEDICATIONS: Scheduled Meds: . [START ON 04/20/2018] aspirin EC  81 mg Oral Q M,W,F,Su-1800  . atorvastatin  40 mg Oral q1800  . calcium carbonate  2,000 mg Oral TID WC  . [START ON 04/20/2018] carvedilol  6.25 mg Oral 2 times per day on Sun Mon Wed Fri  . fluticasone  2 spray Each Nare Daily  . [START ON 04/20/2018] gabapentin  300 mg Oral Q M,W,F,Su-1800  . guaiFENesin  600 mg Oral BID  .  heparin  5,000 Units Subcutaneous Q8H  . insulin aspart  0-9 Units Subcutaneous TID WC  . insulin detemir  55 Units Subcutaneous QPM  . ipratropium-albuterol  3 mL Nebulization Q6H  . loratadine  10 mg Oral Daily  . methylPREDNISolone (SOLU-MEDROL) injection  60 mg Intravenous Q8H  . oxymetazoline  1 spray Each Nare BID  . [START ON 04/20/2018] pantoprazole  40 mg Oral Q M,W,F,Su-1800  .  sodium chloride flush  3 mL Intravenous Q12H   Continuous Infusions: . ceFEPime (MAXIPIME) IV    . vancomycin     PRN Meds:.acetaminophen **OR** acetaminophen, albuterol, benzonatate, meclizine, midodrine, ondansetron **OR** ondansetron (ZOFRAN) IV   PHYSICAL EXAM: Vital signs: Vitals:   04/19/18 0058 04/19/18 0345 04/19/18 0400 04/19/18 0415  BP: (!) 138/57 (!) 139/92 (!) 117/103 113/76  Pulse: 72 71 63 66  Resp: 20 17 15 14   Temp: 98.1 F (36.7 C)     TempSrc: Oral     SpO2: 100% 94% 100% 100%  Weight:      Height:       Filed Weights   04/18/18 1622  Weight: 124.3 kg   Body mass index is 45.6 kg/m.   General appearance :Awake, alert, not in any distress. Eyes:Pink conjunctiva HEENT: Atraumatic and Normocephalic Neck: supple Resp:Good air entry bilaterally, coarse rhonchi all over CVS: S1 S2 regular, no murmurs.  GI: Bowel sounds present, Non tender and not distended with no gaurding, rigidity or rebound.No organomegaly Extremities: B/L Lower Ext shows trace edema, both legs are warm to touch Neurology:  speech clear,Non focal, sensation is grossly intact. Musculoskeletal:No digital cyanosis Skin:No Rash, warm and dry Wounds:[name]   I have personally reviewed following labs and imaging studies  LABORATORY DATA: CBC: Recent Labs  Lab 04/18/18 1624  WBC 12.2*  HGB 9.7*  HCT 34.0*  MCV 94.2  PLT 662    Basic Metabolic Panel: Recent Labs  Lab 04/18/18 1624 04/19/18 0346  NA 139 136  K 5.9* 5.0  CL 98 98  CO2 30 28  GLUCOSE 185* 180*  BUN 44* 51*  CREATININE 8.32* 9.03*  CALCIUM 8.6* 8.4*    GFR: Estimated Creatinine Clearance: 7.1 mL/min (A) (by C-G formula based on SCr of 9.03 mg/dL (H)).  Liver Function Tests: No results for input(s): AST, ALT, ALKPHOS, BILITOT, PROT, ALBUMIN in the last 168 hours. No results for input(s): LIPASE, AMYLASE in the last 168 hours. No results for input(s): AMMONIA in the last 168 hours.  Coagulation  Profile: No results for input(s): INR, PROTIME in the last 168 hours.  Cardiac Enzymes: Recent Labs  Lab 04/19/18 0853  TROPONINI 0.08*    BNP (last 3 results) No results for input(s): PROBNP in the last 8760 hours.  HbA1C: No results for input(s): HGBA1C in the last 72 hours.  CBG: Recent Labs  Lab 04/19/18 0804  GLUCAP 111*    Lipid Profile: No results for input(s): CHOL, HDL, LDLCALC, TRIG, CHOLHDL, LDLDIRECT in the last 72 hours.  Thyroid Function Tests: No results for input(s): TSH, T4TOTAL, FREET4, T3FREE, THYROIDAB in the last 72 hours.  Anemia Panel: No results for input(s): VITAMINB12, FOLATE, FERRITIN, TIBC, IRON, RETICCTPCT in the last 72 hours.  Urine analysis:    Component Value Date/Time   COLORURINE AMBER (A) 12/23/2014 0941   APPEARANCEUR TURBID (A) 12/23/2014 0941   LABSPEC 1.019 12/23/2014 0941   PHURINE 5.0 12/23/2014 0941   GLUCOSEU NEGATIVE 12/23/2014 0941   HGBUR SMALL (A) 12/23/2014 0941   BILIRUBINUR  SMALL (A) 12/23/2014 0941   KETONESUR 15 (A) 12/23/2014 0941   PROTEINUR 100 (A) 12/23/2014 0941   UROBILINOGEN 0.2 12/23/2014 0941   NITRITE NEGATIVE 12/23/2014 0941   LEUKOCYTESUR LARGE (A) 12/23/2014 0941    Sepsis Labs: Lactic Acid, Venous    Component Value Date/Time   LATICACIDVEN 1.31 12/23/2014 0916    MICROBIOLOGY: Recent Results (from the past 240 hour(s))  Blood culture (routine x 2)     Status: None (Preliminary result)   Collection Time: 04/19/18  4:05 AM  Result Value Ref Range Status   Specimen Description BLOOD RIGHT HAND  Final   Special Requests   Final    BOTTLES DRAWN AEROBIC ONLY Blood Culture results may not be optimal due to an inadequate volume of blood received in culture bottles Performed at [facility] [address] [place], [place] [address]    Culture NO GROWTH < 12 HOURS  Final   Report Status PENDING  Incomplete    RADIOLOGY STUDIES/RESULTS: Dg Chest 2 View  Result Date:  04/18/2018 CLINICAL DATA:  Productive cough, congestion, shortness of breath. EXAM: CHEST - 2 VIEW COMPARISON:  04/02/2018 FINDINGS: The cardiac silhouette remains enlarged. Aortic atherosclerosis is noted. There is new abnormal airspace opacity laterally in the left mid lung, and there is milder patchy and streaky opacity in the left lung base. New mild right basilar opacity is also present. No pleural effusion or pneumothorax is identified. No acute osseous abnormality is seen. A vascular stent is noted in the right axillary region. IMPRESSION: Worsening left greater than right lung opacities concerning for pneumonia. Electronically Signed   By: Logan Bores M.D.   On: 04/18/2018 17:13   Dg Chest 2 View  Result Date: 04/03/2018 CLINICAL DATA:  Shortness of breath with exertion, wheezing EXAM: CHEST - 2 VIEW COMPARISON:  Chest x-ray of 03/24/2017 FINDINGS: Moderate cardiomegaly remains and there is some opacity at the left lung base but no definite pneumonia or effusion is noted on the lateral view. This may be due to cardiomegaly although scarring is a consideration. Very mild pulmonary vascular congestion could not be excluded. No bony abnormality is seen. IMPRESSION: 1. Stable moderate cardiomegaly. And exclude mild pulmonary vascular congestion. 2. Opacity at the left lung base most likely is chronic, possibly due to cardiomegaly and/or scarring. Electronically Signed   By: Ivar Drape M.D.   On: 04/03/2018 08:56   Dg Chest 2 View  Result Date: 03/24/2018 CLINICAL DATA:  Productive cough EXAM: CHEST - 2 VIEW COMPARISON:  12/23/2014 FINDINGS: Cardiomegaly with central vascular congestion. No focal airspace disease. No pneumothorax. IMPRESSION: 1. Cardiomegaly with central vascular congestion. Electronically Signed   By: Donavan Foil M.D.   On: 03/24/2018 15:33     LOS: 0 days   Oren Binet, MD  [facility]  If 7PM-7AM, please contact night-coverage  Please page via  [email or web address]-Password [email or web address]-click on MD name and type text message  04/19/2018, 11:20 AM

## 2018-04-20 ENCOUNTER — Inpatient Hospital Stay (HOSPITAL_COMMUNITY): Payer: Medicare Other

## 2018-04-20 LAB — RENAL FUNCTION PANEL
Albumin: 3 g/dL — ABNORMAL LOW (ref 3.5–5.0)
Anion gap: 12 (ref 5–15)
BUN: 73 mg/dL — ABNORMAL HIGH (ref 8–23)
CALCIUM: 8.1 mg/dL — AB (ref 8.9–10.3)
CO2: 24 mmol/L (ref 22–32)
Chloride: 97 mmol/L — ABNORMAL LOW (ref 98–111)
Creatinine, Ser: 10.97 mg/dL — ABNORMAL HIGH (ref 0.44–1.00)
GFR calc non Af Amer: 3 mL/min — ABNORMAL LOW (ref >60)
GFR, EST AFRICAN AMERICAN: 4 mL/min — AB (ref >60)
Glucose, Bld: 208 mg/dL — ABNORMAL HIGH (ref 70–99)
Phosphorus: 5.5 mg/dL — ABNORMAL HIGH (ref 2.5–4.6)
Potassium: 6.3 mmol/L (ref 3.5–5.1)
Sodium: 133 mmol/L — ABNORMAL LOW (ref 135–145)

## 2018-04-20 LAB — GLUCOSE, CAPILLARY
Glucose-Capillary: 145 mg/dL — ABNORMAL HIGH (ref 70–99)
Glucose-Capillary: 200 mg/dL — ABNORMAL HIGH (ref 70–99)
Glucose-Capillary: 227 mg/dL — ABNORMAL HIGH (ref 70–99)
Glucose-Capillary: 264 mg/dL — ABNORMAL HIGH (ref 70–99)
Glucose-Capillary: 281 mg/dL — ABNORMAL HIGH (ref 70–99)

## 2018-04-20 LAB — CBC
HCT: 33.6 % — ABNORMAL LOW (ref 36.0–46.0)
Hemoglobin: 9.6 g/dL — ABNORMAL LOW (ref 12.0–15.0)
MCH: 26.5 pg (ref 26.0–34.0)
MCHC: 28.6 g/dL — ABNORMAL LOW (ref 30.0–36.0)
MCV: 92.8 fL (ref 80.0–100.0)
Platelets: 168 10*3/uL (ref 150–400)
RBC: 3.62 MIL/uL — AB (ref 3.87–5.11)
RDW: 15.5 % (ref 11.5–15.5)
WBC: 12.7 10*3/uL — ABNORMAL HIGH (ref 4.0–10.5)
nRBC: 0 % (ref 0.0–0.2)

## 2018-04-20 LAB — EXPECTORATED SPUTUM ASSESSMENT W GRAM STAIN, RFLX TO RESP C

## 2018-04-20 LAB — BRAIN NATRIURETIC PEPTIDE: B Natriuretic Peptide: 884.2 pg/mL — ABNORMAL HIGH (ref 0.0–100.0)

## 2018-04-20 MED ORDER — DOXYCYCLINE HYCLATE 100 MG PO TABS
100.0000 mg | ORAL_TABLET | Freq: Two times a day (BID) | ORAL | Status: DC
  Administered 2018-04-20 – 2018-04-21 (×2): 100 mg via ORAL
  Filled 2018-04-20 (×2): qty 1

## 2018-04-20 MED ORDER — OSELTAMIVIR PHOSPHATE 30 MG PO CAPS: 30.0000 mg | ORAL_CAPSULE | ORAL | Status: DC

## 2018-04-20 MED ORDER — INSULIN DETEMIR 100 UNIT/ML ~~LOC~~ SOLN
55.0000 [IU] | Freq: Every day | SUBCUTANEOUS | Status: DC
  Administered 2018-04-20: 55 [IU] via SUBCUTANEOUS
  Filled 2018-04-20 (×2): qty 0.55

## 2018-04-20 MED ORDER — DOXYCYCLINE HYCLATE 100 MG PO CAPS: 100.0000 mg | ORAL_CAPSULE | Freq: Two times a day (BID) | ORAL | 0 refills | Status: DC

## 2018-04-20 MED ORDER — OSELTAMIVIR PHOSPHATE 30 MG PO CAPS
30.0000 mg | ORAL_CAPSULE | Freq: Once | ORAL | Status: AC
  Administered 2018-04-20: 30 mg via ORAL
  Filled 2018-04-20: qty 1

## 2018-04-20 MED ORDER — SODIUM POLYSTYRENE SULFONATE 15 GM/60ML PO SUSP: 30.0000 g | Freq: Once | ORAL | Status: DC

## 2018-04-20 MED ORDER — OSELTAMIVIR PHOSPHATE 30 MG PO CAPS: 30.0000 mg | ORAL_CAPSULE | ORAL | 0 refills | Status: DC

## 2018-04-20 MED ORDER — SODIUM ZIRCONIUM CYCLOSILICATE 10 G PO PACK
10.0000 g | PACK | Freq: Three times a day (TID) | ORAL | Status: DC
  Filled 2018-04-20: qty 1

## 2018-04-20 MED ORDER — VANCOMYCIN HCL IN DEXTROSE 1-5 GM/200ML-% IV SOLN
INTRAVENOUS | Status: AC
  Administered 2018-04-20: 1000 mg via INTRAVENOUS
  Filled 2018-04-20: qty 200

## 2018-04-20 MED ORDER — METHYLPREDNISOLONE 4 MG PO TBPK: ORAL_TABLET | ORAL | 0 refills | Status: DC

## 2018-04-20 NOTE — Plan of Care (Signed)
Monitor Oxygen sats.

## 2018-04-20 NOTE — Progress Notes (Signed)
Ambulated in room to doorway with walker on Room Air. Oxygen sats = 83 - 85 % RA. C/O feeling "very short of breath".  O2 @ 2L via [place] applied back to bed. Recovered to 95 - 100 % at rest. Dr. Candiss Norse notified. Discharge cancelled. Orders received.

## 2018-04-20 NOTE — Progress Notes (Signed)
HD tx completed @ 1443 w/o problem UF goal met Blood rinsed back VSS Report called to Pearson Grippe, RN

## 2018-04-20 NOTE — Evaluation (Signed)
Physical Therapy Evaluation Patient Details Name: Jordan [name] MRN: [id number] DOB: 1940/07/17 Today's Date: 04/20/2018   History of Present Illness  Jordan [name] is a 78 y.o. female admitted with SOB/HCAP. PMHx: CAD, COPD, ESRD on HD (TTS), DM, HTN, CHF  Clinical Impression  Pt pleasant in chair on arrival. Pt reports she is still with daughter here in town with 24steps to get to apartment. Pt has been performing stairs for anytime leaving the house but is fatigued when she gets done and tends to rush to complete activity. Pt educated for current need for supplemental oxygen with mobility and need for energy conservation particularly with stairs. Pt reports being active with HHPT and this remains appropriate if pt able to maintain oxygen saturation. Pt with decreased strength, gait and activity tolerance with impaired cardiopulmonary function who will benefit from acute therapy to maximize gait and function to decrease burden of care.      Follow Up Recommendations Home health PT;Supervision for mobility/OOB    Equipment Recommendations  None recommended by PT    Recommendations for Other Services       Precautions / Restrictions Precautions Precautions: Fall Precaution Comments: watch sats      Mobility  Bed Mobility               General bed mobility comments: in chair on arrival  Transfers Overall transfer level: Modified independent                  Ambulation/Gait Ambulation/Gait assistance: Supervision Gait Distance (Feet): 50 Feet Assistive device: Rolling walker (2 wheeled) Gait Pattern/deviations: Step-through pattern;Decreased stride length;Trunk flexed   Gait velocity interpretation: >2.62 ft/sec, indicative of community ambulatory General Gait Details: pt walked 50' x 2 trials with reliance on RW, cues for posture and breathing. Desaturation to 82% on RA with gait, 91% on 2L with gait  Stairs            Wheelchair  Mobility    Modified Rankin (Stroke Patients Only)       Balance Overall balance assessment: Needs assistance   Sitting balance-Leahy Scale: Good       Standing balance-Leahy Scale: Poor Standing balance comment: reliance on bil UE on RW for standing                             Pertinent Vitals/Pain Pain Assessment: No/denies pain    Home Living Family/patient expects to be discharged to:: Private residence Living Arrangements: Children Available Help at Discharge: Family;Available 24 hours/day Type of Home: Apartment Home Access: Stairs to enter   Entrance Stairs-Number of Steps: 24 Home Layout: One level Home Equipment: Walker - 2 wheels;Grab bars - tub/shower;Tub bench;Hand held shower head;Bedside commode Additional Comments: Pt staying with daughter here visiting.  Daughter has 24 steps into her apartment.  Pt has RW and tub bench here.  Other equipment is at her home in [place].  Pt has a level entry at her home     Prior Function Level of Independence: Needs assistance   Gait / Transfers Assistance Needed: used RW at all times per pt  ADL's / Homemaking Assistance Needed: assist for homemaking  Comments: Does not drive     Hand Dominance   Dominant Hand: Right    Extremity/Trunk Assessment   Upper Extremity Assessment Upper Extremity Assessment: Generalized weakness    Lower Extremity Assessment Lower Extremity Assessment: Generalized weakness    Cervical / Trunk Assessment  Cervical / Trunk Assessment: Kyphotic  Communication   Communication: HOH  Cognition Arousal/Alertness: Awake/alert Behavior During Therapy: WFL for tasks assessed/performed Overall Cognitive Status: Within Functional Limits for tasks assessed                                        General Comments      Exercises     Assessment/Plan    PT Assessment Patient needs continued PT services  PT Problem List Decreased strength;Decreased  balance;Decreased activity tolerance;Cardiopulmonary status limiting activity;Decreased knowledge of use of DME;Decreased mobility       PT Treatment Interventions DME instruction;Functional mobility training;Patient/family education;Gait training;Therapeutic activities;Stair training;Therapeutic exercise    PT Goals (Current goals can be found in the Care Plan section)  Acute Rehab PT Goals Patient Stated Goal: be able to return to my home in [place] PT Goal Formulation: With patient Time For Goal Achievement: 05/04/18 Potential to Achieve Goals: Fair    Frequency Min 3X/week   Barriers to discharge        Co-evaluation               AM-PAC PT "6 Clicks" Mobility  Outcome Measure Help needed turning from your back to your side while in a flat bed without using bedrails?: A Little Help needed moving from lying on your back to sitting on the side of a flat bed without using bedrails?: A Little Help needed moving to and from a bed to a chair (including a wheelchair)?: None Help needed standing up from a chair using your arms (e.g., wheelchair or bedside chair)?: None Help needed to walk in hospital room?: A Little Help needed climbing 3-5 steps with a railing? : A Little 6 Click Score: 20    End of Session Equipment Utilized During Treatment: Gait belt;Oxygen Activity Tolerance: Patient limited by fatigue Patient left: in chair;with call bell/phone within reach Nurse Communication: Mobility status PT Visit Diagnosis: Other abnormalities of gait and mobility (R26.89);Muscle weakness (generalized) (M62.81)    Time: 7530-0511 PT Time Calculation (min) (ACUTE ONLY): 21 min   Charges:   PT Evaluation $PT Eval Moderate Complexity: 1 Mod          [place], PT Acute Rehabilitation Services Pager: [phone] Office: [phone]   Gissele Narducci B Chelsae Zanella 04/20/2018, 1:27 PM

## 2018-04-20 NOTE — Evaluation (Signed)
Clinical/Bedside Swallow Evaluation Patient Details  Name: Jordan [name] MRN: [id number] Date of Birth: [birth year]  Today's Date: 04/20/2018 Time: SLP Start Time (ACUTE ONLY): 2671 SLP Stop Time (ACUTE ONLY): 0930 SLP Time Calculation (min) (ACUTE ONLY): 26 min  Past Medical History:  Past Medical History:  Diagnosis Date  . Arthritis   . Breast cancer ([place])   . CAD (coronary artery disease)    a. minimal CAD by cath in 10/2014.  Mar[place] Kitchen CKD (chronic kidney disease)   . COPD (chronic obstructive pulmonary disease) ([place])   . Diabetes mellitus with ESRD (end-stage renal disease) ([place])   . Enterococcal bacteremia 12/26/2014  . GERD (gastroesophageal reflux disease)   . Hypertension   . Renal disorder    Family reports acute renal failure  . Sleep apnea    does not wear CPAP  . Systolic CHF, chronic ([place]) 12/23/2014   Past Surgical History:  Past Surgical History:  Procedure Laterality Date  . ARTERIOVENOUS GRAFT PLACEMENT Right 10/05/15  . AV FISTULA PLACEMENT Right 10/26/2014   Procedure: Right Brachiocephalic Arteriovenous FISTULA CREATION;  Surgeon: Conrad [place], MD;  Location: [facility];  Service: Vascular;  Laterality: Right;  . AV FISTULA PLACEMENT Right 10/05/2015   Procedure: INSERTION OF  ARTERIOVENOUS (AV) GORE-TEX GRAFT RIGHT ARM;  Surgeon: Conrad [place], MD;  Location: [facility];  Service: Vascular;  Laterality: Right;  . BASCILIC VEIN TRANSPOSITION Right 04/13/2015   Procedure: RIGHT FIRST STAGE BASCILIC VEIN TRANSPOSITION;  Surgeon: Conrad Antimony, MD;  Location: [facility];  Service: Vascular;  Laterality: Right;  . BASCILIC VEIN TRANSPOSITION Right 07/11/2015   Procedure: SECOND STAGE BASILIC VEIN TRANSPOSITION;  Surgeon: Conrad [place], MD;  Location: [facility];  Service: Vascular;  Laterality: Right;  . BREAST SURGERY Left   . CARDIAC CATHETERIZATION [name] 10/19/2014   Procedure: Right/Left Heart Cath and Coronary Angiography;  Surgeon: Peter M Martinique, MD;  Location: [facility];   Service: Cardiovascular;  Laterality: [name];  . FISTULA SUPERFICIALIZATION Right 01/26/2015   Procedure: Right BRACHIOCEPHALIC ARTERIOVENOUS FISTULA SUPERFICIALIZATION with side branch ligation;  Surgeon: Conrad [place], MD;  Location: [facility];  Service: Vascular;  Laterality: Right;  . IR DIALY SHUNT INTRO [place] W/IMG RIGHT Right 08/23/2016  . IR GENERIC HISTORICAL  04/30/2016   IR REMOVAL TUN CV CATH W/O FL 04/30/2016 Aletta Edouard, MD [facility]  . IR US GUIDE VASC ACCESS RIGHT  08/23/2016  . LIGATION OF ARTERIOVENOUS  FISTULA Right 03/07/2015   Procedure: LIGATION OF RIGHT BRACHIOCEPHALIC ARTERIOVENOUS  FISTULA;  Surgeon: Conrad [place], MD;  Location: [facility];  Service: Vascular;  Laterality: Right;  . LIGATION OF ARTERIOVENOUS  FISTULA Right 10/05/2015   Procedure: LIGATION OF RIGHT BASILIC VEIN TRANSPOSITION; EXCISION OF CICATRIX;  Surgeon: Conrad [place], MD;  Location: [facility];  Service: Vascular;  Laterality: Right;  . PERIPHERAL VASCULAR CATHETERIZATION [name] 01/24/2015   Procedure: Fistulagram;  Surgeon: Conrad Bolton, MD;  Location: [facility];  Service: Cardiovascular;  Laterality: [name];  . PERIPHERAL VASCULAR CATHETERIZATION [name] 08/29/2015   Procedure: Fistulagram;  Surgeon: Conrad Brave, MD;  Location: [facility];  Service: Cardiovascular;  Laterality: [name];  . PERIPHERAL VASCULAR CATHETERIZATION Right 08/29/2015   Procedure: Peripheral Vascular Balloon Angioplasty;  Surgeon: Conrad Custer, MD;  Location: [facility];  Service: Cardiovascular;  Laterality: Right;  arm fistula  . TEE WITHOUT CARDIOVERSION [name] 12/27/2014   Procedure: TRANSESOPHAGEAL ECHOCARDIOGRAM (TEE);  Surgeon: Wonda Cheng  Nahser, MD;  Location: [facility];  Service: Cardiovascular;  Laterality: [name];  . THROMBECTOMY W/ EMBOLECTOMY Right 03/07/2015   Procedure: THROMBECTOMY OF RIGHT BRACHIOCEPHALIC ARTERIOVENOUS FISTULA;  Surgeon: Conrad [place], MD;  Location: [facility];  Service: Vascular;   Laterality: Right;  . THROMBECTOMY W/ EMBOLECTOMY Right 01/27/2016   Procedure: THROMBECTOMY AND REVISION OF RIGHT UPPER ARM ARTERIOVENOUS GORE-TEX GRAFT;  Surgeon: Angelia Mould, MD;  [facility];  Service: Vascular;  Laterality: Right;   HPI:  Jordan [name] is a 78 y.o. female with medical history significant of GERD, ESRD on HD (TTS),  CAD, COPD not on oxygen, type 2 diabetes, hypertension, chronic diastolic congestive heart failure; admitted with HCAP vs aspiration PNA. Per chart review pt complains of choking and wrong pipe sensation with eating. Chest x-ray showing worsening left greater than right lung opacities concerning for pneumonia    Assessment / Plan / Recommendation Clinical Impression   Patient presents with baseline congested coughing, which is noted intermittently with and without PO consumption. Pt denies choking with meals, although with further questioning she does state that she has this sensation rarely (less than once a week) if she eats too fast. When offered POs, pt independently repositioned herself to edge of bed, stating she prefers to sit upright all the way when she eats. She avoids most meats or cuts them finely due to her dentition (few teeth, no dentures). Question delayed coughing with solids vs baseline coughing. Airway protection appears to be adequate, although pt does have increasing tachypnea with consecutive bites, sips. Advise pt continue current diet at this time, with esophageal precautions and rest breaks. Will follow up for tolerance vs need for instrumental testing.     SLP Visit Diagnosis: Dysphagia, unspecified (R13.10)    Aspiration Risk  Mild aspiration risk    Diet Recommendation Regular;Thin liquid(pt independently selects softer foods)   Liquid Administration via: Cup;Straw Medication Administration: Whole meds with liquid Supervision: Patient able to self feed Compensations: Slow rate;Small sips/bites;Other  (Comment);Follow solids with liquid(take rest breaks) Postural Changes: Seated upright at 90 degrees;Remain upright for at least 30 minutes after po intake    Other  Recommendations Oral Care Recommendations: Oral care BID   Follow up Recommendations Other (comment)(tbd)      Frequency and Duration min 1 x/week  1 week       Prognosis Prognosis for Safe Diet Advancement: Good      Swallow Study   General Date of Onset: 04/19/18 HPI: Jordan [name] is a 78 y.o. female with medical history significant of GERD, ESRD on HD (TTS),  CAD, COPD not on oxygen, type 2 diabetes, hypertension, chronic diastolic congestive heart failure; admitted with HCAP vs aspiration PNA. Per chart review pt complains of choking and wrong pipe sensation with eating. Chest x-ray showing worsening left greater than right lung opacities concerning for pneumonia  Type of Study: Bedside Swallow Evaluation Previous Swallow Assessment: none on file Diet Prior to this Study: Regular;Thin liquids Temperature Spikes Noted: No Respiratory Status: Room air History of Recent Intubation: No Behavior/Cognition: Alert;Cooperative;Pleasant mood Oral Cavity Assessment: Within Functional Limits Oral Care Completed by SLP: No Oral Cavity - Dentition: Missing dentition(few teeth) Vision: Functional for self-feeding Self-Feeding Abilities: Able to feed self Patient Positioning: Upright in bed Baseline Vocal Quality: Normal Volitional Cough: Strong;Congested Volitional Swallow: Able to elicit    Oral/Motor/Sensory Function Overall Oral Motor/Sensory Function: Within functional limits   Ice Chips Ice chips: Within functional limits   Thin Liquid  Thin Liquid: Within functional limits Presentation: Cup;Straw;Self Fed    Nectar Thick Nectar Thick Liquid: Not tested   Honey Thick Honey Thick Liquid: Not tested   Puree Puree: Within functional limits Presentation: Spoon;Self [place], [place],  [place] Pathologist Acute Rehabilitation Services Pager: [phone] Office: [phone]   Solid: Impaired Presentation: Self Fed;Spoon(crumbles crackers in applesauce) Pharyngeal Phase Impairments: Cough - Delayed      Aliene Altes 04/20/2018,9:34 AM

## 2018-04-20 NOTE — Discharge Instructions (Signed)
Follow with Primary MD Leone Haven, MD in 5 days   Get CBC, BMP, 2 view Chest X ray  checked  by Primary MD  in 5 days   Activity: As tolerated with Full fall precautions use walker/cane & assistance as needed  Disposition Home   Diet: Heart Healthy with 1.2 L/day total fluid restriction.  Special Instructions: If you have smoked or chewed Tobacco  in the last 2 yrs please stop smoking, stop any regular Alcohol  and or any Recreational drug use.  On your next visit with your primary care physician please Get Medicines reviewed and adjusted.  Please request your Prim.MD to go over all Hospital Tests and Procedure/Radiological results at the follow up, please get all Hospital records sent to your Prim MD by signing hospital release before you go home.  If you experience worsening of your admission symptoms, develop shortness of breath, life threatening emergency, suicidal or homicidal thoughts you must seek medical attention immediately by calling 911 or calling your MD immediately  if symptoms less severe.  You Must read complete instructions/literature along with all the possible adverse reactions/side effects for all the Medicines you take and that have been prescribed to you. Take any new Medicines after you have completely understood and accpet all the possible adverse reactions/side effects.

## 2018-04-20 NOTE — Progress Notes (Signed)
SATURATION QUALIFICATIONS: (This note is used to comply with regulatory documentation for home oxygen)  Patient Saturations on Room Air at Rest = 92%  Patient Saturations on Room Air while Ambulating = 82%  Patient Saturations on 2 Liters of oxygen while Ambulating = 92%  Please briefly explain why patient needs home oxygen: Pt desaturating with activity on RA and requires supplemental oxygen with movement to maintain SpO2 >90% Sascha Palma Pam Drown, PT Acute Rehabilitation Services Pager: [phone] Office: [phone]

## 2018-04-20 NOTE — Progress Notes (Signed)
HD tx initiated via 15G x 3 w/o problem but pt is very hard stick Pull/push/flush well w/o problem VSS Will continue to monitor while on HD tx

## 2018-04-20 NOTE — Progress Notes (Signed)
Patient went to dialysis via bed with chart, still wheezing some, report given to [facility]

## 2018-04-20 NOTE — Progress Notes (Signed)
[facility] Progress Note   Subjective:  Sittiing up in bed. Sats 92% on RA. Feels a little better than yesterday, but still some wheezing, cough.  Had HD early this am. Net UF 3L    Objective Vitals:   04/20/18 0545 04/20/18 0615 04/20/18 0757 04/20/18 0852  BP: (!) 99/48 (!) 138/39    Pulse: (!) 59 76 71   Resp: 11 14 10    Temp:  97.8 F (36.6 C)    TempSrc:  Oral    SpO2: 100% 95% 100% 97%  Weight:  123.5 kg    Height:       Physical Exam General: Obese female NAD  Heart: distant RRR Lungs: scattered rhonchi throughout,  wheezing improved Abdomen: obese soft NT Extremities: thick legs no sig LE edema  Dialysis Access: RUA AVG +bruit    Dialysis Orders:  [facility]  4h 180NRe 400/1.5x EDW 121kg 2K/2.5Ca Prof 2 R AVG Hep 8000 U Mircera 50 mcg IV q 2 weeks (last 12/26) Calcitriol 0.75 mcg PO TIW   Assessment/Plan: 1. Dyspnea 2/2 HCAP and likely COPD exacerbation  - CXR with bilat lung opacities. On Cefepime/Vancomycin. Blood cultures pending. Meds per primary  2.  ESRD -  HD TTS.   Had HD early this am. K+ 6.3 pre HD. Recheck labs.  3.  Hypertension/volume  - BP controlled.Takes midodrine pre HD Net UF 3L post wt 123.5kg on 1/5.   4.  Anemia  - Hgb 9.7 On ESA as outpatient. Next dose due 1/9  5.  Metabolic bone disease -  Ca/Phos ok. Continue calcitriol. Tums binder TIDM 6.  DM type 2 -insulin per primary   Lynnda Child PA-C [facility] Pager [phone] 04/20/2018,10:59 AM  LOS: 1 day   Additional Objective Labs: Basic Metabolic Panel: Recent Labs  Lab 04/18/18 1624 04/19/18 0346 04/20/18 0130  NA 139 136 133*  K 5.9* 5.0 6.3*  CL 98 98 97*  CO2 30 28 24   GLUCOSE 185* 180* 208*  BUN 44* 51* 73*  CREATININE 8.32* 9.03* 10.97*  CALCIUM 8.6* 8.4* 8.1*  PHOS  --   --  5.5*   CBC: Recent Labs  Lab 04/18/18 1624 04/20/18 0130  WBC 12.2* 12.7*  HGB 9.7* 9.6*  HCT 34.0* 33.6*  MCV 94.2 92.8  PLT 162 168   Blood  Culture    Component Value Date/Time   SDES BLOOD LEFT ANTECUBITAL 04/19/2018 0853   SPECREQUEST  04/19/2018 0853    AEROBIC BOTTLE ONLY Blood Culture results may not be optimal due to an inadequate volume of blood received in culture bottles   CULT NO GROWTH < 24 HOURS 04/19/2018 0853   REPTSTATUS PENDING 04/19/2018 0853    Cardiac Enzymes: Recent Labs  Lab 04/19/18 0853  TROPONINI 0.08*   CBG: Recent Labs  Lab 04/19/18 1352 04/19/18 1750 04/19/18 1913 04/20/18 0054 04/20/18 0752  GLUCAP 186* 275* 298* 227* 145*   Iron Studies: No results for input(s): IRON, TIBC, TRANSFERRIN, FERRITIN in the last 72 hours. Lab Results  Component Value Date   INR 1.09 12/28/2014   INR 1.13 12/27/2014   INR 1.05 10/21/2014   Medications: . sodium chloride    . sodium chloride     . aspirin EC  81 mg Oral Q M,W,F,Su-1800  . atorvastatin  40 mg Oral q1800  . [START ON 04/22/2018] calcitRIOL  0.75 mcg Oral Q T,Th,Sa-HD  . calcium carbonate  2,000 mg Oral TID WC  . carvedilol  6.25 mg Oral 2 times per day on Sun Mon Wed Fri  . fluticasone  2 spray Each Nare Daily  . gabapentin  300 mg Oral Q M,W,F,Su-1800  . guaiFENesin  600 mg Oral BID  . heparin  5,000 Units Subcutaneous Q8H  . insulin aspart  0-9 Units Subcutaneous TID WC  . insulin detemir  55 Units Subcutaneous QPM  . ipratropium-albuterol  3 mL Nebulization Q6H  . loratadine  10 mg Oral Daily  . methylPREDNISolone (SOLU-MEDROL) injection  60 mg Intravenous Q8H  . [START ON 04/22/2018] oseltamivir  30 mg Oral Q T,Th,Sat-1800  . oxymetazoline  1 spray Each Nare BID  . pantoprazole  40 mg Oral Q M,W,F,Su-1800  . sodium chloride flush  3 mL Intravenous Q12H

## 2018-04-20 NOTE — Discharge Summary (Addendum)
Jordan [name] HUT:[id number] DOB: 1941-01-31 DOA: 04/18/2018  PCP: Leone Haven, MD  Admit date: 04/18/2018  Discharge date: 04/20/2018  Admitted From: Home  Disposition:  Home   Recommendations for Outpatient Follow-up:   Follow up with PCP in 1-2 weeks  PCP Please obtain BMP/CBC, 2 view CXR in 1week,  (see Discharge instructions)   PCP Please follow up on the following pending results:    Home Health: PT,RN   Equipment/Devices: None  Consultations: Renal Discharge Condition: Srable   CODE STATUS: Full   Diet Recommendation: Renal diet with 1.2 L/day total fluid restriction.     Chief Complaint  Patient presents with  . Shortness of Breath     Brief history of present illness from the day of admission and additional interim summary    Patient is a 78 y.o. female with history of ESRD on HD, COPD, DM-2, chronic diastolic heart failure-who presented with 5-day history of productive cough, chest congestion and worsening shortness of breath.  She just completed a prednisone taper and doxycycline as a outpatient with no significant relief of her symptoms.  She was evaluated in the emergency room, thought to have pneumonia and COPD exacerbation-and subsequently admitted to the hospitalist service.  See below for further details                                                                 Hospital Course   Acute hypoxic respiratory failure secondary to multifocal pneumonia and COPD exacerbation: This turned out to be flu positive, received a dose of Solu-Medrol and some empiric antibiotics as well, does have a left-sided infiltrate on chest x-ray and some patchy appearance on the right, noted reviewed chest x-rays from the past and the left-sided opacity seems somewhat chronic.  Clinically she is  afebrile, having breakfast without any distress, pulse ox at rest at the time of my examination on room air was 100%, she will be placed on Tamiflu per her renal function dose, since her imposed bacterial infection is not 100% ruled out will give her 4 days of doxycycline.  He is overall symptom-free, will be discharged with home PT, RN and PCP follow-up in a week.  Request PCP to check CBC and BMP along with a two-view chest x-ray in a week.  Since she had some wheezing upon admission and was placed on IV Solu-Medrol will give her a Medrol Dosepak taper.  Currently no wheezing at all.  Bilaterally clear lung fields.  Discharged home with PCP follow-up.  Addendum - Of note after discharge yesterday patient was ambulated on room air and she became short of breath and hypoxic, she qualified for home oxygen which was provided.  Upon further history taking patient said that she has been getting hypoxic or short of breath upon ambulating  for several months, I think this is a chronic issue, will discharge with home oxygen follow with PCP.  At rest she is completely symptom-free.   ESRD on HD TTS: Nephrology consulted-defer dialysis needs to nephrology.  Last dialysis was early morning 04/20/2018.  Note her hyperkalemia noted on the labs was prior to dialysis today.  Chronic diastolic heart failure: Volume status seems reasonably stable-diuresis with HD.  DM-2: CBG stable-continue Levemir and SSI.  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  Trend was flat and and non-ACS pattern.     Discharge diagnosis     Principal Problem:   HCAP (healthcare-associated pneumonia) Active Problems:   COPD (chronic obstructive pulmonary disease) (HCC)   Elevated troponin   ESRD (end stage renal  disease) on dialysis (HCC)   Type 2 diabetes mellitus with diabetic nephropathy (HCC)   Systolic CHF, chronic (HCC)   Leukocytosis    Discharge instructions    Discharge Instructions    Discharge instructions   Complete by:  As directed    Follow with Primary MD Leone Haven, MD in 5 days   Get CBC, BMP, 2 view Chest X ray  checked  by Primary MD  in 5 days   Activity: As tolerated with Full fall precautions use walker/cane & assistance as needed  Disposition Home   Diet: Heart Healthy with 1.2 L/day total fluid restriction.  Special Instructions: If you have smoked or chewed Tobacco  in the last 2 yrs please stop smoking, stop any regular Alcohol  and or any Recreational drug use.  On your next visit with your primary care physician please Get Medicines reviewed and adjusted.  Please request your Prim.MD to go over all Hospital Tests and Procedure/Radiological results at the follow up, please get all Hospital records sent to your Prim MD by signing hospital release before you go home.  If you experience worsening of your admission symptoms, develop shortness of breath, life threatening emergency, suicidal or homicidal thoughts you must seek medical attention immediately by calling 911 or calling your MD immediately  if symptoms less severe.  You Must read complete instructions/literature along with all the possible adverse reactions/side effects for all the Medicines you take and that have been prescribed to you. Take any new Medicines after you have completely understood and accpet all the possible adverse reactions/side effects.   Increase activity slowly   Complete by:  As directed       Discharge Medications   Allergies as of 04/20/2018      Reactions   Vicodin [hydrocodone-acetaminophen] Itching   Adhesive [tape] Itching, Rash, Other (See Comments)   Please use "paper" tape      Medication List    TAKE these medications   albuterol 108 (90 Base) MCG/ACT  inhaler Commonly known as:  PROVENTIL HFA;VENTOLIN HFA Inhale 2 puffs into the lungs every 6 (six) hours as needed for wheezing or shortness of breath.   aspirin EC 81 MG tablet Take 81 mg by mouth See admin instructions. Take one tablet (81 mg) by mouth every Monday, Wednesday, Friday and Sunday morning (non-dialysis days)   atorvastatin 40 MG tablet Commonly known as:  LIPITOR Take 1 tablet (40 mg total) by mouth daily at 6 PM.   carvedilol 6.25 MG tablet Commonly known as:  COREG Take 1 tablet (6.25 mg total)  by mouth See admin instructions. Take 6.25 mg by mouth twice daily on Monday, Wednesday, Friday and Sunday. What changed:  additional instructions   clotrimazole 1 % cream Commonly known as:  LOTRIMIN Apply 1 application topically 2 (two) times daily as needed (rash under breasts).   doxycycline 100 MG capsule Commonly known as:  VIBRAMYCIN Take 1 capsule (100 mg total) by mouth 2 (two) times daily.   gabapentin 300 MG capsule Commonly known as:  NEURONTIN Take 1 capsule (300 mg total) by mouth See admin instructions. Take one capsule (300 mg) by mouth every Monday, Wednesday, Friday and Sunday morning (non-dialysis days)   glucose blood test strip Commonly known as:  ONETOUCH VERIO Test once daily   Insulin Pen Needle 32G X 4 MM Misc Commonly known as:  BD PEN NEEDLE NANO U/F Use as directed   LEVEMIR FLEXTOUCH 100 UNIT/ML Pen Generic drug:  Insulin Detemir INJECT 55 UNITS INTO THE SKIN AT BEDTIME. What changed:  See the new instructions.   meclizine 25 MG tablet Commonly known as:  ANTIVERT Take 1 tablet (25 mg total) by mouth 3 (three) times daily as needed for dizziness.   methylPREDNISolone 4 MG Tbpk tablet Commonly known as:  MEDROL DOSEPAK follow package directions   midodrine 5 MG tablet Commonly known as:  PROAMATINE Take 5 mg by mouth See admin instructions. Take one tablet (5 mg) by mouth during dialysis (Tuesday, Thursday, Saturday) as needed for  low blood pressure <79/30   multivitamin with minerals Tabs tablet Take 1 tablet by mouth See admin instructions. Take one tablet by mouth every Monday, Wednesday, Friday and Sunday morning (non-dialysis days) - Women's fusion   omeprazole 20 MG capsule Commonly known as:  PRILOSEC Take 20 mg by mouth See admin instructions. Take one capsule (20 mg) by mouth on Monday, Wednesday, Friday and Sunday morning (non-dialysis days)   onetouch ultrasoft lancets Use to check blood sugars 2-3 times daily as directed.   oseltamivir 30 MG capsule Commonly known as:  TAMIFLU Take 1 capsule (30 mg total) by mouth every Tuesday, Thursday, and Saturday at 6 PM. Start taking on:  April 22, 2018   TUMS ULTRA 1000 PO Take 1,000 mg by mouth 3 (three) times daily with meals.       Follow-up Information    Leone Haven, MD. Schedule an appointment as soon as possible for a visit in 1 week(s).   Specialty:  Family Medicine Contact information: [address] [place] [address] [phone]           Major procedures and Radiology Reports - PLEASE review detailed and final reports thoroughly  -       Dg Chest 2 View  Result Date: 04/18/2018 CLINICAL DATA:  Productive cough, congestion, shortness of breath. EXAM: CHEST - 2 VIEW COMPARISON:  04/02/2018 FINDINGS: The cardiac silhouette remains enlarged. Aortic atherosclerosis is noted. There is new abnormal airspace opacity laterally in the left mid lung, and there is milder patchy and streaky opacity in the left lung base. New mild right basilar opacity is also present. No pleural effusion or pneumothorax is identified. No acute osseous abnormality is seen. A vascular stent is noted in the right axillary region. IMPRESSION: Worsening left greater than right lung opacities concerning for pneumonia. Electronically Signed   By: Logan Bores M.D.   On: 04/18/2018 17:13   Dg Chest 2 View  Result Date: 04/03/2018 CLINICAL DATA:   Shortness of breath with exertion, wheezing EXAM:  CHEST - 2 VIEW COMPARISON:  Chest x-ray of 03/24/2017 FINDINGS: Moderate cardiomegaly remains and there is some opacity at the left lung base but no definite pneumonia or effusion is noted on the lateral view. This may be due to cardiomegaly although scarring is a consideration. Very mild pulmonary vascular congestion could not be excluded. No bony abnormality is seen. IMPRESSION: 1. Stable moderate cardiomegaly. And exclude mild pulmonary vascular congestion. 2. Opacity at the left lung base most likely is chronic, possibly due to cardiomegaly and/or scarring. Electronically Signed   By: Ivar Drape M.D.   On: 04/03/2018 08:56   Dg Chest 2 View  Result Date: 03/24/2018 CLINICAL DATA:  Productive cough EXAM: CHEST - 2 VIEW COMPARISON:  12/23/2014 FINDINGS: Cardiomegaly with central vascular congestion. No focal airspace disease. No pneumothorax. IMPRESSION: 1. Cardiomegaly with central vascular congestion. Electronically Signed   By: Donavan Foil M.D.   On: 03/24/2018 15:33   Dg Chest Port 1 View  Result Date: 04/20/2018 CLINICAL DATA:  Short of breath. EXAM: PORTABLE CHEST 1 VIEW COMPARISON:  04/18/2018 FINDINGS: Moderate cardiac enlargement. Moderate left pleural effusion identified. Persistent left midlung airspace opacity. New opacity identified within the right lung base. IMPRESSION: 1. Left pleural effusion. 2. Persistent left midlung opacity with new right base airspace opacity. Electronically Signed   By: Kerby Moors M.D.   On: 04/20/2018 08:39    Micro Results     Recent Results (from the past 240 hour(s))  Blood culture (routine x 2)     Status: None (Preliminary result)   Collection Time: 04/19/18  4:05 AM  Result Value Ref Range Status   Specimen Description BLOOD RIGHT HAND  Final   Special Requests   Final    BOTTLES DRAWN AEROBIC ONLY Blood Culture results may not be optimal due to an inadequate volume of blood received in  culture bottles Performed at [facility] [address], [place], [place] [address]    Culture NO GROWTH 1 DAY  Final   Report Status PENDING  Incomplete  Culture, sputum-assessment     Status: None   Collection Time: 04/19/18  5:06 AM  Result Value Ref Range Status   Specimen Description EXPECTORATED SPUTUM  Final   Special Requests   Final    NONE Performed at [facility], [place] [address] [place], [place] [address]    Sputum evaluation THIS SPECIMEN IS ACCEPTABLE FOR SPUTUM CULTURE  Final   Report Status 04/20/2018 FINAL  Final  Culture, respiratory     Status: None (Preliminary result)   Collection Time: 04/19/18  5:06 AM  Result Value Ref Range Status   Specimen Description EXPECTORATED SPUTUM  Final   Special Requests NONE Reflexed from [id number]  Final   Gram Stain   Final    MODERATE WBC PRESENT, PREDOMINANTLY PMN FEW GRAM POSITIVE COCCI FEW GRAM POSITIVE RODS Performed at [facility], [address], [place], [place] [address]    Culture PENDING  Incomplete   Report Status PENDING  Incomplete  Blood culture (routine x 2)     Status: None (Preliminary result)   Collection Time: 04/19/18  8:53 AM  Result Value Ref Range Status   Specimen Description BLOOD LEFT ANTECUBITAL  Final   Special Requests   Final    AEROBIC BOTTLE ONLY Blood Culture results may not be optimal due to an inadequate volume of blood received in culture bottles   Culture NO GROWTH < 24 HOURS  Final  Report Status PENDING  Incomplete    Today   Subjective    Jordan [name] today has no headache,no chest abdominal pain,no new weakness tingling or numbness, feels much better wants to go home today.    Objective   Blood pressure (!) 138/39, pulse 71, temperature 97.8 F (36.6 C), temperature source Oral, resp. rate 10, height 5\' 5"  (1.651 m), weight 123.5 kg, SpO2 97 % on RA.   Intake/Output Summary (Last 24 hours) at 04/20/2018 1012 Last data filed at 04/20/2018 0545 Gross  per 24 hour  Intake 540 ml  Output 3002 ml  Net -2462 ml    Exam  Awake Alert, Oriented x 3, No new F.N deficits, Normal affect [place].AT,PERRAL Supple Neck,No JVD, No cervical lymphadenopathy appriciated.  Symmetrical Chest wall movement, Good air movement bilaterally, CTAB RRR,No Gallops,Rubs or new Murmurs, No Parasternal Heave +ve B.Sounds, Abd Soft, Non tender, No organomegaly appriciated, No rebound -guarding or rigidity. No Cyanosis, Clubbing or edema, No new Rash or bruise   Data Review   CBC w Diff:  Lab Results  Component Value Date   WBC 12.7 (H) 04/20/2018   HGB 9.6 (L) 04/20/2018   HCT 33.6 (L) 04/20/2018   PLT 168 04/20/2018   LYMPHOPCT 10 (L) 10/25/2014   MONOPCT 7 10/25/2014   EOSPCT 2 10/25/2014   BASOPCT 0 10/25/2014    CMP:  Lab Results  Component Value Date   NA 133 (L) 04/20/2018   K 6.3 (HH) 04/20/2018   CL 97 (L) 04/20/2018   CO2 24 04/20/2018   BUN 73 (H) 04/20/2018   CREATININE 10.97 (H) 04/20/2018   PROT 6.9 10/12/2014   ALBUMIN 3.0 (L) 04/20/2018   BILITOT 0.2 (L) 10/12/2014   ALKPHOS 101 10/12/2014   AST 59 (H) 10/12/2014   ALT 37 10/12/2014  .   Total Time in preparing paper work, data evaluation and todays exam - 63 minutes  Lala Lund M.D on 04/20/2018 at 10:12 AM  [facility]   Office  [phone]

## 2018-04-21 ENCOUNTER — Ambulatory Visit: Payer: Medicare Other | Admitting: Family Medicine

## 2018-04-21 LAB — GLUCOSE, CAPILLARY
Glucose-Capillary: 178 mg/dL — ABNORMAL HIGH (ref 70–99)
Glucose-Capillary: 241 mg/dL — ABNORMAL HIGH (ref 70–99)

## 2018-04-21 LAB — RENAL FUNCTION PANEL
Albumin: 2.8 g/dL — ABNORMAL LOW (ref 3.5–5.0)
Anion gap: 10 (ref 5–15)
BUN: 61 mg/dL — ABNORMAL HIGH (ref 8–23)
CO2: 27 mmol/L (ref 22–32)
Calcium: 8.3 mg/dL — ABNORMAL LOW (ref 8.9–10.3)
Chloride: 96 mmol/L — ABNORMAL LOW (ref 98–111)
Creatinine, Ser: 8.65 mg/dL — ABNORMAL HIGH (ref 0.44–1.00)
GFR calc Af Amer: 5 mL/min — ABNORMAL LOW (ref >60)
GFR calc non Af Amer: 4 mL/min — ABNORMAL LOW (ref >60)
GLUCOSE: 198 mg/dL — AB (ref 70–99)
Phosphorus: 4.8 mg/dL — ABNORMAL HIGH (ref 2.5–4.6)
Potassium: 5.7 mmol/L — ABNORMAL HIGH (ref 3.5–5.1)
SODIUM: 133 mmol/L — AB (ref 135–145)

## 2018-04-21 NOTE — Care Management Note (Signed)
Case Management Note  Patient Details  Name: Jordan [name] MRN: [id number] Date of Birth: 1941-01-25  Subjective/Objective:   Pt presented for HCAP- PTA from home with support of daughter. Patient was previously active with [facility] and wants to utilize them again.               Action/Plan: CM did call Tiffany with [facility] to begin within 24-48 hours post transition home. Pt qualifies for home 02 and she wants to utilize [facility] for DME 02. DME will be delivered to room prior to transition home. Patient's concentrator will need to be delivered to the home prior to transition home. No further needs from CM at this time.   Expected Discharge Date:  04/21/18               Expected Discharge Plan:  [place]  In-House Referral:  NA  Discharge planning Services  CM Consult  Post Acute Care Choice:  Home Health, Resumption of Svcs/PTA Provider, Durable Medical Equipment Choice offered to:  Patient  DME Arranged:  Oxygen DME Agency:  [facility]  HH Arranged:  PT, OT [place] Agency:  [facility] ([facility]  Status of Service:  Completed, signed off  If discussed at [employer], dates discussed:    Additional Comments:  Bethena Roys, RN 04/21/2018, 10:48 AM

## 2018-04-21 NOTE — Progress Notes (Addendum)
[facility] Progress Note   Subjective:   Seen and examined at bedside. Reports breathing is improved.  Approved to be d/c on home O2 due to SOB/hypoxia with exertion. Denies CP, n/v/d, abdominal pain, fever and chills.   Objective Vitals:   04/20/18 2053 04/21/18 0500 04/21/18 0852 04/21/18 1131  BP: (!) 162/87 (!) 141/57  (!) 139/57  Pulse: 78 62 68 64  Resp: (!) 21 (!) 25 14 14   Temp: 98.6 F (37 C) 98.6 F (37 C)  98.2 F (36.8 C)  TempSrc: Oral Oral  Oral  SpO2: 94% 98% 95% 96%  Weight:      Height:       Physical Exam General:NAD, pleasant obese female Heart:RRR Lungs:mostly CTAB, +scattered rhonchi cleared with cough Abdomen:obese, NTND Extremities:1+ LE edema R>L Dialysis Access: RU AVG +b/t   Filed Weights   04/19/18 2019 04/20/18 0136 04/20/18 0615  Weight: 126.5 kg 126.5 kg 123.5 kg    Intake/Output Summary (Last 24 hours) at 04/21/2018 1344 Last data filed at 04/21/2018 0900 Gross per 24 hour  Intake 720 ml  Output -  Net 720 ml    Additional Objective Labs: Basic Metabolic Panel: Recent Labs  Lab 04/19/18 0346 04/20/18 0130 04/21/18 0653  NA 136 133* 133*  K 5.0 6.3* 5.7*  CL 98 97* 96*  CO2 28 24 27   GLUCOSE 180* 208* 198*  BUN 51* 73* 61*  CREATININE 9.03* 10.97* 8.65*  CALCIUM 8.4* 8.1* 8.3*  PHOS  --  5.5* 4.8*   Liver Function Tests: Recent Labs  Lab 04/20/18 0130 04/21/18 0653  ALBUMIN 3.0* 2.8*   CBC: Recent Labs  Lab 04/18/18 1624 04/20/18 0130  WBC 12.2* 12.7*  HGB 9.7* 9.6*  HCT 34.0* 33.6*  MCV 94.2 92.8  PLT 162 168   Blood Culture    Component Value Date/Time   SDES BLOOD LEFT ANTECUBITAL 04/19/2018 0853   SPECREQUEST  04/19/2018 0853    AEROBIC BOTTLE ONLY Blood Culture results may not be optimal due to an inadequate volume of blood received in culture bottles   CULT  04/19/2018 0853    NO GROWTH 1 DAY Performed at [facility], [address] [place], [place] [address]    REPTSTATUS  PENDING 04/19/2018 1937    Cardiac Enzymes: Recent Labs  Lab 04/19/18 0853  TROPONINI 0.08*   CBG: Recent Labs  Lab 04/20/18 1143 04/20/18 1645 04/20/18 2102 04/21/18 0756 04/21/18 1130  GLUCAP 264* 281* 200* 178* 241*   Lab Results  Component Value Date   INR 1.09 12/28/2014   INR 1.13 12/27/2014   INR 1.05 10/21/2014   Studies/Results: Dg Chest Port 1 View  Result Date: 04/20/2018 CLINICAL DATA:  Short of breath. EXAM: PORTABLE CHEST 1 VIEW COMPARISON:  04/18/2018 FINDINGS: Moderate cardiac enlargement. Moderate left pleural effusion identified. Persistent left midlung airspace opacity. New opacity identified within the right lung base. IMPRESSION: 1. Left pleural effusion. 2. Persistent left midlung opacity with new right base airspace opacity. Electronically Signed   By: Kerby Moors M.D.   On: 04/20/2018 08:39    Medications: . sodium chloride    . sodium chloride     . aspirin EC  81 mg Oral Q M,W,F,Su-1800  . atorvastatin  40 mg Oral q1800  . [START ON 04/22/2018] calcitRIOL  0.75 mcg Oral Q T,Th,Sa-HD  . calcium carbonate  2,000 mg Oral TID WC  . carvedilol  6.25 mg Oral 2 times per day on Sun Mon  Wed Fri  . doxycycline  100 mg Oral Q12H  . fluticasone  2 spray Each Nare Daily  . gabapentin  300 mg Oral Q M,W,F,Su-1800  . guaiFENesin  600 mg Oral BID  . heparin  5,000 Units Subcutaneous Q8H  . insulin aspart  0-9 Units Subcutaneous TID WC  . insulin detemir  55 Units Subcutaneous QHS  . ipratropium-albuterol  3 mL Nebulization Q6H  . loratadine  10 mg Oral Daily  . methylPREDNISolone (SOLU-MEDROL) injection  60 mg Intravenous Q8H  . [START ON 04/22/2018] oseltamivir  30 mg Oral Q T,Th,Sat-1800  . oxymetazoline  1 spray Each Nare BID  . pantoprazole  40 mg Oral Q M,W,F,Su-1800  . sodium chloride flush  3 mL Intravenous Q12H    Dialysis Orders: [facility]  4h 180NRe 400/1.5x EDW 121kg 2K/2.5Ca Prof 2 R AVG Hep 8000 U Mircera 50 mcg IV q 2 weeks (last  12/26) Calcitriol 0.75 mcg PO TIW  Assessment/Plan: 1. Dyspnea 2/2 HCAP/influenza B positive - Improved. CXR showed b/l lung opacities. BC NGTD.  On Oral doxy and oseltamivir. 2. ESRD - On HD TTS.  HD tomorrow per regular schedule as OP. K 5.7. Stable for d/c from renal standpoint 3. Anemia of CKD- Hgb 9/6.  ESA due on 04/24/18.  4. Secondary hyperparathyroidism - Ca and phos in goal. Continue VDRA, binders.  5. HTN/volume - BP ok.  Takes midodrine pre HD.  Remains 2L over EDW, respiratory status at baseline.   6. Nutrition - Renal diet w/fluid restrictions.  7. DMT2 - insulin per primary.   Jen Mow, PA-C [facility] Pager: [phone] 04/21/2018,1:44 PM  LOS: 2 days   Pt seen, examined and agree w A/P as above.  Kelly Splinter MD [employer] pager [phone]   04/21/2018, 3:28 PM

## 2018-04-21 NOTE — Progress Notes (Signed)
[facility]                                                                                                                                                                         Patient Demographics  Jordan [name], is a 78 y.o. female  TDD:[id number]  WCB:[id number]  DOB - November 14, 1940  Admit date - 04/18/2018  Admitting Physician Norval Morton, MD  Outpatient Primary MD for the patient is Caryl Bis Angela Adam, MD  LOS - 2   Chief Complaint  Patient presents with  . Shortness of Breath        Assessment & Plan    Patient seen briefly today due for discharge soon per Discharge done yesterday by me, no further issues, Vital signs stable, patient feels fine.  Of note after discharge yesterday patient was ambulated on room air and she became short of breath and hypoxic, she qualified for home oxygen which was provided.  Upon further history taking patient said that she has been getting hypoxic or short of breath upon ambulating for several months, I think this is a chronic issue, will discharge with home oxygen follow with PCP.  At rest she is completely symptom-free.    Medications  Scheduled Meds: . aspirin EC  81 mg Oral Q M,W,F,Su-1800  . atorvastatin  40 mg Oral q1800  . [START ON 04/22/2018] calcitRIOL  0.75 mcg Oral Q T,Th,Sa-HD  . calcium carbonate  2,000 mg Oral TID WC  . carvedilol  6.25 mg Oral 2 times per day on Sun Mon Wed Fri  . doxycycline  100 mg Oral Q12H  . fluticasone  2 spray Each Nare Daily  . gabapentin  300 mg Oral Q M,W,F,Su-1800  . guaiFENesin  600 mg Oral BID  . heparin  5,000 Units Subcutaneous Q8H  . insulin aspart  0-9 Units Subcutaneous TID WC  . insulin detemir  55 Units Subcutaneous QHS  . ipratropium-albuterol  3 mL Nebulization Q6H  . loratadine  10 mg Oral Daily  . methylPREDNISolone (SOLU-MEDROL) injection  60 mg Intravenous Q8H  . [START ON 04/22/2018]  oseltamivir  30 mg Oral Q T,Th,Sat-1800  . oxymetazoline  1 spray Each Nare BID  . pantoprazole  40 mg Oral Q M,W,F,Su-1800  . sodium chloride flush  3 mL Intravenous Q12H   Continuous Infusions: . sodium chloride    . sodium chloride     PRN Meds:.sodium chloride, sodium chloride, acetaminophen **OR** acetaminophen, albuterol, alteplase, benzonatate, heparin, lidocaine (PF), lidocaine-prilocaine, meclizine, midodrine, ondansetron **OR** ondansetron (ZOFRAN) IV, pentafluoroprop-tetrafluoroeth    Time Spent in minutes   10 minutes   Lala Lund M.D on 04/21/2018 at 9:47 AM  Between 7am to 7pm - Pager - [phone]  After  7pm go to [email or web address] - password [email or web address]  And look for the night coverage person covering for me after hours  [facility] Office  [phone]    Subjective:   Jordan [name] today has, No headache, No chest pain, No abdominal pain - No Nausea, No new weakness tingling or numbness, No Cough - SOB.  Objective:   Vitals:   04/20/18 2020 04/20/18 2053 04/21/18 0500 04/21/18 0852  BP:  (!) 162/87 (!) 141/57   Pulse:  78 62 68  Resp:  (!) 21 (!) 25 14  Temp:  98.6 F (37 C) 98.6 F (37 C)   TempSrc:  Oral Oral   SpO2: 98% 94% 98% 95%  Weight:      Height:        Wt Readings from Last 3 Encounters:  04/20/18 123.5 kg  04/02/18 124.6 kg  03/24/18 126.1 kg     Intake/Output Summary (Last 24 hours) at 04/21/2018 0947 Last data filed at 04/20/2018 2200 Gross per 24 hour  Intake 600 ml  Output -  Net 600 ml    Exam Awake Alert, Oriented X 3, No new F.N deficits, Normal affect [place].AT,PERRAL Supple Neck,No JVD, No cervical lymphadenopathy appriciated.  Symmetrical Chest wall movement, Good air movement bilaterally, CTAB RRR,No Gallops,Rubs or new Murmurs, No Parasternal Heave +ve B.Sounds, Abd Soft, Non tender, No organomegaly appriciated, No rebound - guarding or rigidity. No Cyanosis, Clubbing or edema, No new Rash or bruise      Data Review

## 2018-04-22 ENCOUNTER — Telehealth: Payer: Self-pay

## 2018-04-22 DIAGNOSIS — N186 End stage renal disease: Secondary | ICD-10-CM

## 2018-04-22 DIAGNOSIS — J189 Pneumonia, unspecified organism: Secondary | ICD-10-CM

## 2018-04-22 DIAGNOSIS — Z992 Dependence on renal dialysis: Secondary | ICD-10-CM

## 2018-04-22 LAB — CULTURE, RESPIRATORY W GRAM STAIN: Culture: NORMAL

## 2018-04-22 NOTE — Telephone Encounter (Signed)
Transition Care Management Follow-up Telephone Call   Date discharged? 04/21/18   How have you been since you were released from the hospital? Feeling better.  Appetite is good. Using oxygen as needed set at 2L.  Denies cough, use of tobacco products, chest pain.    Do you understand why you were in the hospital? Pneumonia. Dyspnea.    Do you understand the discharge instructions? Yes, fluid restriction 1.2 L, BMP/CBC, 2 view CXR in 1 week.    Where were you discharged to? Home.   Items Reviewed:  Medications reviewed: Taking all scheduled medications without issues.  Allergies reviewed: Yes.  Dietary changes reviewed: Yes, heart healthy.  Referrals reviewed: Yes.   Functional Questionnaire:   Activities of Daily Living (ADLs):   She states they are independent in the following: bathing, cooking, cleaning, self feeds, toileting, grooming.  States they require assistance with the following: ambulates with walker, oxygen set at 2L   Any transportation issues/concerns?: Daughter drives her to appointments.   Any patient concerns? None.   Confirmed importance and date/time of follow-up visits scheduled Yes, scheduled 04/29/18 at 11:30.  Provider Appointment booked with Dr. Caryl Bis, pcp.  Confirmed with patient if condition begins to worsen call PCP or go to the ER.  Patient was given the office number and encouraged to call back with question or concerns.  : Yes.

## 2018-04-23 ENCOUNTER — Telehealth: Payer: Self-pay

## 2018-04-23 NOTE — Telephone Encounter (Addendum)
Called to make patient aware labs and Xray are ordered and she may have these completed 04/28/18. No answer. Left a voicemail requesting her to call back and schedule lab appointment on this day, if she chooses. Xray to be completed same day as labs; no additional appointment needed. Keep hospital follow up appointment on 04/29/18. HIPAA compliant.   OK for [facility] RN to read this note to her and schedule lab appointment as appropriate.   She will not need to fast for this lab; anytime on 04/28/18 at her convenience to schedule is fine. She may also wait and complete both labs and Xray the following day during scheduled appointment on 04/29/18, if she chooses.

## 2018-04-23 NOTE — Telephone Encounter (Signed)
Noted.  Will make patient aware.

## 2018-04-23 NOTE — Telephone Encounter (Signed)
Orders for CBC and BMP placed.  Chest x-ray ordered.  She can have these done on 04/28/2018 or we can complete them on the day of her office visit.

## 2018-04-23 NOTE — Telephone Encounter (Signed)
Noted  

## 2018-04-23 NOTE — Telephone Encounter (Signed)
Copied from [place] [id number]. Topic: General - Other >> Apr 23, 2018 11:40 AM Yvette Rack wrote: Reason for CRM: Jenny Reichmann with [facility] stated pt appt had to be rescheduled for 04/25/18 due to pt having dialysis. This will cause the PT and OT evaluation to be scheduled for next week. Cb# [phone]

## 2018-04-23 NOTE — Telephone Encounter (Signed)
Sent to PCP ?

## 2018-04-23 NOTE — Telephone Encounter (Signed)
Copied from [place] [id number]. Topic: General - Other >> Apr 23, 2018 11:19 AM Carolyn Stare wrote:  FYI;[facility] will be going to see the picture on 04/24/2018 to get her started with there service

## 2018-04-23 NOTE — Addendum Note (Signed)
Addended by: Leone Haven on: 04/23/2018 12:37 PM   Modules accepted: Orders

## 2018-04-24 LAB — CULTURE, BLOOD (ROUTINE X 2)
Culture: NO GROWTH
Culture: NO GROWTH

## 2018-04-29 ENCOUNTER — Other Ambulatory Visit: Payer: Self-pay | Admitting: Family Medicine

## 2018-04-29 ENCOUNTER — Ambulatory Visit (INDEPENDENT_AMBULATORY_CARE_PROVIDER_SITE_OTHER): Payer: Medicare Other

## 2018-04-29 ENCOUNTER — Telehealth: Payer: Self-pay | Admitting: *Deleted

## 2018-04-29 ENCOUNTER — Encounter: Payer: Self-pay | Admitting: Family Medicine

## 2018-04-29 ENCOUNTER — Ambulatory Visit (INDEPENDENT_AMBULATORY_CARE_PROVIDER_SITE_OTHER): Payer: Medicare Other | Admitting: Family Medicine

## 2018-04-29 VITALS — BP 130/80 | HR 75 | Temp 98.8°F | Ht 65.0 in | Wt 279.8 lb

## 2018-04-29 DIAGNOSIS — N186 End stage renal disease: Secondary | ICD-10-CM

## 2018-04-29 DIAGNOSIS — J189 Pneumonia, unspecified organism: Secondary | ICD-10-CM | POA: Diagnosis not present

## 2018-04-29 DIAGNOSIS — Z992 Dependence on renal dialysis: Secondary | ICD-10-CM

## 2018-04-29 DIAGNOSIS — F320 Major depressive disorder, single episode, mild: Secondary | ICD-10-CM

## 2018-04-29 NOTE — Assessment & Plan Note (Signed)
Appears to be recovering quite well.  Discussed that her symptoms will likely continue to improve over the next several weeks.  She has completed treatment.  We will obtain a chest x-ray and CBC.  She will monitor for worsening of her symptoms.

## 2018-04-29 NOTE — Telephone Encounter (Signed)
Noted  

## 2018-04-29 NOTE — Assessment & Plan Note (Signed)
Mild in nature.  Likely reactive to her medical issues.  She declined medication.  She will monitor.

## 2018-04-29 NOTE — Patient Instructions (Signed)
Nice to see you. We will get an x-ray and lab work today. If you develop worsening breathing issues or cough productive of blood or fevers please be reevaluated.

## 2018-04-29 NOTE — Progress Notes (Signed)
[name], MD Phone: [phone]  Jordan [name] is a 78 y.o. female who presents today for follow-up.  CC: Healthcare associated pneumonia, influenza, depression  Patient was hospitalized from 04/19/2018-04/21/2018.  This was for cough, congestion, and shortness of breath.  She was felt to have pneumonia and was subsequently diagnosed with influenza as well.  She was treated with Tamiflu, doxycycline, and a Medrol Dosepak.  She has finished her medications.  She feels as though she is improving.  She reports she is about 70% better.  She is now on 2 L of oxygen.  She reports no shortness of breath.  Her cough is improving.  Cough is mildly productive of white mucus.  No chest pain.  Denies facial congestion.  No fevers.  She does report some depression related to not being able to do what she used to do.  This occurs some days per her report.  She notes no SI.  She declines medication for this.  She continues on dialysis Tuesday, Thursday, and Saturday.  Medications reviewed.  Discharge summary reviewed.  Social History   Tobacco Use  Smoking Status Former Smoker  Smokeless Tobacco Never Used     ROS see history of present illness  Objective  Physical Exam Vitals:   04/29/18 1131  BP: 130/80  Pulse: 75  Temp: 98.8 F (37.1 C)  SpO2: 93%  Brief ambulatory oxygen testing reveals an oxygen saturation of 90% without O2 on.  Ambulatory oxygen saturations goes to 96% with O2 on at 2 L.  BP Readings from Last 3 Encounters:  04/29/18 130/80  04/21/18 (!) 139/57  04/02/18 (!) 150/70   Wt Readings from Last 3 Encounters:  04/29/18 279 lb 12.8 oz (126.9 kg)  04/20/18 272 lb 4.3 oz (123.5 kg)  04/02/18 274 lb 9.6 oz (124.6 kg)    Physical Exam Constitutional:      General: She is not in acute distress.    Appearance: She is not diaphoretic.  Cardiovascular:     Rate and Rhythm: Normal rate and regular rhythm.     Heart sounds: Normal heart sounds.  Pulmonary:   Effort: Pulmonary effort is normal. No respiratory distress.     Breath sounds: No stridor. Wheezing (Faint scattered wheezing) present. No rales.  Musculoskeletal:     Right lower leg: No edema.     Left lower leg: No edema.  Skin:    General: Skin is warm and dry.  Neurological:     Mental Status: She is alert.      Assessment/Plan: Please see individual problem list.  HCAP (healthcare-associated pneumonia) Appears to be recovering quite well.  Discussed that her symptoms will likely continue to improve over the next several weeks.  She has completed treatment.  We will obtain a chest x-ray and CBC.  She will monitor for worsening of her symptoms.  Depression, major, single episode, mild (HCC) Mild in nature.  Likely reactive to her medical issues.  She declined medication.  She will monitor.    Orders Placed This Encounter  Procedures  . DG Chest 2 View    Standing Status:   Future    Number of Occurrences:   1    Standing Expiration Date:   06/28/2019    Order Specific Question:   Reason for Exam (SYMPTOM  OR DIAGNOSIS REQUIRED)    Answer:   hcap follow-up, improved symptoms, still mild cough    Order Specific Question:   Preferred imaging location?    Answer:  [facility] Specific Question:   Radiology Contrast Protocol - do NOT remove file path    Answer:   \\[email or web address].pdf  . CBC w/Diff  . Basic Metabolic Panel (BMET)    No orders of the defined types were placed in this encounter.    [name], MD [place]

## 2018-04-29 NOTE — Telephone Encounter (Signed)
I spoke with [facility] & they will draw her BMP & CBC on 05/08/2018. They will fax them over by 05/12/2018.   Their office number is: [phone]

## 2018-05-01 ENCOUNTER — Telehealth: Payer: Self-pay | Admitting: *Deleted

## 2018-05-01 ENCOUNTER — Other Ambulatory Visit: Payer: Self-pay | Admitting: Family Medicine

## 2018-05-01 DIAGNOSIS — J90 Pleural effusion, not elsewhere classified: Secondary | ICD-10-CM

## 2018-05-01 NOTE — Telephone Encounter (Signed)
Copied from [place] [id number]. Topic: Quick Communication - Home Health Verbal Orders >> May 01, 2018  9:17 AM Judyann Munson wrote: Caller/Agency: [place]- [facility]  Callback Number: [phone] Requesting OT/PT/Skilled Nursing/Social Work:  requesting verbal to start care tomorrow 05-02-2018, due to the patient wanting to delay care today.

## 2018-05-02 NOTE — Telephone Encounter (Signed)
Verbal given 

## 2018-05-05 ENCOUNTER — Ambulatory Visit: Payer: Self-pay

## 2018-05-05 ENCOUNTER — Other Ambulatory Visit: Payer: Self-pay | Admitting: Family Medicine

## 2018-05-05 NOTE — Telephone Encounter (Signed)
Copied from [place] [phone]. Topic: General - Other >> May 05, 2018  2:01 PM Lennox Solders wrote: Reason for CRM: pt saw dr Sallee Lange on 04-29-2018 and per pt she mention to the nurse she needs refills on meclizine and clotrimazole 1% cream. [facility] [place] [facility] [address]

## 2018-05-05 NOTE — Telephone Encounter (Signed)
Requested medication (s) are due for refill today: yes  Requested medication (s) are on the active medication list: yes  Last refill:  Meclizine  Not delegated  [place] provider Off protocol  Future visit scheduled: no last visit 05/01/2018  Notes to clinic:  See triage note 05/05/17    Requested Prescriptions  Pending Prescriptions Disp Refills   meclizine (ANTIVERT) 25 MG tablet 30 tablet 0    Sig: Take 1 tablet (25 mg total) by mouth 3 (three) times daily as needed for dizziness.     Not Delegated - Gastroenterology: Antiemetics Failed - 05/05/2018  2:48 PM      Failed - This refill cannot be delegated      Passed - Valid encounter within last 6 months    Recent Outpatient Visits          6 days ago HCAP (healthcare-associated pneumonia)   [place], Angela Adam, MD   1 month ago [place] Leone Haven, MD   1 month ago Cough   [facility] Leone Haven, MD   7 months ago Left eye complaint   [facility] [place], [place], [place]   1 year ago Paresthesia of left arm   [facility], Angela Adam, MD            clotrimazole (LOTRIMIN) 1 % cream 30 g 2    Sig: Apply 1 application topically 2 (two) times daily as needed (rash under breasts).     Off-Protocol Failed - 05/05/2018  2:48 PM      Failed - Medication not assigned to a protocol, review manually.      Passed - Valid encounter within last 12 months    Recent Outpatient Visits          6 days ago HCAP (healthcare-associated pneumonia)   [facility], Angela Adam, MD   1 month ago [place] Leone Haven, MD   1 month ago Cough   [facility] Leone Haven, MD   7 months ago Left eye complaint   [facility], [place], [place]   1 year ago  Paresthesia of left arm   [facility], Angela Adam, MD

## 2018-05-05 NOTE — Telephone Encounter (Signed)
Called pt who was requesting reorder of Antivert. Patient states that she has ocasional bouts of vertigo.  She say that sometimes if she gets up to quickly she will feel dizzy and sit back down.  She denies vomiting since the original episode. Pt has just seen Dr Caryl Bis 05/01/2018. No appointment scheduled  Reason for Disposition . MILD dizziness is a chronic symptom (recurrent or ongoing AND present > 4 weeks)    Pt states she has just had an office visit with Dr Caryl Bis  Answer Assessment - Initial Assessment Questions 1. DESCRIPTION: "Describe your dizziness."     Room spinning 2. VERTIGO: "Do you feel like either you or the room is spinning or tilting?"      spinniong 3. LIGHTHEADED: "Do you feel lightheaded?" (e.g., somewhat faint, woozy, weak upon standing)  sometimes dizzy if stands to quickly 4. SEVERITY: "How bad is it?"  "Can you walk?"   - MILD - Feels unsteady but walking normally.   - MODERATE - Feels very unsteady when walking, but not falling; interferes with normal activities (e.g., school, work) .   - SEVERE - Unable to walk without falling (requires assistance). Moderate     5. ONSET:  "When did the dizziness begin?"     3 weeks ago 6. AGGRAVATING FACTORS: "Does anything make it worse?" (e.g., standing, change in head position)     no 7. CAUSE: "What do you think is causing the dizziness?"     vertigo 8. RECURRENT SYMPTOM: "Have you had dizziness before?" If so, ask: "When was the last time?" "What happened that time?"     yes 9. OTHER SYMPTOMS: "Do you have any other symptoms?" (e.g., headache, weakness, numbness, vomiting, earache)     no 10. PREGNANCY: "Is there any chance you are pregnant?" "When was your last menstrual period?"      [name]  Protocols used: DIZZINESS - VERTIGO-A-AH

## 2018-05-06 MED ORDER — CLOTRIMAZOLE 1 % EX CREA: 1.0000 "application " | TOPICAL_CREAM | Freq: Two times a day (BID) | CUTANEOUS | 2 refills | Status: AC | PRN

## 2018-05-06 MED ORDER — MECLIZINE HCL 25 MG PO TABS: 25.0000 mg | ORAL_TABLET | Freq: Three times a day (TID) | ORAL | 0 refills | Status: DC | PRN

## 2018-05-06 NOTE — Telephone Encounter (Signed)
Sent to pharmacy 

## 2018-05-12 ENCOUNTER — Telehealth: Payer: Self-pay | Admitting: Family Medicine

## 2018-05-12 NOTE — Telephone Encounter (Signed)
Copied from [place] [id number]. Topic: Quick Communication - Home Health Verbal Orders >> May 12, 2018 11:44 AM Windy Kalata wrote: Caller/Agency: [place] from [place] [facility] Number: [phone] Requesting OT/PT/Skilled Nursing/Social Work: PT Frequency: twice a week for 5 weeks  Okay to leave message on secured vmail

## 2018-05-12 NOTE — Telephone Encounter (Signed)
Verbal given to [employer]

## 2018-05-13 NOTE — Progress Notes (Signed)
[facility] Consultation      Assessment and Plan:  Dyspnea on exertion.  - Patient's dyspnea is multifactorial secondary to below conditions. - Patient is a lifelong non-smoker, doubt that she has a significant element of obstructive lung disease.  Excessive daytime sleepiness.  -Symptoms and signs of obstructive sleep apnea. - We will send for sleep study.  Volume overload,  pleural effusion, end-stage renal disease.  - Patient has evidence of volume overload, small pleural effusions with mild pulmonary effusion on most recent chest x-ray while patient was in the hospital for acute volume overload. - Treatment is continue treatment of the underlying issues which includes treatment of diastolic dysfunction as well as end-stage renal disease with continued hemodialysis.  Atelectasis, obesity.  - Patient has an element of atelectasis and likely restrictive lung disease secondary to body habitus. -Weight loss is recommended.  Orders Placed This Encounter  Procedures  . Split night study   No follow-ups on file.    Date: 05/14/2018  MRN# [id number] Jamee Keach [name] 11-02-1940   Jordan [name] is a 78 y.o. old female seen in consultation for chief complaint of:    Chief Complaint  Patient presents with  . Consult    Referred by Dr. Caryl Bis for eval of cough thick white mucus, sob with exertion. She is on 2 liters with exertions. She has sl wheezing but denies chest tightness.    HPI:   Patient is a44 y.o.femalewith history of ESRD on HD, COPD, DM-2, chronic diastolic heart failure-who presented with 5-day history of productive cough, chest congestion and worsening shortness of breath. She was admitted to the hospital from 04/19/2018 to 1/6 with pneumonia and flu.  Currently she feels that her breathing is much better. She is using oxygen at 2Lpulse with activity. She is able to get around with some dyspnea, but it is significantly  better. She has steps going up into her house and she would be completely winded, but now she can do it.  She is not on an inhaler currently. She was tested for OSA "years ago" but then stopped.   **Desat walk 05/14/18>> Baseline sat at rest on RA is 98% and HR 76. Walked 180 feet at slow pace with walker. Severe dyspnea sat 94% and HR 102.   **Chest x-ray 04/29/2018>> imaging personally reviewed, changes of chronic bronchitis, cardiomegaly, left small pleural effusion, with evidence of volume overload.   PMHX:   Past Medical History:  Diagnosis Date  . Arthritis   . Breast cancer ([place])   . CAD (coronary artery disease)    a. minimal CAD by cath in 10/2014.  Marland Kitchen CKD (chronic kidney disease)   . COPD (chronic obstructive pulmonary disease) ([place])   . Diabetes mellitus with ESRD (end-stage renal disease) ([place])   . Enterococcal bacteremia 12/26/2014  . GERD (gastroesophageal reflux disease)   . Hypertension   . Renal disorder    Family reports acute renal failure  . Sleep apnea    does not wear CPAP  . Systolic CHF, chronic ([place]) 12/23/2014   Surgical Hx:  Past Surgical History:  Procedure Laterality Date  . ARTERIOVENOUS GRAFT PLACEMENT Right 10/05/15  . AV FISTULA PLACEMENT Right 10/26/2014   Procedure: Right Brachiocephalic Arteriovenous FISTULA CREATION;  Surgeon: Conrad Wallace, MD;  Location: [facility];  Service: Vascular;  Laterality: Right;  . AV FISTULA PLACEMENT Right 10/05/2015   Procedure: INSERTION OF  ARTERIOVENOUS (AV) GORE-TEX GRAFT RIGHT ARM;  Surgeon: Conrad La Villa,  MD;  Location: [facility];  Service: Vascular;  Laterality: Right;  . BASCILIC VEIN TRANSPOSITION Right 04/13/2015   Procedure: RIGHT FIRST STAGE BASCILIC VEIN TRANSPOSITION;  Surgeon: Conrad Allen, MD;  Location: [facility];  Service: Vascular;  Laterality: Right;  . BASCILIC VEIN TRANSPOSITION Right 07/11/2015   Procedure: SECOND STAGE BASILIC VEIN TRANSPOSITION;  Surgeon: Conrad Redbird Smith, MD;  Location: [facility];  Service: Vascular;   Laterality: Right;  . BREAST SURGERY Left   . CARDIAC CATHETERIZATION [name] 10/19/2014   Procedure: Right/Left Heart Cath and Coronary Angiography;  Surgeon: Peter M Martinique, MD;  Location: [facility];  Service: Cardiovascular;  Laterality: [name];  . FISTULA SUPERFICIALIZATION Right 01/26/2015   Procedure: Right BRACHIOCEPHALIC ARTERIOVENOUS FISTULA SUPERFICIALIZATION with side branch ligation;  Surgeon: Conrad Benzonia, MD;  Location: [facility];  Service: Vascular;  Laterality: Right;  . IR DIALY SHUNT INTRO [place] W/IMG RIGHT Right 08/23/2016  . IR GENERIC HISTORICAL  04/30/2016   IR REMOVAL TUN CV CATH W/O FL 04/30/2016 Aletta Edouard, MD [facility]  . IR US GUIDE VASC ACCESS RIGHT  08/23/2016  . LIGATION OF ARTERIOVENOUS  FISTULA Right 03/07/2015   Procedure: LIGATION OF RIGHT BRACHIOCEPHALIC ARTERIOVENOUS  FISTULA;  Surgeon: Conrad Marion, MD;  Location: [facility];  Service: Vascular;  Laterality: Right;  . LIGATION OF ARTERIOVENOUS  FISTULA Right 10/05/2015   Procedure: LIGATION OF RIGHT BASILIC VEIN TRANSPOSITION; EXCISION OF CICATRIX;  Surgeon: Conrad Ellsworth, MD;  Location: [facility];  Service: Vascular;  Laterality: Right;  . PERIPHERAL VASCULAR CATHETERIZATION [name] 01/24/2015   Procedure: Fistulagram;  Surgeon: Conrad Los Huisaches, MD;  Location: [facility];  Service: Cardiovascular;  Laterality: [name];  . PERIPHERAL VASCULAR CATHETERIZATION [name] 08/29/2015   Procedure: Fistulagram;  Surgeon: Conrad Jenkinsburg, MD;  Location: [facility];  Service: Cardiovascular;  Laterality: [name];  . PERIPHERAL VASCULAR CATHETERIZATION Right 08/29/2015   Procedure: Peripheral Vascular Balloon Angioplasty;  Surgeon: Conrad Broomtown, MD;  Location: [facility];  Service: Cardiovascular;  Laterality: Right;  arm fistula  . TEE WITHOUT CARDIOVERSION [name] 12/27/2014   Procedure: TRANSESOPHAGEAL ECHOCARDIOGRAM (TEE);  Surgeon: [place] Headings, MD;  Location: [facility];  Service: Cardiovascular;   Laterality: [name];  . THROMBECTOMY W/ EMBOLECTOMY Right 03/07/2015   Procedure: THROMBECTOMY OF RIGHT BRACHIOCEPHALIC ARTERIOVENOUS FISTULA;  Surgeon: Conrad St. Augustine, MD;  Location: [facility];  Service: Vascular;  Laterality: Right;  . THROMBECTOMY W/ EMBOLECTOMY Right 01/27/2016   Procedure: THROMBECTOMY AND REVISION OF RIGHT UPPER ARM ARTERIOVENOUS GORE-TEX GRAFT;  Surgeon: Angelia Mould, MD;  Location[facility];  Service: Vascular;  Laterality: Right;   Family Hx:  Family History  Problem Relation Age of Onset  . Diabetes Mother   . Hypertension Mother   . Stroke Sister   . Hypertension Sister   . Heart attack Neg Hx    Social Hx:   Social History   Tobacco Use  . Smoking status: Former Research scientist (life sciences)  . Smokeless tobacco: Never Used  Substance Use Topics  . Alcohol use: No    Alcohol/week: 0.0 standard drinks  . Drug use: No   Medication:    Current Outpatient Medications:  .  albuterol (PROVENTIL HFA;VENTOLIN HFA) 108 (90 Base) MCG/ACT inhaler, Inhale 2 puffs into the lungs every 6 (six) hours as needed for wheezing or shortness of breath., Disp: 1 Inhaler, Rfl: 0 .  aspirin EC 81 MG tablet, Take 81 mg by mouth See admin instructions. Take  one tablet (81 mg) by mouth every Monday, Wednesday, Friday and Sunday morning (non-dialysis days), Disp: , Rfl:  .  atorvastatin (LIPITOR) 40 MG tablet, TAKE 1 TABLET (40 MG TOTAL) BY MOUTH DAILY AT 6 PM., Disp: 30 tablet, Rfl: 1 .  Calcium Carbonate Antacid (TUMS ULTRA 1000 PO), Take 1,000 mg by mouth 3 (three) times daily with meals., Disp: , Rfl:  .  carvedilol (COREG) 6.25 MG tablet, Take 1 tablet (6.25 mg total) by mouth See admin instructions. Take 6.25 mg by mouth twice daily on Monday, Wednesday, Friday and Sunday. (Patient taking differently: Take 6.25 mg by mouth See admin instructions. Take one tablet ( 6.25 mg) by mouth twice daily on Monday, Wednesday, Friday and Sunday. (non-dialysis days)), Disp: 60 tablet, Rfl: 3 .  clotrimazole  (LOTRIMIN) 1 % cream, Apply 1 application topically 2 (two) times daily as needed (rash under breasts)., Disp: 30 g, Rfl: 2 .  gabapentin (NEURONTIN) 300 MG capsule, Take 1 capsule (300 mg total) by mouth See admin instructions. Take one capsule (300 mg) by mouth every Monday, Wednesday, Friday and Sunday morning (non-dialysis days), Disp: 48 capsule, Rfl: 1 .  glucose blood (ONETOUCH VERIO) test strip, Test once daily, Disp: 100 each, Rfl: 12 .  Insulin Pen Needle (BD PEN NEEDLE NANO U/F) 32G X 4 MM MISC, Use as directed, Disp: 100 each, Rfl: 5 .  Lancets (ONETOUCH ULTRASOFT) lancets, Use to check blood sugars 2-3 times daily as directed., Disp: 100 each, Rfl: 12 .  LEVEMIR FLEXTOUCH 100 UNIT/ML Pen, INJECT 55 UNITS INTO THE SKIN AT BEDTIME. (Patient taking differently: Inject 55 Units into the skin every evening. ), Disp: 15 pen, Rfl: 2 .  meclizine (ANTIVERT) 25 MG tablet, Take 1 tablet (25 mg total) by mouth 3 (three) times daily as needed for dizziness., Disp: 30 tablet, Rfl: 0 .  midodrine (PROAMATINE) 5 MG tablet, Take 5 mg by mouth See admin instructions. Take one tablet (5 mg) by mouth during dialysis (Tuesday, Thursday, Saturday) as needed for low blood pressure <79/30, Disp: , Rfl: 11 .  Multiple Vitamin (MULTIVITAMIN WITH MINERALS) TABS tablet, Take 1 tablet by mouth See admin instructions. Take one tablet by mouth every Monday, Wednesday, Friday and Sunday morning (non-dialysis days) - Women's fusion, Disp: , Rfl:  .  omeprazole (PRILOSEC) 20 MG capsule, Take 20 mg by mouth See admin instructions. Take one capsule (20 mg) by mouth on Monday, Wednesday, Friday and Sunday morning (non-dialysis days), Disp: , Rfl:    Allergies:  Vicodin [hydrocodone-acetaminophen] and Adhesive [tape]  Review of Systems: Gen:  Denies  fever, sweats, chills HEENT: Denies blurred vision, double vision. bleeds, sore throat Cvc:  No dizziness, chest pain. Resp:   Denies cough or sputum production, shortness  of breath Gi: Denies swallowing difficulty, stomach pain. Gu:  Denies bladder incontinence, burning urine Ext:   No Joint pain, stiffness. Skin: No skin rash,  hives  Endoc:  No polyuria, polydipsia. Psych: No depression, insomnia. Other:  All other systems were reviewed with the patient and were negative other that what is mentioned in the HPI.   Physical Examination:   VS: BP 140/82 (BP Location: Left Arm, Cuff Size: Large)   Pulse 96   Ht 5\' 5"  (1.651 m)   SpO2 95%   BMI 46.56 kg/m   General Appearance: No distress  Neuro:without focal findings,  speech normal,  HEENT: PERRLA, EOM intact.   Pulmonary: normal breath sounds, No wheezing.  CardiovascularNormal S1,S2.  No m/r/g.  Abdomen: Benign, Soft, non-tender. Renal:  No costovertebral tenderness  GU:  No performed at this time. Endoc: No evident thyromegaly, no signs of acromegaly. Skin:   warm, no rashes, no ecchymosis  Extremities: normal, no cyanosis, clubbing.  Other findings:    LABORATORY PANEL:   CBC No results for input(s): WBC, HGB, HCT, PLT in the last 168 hours. ------------------------------------------------------------------------------------------------------------------  Chemistries  No results for input(s): NA, K, CL, CO2, GLUCOSE, BUN, CREATININE, CALCIUM, MG, AST, ALT, ALKPHOS, BILITOT in the last 168 hours.  Invalid input(s): GFRCGP ------------------------------------------------------------------------------------------------------------------  Cardiac Enzymes No results for input(s): TROPONINI in the last 168 hours. ------------------------------------------------------------  RADIOLOGY:  No results found.     Thank  you for the consultation and for allowing [facility] to assist in the care of your patient. Our recommendations are noted above.  Please contact us if we can be of further service.   Marda Stalker, M.D., F.C.C.P.  Board Certified in  Internal Medicine, Pulmonary Medicine, [place], and Sleep Medicine.  [facility] Office Number: [phone]   05/14/2018

## 2018-05-14 ENCOUNTER — Telehealth: Payer: Self-pay | Admitting: Family Medicine

## 2018-05-14 ENCOUNTER — Encounter: Payer: Self-pay | Admitting: Internal Medicine

## 2018-05-14 ENCOUNTER — Ambulatory Visit (INDEPENDENT_AMBULATORY_CARE_PROVIDER_SITE_OTHER): Payer: Medicare Other | Admitting: Internal Medicine

## 2018-05-14 VITALS — BP 140/82 | HR 96 | Ht 65.0 in

## 2018-05-14 DIAGNOSIS — G4719 Other hypersomnia: Secondary | ICD-10-CM

## 2018-05-14 NOTE — Telephone Encounter (Signed)
Called pt and left a VM to call back. CRM created and sent to [facility] pool. 

## 2018-05-14 NOTE — Telephone Encounter (Signed)
Reviewed Dr. Ellen Henri note with patient regarding anemia. She has not heard from the nephrologist.  She has hemodialysis Tue/Thur/Sat.   Routing to PCP as requested.

## 2018-05-14 NOTE — Patient Instructions (Signed)
Will send for sleep study.  Recommend that you get an oxygen saturation monitor, use oxygen if your oxygen level is below 90%.    Sleep Apnea    Sleep apnea is disorder that affects a person's sleep. A person with sleep apnea has abnormal pauses in their breathing when they sleep. It is hard for them to get a good sleep. This makes a person tired during the day. It also can lead to other physical problems. There are three types of sleep apnea. One type is when breathing stops for a short time because your airway is blocked (obstructive sleep apnea). Another type is when the brain sometimes fails to give the normal signal to breathe to the muscles that control your breathing (central sleep apnea). The third type is a combination of the other two types.  HOME CARE   Take all medicine as told by your doctor.  Avoid alcohol, calming medicines (sedatives), and depressant drugs.  Try to lose weight if you are overweight. Talk to your doctor about a healthy weight goal.  Your doctor may have you use a device that helps to open your airway. It can help you get the air that you need. It is called a positive airway pressure (PAP) device.   MAKE SURE YOU:   Understand these instructions.  Will watch your condition.  Will get help right away if you are not doing well or get worse.  It may take approximately 1 month for you to get used to wearing her CPAP every night.  Be sure to work with your machine to get used to it, be patient, it may take time!  If you have trouble tolerating CPAP DO NOT RETURN YOUR MACHINE; Contact our office to see if we can help you tolerate the CPAP better first!

## 2018-05-14 NOTE — Telephone Encounter (Signed)
Please let the patient know that I received her labs from her nephrologist.  It appears that she remains anemic and is slightly more so than when she was in the hospital.  Please see if these lab results were relayed to her her from her nephrologist.  If so please find out if there was a plan put in place.  If these have not been relayed to her please let me know.

## 2018-05-15 NOTE — Telephone Encounter (Signed)
Left message for patient to return call to office, [place] nurse may advise .

## 2018-05-15 NOTE — Telephone Encounter (Signed)
The patient and see where she has dialysis and see if they can fax her nephrology notes to [place]. She may benefit from an injectable medication for her anemia that could be done through her nephrologist. I also think it may be worthwhile having her see a hematologist to determine if there is another cause for her anemia. Thanks.

## 2018-05-16 ENCOUNTER — Ambulatory Visit: Payer: Self-pay

## 2018-05-16 MED ORDER — BENZONATATE 200 MG PO CAPS: 200.0000 mg | ORAL_CAPSULE | Freq: Two times a day (BID) | ORAL | 0 refills | Status: DC | PRN

## 2018-05-16 NOTE — Telephone Encounter (Signed)
Left detailed message on voicemail.  

## 2018-05-16 NOTE — Telephone Encounter (Signed)
Tessalon sent to pharmacy. If she develops worsening wheezing, cough productive of blood, shortness of breath, or any worsening symptoms she should be reevaluated.

## 2018-05-16 NOTE — Telephone Encounter (Signed)
duplicate  This encounter was created in error - please disregard.

## 2018-05-16 NOTE — Telephone Encounter (Signed)
Call returned to patient.  She states she has just been to see Dr Caryl Bis post pneumonia. She is calling today to request something to help her with her cough.  She denies fever. She states she coughs up so much phlegm that it chokes her at times. Phlegm is clear white with out blood.  She says she is breathing ok and using her O2 as instructed.  She has been using Delsym for cough but it has not been effective. Her cough is worse at night.  She states she has a little wheezing at times. She states she has just seen a lung specialist who wants her to have a sleep study. Home care instructions read to patient. I explained that the phlegm she coughs up helps prevent pneumonia. Pt verbalized understanding of all instructions . Pt requesting medication to help with cough. Note will be routed to office.  Reason for Disposition . Cough  Answer Assessment - Initial Assessment Questions 1. ONSET: "When did the cough begin?"      Several weeks 2. SEVERITY: "How bad is the cough today?"      Worse in evening and chokes her she coughs so much 3. RESPIRATORY DISTRESS: "Describe your breathing."      Yes no problem Using oxygen  4. FEVER: "Do you have a fever?" If so, ask: "What is your temperature, how was it measured, and when did it start?"     no 5. SPUTUM: "Describe the color of your sputum" (clear, white, yellow, green)     Clear white 6. HEMOPTYSIS: "Are you coughing up any blood?" If so ask: "How much?" (flecks, streaks, tablespoons, etc.)     no 7. CARDIAC HISTORY: "Do you have any history of heart disease?" (e.g., heart attack, congestive heart failure)      CHF 8. LUNG HISTORY: "Do you have any history of lung disease?"  (e.g., pulmonary embolus, asthma, emphysema)     No bronchitis 9. PE RISK FACTORS: "Do you have a history of blood clots?" (or: recent major surgery, recent prolonged travel, bedridden)     no 10. OTHER SYMPTOMS: "Do you have any other symptoms?" (e.g., runny nose, wheezing,  chest pain)       Wheezes just a little 11. PREGNANCY: "Is there any chance you are pregnant?" "When was your last menstrual period?"       [name] 12. TRAVEL: "Have you traveled out of the country in the last month?" (e.g., travel history, exposures)       No  Protocols used: [place]

## 2018-05-16 NOTE — Addendum Note (Signed)
Addended by: Leone Haven on: 05/16/2018 04:26 PM   Modules accepted: Orders

## 2018-05-16 NOTE — Telephone Encounter (Signed)
Please advise 

## 2018-05-19 ENCOUNTER — Telehealth: Payer: Self-pay

## 2018-05-19 ENCOUNTER — Other Ambulatory Visit: Payer: Self-pay

## 2018-05-19 NOTE — Telephone Encounter (Signed)
Called and spoke with patient. Pt advised of her lab results. Asked her if nephrologist informed her of these results pt did NOT give me an answer to my question. Pt stated that she had her blood work done at [facility] and NOT by a nephrologist pt stated that she does NOT have a nephrologist doctor. Pt stated that if she will need repeat lab work she will need for it to be done at the [facility].   Pt stated that she is still dealing with a bad cough as well she went to pick the Rx for Tessalon and two pills cost her 30 dollars she can NOT afford this medication and would like another option.   Sent to PCP to advise   Nephrologist - is the kidney doctor pt does seem to understand that

## 2018-05-19 NOTE — Telephone Encounter (Signed)
Noted. Please contact [facility] [employer] and see if they can draw a repeat CBC this week. Please let the patient know that I would like to refer her to nephrology for further management and evaluation of her kidney disease and her anemia. I would also like to refer her to hematology for evaluation of her anemia. Please see if she has any blood in her stool or if she is bleeding from anywhere. Thanks.

## 2018-05-19 NOTE — Telephone Encounter (Signed)
Called pt back and left a VM to call back. CRM created and sent to [facility] pool.  Called [facility] [employer] [employer] and they stated that at their office they can check hemoglobin once weekly and CBC only once monthly. Will inform patient of this information and see if she is willing to have blood drawn at our office.

## 2018-05-20 ENCOUNTER — Telehealth: Payer: Self-pay | Admitting: Family Medicine

## 2018-05-20 NOTE — Telephone Encounter (Signed)
Sent to PCP for the OK for verbal orders  

## 2018-05-20 NOTE — Telephone Encounter (Signed)
Copied from [place] [id number]. Topic: Quick Communication - Home Health Verbal Orders >> May 20, 2018  8:39 AM Reyne Dumas L wrote: Caller/Agency: Kendrice from [facility] Number: [phone], [place] to leave a message Requesting OT/PT/Skilled Nursing/Social Work:  skilled nursing - sacral wound Frequency: 1x a week for 2 weeks, 2x a week for 4 weeks  Also requesting approval of treatment:  cleanse wound with wound cleanser and gause then apply hydrogel and silver algenate to wound bed then to cover with foam bordred dressing

## 2018-05-21 NOTE — Telephone Encounter (Signed)
Verbal orders can be given.  Given her sacral wound I would prefer if she would be evaluated in the office as well for this issue.

## 2018-05-21 NOTE — Telephone Encounter (Signed)
Called pt and left a VM to call back.  

## 2018-05-22 NOTE — Telephone Encounter (Signed)
Verbal order was given to Kendrice from [facility]. And she will advise the pt to be evaluated in office as well.

## 2018-05-26 NOTE — Telephone Encounter (Signed)
Error

## 2018-05-29 ENCOUNTER — Other Ambulatory Visit: Payer: Self-pay

## 2018-05-29 ENCOUNTER — Emergency Department (HOSPITAL_COMMUNITY)
Admission: EM | Admit: 2018-05-29 | Discharge: 2018-05-29 | Disposition: A | Discharge status: Home / Self Care | Payer: Medicare Other | Attending: Emergency Medicine | Admitting: Emergency Medicine

## 2018-05-29 ENCOUNTER — Encounter (HOSPITAL_COMMUNITY): Payer: Self-pay

## 2018-05-29 ENCOUNTER — Emergency Department (HOSPITAL_COMMUNITY): Payer: Medicare Other

## 2018-05-29 DIAGNOSIS — I132 Hypertensive heart and chronic kidney disease with heart failure and with stage 5 chronic kidney disease, or end stage renal disease: Secondary | ICD-10-CM | POA: Diagnosis not present

## 2018-05-29 DIAGNOSIS — Z794 Long term (current) use of insulin: Secondary | ICD-10-CM | POA: Insufficient documentation

## 2018-05-29 DIAGNOSIS — Z79899 Other long term (current) drug therapy: Secondary | ICD-10-CM | POA: Insufficient documentation

## 2018-05-29 DIAGNOSIS — Z7982 Long term (current) use of aspirin: Secondary | ICD-10-CM | POA: Diagnosis not present

## 2018-05-29 DIAGNOSIS — J449 Chronic obstructive pulmonary disease, unspecified: Secondary | ICD-10-CM | POA: Insufficient documentation

## 2018-05-29 DIAGNOSIS — I493 Ventricular premature depolarization: Secondary | ICD-10-CM | POA: Insufficient documentation

## 2018-05-29 DIAGNOSIS — E119 Type 2 diabetes mellitus without complications: Secondary | ICD-10-CM | POA: Insufficient documentation

## 2018-05-29 DIAGNOSIS — N186 End stage renal disease: Secondary | ICD-10-CM | POA: Insufficient documentation

## 2018-05-29 DIAGNOSIS — R002 Palpitations: Secondary | ICD-10-CM

## 2018-05-29 DIAGNOSIS — Z87891 Personal history of nicotine dependence: Secondary | ICD-10-CM | POA: Insufficient documentation

## 2018-05-29 DIAGNOSIS — I5032 Chronic diastolic (congestive) heart failure: Secondary | ICD-10-CM | POA: Insufficient documentation

## 2018-05-29 DIAGNOSIS — R062 Wheezing: Secondary | ICD-10-CM | POA: Diagnosis not present

## 2018-05-29 DIAGNOSIS — I499 Cardiac arrhythmia, unspecified: Secondary | ICD-10-CM | POA: Diagnosis present

## 2018-05-29 LAB — CBC WITH DIFFERENTIAL/PLATELET
Abs Immature Granulocytes: 0.07 10*3/uL (ref 0.00–0.07)
BASOS ABS: 0 10*3/uL (ref 0.0–0.1)
Basophils Relative: 0 %
Eosinophils Absolute: 0.1 10*3/uL (ref 0.0–0.5)
Eosinophils Relative: 1 %
HCT: 34.2 % — ABNORMAL LOW (ref 36.0–46.0)
Hemoglobin: 9.9 g/dL — ABNORMAL LOW (ref 12.0–15.0)
IMMATURE GRANULOCYTES: 1 %
Lymphocytes Relative: 8 %
Lymphs Abs: 0.9 10*3/uL (ref 0.7–4.0)
MCH: 26.8 pg (ref 26.0–34.0)
MCHC: 28.9 g/dL — ABNORMAL LOW (ref 30.0–36.0)
MCV: 92.4 fL (ref 80.0–100.0)
Monocytes Absolute: 1 10*3/uL (ref 0.1–1.0)
Monocytes Relative: 9 %
NEUTROS PCT: 81 %
NRBC: 0 % (ref 0.0–0.2)
Neutro Abs: 9.5 10*3/uL — ABNORMAL HIGH (ref 1.7–7.7)
Platelets: 189 10*3/uL (ref 150–400)
RBC: 3.7 MIL/uL — ABNORMAL LOW (ref 3.87–5.11)
RDW: 14.1 % (ref 11.5–15.5)
WBC: 11.6 10*3/uL — ABNORMAL HIGH (ref 4.0–10.5)

## 2018-05-29 LAB — BASIC METABOLIC PANEL
Anion gap: 14 (ref 5–15)
BUN: 13 mg/dL (ref 8–23)
CO2: 26 mmol/L (ref 22–32)
Calcium: 8.5 mg/dL — ABNORMAL LOW (ref 8.9–10.3)
Chloride: 96 mmol/L — ABNORMAL LOW (ref 98–111)
Creatinine, Ser: 4.73 mg/dL — ABNORMAL HIGH (ref 0.44–1.00)
GFR calc non Af Amer: 8 mL/min — ABNORMAL LOW (ref >60)
GFR, EST AFRICAN AMERICAN: 10 mL/min — AB (ref >60)
Glucose, Bld: 139 mg/dL — ABNORMAL HIGH (ref 70–99)
Potassium: 3.3 mmol/L — ABNORMAL LOW (ref 3.5–5.1)
Sodium: 136 mmol/L (ref 135–145)

## 2018-05-29 LAB — MAGNESIUM: Magnesium: 1.7 mg/dL (ref 1.7–2.4)

## 2018-05-29 MED ORDER — IPRATROPIUM-ALBUTEROL 0.5-2.5 (3) MG/3ML IN SOLN
3.0000 mL | Freq: Once | RESPIRATORY_TRACT | Status: AC
  Administered 2018-05-29: 3 mL via RESPIRATORY_TRACT
  Filled 2018-05-29: qty 3

## 2018-05-29 NOTE — Discharge Instructions (Addendum)
You were seen in the ER for PVCs. You had frequent PVCs while you were hooked up on the monitor but had no symptoms.  PVCs can be seen with underlying lung disease.  Continue all your home medications.   Call your primary care doctor or cardiologis and schedule an appointment for further evaluation/discussion of PVCs and intermittent palpitations.   Return to the ER for more frequent, stronger palpitations, light-headedness, passing out, chest pain or shortness of breath.  You had a little wheezing on left lower lobe, use albuterol every 6 hours for this.

## 2018-05-29 NOTE — ED Triage Notes (Signed)
Pt arrives from dialysis when staff discovered pt was having multiple PVCs, baseline rate in 30s-40s. Pt did complete full dialysis treatment. Pt has no complaints at this time; graft in right arm, lymphedema in left arm, must use right leg for BP.

## 2018-05-29 NOTE — ED Provider Notes (Signed)
[name] EMER[facility]Y DEPARTMENT Provider Note   CSN: [id number] Arrival date & time: 05/29/18  1045     History   Chief Complaint Chief Complaint  Patient presents with  . Irregular Heart Beat    HPI Jordan [name] is a 78 y.o. female with h/o left breast cancer, ESRD on HD, COPD, chronic 2L oxygen via [place], diabetes on insulin, chronic diastolic HF, recent PNA is here from dialysis center for noted "low HR".  History obtained from patient who states she was told her heart rate was low and recommended ER evaluation. Per triage note pt noted to have PVCs and HR in the 30-40s. No interventions PTA. Pt had completed her full dialysis session today.  She states she has had intermittent palpitations for a while, sometimes she notices them when she is exerting herself but not always.  States palpitations were more frequent before she started using 2 L O2.  She feels like her PNA symptoms are almost 100% better but still has lingering dry cough and sometimes hears herself wheezing.  She denies any medication changes. Denies ETOH use. Denies tobacco use. Is getting worked up for OSA by pulmonology.    Denies associated recent fevers, chills, CP, nausea, vomiting, abdominal pain, diarrhea, changes to urine output. States she typically has exertional dyspnea which is unchanged.    HPI  Past Medical History:  Diagnosis Date  . Arthritis   . Breast cancer ([place])   . CAD (coronary artery disease)    a. minimal CAD by cath in 10/2014.  Marland Kitchen CKD (chronic kidney disease)   . COPD (chronic obstructive pulmonary disease) ([place])   . Diabetes mellitus with ESRD (end-stage renal disease) ([place])   . Enterococcal bacteremia 12/26/2014  . GERD (gastroesophageal reflux disease)   . Hypertension   . Renal disorder    Family reports acute renal failure  . Sleep apnea    does not wear CPAP  . Systolic CHF, chronic ([place]) 12/23/2014    Patient Active Problem List   Diagnosis Date Noted    . Depression, major, single episode, mild ([place]) 04/29/2018  . HCAP (healthcare-associated pneumonia) 04/19/2018  . Leukocytosis 04/19/2018  . BPPV (benign paroxysmal positional vertigo) 03/26/2018  . Bronchitis 03/26/2018  . Anemia, chronic disease 03/03/2018  . Diabetic neuropathy ([place]) 03/03/2018  . Sleep apnea   . Renal disorder   . GERD (gastroesophageal reflux disease)   . Diabetes mellitus with ESRD (end-stage renal disease) ([place])   . CKD (chronic kidney disease)   . CAD (coronary artery disease)   . Paresthesia of left arm 11/09/2016  . Chronic thoracic back pain 11/09/2016  . Personal history of breast cancer 08/26/2016  . Atypical chest pain 08/03/2016  . Chronic diastolic CHF (congestive heart failure) ([place]) 05/11/2015  . Loose stools 01/13/2015  . Skin irritation 01/13/2015  . Rib pain on left side 01/13/2015  . Bacteremia   . Systolic CHF, chronic ([place]) 12/23/2014  . Lymphedema of left arm 12/08/2014  . OSA (obstructive sleep apnea) 12/08/2014  . Constipation 12/08/2014  . Nonischemic cardiomyopathy ([place]) 11/10/2014  . Acute on chronic systolic heart failure, NYHA class 3 ([place]) 11/10/2014  . Morbidly obese ([place]) 11/10/2014  . ESRD (end stage renal disease) on dialysis ([place])   . Type 2 diabetes mellitus with diabetic nephropathy ([place])   . Essential hypertension   . Left hip pain   . NSTEMI (non-ST elevated myocardial infarction) ([place]) 10/13/2014  . Pulmonary HTN ([place]) 10/13/2014  . LBBB (  left bundle branch block) 10/12/2014  . Elevated troponin 10/12/2014  . Breast cancer ([place])   . Hypertension   . Arthritis   . COPD (chronic obstructive pulmonary disease) ([place])     Past Surgical History:  Procedure Laterality Date  . ARTERIOVENOUS GRAFT PLACEMENT Right 10/05/15  . AV FISTULA PLACEMENT Right 10/26/2014   Procedure: Right Brachiocephalic Arteriovenous FISTULA CREATION;  Surgeon: Conrad [place], MD;  Location: [facility];  Service: Vascular;  Laterality: Right;  . AV  FISTULA PLACEMENT Right 10/05/2015   Procedure: INSERTION OF  ARTERIOVENOUS (AV) GORE-TEX GRAFT RIGHT ARM;  Surgeon: Conrad [place], MD;  Location: [facility];  Service: Vascular;  Laterality: Right;  . BASCILIC VEIN TRANSPOSITION Right 04/13/2015   Procedure: RIGHT FIRST STAGE BASCILIC VEIN TRANSPOSITION;  Surgeon: Conrad [place], MD;  Location: [facility];  Service: Vascular;  Laterality: Right;  . BASCILIC VEIN TRANSPOSITION Right 07/11/2015   Procedure: SECOND STAGE BASILIC VEIN TRANSPOSITION;  Surgeon: Conrad [place], MD;  Location: [facility];  Service: Vascular;  Laterality: Right;  . BREAST SURGERY Left   . CARDIAC CATHETERIZATION [name] 10/19/2014   Procedure: Right/Left Heart Cath and Coronary Angiography;  Surgeon: Peter M Martinique, MD;  Location: [facility] CV [facility];  Service: Cardiovascular;  Laterality: [name];  . FISTULA SUPERFICIALIZATION Right 01/26/2015   Procedure: Right BRACHIOCEPHALIC ARTERIOVENOUS FISTULA SUPERFICIALIZATION with side branch ligation;  Surgeon: Conrad D[place], MD;  Location: [facility];  Service: Vascular;  Laterality: Right;  . IR DIALY SHUNT INTRO [place] W/IMG RIGHT Right 08/23/2016  . IR GENERIC HISTORICAL  04/30/2016   IR REMOVAL TUN CV CATH W/O FL 04/30/2016 Aletta Edouard, MD [facility]-INTERV [facility]  . IR US GUIDE VASC ACCESS RIGHT  08/23/2016  . LIGATION OF ARTERIOVENOUS  FISTULA Right 03/07/2015   Procedure: LIGATION OF RIGHT BRACHIOCEPHALIC ARTERIOVENOUS  FISTULA;  Surgeon: Conrad Luquillo, MD;  Location: [facility];  Service: Vascular;  Laterality: Right;  . LIGATION OF ARTERIOVENOUS  FISTULA Right 10/05/2015   Procedure: LIGATION OF RIGHT BASILIC VEIN TRANSPOSITION; EXCISION OF CICATRIX;  Surgeon: Conrad [place], MD;  Location: [facility];  Service: Vascular;  Laterality: Right;  . PERIPHERAL VASCULAR CATHETERIZATION [name] 01/24/2015   Procedure: Fistulagram;  Surgeon: Conrad [place], MD;  Location: [facility];  Service: Cardiovascular;  Laterality: [name];  . PERIPHERAL VASCULAR CATHETERIZATION  [name] 08/29/2015   Procedure: Fistulagram;  Surgeon: Conrad Diggins, MD;  Location: [facility];  Service: Cardiovascular;  Laterality: [name];  . PERIPHERAL VASCULAR CATHETERIZATION Right 08/29/2015   Procedure: Peripheral Vascular Balloon Angioplasty;  Surgeon: Conrad [place], MD;  Location: [facility];  Service: Cardiovascular;  Laterality: Right;  arm fistula  . TEE WITHOUT CARDIOVERSION [name] 12/27/2014   Procedure: TRANSESOPHAGEAL ECHOCARDIOGRAM (TEE);  Surgeon: Thayer Headings, MD;  Location: [facility];  Service: Cardiovascular;  Laterality: [name];  . THROMBECTOMY W/ EMBOLECTOMY Right 03/07/2015   Procedure: THROMBECTOMY OF RIGHT BRACHIOCEPHALIC ARTERIOVENOUS FISTULA;  Surgeon: Conrad Gilead, MD;  Location: [facility];  Service: Vascular;  Laterality: Right;  . THROMBECTOMY W/ EMBOLECTOMY Right 01/27/2016   Procedure: THROMBECTOMY AND REVISION OF RIGHT UPPER ARM ARTERIOVENOUS GORE-TEX GRAFT;  Surgeon: Angelia Mould, MD;  [facility];  Service: Vascular;  Laterality: Right;     OB History   No obstetric history on file.      Home Medications    Prior to Admission medications   Medication Sig Start Date End Date Taking? Authorizing Provider  albuterol (  PROVENTIL HFA;VENTOLIN HFA) 108 (90 Base) MCG/ACT inhaler Inhale 2 puffs into the lungs every 6 (six) hours as needed for wheezing or shortness of breath. 04/03/18   Leone [place], MD  aspirin EC 81 MG tablet Take 81 mg by mouth See admin instructions. Take one tablet (81 mg) by mouth every Monday, Wednesday, Friday and Sunday morning (non-dialysis days)    [provider]  atorvastatin (LIPITOR) 40 MG tablet TAKE 1 TABLET (40 MG TOTAL) BY MOUTH DAILY AT 6 PM. 04/29/18   Leone [place], MD  benzonatate (TESSALON) 200 MG capsule Take 1 capsule (200 mg total) by mouth 2 (two) times daily as needed for cough. 05/16/18   Leone [place], MD  Calcium Carbonate Antacid (TUMS ULTRA 1000 PO) Take 1,000 mg by mouth 3  (three) times daily with meals.    [provider]  carvedilol (COREG) 6.25 MG tablet Take 1 tablet (6.25 mg total) by mouth See admin instructions. Take 6.25 mg by mouth twice daily on Monday, Wednesday, Friday and Sunday. Patient taking differently: Take 6.25 mg by mouth See admin instructions. Take one tablet ( 6.25 mg) by mouth twice daily on Monday, Wednesday, Friday and Sunday. (non-dialysis days) 11/09/16   Leone [place], MD  clotrimazole (LOTRIMIN) 1 % cream Apply 1 application topically 2 (two) times daily as needed (rash under breasts). 05/06/18   Leone [place], MD  gabapentin (NEURONTIN) 300 MG capsule Take 1 capsule (300 mg total) by mouth See admin instructions. Take one capsule (300 mg) by mouth every Monday, Wednesday, Friday and Sunday morning (non-dialysis days) 04/11/18   Leone [place], MD  glucose blood [facility] VERIO) test strip Test once daily 03/26/18   Leone [place], MD  Insulin Pen Needle (BD PEN NEEDLE NANO U/F) 32G X 4 MM MISC Use as directed 03/27/18   Leone [place], MD  Lancets [facility] ULTRASOFT) lancets Use to check blood sugars 2-3 times daily as directed. 03/26/18   Leone [place], MD  LEVEMIR FLEXTOUCH 100 UNIT/ML Pen INJECT 55 UNITS INTO THE SKIN AT BEDTIME. Patient taking differently: Inject 55 Units into the skin every evening.  07/11/17   Leone [place], MD  meclizine (ANTIVERT) 25 MG tablet Take 1 tablet (25 mg total) by mouth 3 (three) times daily as needed for dizziness. 05/06/18   Leone [place], MD  midodrine (PROAMATINE) 5 MG tablet Take 5 mg by mouth See admin instructions. Take one tablet (5 mg) by mouth during dialysis (Tuesday, Thursday, Saturday) as needed for low blood pressure <79/30 06/21/17   [provider]  Multiple Vitamin (MULTIVITAMIN WITH MINERALS) TABS tablet Take 1 tablet by mouth See admin instructions. Take one tablet by mouth every Monday, Wednesday, Friday and Sunday morning (non-dialysis  days) - [facility]    [provider]  omeprazole (PRILOSEC) 20 MG capsule Take 20 mg by mouth See admin instructions. Take one capsule (20 mg) by mouth on Monday, Wednesday, Friday and Sunday morning (non-dialysis days)    [provider]    Family History Family History  Problem Relation Age of Onset  . Diabetes Mother   . Hypertension Mother   . Stroke Sister   . Hypertension Sister   . Heart attack Neg Hx     Social History Social History   Tobacco Use  . Smoking status: Former Research scientist (life sciences)  . Smokeless tobacco: Never Used  Substance Use Topics  . Alcohol use: No    Alcohol/week: 0.0 standard drinks  .  Drug use: No     Allergies   Vicodin [hydrocodone-acetaminophen] and Adhesive [tape]   Review of Systems Review of Systems  Cardiovascular: Positive for palpitations.  All other systems reviewed and are negative.    Physical Exam Updated Vital Signs BP 138/65   Pulse 72   Temp 97.9 F (36.6 C) (Oral)   Resp 18   Ht 5\' 5"  (1.651 m)   Wt 126.9 kg   SpO2 99%   BMI 46.56 kg/m   Physical Exam Vitals signs and nursing note reviewed.  Constitutional:      Appearance: She is well-developed.     Comments: Non toxic  HENT:     Head: Normocephalic and atraumatic.     Nose: Nose normal.  Eyes:     Conjunctiva/sclera: Conjunctivae normal.     Pupils: Pupils are equal, round, and reactive to light.  Neck:     Musculoskeletal: Normal range of motion.  Cardiovascular:     Rate and Rhythm: Normal rate and regular rhythm.     Comments: 1+ radial and DP pulses bilaterally. Trace edema to lower legs, no calf tenderness. Pulmonary:     Effort: Pulmonary effort is normal.     Breath sounds: Wheezing present.     Comments: Expiratory wheezing to mid left lower lobe. Normal WOB. Diminished lung sounds to lower lobes difficult exam due to body habitus. No crackles  Abdominal:     General: Bowel sounds are normal.     Palpations: Abdomen is soft.      Tenderness: There is no abdominal tenderness.     Comments: No G/R/R. No suprapubic or CVA tenderness. Negative Murphy's and McBurney's. Active BS to lower quadrants.   Musculoskeletal: Normal range of motion.  Skin:    General: Skin is warm and dry.     Capillary Refill: Capillary refill takes less than 2 seconds.  Neurological:     Mental Status: She is alert and oriented to person, place, and time.  Psychiatric:        Behavior: Behavior normal.      ED Treatments / Results  Labs (all labs ordered are listed, but only abnormal results are displayed) Labs Reviewed  CBC WITH DIFFERENTIAL/PLATELET - Abnormal; Notable for the following components:      Result Value   WBC 11.6 (*)    RBC 3.70 (*)    Hemoglobin 9.9 (*)    HCT 34.2 (*)    MCHC 28.9 (*)    Neutro Abs 9.5 (*)    All other components within normal limits  BASIC METABOLIC PANEL - Abnormal; Notable for the following components:   Potassium 3.3 (*)    Chloride 96 (*)    Glucose, Bld 139 (*)    Creatinine, Ser 4.73 (*)    Calcium 8.5 (*)    GFR calc non Af Amer 8 (*)    GFR calc Af Amer 10 (*)    All other components within normal limits  MAGNESIUM    EKG EKG Interpretation  Date/Time:  Thursday May 29 2018 10:54:07 EST Ventricular Rate:  92 PR Interval:    QRS Duration: 114 QT Interval:  369 QTC Calculation: 457 R Axis:   38 Text Interpretation:  Sinus tachycardia Paired ventricular premature complexes Borderline intraventricular conduction delay Low voltage, extremity and precordial leads Confirmed by Virgel Manifold [phone]) on 05/29/2018 11:00:57 AM   Radiology Dg Chest 2 View  Result Date: 05/29/2018 CLINICAL DATA:  Wheezing. History of pneumonia. Patient is on dialysis.  EXAM: CHEST - 2 VIEW COMPARISON:  04/29/2018 FINDINGS: Stable cardiac enlargement and mild tortuosity and calcification of the thoracic aorta. Mild central vascular congestion but no definite infiltrates or effusions. Stable basilar  scarring changes. IMPRESSION: Cardiac enlargement and vascular congestion but no overt pulmonary edema or pleural effusions. Chronic lung changes with basilar scarring. Electronically Signed   By: Marijo Sanes M.D.   On: 05/29/2018 13:04    Procedures Procedures (including critical care time)  Medications Ordered in ED Medications  ipratropium-albuterol (DUONEB) 0.5-2.5 (3) MG/3ML nebulizer solution 3 mL (3 mLs Nebulization Given 05/29/18 1329)     Initial Impression / Assessment and Plan / ED Course  I have reviewed the triage vital signs and the nursing notes.  Pertinent labs & imaging results that were available during my care of the patient were reviewed by me and considered in my medical decision making (see chart for details).  Clinical Course as of May 29 1353  Thu May 29, 2018  1230 Hemoglobin(!): 9.9 [CG]  1230 WBC(!): 11.6 [CG]  1230 Potassium(!): 3.3 [CG]  1230 Creatinine(!): 4.73 [CG]  1230 GFR, Est African American(!): 10 [CG]  1311 IMPRESSION: Cardiac enlargement and vascular congestion but no overt pulmonary edema or pleural effusions.  Chronic lung changes with basilar scarring  DG Chest 2 View [CG]  1350 Sinus tachycardia Paired ventricular premature complexes Borderline intraventricular conduction delay Low voltage, extremity and precordial leads Confirmed by Virgel Manifold [phone]) on 05/29/2018 11:00:57 AM  EKG 12-Lead [CG]    Clinical Course User Index [CG] Kinnie Feil, PA-C   78 yo presents with minimally symptomatic PVCs.  EKG reviewed by EDMD w/o significant brady or tachy-arrhythmias.  Pt was asymptomatic during ER visit.  She had full dialysis tx PTA where report of bradycardia and PVCs was noticed.  Creatinine at baseline. K 3.3 Mg normal.  Hgb 9.9 baseline. I suspect PVCs are related to underlying lung disease, OSA.  I obtained cxr given recent PNA and focal wheezing in LLL. CXR w/o acute abnormalities. She is afebrile with WBC 11.6, no  respiratory distress or increased oxygen demands.  She feels like her PNA symptoms are improving. Wheezing significantly improved after breathing tx.  Pt admits she has not been using albuterol at home.  We will dc with f/u with primary cardiology team for further discussion of PVCs.  She is HD stable, w/o concerning cardiac symptoms like CP, SOB, light-headedness and I do not see indication for further emergent labs, imaging or admission. Encouraged breathing tx at home. Return precautions given. Pt ins agreement. Discussed with EDMD.   Final Clinical Impressions(s) / ED Diagnoses   Final diagnoses:  PVC (premature ventricular contraction)  Palpitations  Wheezing    ED Discharge Orders    None       Arlean Hopping 05/29/18 1355    Virgel Manifold, MD 05/30/18 [phone]

## 2018-05-30 ENCOUNTER — Telehealth: Payer: Self-pay

## 2018-05-30 NOTE — Telephone Encounter (Signed)
Copied from [place] [id number]. Topic: General - Other >> May 30, 2018  2:07 PM Carolyn Stare wrote:  Pt call to say she need an appt with Dr Caryl Bis for a ER fup 05/29/2018 for heart palpitations. Please contact to schedule

## 2018-05-30 NOTE — Telephone Encounter (Signed)
Tried to reach patient no answer.

## 2018-06-02 NOTE — Telephone Encounter (Addendum)
It appears I have an open hospital follow-up appointment on 06/04/2018 at 1130.  Please schedule her during that time slot.  Forwarding to [place] to schedule during that time slot.  Thanks.

## 2018-06-02 NOTE — Telephone Encounter (Signed)
For this pt I know you are booked out and Im sure she needs to f/u 1-2 weeks after her discharge. Would you like me to schedule her for a 4:30 appt or a 3:15 appt if available?

## 2018-06-03 NOTE — Telephone Encounter (Signed)
The 1130 was taken had to schedule for 1445 OK ?

## 2018-06-03 NOTE — Telephone Encounter (Signed)
That is fine 

## 2018-06-04 ENCOUNTER — Ambulatory Visit (INDEPENDENT_AMBULATORY_CARE_PROVIDER_SITE_OTHER): Payer: Medicare Other | Admitting: Family Medicine

## 2018-06-04 ENCOUNTER — Encounter: Payer: Self-pay | Admitting: Family Medicine

## 2018-06-04 VITALS — BP 141/57 | HR 75 | Temp 97.5°F | Ht 65.0 in | Wt 275.0 lb

## 2018-06-04 DIAGNOSIS — G4733 Obstructive sleep apnea (adult) (pediatric): Secondary | ICD-10-CM

## 2018-06-04 DIAGNOSIS — J189 Pneumonia, unspecified organism: Secondary | ICD-10-CM | POA: Diagnosis not present

## 2018-06-04 DIAGNOSIS — I493 Ventricular premature depolarization: Secondary | ICD-10-CM | POA: Diagnosis not present

## 2018-06-04 DIAGNOSIS — H9192 Unspecified hearing loss, left ear: Secondary | ICD-10-CM | POA: Diagnosis not present

## 2018-06-04 NOTE — Patient Instructions (Signed)
Nice to see you. Please keep your appointment with cardiology. We will get you set up with ENT. Please call pulmonology to check on your sleep study.

## 2018-06-04 NOTE — Progress Notes (Signed)
Patient was allowed to rest in room for 10 minutes with only room air, sats recorded at 97%, Patient walking on room air for 5 minutes on room air sat dropped to the lowest 94%, Patient heart rate at beginning of walk ranged from 74 to 90 , but as patient became Short of breath heart rate declined to 48 BPM on room air. Patient was allowed tpo stop and rest and Heart rate increased to 90 BPM, when shortness of breath resolved. Patient advised nurse she was told to wear her 02 especially at night because saturations drop while sleeping in the hospital.

## 2018-06-04 NOTE — Progress Notes (Signed)
[name], MD Phone: [phone]  Jordan [name] is a 78 y.o. female who presents today for follow-up.  CC: PVCs, pneumonia, ESRD, hard of hearing  Patient was evaluated in the emergency department for low heart rate while she was at dialysis.  She notes the physician there told her to be evaluated as a precaution.  She was found to have PVCs.  She notes no palpitations.  No chest pain.  She notes her breathing is improving progressively.  She is able to go upstairs now.  Minimal wheezing.  Cough has improved significantly.  She has had no fevers.  She did see pulmonology and it appears they felt as though her pleural effusions and vascular congestion were related to volume overload.  They recommended management through dialysis and cardiology.  She notes she does not produce urine and continues to go to dialysis as scheduled.  She was also advised that she needed a sleep study though has not heard anything regarding this.  She has follow-up with cardiology on the 24th.  She remains on oxygen intermittently.  She notes she was told at the hospital that she needed this at night due to oxygen desaturations.  Social History   Tobacco Use  Smoking Status Former Smoker  Smokeless Tobacco Never Used     ROS see history of present illness  Objective  Physical Exam Vitals:   06/04/18 1446 06/04/18 1520  BP: (!) 151/52 (!) 141/57  Pulse: 75   Temp: (!) 97.5 F (36.4 C)   SpO2: 95%     BP Readings from Last 3 Encounters:  06/04/18 (!) 141/57  05/29/18 (!) 143/97  05/14/18 140/82   Wt Readings from Last 3 Encounters:  06/04/18 275 lb (124.7 kg)  05/29/18 279 lb 12.2 oz (126.9 kg)  04/29/18 279 lb 12.8 oz (126.9 kg)    Physical Exam Constitutional:      General: She is not in acute distress.    Appearance: She is not diaphoretic.  HENT:     Left Ear: Tympanic membrane normal.     Ears:     Comments: Hearing diminished to finger rub bilaterally Cardiovascular:     Rate and Rhythm: Normal rate and regular rhythm.     Heart sounds: Normal heart sounds.  Pulmonary:     Effort: Pulmonary effort is normal.     Breath sounds: Normal breath sounds.  Skin:    General: Skin is warm and dry.  Neurological:     Mental Status: She is alert.      Assessment/Plan: Please see individual problem list.  PVC's (premature ventricular contractions) Noted during ED visit.  Seem to be asymptomatic.  I suspect these account for her low heart rate at dialysis as they were likely not picked up by the machine checking her heart rate.  She will see cardiology as scheduled.  May need to consider decreasing her dose of carvedilol if her heart rate is truly dropping as outlined in our nurses note.  HCAP (healthcare-associated pneumonia) Symptoms have improved significantly.  She has progressively improving.  She will monitor.  OSA (obstructive sleep apnea) History of OSA.  Desaturations at night requiring oxygen.  She will contact pulmonology to get her sleep study set up.  This will help to determine whether or not she needs a CPAP and can confirm desaturations at night.  Hearing loss of left ear Refer to ENT.   Orders Placed This Encounter  Procedures  . Ambulatory referral to ENT  Referral Priority:   Routine    Referral Type:   Consultation    Referral Reason:   Specialty Services Required    Requested Specialty:   Otolaryngology    Number of Visits Requested:   1    No orders of the defined types were placed in this encounter.    [name], MD [place]

## 2018-06-05 DIAGNOSIS — I493 Ventricular premature depolarization: Secondary | ICD-10-CM | POA: Insufficient documentation

## 2018-06-05 DIAGNOSIS — H9192 Unspecified hearing loss, left ear: Secondary | ICD-10-CM | POA: Insufficient documentation

## 2018-06-05 NOTE — Assessment & Plan Note (Signed)
Symptoms have improved significantly.  She has progressively improving.  She will monitor.

## 2018-06-05 NOTE — Assessment & Plan Note (Signed)
Refer to ENT

## 2018-06-05 NOTE — Assessment & Plan Note (Signed)
History of OSA.  Desaturations at night requiring oxygen.  She will contact pulmonology to get her sleep study set up.  This will help to determine whether or not she needs a CPAP and can confirm desaturations at night.

## 2018-06-05 NOTE — Assessment & Plan Note (Signed)
Noted during ED visit.  Seem to be asymptomatic.  I suspect these account for her low heart rate at dialysis as they were likely not picked up by the machine checking her heart rate.  She will see cardiology as scheduled.  May need to consider decreasing her dose of carvedilol if her heart rate is truly dropping as outlined in our nurses note.

## 2018-06-11 ENCOUNTER — Encounter: Payer: Self-pay | Admitting: Adult Health

## 2018-06-11 ENCOUNTER — Ambulatory Visit (INDEPENDENT_AMBULATORY_CARE_PROVIDER_SITE_OTHER): Payer: Medicare Other | Admitting: Adult Health

## 2018-06-11 VITALS — BP 130/62 | HR 89 | Ht 65.0 in | Wt 273.4 lb

## 2018-06-11 DIAGNOSIS — G4733 Obstructive sleep apnea (adult) (pediatric): Secondary | ICD-10-CM

## 2018-06-11 DIAGNOSIS — I10 Essential (primary) hypertension: Secondary | ICD-10-CM | POA: Diagnosis not present

## 2018-06-11 DIAGNOSIS — I2510 Atherosclerotic heart disease of native coronary artery without angina pectoris: Secondary | ICD-10-CM

## 2018-06-11 DIAGNOSIS — N186 End stage renal disease: Secondary | ICD-10-CM

## 2018-06-11 DIAGNOSIS — Z992 Dependence on renal dialysis: Secondary | ICD-10-CM

## 2018-06-11 NOTE — Patient Instructions (Signed)
Follow-Up: You will need a follow up appointment in 6 months.  Please call our office 2 months in advance (July 2020) to schedule this (September 2020) appointment.  You may see Peter Martinique, MD, Jory Sims, DNP, AACC or one of the following Advanced Practice Providers on your designated Care Team:  Almyra Deforest, PA-C  Fabian Sharp, [place]        Medication Instructions:  NO CHANGES- Your physician recommends that you continue on your current medications as directed. Please refer to the Current Medication list given to you today. If you need a refill on your cardiac medications before your next appointment, please call your pharmacy. Labwork: When you have labs (blood work) and your tests are completely normal, you will receive your results ONLY by [place] (if you have MyChart) -OR- A paper copy in the mail.  At [facility], you and your health needs are our priority.  As part of our continuing mission to provide you with exceptional heart care, we have created designated Provider Care Teams.  These Care Teams include your primary Cardiologist (physician) and Advanced Practice Providers (APPs -  Physician Assistants and Nurse Practitioners) who all work together to provide you with the care you need, when you need it.  Thank you for choosing [facility] at [facility]!!

## 2018-06-11 NOTE — Progress Notes (Signed)
Cardiology Office Note   Date:  06/11/2018   ID:  Zauria Dombek [name], DOB 1940/06/15, MRN [id number]  PCP:  Leone Haven, MD  Cardiologist: Dr.Jordan  Chief Complaint  Patient presents with  . Palpitations  . Hypertension     History of Present Illness: Jordan [name] is a 78 y.o. female who presents for ongoing assessment and management of chronic diastolic CHF, HTN, mild CAD by cath, ESRD (HD T,Th, Sat), HTN, HLD, Type 2 DM, chronic O2 dependence, and remote history of breast cancer (occurring in 2014), and LBBB. She was last seen by Dr. Martinique on 03/15/2017 and was stable, having no complaints.   She was seen in the ED on 05/29/2018 for complaints of irregular HR. EKG demonstrated that she had tachycardia with paired PVC's.HR of 92 bpm. She was not found to be in CHF. Potassium was 3.3.   She comes today without any complaints. She remains on current treatment with dialysis and BP control medications. She has not had any further irregular HR abnormalities. She remains as active as she can, but uses a walker for ambulation.   Past Medical History:  Diagnosis Date  . Arthritis   . Breast cancer ([place])   . CAD (coronary artery disease)    a. minimal CAD by cath in 10/2014.  Marland Kitchen CKD (chronic kidney disease)   . COPD (chronic obstructive pulmonary disease) ([place])   . Diabetes mellitus with ESRD (end-stage renal disease) ([place])   . Enterococcal bacteremia 12/26/2014  . GERD (gastroesophageal reflux disease)   . Hypertension   . Renal disorder    Family reports acute renal failure  . Sleep apnea    does not wear CPAP  . Systolic CHF, chronic ([place]) 12/23/2014    Past Surgical History:  Procedure Laterality Date  . ARTERIOVENOUS GRAFT PLACEMENT Right 10/05/15  . AV FISTULA PLACEMENT Right 10/26/2014   Procedure: Right Brachiocephalic Arteriovenous FISTULA CREATION;  Surgeon: Conrad Blunt, MD;  Location: [place];  Service: Vascular;  Laterality: Right;  . AV FISTULA  PLACEMENT Right 10/05/2015   Procedure: INSERTION OF  ARTERIOVENOUS (AV) GORE-TEX GRAFT RIGHT ARM;  Surgeon: Conrad Agua Dulce, MD;  Location: [place];  Service: Vascular;  Laterality: Right;  . BASCILIC VEIN TRANSPOSITION Right 04/13/2015   Procedure: RIGHT FIRST STAGE BASCILIC VEIN TRANSPOSITION;  Surgeon: Conrad Kingsley, MD;  Location: [place];  Service: Vascular;  Laterality: Right;  . BASCILIC VEIN TRANSPOSITION Right 07/11/2015   Procedure: SECOND STAGE BASILIC VEIN TRANSPOSITION;  Surgeon: Conrad Marydel, MD;  Location: [place];  Service: Vascular;  Laterality: Right;  . BREAST SURGERY Left   . CARDIAC CATHETERIZATION [name] 10/19/2014   Procedure: Right/Left Heart Cath and Coronary Angiography;  Surgeon: Peter M Martinique, MD;  Location: [place] [facility];  Service: Cardiovascular;  Laterality: [name];  . FISTULA SUPERFICIALIZATION Right 01/26/2015   Procedure: Right BRACHIOCEPHALIC ARTERIOVENOUS FISTULA SUPERFICIALIZATION with side branch ligation;  Surgeon: Conrad Peggs, MD;  Location: [place];  Service: Vascular;  Laterality: Right;  . IR DIALY SHUNT INTRO [place] W/IMG RIGHT Right 08/23/2016  . IR GENERIC HISTORICAL  04/30/2016   IR REMOVAL TUN CV CATH W/O FL 04/30/2016 Aletta Edouard, MD [facility]  . IR US GUIDE VASC ACCESS RIGHT  08/23/2016  . LIGATION OF ARTERIOVENOUS  FISTULA Right 03/07/2015   Procedure: LIGATION OF RIGHT BRACHIOCEPHALIC ARTERIOVENOUS  FISTULA;  Surgeon: Conrad Rosebush, MD;  Location: [place];  Service: Vascular;  Laterality: Right;  . LIGATION  OF ARTERIOVENOUS  FISTULA Right 10/05/2015   Procedure: LIGATION OF RIGHT BASILIC VEIN TRANSPOSITION; EXCISION OF CICATRIX;  Surgeon: Conrad Richmond Hill, MD;  Location: [place];  Service: Vascular;  Laterality: Right;  . PERIPHERAL VASCULAR CATHETERIZATION [name] 01/24/2015   Procedure: Fistulagram;  Surgeon: Conrad Mansfield, MD;  Location: [place] [facility];  Service: Cardiovascular;  Laterality: [name];  . PERIPHERAL VASCULAR CATHETERIZATION [name]  08/29/2015   Procedure: Fistulagram;  Surgeon: Conrad La Presa, MD;  Location: [place] [facility];  Service: Cardiovascular;  Laterality: [name];  . PERIPHERAL VASCULAR CATHETERIZATION Right 08/29/2015   Procedure: Peripheral Vascular Balloon Angioplasty;  Surgeon: Conrad La Sal, MD;  Location: [place] [facility];  Service: Cardiovascular;  Laterality: Right;  arm fistula  . TEE WITHOUT CARDIOVERSION [name] 12/27/2014   Procedure: TRANSESOPHAGEAL ECHOCARDIOGRAM (TEE);  Surgeon: Thayer Headings, MD;  Location: [place];  Service: Cardiovascular;  Laterality: [name];  . THROMBECTOMY W/ EMBOLECTOMY Right 03/07/2015   Procedure: THROMBECTOMY OF RIGHT BRACHIOCEPHALIC ARTERIOVENOUS FISTULA;  Surgeon: Conrad , MD;  Location: [place];  Service: Vascular;  Laterality: Right;  . THROMBECTOMY W/ EMBOLECTOMY Right 01/27/2016   Procedure: THROMBECTOMY AND REVISION OF RIGHT UPPER ARM ARTERIOVENOUS GORE-TEX GRAFT;  Surgeon: Angelia Mould, MD;  Location: [place];  Service: Vascular;  Laterality: Right;     Current Outpatient Medications  Medication Sig Dispense Refill  . albuterol (PROVENTIL HFA;VENTOLIN HFA) 108 (90 Base) MCG/ACT inhaler Inhale 2 puffs into the lungs every 6 (six) hours as needed for wheezing or shortness of breath. 1 Inhaler 0  . aspirin EC 81 MG tablet Take 81 mg by mouth See admin instructions. Take one tablet (81 mg) by mouth every Monday, Wednesday, Friday and Sunday morning (non-dialysis days)    . atorvastatin (LIPITOR) 40 MG tablet TAKE 1 TABLET (40 MG TOTAL) BY MOUTH DAILY AT 6 PM. 30 tablet 1  . benzonatate (TESSALON) 200 MG capsule Take 1 capsule (200 mg total) by mouth 2 (two) times daily as needed for cough. 20 capsule 0  . Calcium Carbonate Antacid (TUMS ULTRA 1000 PO) Take 1,000 mg by mouth 3 (three) times daily with meals.    . carvedilol (COREG) 6.25 MG tablet Take 1 tablet (6.25 mg total) by mouth See admin instructions. Take 6.25 mg by mouth twice daily on Monday, Wednesday,  Friday and Sunday. (Patient taking differently: Take 6.25 mg by mouth See admin instructions. Take one tablet ( 6.25 mg) by mouth twice daily on Monday, Wednesday, Friday and Sunday. (non-dialysis days)) 60 tablet 3  . clotrimazole (LOTRIMIN) 1 % cream Apply 1 application topically 2 (two) times daily as needed (rash under breasts). 30 g 2  . gabapentin (NEURONTIN) 300 MG capsule Take 1 capsule (300 mg total) by mouth See admin instructions. Take one capsule (300 mg) by mouth every Monday, Wednesday, Friday and Sunday morning (non-dialysis days) 48 capsule 1  . glucose blood (ONETOUCH VERIO) test strip Test once daily 100 each 12  . Insulin Pen Needle (BD PEN NEEDLE NANO U/F) 32G X 4 MM MISC Use as directed 100 each 5  . Lancets (ONETOUCH ULTRASOFT) lancets Use to check blood sugars 2-3 times daily as directed. 100 each 12  . LEVEMIR FLEXTOUCH 100 UNIT/ML Pen INJECT 55 UNITS INTO THE SKIN AT BEDTIME. (Patient taking differently: Inject 55 Units into the skin every evening. ) 15 pen 2  . meclizine (ANTIVERT) 25 MG tablet Take 1 tablet (25 mg total) by mouth 3 (three) times daily as  needed for dizziness. 30 tablet 0  . midodrine (PROAMATINE) 5 MG tablet Take 5 mg by mouth See admin instructions. Take one tablet (5 mg) by mouth during dialysis (Tuesday, Thursday, Saturday) as needed for low blood pressure <79/30  11  . Multiple Vitamin (MULTIVITAMIN WITH MINERALS) TABS tablet Take 1 tablet by mouth See admin instructions. Take one tablet by mouth every Monday, Wednesday, Friday and Sunday morning (non-dialysis days) - [facility]    . omeprazole (PRILOSEC) 40 MG capsule      No current facility-administered medications for this visit.     Allergies:   Vicodin [hydrocodone-acetaminophen] and Adhesive [tape]    Social History:  The patient  reports that she has quit smoking. She has never used smokeless tobacco. She reports that she does not drink alcohol or use drugs.   Family History:  The  patient's family history includes Diabetes in her mother; Hypertension in her mother and sister; Stroke in her sister.    ROS: All other systems are reviewed and negative. Unless otherwise mentioned in H&P    PHYSICAL EXAM: VS:  BP 130/62   Pulse 89   Ht 5\' 5"  (1.651 m)   Wt 273 lb 6.4 oz (124 kg)   SpO2 97% Comment: 2L  BMI 45.50 kg/m  , BMI Body mass index is 45.5 kg/m. GEN: Well nourished, well developed, in no acute distress, morbidly obese  HEENT: normal Neck: no JVD, carotid bruits, or masses Cardiac: RRR; no murmurs, rubs, or gallops,no edema  Respiratory:  Clear to auscultation bilaterally, normal work of breathing GI: soft, nontender, nondistended, + BS MS: no deformity or atrophy. Dialysis catheter to the right brachial  Skin: warm and dry, no rash Neuro:  Strength and sensation are intact Psych: euthymic mood, full affect   EKG:  NSR rate of 77 bpm.   Recent Labs: 04/20/2018: B Natriuretic Peptide 884.2 05/29/2018: BUN 13; Creatinine, Ser 4.73; Hemoglobin 9.9; Magnesium 1.7; Platelets 189; Potassium 3.3; Sodium 136    Lipid Panel    Component Value Date/Time   CHOL 199 10/12/2014 0745   TRIG 113 10/12/2014 0745   HDL 53 10/12/2014 0745   CHOLHDL 3.8 10/12/2014 0745   VLDL 23 10/12/2014 0745   LDLCALC 123 (H) 10/12/2014 0745      Wt Readings from Last 3 Encounters:  06/11/18 273 lb 6.4 oz (124 kg)  06/04/18 275 lb (124.7 kg)  05/29/18 279 lb 12.2 oz (126.9 kg)    Other studies Reviewed: Echocardiogram 01-Apr-2018  Left ventricle: The cavity size was mildly dilated. Wall   thickness was increased in a pattern of mild LVH. Systolic   function was normal. The estimated ejection fraction was in the   range of 50% to 55%. The study is not technically sufficient to   allow evaluation of LV diastolic function. - Aortic valve: Calcified mild AS poorly visualize cannot tell   number of leaflets. Valve area (VTI): 1.42 cm^2. Valve area   (Vmean): 1.43 cm^2. -  Mitral valve: Calcified annulus. Valve area by continuity   equation (using LVOT flow): 1.25 cm^2. - Left atrium: The atrium was moderately dilated. - Impressions: If clinical suspicion for SBE high consider TEE.  Cardiac cath 10/19/2014 Conclusion    Mild nonobstructive CAD  Severe pulmonary HTN  Elevated LV filling pressures.  Normal cardiac output   Recommendations: continue medical therapy. Needs additional IV diuresis.     ASSESSMENT AND PLAN:  1. Palpitations: Sent to ED for frequent PVC;s while having  dialysis She was not given any new treatment with the exception of a nebulizer treatment.  She reports no further symptoms.   2. Hypertension: Well controlled. She is on midodrine during dialysis. No changes in her regimen.  3. CAD: Non-obstructive per cath in 2016. She is asymptomatic for chest pain. She has some DOE with ambulation likely to obesity.   4. OSA: Noted pulmonary hypertension as a result.  5. ESRD: She is compliant with dialysis and labs are followed through that clinic.    Current medicines are reviewed at length with the patient today.    Labs/ tests ordered today include: None  Phill Myron. [place] Pugh, ANP, AACC   06/11/2018 12:38 PM    [facility] [address] Office [phone] Fax [phone]

## 2018-07-10 ENCOUNTER — Ambulatory Visit: Payer: Self-pay | Admitting: *Deleted

## 2018-07-10 NOTE — Telephone Encounter (Signed)
Lets do a virtual visit for this patient. Please try to get her set up for webex visit. Thanks.

## 2018-07-10 NOTE — Telephone Encounter (Signed)
Patient states she has head and chest congestion and coughing up mucus. Reports no fever that she is aware of. Please advise.   Call to patient- patient has been treating cough with benadryl and delsym using 2 days- patient is using 2 liter of O2 .  Reason for Disposition . [1] Known COPD or other severe lung disease (i.e., bronchiectasis, cystic fibrosis, lung surgery) AND [2] worsening symptoms (i.e., increased sputum purulence or amount, increased breathing difficulty    Patient states she developed cough on Tuesday- she states she has thick sputum that chokes her when she coughs. She states it is worse at night and in the morning. Patient has not had to increase the amount of O2 she is using and she reports the cough has not affected her breathing yet.  Answer Assessment - Initial Assessment Questions 1. ONSET: "When did the cough begin?"      Tuesday 2. SEVERITY: "How bad is the cough today?"      Worse late in evening and early in morning, mucus can be choking it is so thick 3. RESPIRATORY DISTRESS: "Describe your breathing."      Not effecting breathing 4. FEVER: "Do you have a fever?" If so, ask: "What is your temperature, how was it measured, and when did it start?"     No fever 5. SPUTUM: "Describe the color of your sputum" (clear, white, yellow, green)     White/yellowish 6. HEMOPTYSIS: "Are you coughing up any blood?" If so ask: "How much?" (flecks, streaks, tablespoons, etc.)     No blood 7. CARDIAC HISTORY: "Do you have any history of heart disease?" (e.g., heart attack, congestive heart failure)      CHF 8. LUNG HISTORY: "Do you have any history of lung disease?"  (e.g., pulmonary embolus, asthma, emphysema)     no 9. PE RISK FACTORS: "Do you have a history of blood clots?" (or: recent major surgery, recent prolonged travel, bedridden)     no 10. OTHER SYMPTOMS: "Do you have any other symptoms?" (e.g., runny nose, wheezing, chest pain)       no 11. PREGNANCY: "Is there any  chance you are pregnant?" "When was your last menstrual period?"       n/a 12. TRAVEL: "Have you traveled out of the country in the last month?" (e.g., travel history, exposures)       No travel, no exposure  Protocols used: [place]

## 2018-07-11 ENCOUNTER — Encounter: Payer: Self-pay | Admitting: Family Medicine

## 2018-07-11 ENCOUNTER — Other Ambulatory Visit: Payer: Self-pay

## 2018-07-11 ENCOUNTER — Ambulatory Visit (INDEPENDENT_AMBULATORY_CARE_PROVIDER_SITE_OTHER): Payer: Medicare Other | Admitting: Family Medicine

## 2018-07-11 DIAGNOSIS — J40 Bronchitis, not specified as acute or chronic: Secondary | ICD-10-CM

## 2018-07-11 MED ORDER — DOXYCYCLINE HYCLATE 100 MG PO TABS: 100.0000 mg | ORAL_TABLET | Freq: Two times a day (BID) | ORAL | 0 refills | Status: DC

## 2018-07-11 NOTE — Progress Notes (Signed)
Virtual Visit via Telephone Note  I connected with Jordan [name] on 07/11/18 at  3:30 PM EDT by telephone and verified that I am speaking with the correct person using two identifiers.   I discussed the limitations, risks, security and privacy concerns of performing an evaluation and management service by telephone and the availability of in person appointments. I also discussed with the patient that there may be a patient responsible charge related to this service. The patient expressed understanding and agreed to proceed.  The patient Jordan [name]) was at home and I [name]) was in the office.   CC: cough  History of Present Illness: Patient notes onset of symptoms 3 days ago.  She has had cough with yellow mucus.  No fevers.  She does note some chest congestion.  No shortness of breath or chest pain.  She initially thought it was in her sinuses though it went into her throat and then into her chest.  She notes some back discomfort with coughing though no back discomfort otherwise.  No wheezing.  No travel.  No coronavirus exposure.  She has been taking Benadryl for her sinuses.  No Tylenol or ibuprofen.  Also taking Delsym.  She does have dialysis Tuesday Thursday and Saturday though did not go on Thursday.   Observations/Objective: No physical exam completed given that this is a telehealth visit.  The patient was speaking in full sentences with no obvious signs of dyspnea over the phone.  Assessment and Plan: Bronchitis Symptoms are most consistent with bronchitis.  This could represent a COPD exacerbation though that would seem a little less likely given lack of wheezing and dyspnea.  We will treat with doxycycline.  Given lack of wheezing we will hold on prednisone at this time.  She is given return precautions.    Follow Up Instructions: Patient to follow-up as needed for this issue.  I will have my CMA contact the patient on Monday to see how she is feeling.   I  discussed the assessment and treatment plan with the patient. The patient was provided an opportunity to ask questions and all were answered. The patient agreed with the plan and demonstrated an understanding of the instructions.   The patient was advised to call back or seek an in-person evaluation if the symptoms worsen or if the condition fails to improve as anticipated.  I provided 11 minutes of non-face-to-face time during this encounter.   [name], MD

## 2018-07-11 NOTE — Telephone Encounter (Signed)
Called and spoke with pt. Pt aware that her appt. will be for a virtual visit today at 3:30 PM pt stated that her e-mail was [email or web address]. Appointment information  sent to pt's e-mail advised pt to call back if she does not get the e-mail.   FYI

## 2018-07-11 NOTE — Telephone Encounter (Signed)
Called pt twice and left a detailed VM to call back today appt MUST be a virtual visit and will need pt's email. CRM created and sent to [facility] pool

## 2018-07-12 ENCOUNTER — Telehealth: Payer: Self-pay | Admitting: Family Medicine

## 2018-07-12 NOTE — Telephone Encounter (Signed)
Please contact the patient and see how she is feeling after starting on antibiotics for her cough.  We did a phone visit on Friday.  Thanks.

## 2018-07-12 NOTE — Assessment & Plan Note (Signed)
Symptoms are most consistent with bronchitis.  This could represent a COPD exacerbation though that would seem a little less likely given lack of wheezing and dyspnea.  We will treat with doxycycline.  Given lack of wheezing we will hold on prednisone at this time.  She is given return precautions.

## 2018-07-14 NOTE — Telephone Encounter (Signed)
Noted. I'm glad to hear that she has improved some. I would advise that she continue with the antibiotic and monitor her symptoms over the next several days. If she does not continue to improve she should let us know. Thanks.

## 2018-07-14 NOTE — Telephone Encounter (Signed)
Called and spoke with patient. Pt stated that she does feel that she is getting somewhat better but her cough is still there. Pt is c/o dry cough that is persistent all day long, will occasionally ger mucus out, rattle/crackle in chest, NO wheezing, NO SOB, and NO fever.   Sent to PCP as an [place]

## 2018-07-14 NOTE — Telephone Encounter (Signed)
Called and spoke with pt. Pt advised and voiced understanding.  

## 2018-07-27 ENCOUNTER — Other Ambulatory Visit: Payer: Self-pay | Admitting: Family Medicine

## 2018-09-09 ENCOUNTER — Ambulatory Visit (INDEPENDENT_AMBULATORY_CARE_PROVIDER_SITE_OTHER): Payer: Medicare Other | Admitting: Family Medicine

## 2018-09-09 ENCOUNTER — Other Ambulatory Visit: Payer: Self-pay

## 2018-09-09 ENCOUNTER — Encounter: Payer: Self-pay | Admitting: Family Medicine

## 2018-09-09 DIAGNOSIS — E1121 Type 2 diabetes mellitus with diabetic nephropathy: Secondary | ICD-10-CM

## 2018-09-09 DIAGNOSIS — E1142 Type 2 diabetes mellitus with diabetic polyneuropathy: Secondary | ICD-10-CM

## 2018-09-09 DIAGNOSIS — Z1239 Encounter for other screening for malignant neoplasm of breast: Secondary | ICD-10-CM

## 2018-09-09 DIAGNOSIS — Z853 Personal history of malignant neoplasm of breast: Secondary | ICD-10-CM

## 2018-09-09 DIAGNOSIS — C50412 Malignant neoplasm of upper-outer quadrant of left female breast: Secondary | ICD-10-CM

## 2018-09-09 DIAGNOSIS — R252 Cramp and spasm: Secondary | ICD-10-CM

## 2018-09-09 DIAGNOSIS — B372 Candidiasis of skin and nail: Secondary | ICD-10-CM

## 2018-09-09 DIAGNOSIS — Z794 Long term (current) use of insulin: Secondary | ICD-10-CM

## 2018-09-09 DIAGNOSIS — K625 Hemorrhage of anus and rectum: Secondary | ICD-10-CM

## 2018-09-09 DIAGNOSIS — M546 Pain in thoracic spine: Secondary | ICD-10-CM

## 2018-09-09 NOTE — Progress Notes (Signed)
Virtual Visit via telephone Note  This visit type was conducted due to national recommendations for restrictions regarding the COVID-19 pandemic (e.g. social distancing).  This format is felt to be most appropriate for this patient at this time.  All issues noted in this document were discussed and addressed.  No physical exam was performed (except for noted visual exam findings with Video Visits).   I connected with Jordan [name] today at  2:30 PM EDT by telephone and verified that I am speaking with the correct person using two identifiers. Location patient: home Location provider: work Persons participating in the virtual visit: patient, provider  I discussed the limitations, risks, security and privacy concerns of performing an evaluation and management service by telephone and the availability of in person appointments. I also discussed with the patient that there may be a patient responsible charge related to this service. The patient expressed understanding and agreed to proceed.  Interactive audio and video telecommunications were attempted between this provider and patient, however failed, due to patient having technical difficulties OR patient did not have access to video capability.  We continued and completed visit with audio only.   Reason for visit: follow-up  HPI: Rash: Patient notes she intermittently has a rash underneath her right breast.  Notes it is where her bra rubs against her skin and where it is excessively moist.  Notes it does itch.  No fevers.  Neuropathy: Patient notes she has a lightening sensation in her toes and legs at times.  She has been on gabapentin though is unsure if this is helpful.  Some days she has symptoms and some days she does not.  Back pain: Patient notes it pops when she lays down and notes it hurts when that happens.  It is in her right upper back.  Notes it is a sharp discomfort.  Only occurs when she is laying down.  It has not improved or  worsened.  No radiation.  No numbness associated with this.  No weakness.  No cough.  Bright red blood per rectum: Patient notes she saw pinkish color in the toilet water on one occasion after a bowel movement.  She notes no history of hemorrhoids.  No family history of colon cancer.  It has not recurred.  Muscle cramps: Patient notes this occurs when she is at dialysis.  It occurs in her legs and arms and hands.  She notes the only thing they can do is give her saline though they are trying not to do that as they do not want her to be volume overloaded.   ROS: See pertinent positives and negatives per HPI.  Past Medical History:  Diagnosis Date  . Arthritis   . Breast cancer ([place])   . CAD (coronary artery disease)    a. minimal CAD by cath in 10/2014.  Marland Kitchen CKD (chronic kidney disease)   . COPD (chronic obstructive pulmonary disease) ([place])   . Diabetes mellitus with ESRD (end-stage renal disease) ([place])   . Enterococcal bacteremia 12/26/2014  . GERD (gastroesophageal reflux disease)   . Hypertension   . Renal disorder    Family reports acute renal failure  . Sleep apnea    does not wear CPAP  . Systolic CHF, chronic ([place]) 12/23/2014    Past Surgical History:  Procedure Laterality Date  . ARTERIOVENOUS GRAFT PLACEMENT Right 10/05/15  . AV FISTULA PLACEMENT Right 10/26/2014   Procedure: Right Brachiocephalic Arteriovenous FISTULA CREATION;  Surgeon: Conrad Polk, MD;  [facility];  Service: Vascular;  Laterality: Right;  . AV FISTULA PLACEMENT Right 10/05/2015   Procedure: INSERTION OF  ARTERIOVENOUS (AV) GORE-TEX GRAFT RIGHT ARM;  Surgeon: Conrad Kensington, MD;  Location: [place];  Service: Vascular;  Laterality: Right;  . BASCILIC VEIN TRANSPOSITION Right 04/13/2015   Procedure: RIGHT FIRST STAGE BASCILIC VEIN TRANSPOSITION;  Surgeon: Conrad Newark, MD;  Location: [place];  Service: Vascular;  Laterality: Right;  . BASCILIC VEIN TRANSPOSITION Right 07/11/2015   Procedure: SECOND STAGE BASILIC  VEIN TRANSPOSITION;  Surgeon: Conrad G[place] Park, MD;  Location: [place];  Service: Vascular;  Laterality: Right;  . BREAST SURGERY Left   . CARDIAC CATHETERIZATION [name] 10/19/2014   Procedure: Right/Left Heart Cath and Coronary Angiography;  Surgeon: Peter M Martinique, MD;  Location: [place] [facility];  Service: Cardiovascular;  Laterality: [name];  . FISTULA SUPERFICIALIZATION Right 01/26/2015   Procedure: Right BRACHIOCEPHALIC ARTERIOVENOUS FISTULA SUPERFICIALIZATION with side branch ligation;  Surgeon: Conrad West Laurel, MD;  Location: [place];  Service: Vascular;  Laterality: Right;  . IR DIALY SHUNT INTRO [place] W/IMG RIGHT Right 08/23/2016  . IR GENERIC HISTORICAL  04/30/2016   IR REMOVAL TUN CV CATH W/O FL 04/30/2016 Aletta Edouard, MD [facility]  . IR US GUIDE VASC ACCESS RIGHT  08/23/2016  . LIGATION OF ARTERIOVENOUS  FISTULA Right 03/07/2015   Procedure: LIGATION OF RIGHT BRACHIOCEPHALIC ARTERIOVENOUS  FISTULA;  Surgeon: Conrad Coplay, MD;  Location: [place];  Service: Vascular;  Laterality: Right;  . LIGATION OF ARTERIOVENOUS  FISTULA Right 10/05/2015   Procedure: LIGATION OF RIGHT BASILIC VEIN TRANSPOSITION; EXCISION OF CICATRIX;  Surgeon: Conrad Sasser, MD;  Location: [place];  Service: Vascular;  Laterality: Right;  . PERIPHERAL VASCULAR CATHETERIZATION [name] 01/24/2015   Procedure: Fistulagram;  Surgeon: Conrad Bartley, MD;  Location: [place] [facility];  Service: Cardiovascular;  Laterality: [name];  . PERIPHERAL VASCULAR CATHETERIZATION [name] 08/29/2015   Procedure: Fistulagram;  Surgeon: Conrad Kinder, MD;  Location: [place] [facility];  Service: Cardiovascular;  Laterality: [name];  . PERIPHERAL VASCULAR CATHETERIZATION Right 08/29/2015   Procedure: Peripheral Vascular Balloon Angioplasty;  Surgeon: Conrad Hillsboro, MD;  Location: [place] [facility];  Service: Cardiovascular;  Laterality: Right;  arm fistula  . TEE WITHOUT CARDIOVERSION [name] 12/27/2014   Procedure: TRANSESOPHAGEAL ECHOCARDIOGRAM (TEE);   Surgeon: Thayer Headings, MD;  Location: [place];  Service: Cardiovascular;  Laterality: [name];  . THROMBECTOMY W/ EMBOLECTOMY Right 03/07/2015   Procedure: THROMBECTOMY OF RIGHT BRACHIOCEPHALIC ARTERIOVENOUS FISTULA;  Surgeon: Conrad [place], MD;  Location: [facility] [place];  Service: Vascular;  Laterality: Right;  . THROMBECTOMY W/ EMBOLECTOMY Right 01/27/2016   Procedure: THROMBECTOMY AND REVISION OF RIGHT UPPER ARM ARTERIOVENOUS GORE-TEX GRAFT;  Surgeon: Angelia Mould, MD;  Location: [facility] OR;  Service: Vascular;  Laterality: Right;    Family History  Problem Relation Age of Onset  . Diabetes Mother   . Hypertension Mother   . Stroke Sister   . Hypertension Sister   . Heart attack Neg Hx     SOCIAL HX: Former smoker.   Current Outpatient Medications:  .  albuterol (PROVENTIL HFA;VENTOLIN HFA) 108 (90 Base) MCG/ACT inhaler, Inhale 2 puffs into the lungs every 6 (six) hours as needed for wheezing or shortness of breath., Disp: 1 Inhaler, Rfl: 0 .  aspirin EC 81 MG tablet, Take 81 mg by mouth See admin instructions. Take one tablet (81 mg) by mouth every Monday, Wednesday, Friday and Sunday  morning (non-dialysis days), Disp: , Rfl:  .  atorvastatin (LIPITOR) 40 MG tablet, TAKE 1 TABLET BY MOUTH EVERY DAY AT 6PM, Disp: 90 tablet, Rfl: 0 .  Calcium Carbonate Antacid (TUMS ULTRA 1000 PO), Take 1,000 mg by mouth 3 (three) times daily with meals., Disp: , Rfl:  .  carvedilol (COREG) 6.25 MG tablet, Take 1 tablet (6.25 mg total) by mouth See admin instructions. Take 6.25 mg by mouth twice daily on Monday, Wednesday, Friday and Sunday. (Patient taking differently: Take 6.25 mg by mouth See admin instructions. Take one tablet ( 6.25 mg) by mouth twice daily on Monday, Wednesday, Friday and Sunday. (non-dialysis days)), Disp: 60 tablet, Rfl: 3 .  clotrimazole (LOTRIMIN) 1 % cream, Apply 1 application topically 2 (two) times daily as needed (rash under breasts)., Disp: 30 g, Rfl: 2 .  gabapentin  (NEURONTIN) 300 MG capsule, Take 1 capsule (300 mg total) by mouth See admin instructions. Take one capsule (300 mg) by mouth every Monday, Wednesday, Friday and Sunday morning (non-dialysis days), Disp: 48 capsule, Rfl: 1 .  glucose blood (ONETOUCH VERIO) test strip, Test once daily, Disp: 100 each, Rfl: 12 .  Insulin Pen Needle (BD PEN NEEDLE NANO U/F) 32G X 4 MM MISC, Use as directed, Disp: 100 each, Rfl: 5 .  Lancets (ONETOUCH ULTRASOFT) lancets, Use to check blood sugars 2-3 times daily as directed., Disp: 100 each, Rfl: 12 .  LEVEMIR FLEXTOUCH 100 UNIT/ML Pen, INJECT 55 UNITS INTO THE SKIN AT BEDTIME. (Patient taking differently: Inject 55 Units into the skin every evening. ), Disp: 15 pen, Rfl: 2 .  meclizine (ANTIVERT) 25 MG tablet, Take 1 tablet (25 mg total) by mouth 3 (three) times daily as needed for dizziness., Disp: 30 tablet, Rfl: 0 .  Multiple Vitamin (MULTIVITAMIN WITH MINERALS) TABS tablet, Take 1 tablet by mouth See admin instructions. Take one tablet by mouth every Monday, Wednesday, Friday and Sunday morning (non-dialysis days) - Women's fusion, Disp: , Rfl:  .  omeprazole (PRILOSEC) 40 MG capsule, , Disp: , Rfl:  .  doxycycline (VIBRA-TABS) 100 MG tablet, Take 1 tablet (100 mg total) by mouth 2 (two) times daily. (Patient not taking: Reported on 09/09/2018), Disp: 14 tablet, Rfl: 0 .  ketoconazole (NIZORAL) 2 % cream, Apply 1 application topically daily., Disp: 15 g, Rfl: 0 .  midodrine (PROAMATINE) 5 MG tablet, Take 5 mg by mouth See admin instructions. Take one tablet (5 mg) by mouth during dialysis (Tuesday, Thursday, Saturday) as needed for low blood pressure <79/30, Disp: , Rfl: 11  EXAM: This is a telehealth telephone visit and thus no physical exam was completed.   ASSESSMENT AND PLAN:  Discussed the following assessment and plan:  Diabetic polyneuropathy associated with type 2 diabetes mellitus (HCC)  Candidal intertrigo  Malignant neoplasm of upper-outer quadrant  of left female breast, unspecified estrogen receptor status ([place])  History of breast cancer - Plan: MM DIAG BREAST TOMO UNI RIGHT  Breast cancer screening  Acute right-sided thoracic back pain  Muscle cramps  BRBPR (bright red blood per rectum)  Diabetic neuropathy (HCC) Continues to be an intermittent issue.  I will discuss whether or not Lyrica is a better option with our clinical pharmacist.  Candidal intertrigo Rash underneath breast is likely candidal intertrigo.  If not improving over the several days she will contact us for an exam.  Malignant neoplasm of upper-outer quadrant of left female breast [facility]) Has not seen oncology locally in some time though she did see  oncology in [place] and notes they told her she was cancer free and did not have to follow-up.  I will send my note to her oncologist to determine if she needs follow-up with them.  I will order a right breast mammogram for screening.  Thoracic back pain Recent onset issue.  Discussed altering how she lays down and if persisting over the next week or so she will let us know.  Muscle cramps She will continue to monitor these at dialysis.  BRBPR (bright red blood per rectum) I advised GI evaluation though she declined this given that it only occurred on one occasion.  I did discuss the risk of missing an underlying cancerous cause.  She noted if it recurred she would let me know and be willing to see GI.  CMA will contact the patient to schedule follow-up.  Social distancing precautions and sick precautions given regarding COVID-19.   I discussed the assessment and treatment plan with the patient. The patient was provided an opportunity to ask questions and all were answered. The patient agreed with the plan and demonstrated an understanding of the instructions.   The patient was advised to call back or seek an in-person evaluation if the symptoms worsen or if the condition fails to improve as anticipated.  I  provided 30 minutes of non-face-to-face time during this encounter.   [name], MD

## 2018-09-11 ENCOUNTER — Telehealth: Payer: Self-pay | Admitting: Family Medicine

## 2018-09-11 DIAGNOSIS — B372 Candidiasis of skin and nail: Secondary | ICD-10-CM | POA: Insufficient documentation

## 2018-09-11 DIAGNOSIS — K625 Hemorrhage of anus and rectum: Secondary | ICD-10-CM | POA: Insufficient documentation

## 2018-09-11 DIAGNOSIS — R252 Cramp and spasm: Secondary | ICD-10-CM | POA: Insufficient documentation

## 2018-09-11 MED ORDER — KETOCONAZOLE 2 % EX CREA: 1.0000 "application " | TOPICAL_CREAM | Freq: Every day | CUTANEOUS | 0 refills | Status: AC

## 2018-09-11 MED ORDER — ONETOUCH ULTRASOFT LANCETS MISC: 12 refills | Status: AC

## 2018-09-11 NOTE — Addendum Note (Signed)
Addended by: Caryl Bis, Ashwika Freels G on: 09/11/2018 12:01 PM   Modules accepted: Orders

## 2018-09-11 NOTE — Assessment & Plan Note (Signed)
I advised GI evaluation though she declined this given that it only occurred on one occasion.  I did discuss the risk of missing an underlying cancerous cause.  She noted if it recurred she would let me know and be willing to see GI.

## 2018-09-11 NOTE — Telephone Encounter (Signed)
Please let the patient know that I heard back from Dr. Grayland Ormond who is her oncologist at [facility] and he noted that he typically follows patients with breast cancer such as hers for about 10 years.  I believe they will likely be contacting her to schedule an appointment for follow-up though if I hear otherwise from Dr. Grayland Ormond I will let her know.  If she does not hear anything about an appointment in the next week or 2 she should contact us.  Thanks.

## 2018-09-11 NOTE — Assessment & Plan Note (Signed)
She will continue to monitor these at dialysis.

## 2018-09-11 NOTE — Assessment & Plan Note (Signed)
Has not seen oncology locally in some time though she did see oncology in [place] and notes they told her she was cancer free and did not have to follow-up.  I will send my note to her oncologist to determine if she needs follow-up with them.  I will order a right breast mammogram for screening.

## 2018-09-11 NOTE — Telephone Encounter (Signed)
-----   Message from Lloyd Huger, MD sent at 09/11/2018 11:24 AM EDT ----- Typically I check in with triple negative breast cancer patients once per year for 10 years after diagnosis (I think that would be 2024 for her)  just to keep them at arms length, but still close by.   I can do a lab only follow by a virtual visit in the next several weeks if you want.  -Tim ----- Message ----- From: Leone Haven, MD Sent: 09/11/2018  11:02 AM EDT To: Lloyd Huger, MD  Hi Dr Grayland Ormond,   I saw Mrs Reed-Grier for follow-up this week. She previously established care with you a couple years ago for her breast cancer history. She noted that her oncologist in [place] released her after 5 years of follow-up. I have ordered a right breast mammogram and wanted to see if you felt she should continue to follow with you as she is now living in this area the majority of the time. Please let me know what you think. Thanks for your help.   Randall Hiss

## 2018-09-11 NOTE — Assessment & Plan Note (Signed)
Continues to be an intermittent issue.  I will discuss whether or not Lyrica is a better option with our clinical pharmacist.

## 2018-09-11 NOTE — Assessment & Plan Note (Signed)
Recent onset issue.  Discussed altering how she lays down and if persisting over the next week or so she will let us know.

## 2018-09-11 NOTE — Assessment & Plan Note (Signed)
Rash underneath breast is likely candidal intertrigo.  If not improving over the several days she will contact us for an exam.

## 2018-09-11 NOTE — Progress Notes (Signed)
I have forwarded the message to [facility] for scheduling of New pt appt.

## 2018-09-12 NOTE — Telephone Encounter (Signed)
Called and spoke to pt.  Pt aware of message from PCP's note.  Pt also said that she will be faxing over an application for a handicap sticker for PCP to sign off on.

## 2018-09-15 NOTE — Progress Notes (Signed)
I have called pt twice to schedule appt with Dr. Grayland Ormond. The number keeps saying the person cannot be reached, please try again later. No VM is setup to leave a message.

## 2018-09-16 ENCOUNTER — Telehealth: Payer: Self-pay | Admitting: Family Medicine

## 2018-09-16 NOTE — Telephone Encounter (Signed)
Copied from [place] [id number]. Topic: Quick Communication - Rx Refill/Question >> Sep 16, 2018  3:56 PM [place], [place] D wrote: Medication: gabapentin (NEURONTIN) 300 MG capsule/ glucose blood (ONETOUCH VERIO) test strip / Pt stated she went to the pharmacy today but was only able to pick up her ketoconazole (NIZORAL) 2 % cream. The pharmacy stated Dr. Caryl Bis has to fill out some form for Medicare in order for them to fill her gabapentin and test strips. Pt does not know what form this is. Please advise.  Has the patient contacted their pharmacy? Yes.   (Agent: If no, request that the patient contact the pharmacy for the refill.) (Agent: If yes, when and what did the pharmacy advise?)  Preferred Pharmacy (with phone number or street name): [id number] [place], [place] [phone] (Phone) [phone] (Fax)    Agent: Please be advised that RX refills may take up to 3 business days. We ask that you follow-up with your pharmacy.

## 2018-09-16 NOTE — Telephone Encounter (Signed)
Please let the patient know that I heard back from our pharmacist regarding the gabapentin.  She recommended that the patient take the gabapentin in the evening after dialysis on dialysis days to see if that is more beneficial.  I would suggest that we try that and if that is not beneficial we could try switching her to Lyrica.  Thanks.

## 2018-09-16 NOTE — Telephone Encounter (Signed)
Called Pt and she stated that she will try to start taking the medication in the evening after dialysis first and if it doesn't work she will then try the Lyrica.

## 2018-09-16 NOTE — Telephone Encounter (Signed)
-----   Message from De Hollingshead, [facility] sent at 09/11/2018  1:24 PM EDT ----- Looks like she's taking gabapentin on non-dialysis days? It's actually recommended to give the evening after dialysis on dialysis days, so that would be an option; or you can increase to 300 mg every evening. The worst that could happen is she gets sleepy.   Lyrica is an appropriate option though, if the patient would rather switch to that. I'd do 25 mg every evening.   Catie ----- Message ----- From: Leone Haven, MD Sent: 09/11/2018  10:59 AM EDT To: De Hollingshead, [place] Catie,   Patient has been on gabapentin for her neuropathy.  She is on dialysis.  The gabapentin has not seemed to be effective.  Is Lyrica an adequate option in a dialysis patient?  If so, what would be the dosing for somebody dialysis?  Thanks.  Randall Hiss

## 2018-09-17 ENCOUNTER — Telehealth: Payer: Self-pay | Admitting: *Deleted

## 2018-09-17 NOTE — Telephone Encounter (Signed)
Copied from [place] [phone]. Topic: General - Other >> Sep 17, 2018 10:32 AM Jodie Echevaria wrote: Reason for CRM: Pharmacy called to say that patient insurance will not pay for her glucose testing material because they say that Dr Caryl Bis is not medicare eligible. Please call Adonis Huguenin at Ph# [phone]

## 2018-09-19 ENCOUNTER — Other Ambulatory Visit: Payer: Self-pay

## 2018-09-19 MED ORDER — GABAPENTIN 300 MG PO CAPS: 300.0000 mg | ORAL_CAPSULE | ORAL | 1 refills | Status: DC

## 2018-09-19 NOTE — Telephone Encounter (Signed)
[facility]entin [facility] been [facility] and the strips have been filled for patient.

## 2018-09-22 NOTE — Telephone Encounter (Signed)
Can you please contact the patient's pharmacy and find out what paperwork we need to fill out regarding the gabapentin and the test strips?  Thanks.

## 2018-09-26 ENCOUNTER — Other Ambulatory Visit: Payer: Self-pay

## 2018-09-28 NOTE — Progress Notes (Signed)
  [place]  [phone] [phone]) [phone]  ID: Jordan [name] OB: [birth year]  MR#: [id number]  MHD#:[id number]  Patient Care Team: Leone Haven, MD as PCP - General (Family Medicine) Martinique, Peter M, MD as PCP - Cardiology (Cardiology) Rexene Agent, MD as Attending Physician (Nephrology) Martinique, Peter M, MD as Consulting Physician (Cardiology) [facility], [facility]      This encounter was created in error - please disregard.

## 2018-09-29 ENCOUNTER — Other Ambulatory Visit: Payer: Self-pay

## 2018-09-29 ENCOUNTER — Inpatient Hospital Stay: Payer: Medicare Other | Admitting: Oncology

## 2018-09-29 NOTE — Progress Notes (Signed)
Patient reports rash under breasts, reports it is uncomfortable. She has been using prescription cream.

## 2018-10-22 ENCOUNTER — Other Ambulatory Visit: Payer: Self-pay | Admitting: Family Medicine

## 2018-12-01 ENCOUNTER — Other Ambulatory Visit: Payer: Self-pay | Admitting: Family Medicine

## 2018-12-01 NOTE — Telephone Encounter (Signed)
Patient needs a follow-up visit to consider a refill for this.  We could do it virtually or in person.

## 2018-12-02 ENCOUNTER — Other Ambulatory Visit: Payer: Self-pay

## 2018-12-02 ENCOUNTER — Encounter: Payer: Self-pay | Admitting: Family Medicine

## 2018-12-02 ENCOUNTER — Ambulatory Visit (INDEPENDENT_AMBULATORY_CARE_PROVIDER_SITE_OTHER): Payer: Medicare Other | Admitting: Family Medicine

## 2018-12-02 ENCOUNTER — Other Ambulatory Visit: Payer: Self-pay | Admitting: Family Medicine

## 2018-12-02 DIAGNOSIS — N898 Other specified noninflammatory disorders of vagina: Secondary | ICD-10-CM

## 2018-12-02 MED ORDER — GABAPENTIN 300 MG PO CAPS: 300.0000 mg | ORAL_CAPSULE | ORAL | 1 refills | Status: AC

## 2018-12-02 MED ORDER — FLUCONAZOLE 150 MG PO TABS: 150.0000 mg | ORAL_TABLET | ORAL | 0 refills | Status: AC

## 2018-12-02 NOTE — Telephone Encounter (Signed)
Medication Refill - Medication:  gabapentin (NEURONTIN) 300 MG capsule  Has the patient contacted their pharmacy?  Yes advised to call PCP.  Preferred Pharmacy (with phone number or street name):  [id number] [place], [place] [phone] (Phone) [phone] (Fax)   Agent: Please be advised that RX refills may take up to 3 business days. We ask that you follow-up with your pharmacy.

## 2018-12-02 NOTE — Assessment & Plan Note (Signed)
Suspect vaginal yeast infection.  We will trial of Diflucan for this.  If not improving she will let us know.

## 2018-12-02 NOTE — Progress Notes (Signed)
Virtual Visit via telephone Note  This visit type was conducted due to national recommendations for restrictions regarding the COVID-19 pandemic (e.g. social distancing).  This format is felt to be most appropriate for this patient at this time.  All issues noted in this document were discussed and addressed.  No physical exam was performed (except for noted visual exam findings with Video Visits).   I connected with Jordan [name] today at  4:00 PM EDT by telephone and verified that I am speaking with the correct person using two identifiers. Location patient: home Location provider: work Persons participating in the virtual visit: patient, provider, daughter  I discussed the limitations, risks, security and privacy concerns of performing an evaluation and management service by telephone and the availability of in person appointments. I also discussed with the patient that there may be a patient responsible charge related to this service. The patient expressed understanding and agreed to proceed.  Interactive audio and video telecommunications were attempted between this provider and patient, however failed, due to patient having technical difficulties OR patient did not have access to video capability.  We continued and completed visit with audio only.   Reason for visit: same day  HPI: Vaginal discharge: Patient notes recent onset of vaginal discharge and itching with some burning starting a couple weeks ago.  She notes white vaginal discharge.  She is ESRD on dialysis and does not produce urine other than 1 to 2 drops very occasionally.  No blood from her vagina.  No abdominal pain.  She denies sexual activity.  She has a distant history of yeast infections.   ROS: See pertinent positives and negatives per HPI.  Past Medical History:  Diagnosis Date  . Arthritis   . Breast cancer ([place])   . CAD (coronary artery disease)    a. minimal CAD by cath in 10/2014.  Marland Kitchen CKD (chronic kidney  disease)   . COPD (chronic obstructive pulmonary disease) ([place])   . Diabetes mellitus with ESRD (end-stage renal disease) ([place])   . Enterococcal bacteremia 12/26/2014  . GERD (gastroesophageal reflux disease)   . Hypertension   . Renal disorder    Family reports acute renal failure  . Sleep apnea    does not wear CPAP  . Systolic CHF, chronic ([place]) 12/23/2014    Past Surgical History:  Procedure Laterality Date  . ARTERIOVENOUS GRAFT PLACEMENT Right 10/05/15  . AV FISTULA PLACEMENT Right 10/26/2014   Procedure: Right Brachiocephalic Arteriovenous FISTULA CREATION;  Surgeon: Conrad Manatee, MD;  Location: [facility];  Service: Vascular;  Laterality: Right;  . AV FISTULA PLACEMENT Right 10/05/2015   Procedure: INSERTION OF  ARTERIOVENOUS (AV) GORE-TEX GRAFT RIGHT ARM;  Surgeon: Conrad Cartwright, MD;  Location: [place];  Service: Vascular;  Laterality: Right;  . BASCILIC VEIN TRANSPOSITION Right 04/13/2015   Procedure: RIGHT FIRST STAGE BASCILIC VEIN TRANSPOSITION;  Surgeon: Conrad Marshfield, MD;  Location: [facility];  Service: Vascular;  Laterality: Right;  . BASCILIC VEIN TRANSPOSITION Right 07/11/2015   Procedure: SECOND STAGE BASILIC VEIN TRANSPOSITION;  Surgeon: Conrad Windsor, MD;  Location: [facility];  Service: Vascular;  Laterality: Right;  . BREAST SURGERY Left   . CARDIAC CATHETERIZATION [name] 10/19/2014   Procedure: Right/Left Heart Cath and Coronary Angiography;  Surgeon: Peter M Martinique, MD;  Location: [facility];  Service: Cardiovascular;  Laterality: [name];  . FISTULA SUPERFICIALIZATION Right 01/26/2015   Procedure: Right BRACHIOCEPHALIC ARTERIOVENOUS FISTULA SUPERFICIALIZATION with side branch ligation;  Surgeon: Aaron Edelman  Starlyn Skeans, MD;  Location: [place];  Service: Vascular;  Laterality: Right;  . IR DIALY SHUNT INTRO [place] W/IMG RIGHT Right 08/23/2016  . IR GENERIC HISTORICAL  04/30/2016   IR REMOVAL TUN CV CATH W/O FL 04/30/2016 Aletta Edouard, MD [facility]  . IR US GUIDE VASC ACCESS RIGHT   08/23/2016  . LIGATION OF ARTERIOVENOUS  FISTULA Right 03/07/2015   Procedure: LIGATION OF RIGHT BRACHIOCEPHALIC ARTERIOVENOUS  FISTULA;  Surgeon: Conrad [place], MD;  Location: [facility];  Service: Vascular;  Laterality: Right;  . LIGATION OF ARTERIOVENOUS  FISTULA Right 10/05/2015   Procedure: LIGATION OF RIGHT BASILIC VEIN TRANSPOSITION; EXCISION OF CICATRIX;  Surgeon: Conrad [place], MD;  Location: [facility];  Service: Vascular;  Laterality: Right;  . PERIPHERAL VASCULAR CATHETERIZATION [name] 01/24/2015   Procedure: Fistulagram;  Surgeon: Conrad Moville, MD;  Location: [facility];  Service: Cardiovascular;  Laterality: [name];  . PERIPHERAL VASCULAR CATHETERIZATION [name] 08/29/2015   Procedure: Fistulagram;  Surgeon: Conrad [place], MD;  Location: [facility];  Service: Cardiovascular;  Laterality: [name];  . PERIPHERAL VASCULAR CATHETERIZATION Right 08/29/2015   Procedure: Peripheral Vascular Balloon Angioplasty;  Surgeon: Conrad [place]wood, MD;  Location: [place] [facility];  Service: Cardiovascular;  Laterality: Right;  arm fistula  . TEE WITHOUT CARDIOVERSION [name] 12/27/2014   Procedure: TRANSESOPHAGEAL ECHOCARDIOGRAM (TEE);  Surgeon: Thayer Headings, MD;  Location: [facility];  Service: Cardiovascular;  Laterality: [name];  . THROMBECTOMY W/ EMBOLECTOMY Right 03/07/2015   Procedure: THROMBECTOMY OF RIGHT BRACHIOCEPHALIC ARTERIOVENOUS FISTULA;  Surgeon: Conrad [place], MD;  Location: [facility];  Service: Vascular;  Laterality: Right;  . THROMBECTOMY W/ EMBOLECTOMY Right 01/27/2016   Procedure: THROMBECTOMY AND REVISION OF RIGHT UPPER ARM ARTERIOVENOUS GORE-TEX GRAFT;  Surgeon: Angelia Mould, MD;  Location: [facility];  Service: Vascular;  Laterality: Right;    Family History  Problem Relation Age of Onset  . Diabetes Mother   . Hypertension Mother   . Stroke Sister   . Hypertension Sister   . Heart attack Neg Hx     SOCIAL HX: Former smoker   Current Outpatient Medications:  .  albuterol (PROVENTIL  HFA;VENTOLIN HFA) 108 (90 Base) MCG/ACT inhaler, Inhale 2 puffs into the lungs every 6 (six) hours as needed for wheezing or shortness of breath., Disp: 1 Inhaler, Rfl: 0 .  aspirin EC 81 MG tablet, Take 81 mg by mouth See admin instructions. Take one tablet (81 mg) by mouth every Monday, Wednesday, Friday and Sunday morning (non-dialysis days), Disp: , Rfl:  .  atorvastatin (LIPITOR) 40 MG tablet, TAKE 1 TABLET BY MOUTH EVERY DAY AT 6PM, Disp: 90 tablet, Rfl: 1 .  Calcium Carbonate Antacid (TUMS ULTRA 1000 PO), Take 1,000 mg by mouth 3 (three) times daily with meals., Disp: , Rfl:  .  carvedilol (COREG) 6.25 MG tablet, Take 1 tablet (6.25 mg total) by mouth See admin instructions. Take 6.25 mg by mouth twice daily on Monday, Wednesday, Friday and Sunday. (Patient taking differently: Take 6.25 mg by mouth See admin instructions. Take one tablet ( 6.25 mg) by mouth twice daily on Monday, Wednesday, Friday and Sunday. (non-dialysis days)), Disp: 60 tablet, Rfl: 3 .  clotrimazole (LOTRIMIN) 1 % cream, Apply 1 application topically 2 (two) times daily as needed (rash under breasts)., Disp: 30 g, Rfl: 2 .  fluconazole (DIFLUCAN) 150 MG tablet, Take 1 tablet (150 mg total) by mouth every 3 (three) days., Disp: 2 tablet, Rfl: 0 .  gabapentin (NEURONTIN) 300 MG capsule, Take 1 capsule (300 mg total) by mouth See admin instructions. Take one capsule (300 mg) by mouth every Monday, Wednesday, Friday and Sunday morning (non-dialysis days), Disp: 48 capsule, Rfl: 1 .  glucose blood (ONETOUCH VERIO) test strip, Test once daily, Disp: 100 each, Rfl: 12 .  Insulin Pen Needle (BD PEN NEEDLE NANO U/F) 32G X 4 MM MISC, Use as directed, Disp: 100 each, Rfl: 5 .  ketoconazole (NIZORAL) 2 % cream, Apply 1 application topically daily., Disp: 15 g, Rfl: 0 .  Lancets (ONETOUCH ULTRASOFT) lancets, Use to check blood sugars 2-3 times daily as directed., Disp: 100 each, Rfl: 12 .  LEVEMIR FLEXTOUCH 100 UNIT/ML Pen, INJECT 55 UNITS  INTO THE SKIN AT BEDTIME. (Patient taking differently: Inject 55 Units into the skin every evening. ), Disp: 15 pen, Rfl: 2 .  midodrine (PROAMATINE) 5 MG tablet, Take 5 mg by mouth See admin instructions. Take one tablet (5 mg) by mouth during dialysis (Tuesday, Thursday, Saturday) as needed for low blood pressure <79/30, Disp: , Rfl: 11 .  Multiple Vitamin (MULTIVITAMIN WITH MINERALS) TABS tablet, Take 1 tablet by mouth See admin instructions. Take one tablet by mouth every Monday, Wednesday, Friday and Sunday morning (non-dialysis days) - Women's fusion, Disp: , Rfl:  .  omeprazole (PRILOSEC) 40 MG capsule, , Disp: , Rfl:   EXAM: This is a telehealth telephone visit and thus no physical exam was completed.  ASSESSMENT AND PLAN:  Discussed the following assessment and plan:  Vaginal discharge Suspect vaginal yeast infection.  We will trial of Diflucan for this.  If not improving she will let us know.    I discussed the assessment and treatment plan with the patient. The patient was provided an opportunity to ask questions and all were answered. The patient agreed with the plan and demonstrated an understanding of the instructions.   The patient was advised to call back or seek an in-person evaluation if the symptoms worsen or if the condition fails to improve as anticipated.  I provided 8 minutes of non-face-to-face time during this encounter.   [name], MD

## 2018-12-10 ENCOUNTER — Telehealth: Payer: Self-pay | Admitting: Cardiology

## 2018-12-10 NOTE — Telephone Encounter (Signed)
Can wait to be seen at appointment.  Trinitey Roache Martinique MD, [facility]

## 2018-12-10 NOTE — Telephone Encounter (Signed)
New Message:    Pt says she have been hurting above her breast and pain in her left shoulder. She wanted an appt to be seen- I made her an appt with Eulas Post on 12-26-18. Please call to evaluate and see if pt needs to be seen sooner.

## 2018-12-10 NOTE — Telephone Encounter (Signed)
Spoke with pt who report for the past month she has been experiencing pain under her left arm, on the side where she had her mastectomy. She report pain is ongoing and only eased with tylenol. She report pcp referred her to see a new oncologist but was informed by their office that pt could not be seen until they received records from oncologists in [place]. Pt report symptoms may not be related to her heart but would like an appointment to confirm.   Appointment scheduled for next Wednesday 9/1 at 10:15. Will route to MD for any further recommendations.

## 2018-12-16 NOTE — Progress Notes (Signed)
Cardiology Office Note:    Date:  12/17/2018   ID:  Haelee Bolen [name], DOB 1940/12/19, MRN [id number]  PCP:  Leone Haven, MD  Cardiologist:  Peter Martinique, MD    Referring MD: Leone Haven, MD   Pain under left arm  History of Present Illness:    Jordan [name] is a 78 y.o. female with a hx of chronic diastolic heart failure, hypertension, nonobstructive CAD by heart cath (2016) with severe pulmonary hypertension and normal cardiac output, LBBB, ESRD on HD (TThS), hyperlipidemia, type II DM, chronic respiratory failure with home O2, and remote history of breast cancer (2014). She was last seen by Jory Sims NP 06/11/18 for palpitations/PVCs during HD. No change in therapy.   She called our office stating she was having pain near her left mastectomy site and under her left arm.  Her PCP referred her to a new oncologist - hasn't been seen yet, oncology records pending from [facility]. Pt wants to make sure her symptoms are not related to her heart.   She presents to the clinic today and states she has noticed pain under her left arm for the last several months.  She states that she has taken Tylenol which has not provided much relief.  She becomes tearful when reaching overhead and  points to her axillary region when asked if she can locate the pain.  She also states that she did not take her carvedilol evening dose or morning dose this morning.  Her EKG shows several PVCs however, she states she only intermittently notices a racing heart rate.  She goes on to say that she has not been very active.  She lives in a second story apartment where she has to climb the stairs and she does not currently do any other exercise.  She is in the process of returning to [place] where she has her primary home but, only plans to stay a few months before she returns to [place].  She denies chest pain, increased shortness of breath, increased lower extremity edema, fatigue, increased  palpitations, melena, hematuria, hemoptysis, diaphoresis, weakness, presyncope, syncope, orthopnea, and PND.   Past Medical History:  Diagnosis Date  . Arthritis   . Breast cancer ([place])   . CAD (coronary artery disease)    a. minimal CAD by cath in 10/2014.  Marland Kitchen CKD (chronic kidney disease)   . COPD (chronic obstructive pulmonary disease) ([place])   . Diabetes mellitus with ESRD (end-stage renal disease) ([place])   . Enterococcal bacteremia 12/26/2014  . GERD (gastroesophageal reflux disease)   . Hypertension   . Renal disorder    Family reports acute renal failure  . Sleep apnea    does not wear CPAP  . Systolic CHF, chronic ([place]) 12/23/2014    Past Surgical History:  Procedure Laterality Date  . ARTERIOVENOUS GRAFT PLACEMENT Right 10/05/15  . AV FISTULA PLACEMENT Right 10/26/2014   Procedure: Right Brachiocephalic Arteriovenous FISTULA CREATION;  Surgeon: Conrad Taylor, MD;  Location: [place];  Service: Vascular;  Laterality: Right;  . AV FISTULA PLACEMENT Right 10/05/2015   Procedure: INSERTION OF  ARTERIOVENOUS (AV) GORE-TEX GRAFT RIGHT ARM;  Surgeon: Conrad Glenwood, MD;  Location: [place];  Service: Vascular;  Laterality: Right;  . BASCILIC VEIN TRANSPOSITION Right 04/13/2015   Procedure: RIGHT FIRST STAGE BASCILIC VEIN TRANSPOSITION;  Surgeon: Conrad [place], MD;  Location: [place];  Service: Vascular;  Laterality: Right;  . BASCILIC VEIN TRANSPOSITION Right 07/11/2015   Procedure: SECOND  STAGE BASILIC VEIN TRANSPOSITION;  Surgeon: Conrad [place], MD;  Location: [place];  Service: Vascular;  Laterality: Right;  . BREAST SURGERY Left   . CARDIAC CATHETERIZATION [name] 10/19/2014   Procedure: Right/Left Heart Cath and Coronary Angiography;  Surgeon: Peter M Martinique, MD;  Location: [place] [facility];  Service: Cardiovascular;  Laterality: [name];  . FISTULA SUPERFICIALIZATION Right 01/26/2015   Procedure: Right BRACHIOCEPHALIC ARTERIOVENOUS FISTULA SUPERFICIALIZATION with side branch ligation;  Surgeon: Conrad [place], MD;  Location: [place];  Service: Vascular;  Laterality: Right;  . IR DIALY SHUNT INTRO [place] W/IMG RIGHT Right 08/23/2016  . IR GENERIC HISTORICAL  04/30/2016   IR REMOVAL TUN CV CATH W/O FL 04/30/2016 Aletta Edouard, MD [facility]-INTER[facility]  . IR US GUIDE VASC ACCESS RIGHT  08/23/2016  . LIGATION OF ARTERIOVENOUS  FISTULA Right 03/07/2015   Procedure: LIGATION OF RIGHT BRACHIOCEPHALIC ARTERIOVENOUS  FISTULA;  Surgeon: Conrad [place], MD;  Location: [place];  Service: Vascular;  Laterality: Right;  . LIGATION OF ARTERIOVENOUS  FISTULA Right 10/05/2015   Procedure: LIGATION OF RIGHT BASILIC VEIN TRANSPOSITION; EXCISION OF CICATRIX;  Surgeon: Conrad Monte Sereno, MD;  Location: [place];  Service: Vascular;  Laterality: Right;  . PERIPHERAL VASCULAR CATHETERIZATION [name] 01/24/2015   Procedure: Fistulagram;  Surgeon: Conrad Albertson, MD;  Location: [place] [facility];  Service: Cardiovascular;  Laterality: [name];  . PERIPHERAL VASCULAR CATHETERIZATION [name] 08/29/2015   Procedure: Fistulagram;  Surgeon: Conrad [place], MD;  Location: [place] [facility];  Service: Cardiovascular;  Laterality: [name];  . PERIPHERAL VASCULAR CATHETERIZATION Right 08/29/2015   Procedure: Peripheral Vascular Balloon Angioplasty;  Surgeon: Conrad [place], MD;  Location: [place] [facility];  Service: Cardiovascular;  Laterality: Right;  arm fistula  . TEE WITHOUT CARDIOVERSION [name] 12/27/2014   Procedure: TRANSESOPHAGEAL ECHOCARDIOGRAM (TEE);  Surgeon: Thayer Headings, MD;  Location: [place];  Service: Cardiovascular;  Laterality: [name];  . THROMBECTOMY W/ EMBOLECTOMY Right 03/07/2015   Procedure: THROMBECTOMY OF RIGHT BRACHIOCEPHALIC ARTERIOVENOUS FISTULA;  Surgeon: Conrad Henderson, MD;  Location: [place];  Service: Vascular;  Laterality: Right;  . THROMBECTOMY W/ EMBOLECTOMY Right 01/27/2016   Procedure: THROMBECTOMY AND REVISION OF RIGHT UPPER ARM ARTERIOVENOUS GORE-TEX GRAFT;  Surgeon: Angelia Mould, MD;  [facility];  Service:  Vascular;  Laterality: Right;    Current Medications: Current Meds  Medication Sig  . albuterol (PROVENTIL HFA;VENTOLIN HFA) 108 (90 Base) MCG/ACT inhaler Inhale 2 puffs into the lungs every 6 (six) hours as needed for wheezing or shortness of breath.  Marland Kitchen aspirin EC 81 MG tablet Take 81 mg by mouth See admin instructions. Take one tablet (81 mg) by mouth every Monday, Wednesday, Friday and Sunday morning (non-dialysis days)  . atorvastatin (LIPITOR) 40 MG tablet TAKE 1 TABLET BY MOUTH EVERY DAY AT 6PM  . Calcium Carbonate Antacid (TUMS ULTRA 1000 PO) Take 1,000 mg by mouth 3 (three) times daily with meals.  . carvedilol (COREG) 6.25 MG tablet Take 1 tablet (6.25 mg total) by mouth See admin instructions. Take 6.25 mg by mouth twice daily on Monday, Wednesday, Friday and Sunday. (Patient taking differently: Take 6.25 mg by mouth See admin instructions. Take one tablet ( 6.25 mg) by mouth twice daily on Monday, Wednesday, Friday and Sunday. (non-dialysis days))  . clotrimazole (LOTRIMIN) 1 % cream Apply 1 application topically 2 (two) times daily as needed (rash under breasts).  . fluconazole (DIFLUCAN) 150 MG tablet Take 1 tablet (150 mg total)  by mouth every 3 (three) days.  Marland Kitchen gabapentin (NEURONTIN) 300 MG capsule Take 1 capsule (300 mg total) by mouth See admin instructions. Take one capsule (300 mg) by mouth every Monday, Wednesday, Friday and Sunday morning (non-dialysis days)  . glucose blood (ONETOUCH VERIO) test strip Test once daily  . Insulin Pen Needle (BD PEN NEEDLE NANO U/F) 32G X 4 MM MISC Use as directed  . ketoconazole (NIZORAL) 2 % cream Apply 1 application topically daily.  . Lancets (ONETOUCH ULTRASOFT) lancets Use to check blood sugars 2-3 times daily as directed.  Marland Kitchen LEVEMIR FLEXTOUCH 100 UNIT/ML Pen INJECT 55 UNITS INTO THE SKIN AT BEDTIME. (Patient taking differently: Inject 55 Units into the skin every evening. )  . midodrine (PROAMATINE) 5 MG tablet Take 5 mg by mouth See  admin instructions. Take one tablet (5 mg) by mouth during dialysis (Tuesday, Thursday, Saturday) as needed for low blood pressure <79/30  . Multiple Vitamin (MULTIVITAMIN WITH MINERALS) TABS tablet Take 1 tablet by mouth See admin instructions. Take one tablet by mouth every Monday, Wednesday, Friday and Sunday morning (non-dialysis days) - Women's fusion  . omeprazole (PRILOSEC) 40 MG capsule      Allergies:   Vicodin [hydrocodone-acetaminophen] and Adhesive [tape]   Social History   Socioeconomic History  . Marital status: Widowed    Spouse name: Not on file  . Number of children: Not on file  . Years of education: Not on file  . Highest education level: Not on file  Occupational History  . Not on file  Social Needs  . Financial resource strain: Not on file  . Food insecurity    Worry: Not on file    Inability: Not on file  . Transportation needs    Medical: Not on file    Non-medical: Not on file  Tobacco Use  . Smoking status: Former Research scientist (life sciences)  . Smokeless tobacco: Never Used  Substance and Sexual Activity  . Alcohol use: No    Alcohol/week: 0.0 standard drinks  . Drug use: No  . Sexual activity: Not on file  Lifestyle  . Physical activity    Days per week: Not on file    Minutes per session: Not on file  . Stress: Not on file  Relationships  . Social Herbalist on phone: Not on file    Gets together: Not on file    Attends religious service: Not on file    Active member of club or organization: Not on file    Attends meetings of clubs or organizations: Not on file    Relationship status: Not on file  Other Topics Concern  . Not on file  Social History Narrative  . Not on file     Family History: The patient's family history includes Diabetes in her mother; Hypertension in her mother and sister; Stroke in her sister. There is no history of Heart attack.  ROS:   Please see the history of present illness.     All other systems reviewed and are  negative.  EKGs/Labs/Other Studies Reviewed:    The following studies were reviewed today:  Echocardiogram 03/05/2018  Left ventricle: The cavity size was mildly dilated. Wall thickness was increased in a pattern of mild LVH. Systolic function was normal. The estimated ejection fraction was in the range of 50% to 55%. The study is not technically sufficient to allow evaluation of LV diastolic function. - Aortic valve: Calcified mild AS poorly visualize cannot tell number of  leaflets. Valve area (VTI): 1.42 cm^2. Valve area (Vmean): 1.43 cm^2. - Mitral valve: Calcified annulus. Valve area by continuity equation (using LVOT flow): 1.25 cm^2. - Left atrium: The atrium was moderately dilated. - Impressions: If clinical suspicion for SBE high consider TEE.  Cardiac cath 10/19/2014 Conclusion    Mild nonobstructive CAD  Severe pulmonary HTN  Elevated LV filling pressures.  Normal cardiac output  Recommendations: continue medical therapy. Needs additional IV diuresis.     EKG:  EKG is ordered today.  The ekg ordered today demonstrates sinus rhythm with first-degree AV block with frequent premature ventricular complexes and fusion complexes-no acute changes  Recent Labs: 04/20/2018: B Natriuretic Peptide 884.2 05/29/2018: BUN 13; Creatinine, Ser 4.73; Hemoglobin 9.9; Magnesium 1.7; Platelets 189; Potassium 3.3; Sodium 136  Recent Lipid Panel    Component Value Date/Time   CHOL 199 10/12/2014 0745   TRIG 113 10/12/2014 0745   HDL 53 10/12/2014 0745   CHOLHDL 3.8 10/12/2014 0745   VLDL 23 10/12/2014 0745   LDLCALC 123 (H) 10/12/2014 0745    Physical Exam:    VS:  BP 130/60 (BP Location: Right Wrist, Patient Position: Sitting, Cuff Size: Normal)   Pulse 92   Ht 5\' 5"  (1.651 m)   Wt 271 lb 4 oz (123 kg)   BMI 45.14 kg/m     Wt Readings from Last 3 Encounters:  12/17/18 271 lb 4 oz (123 kg)  12/02/18 287 lb (130.2 kg)  06/11/18 273 lb 6.4 oz (124 kg)      GEN:  Well nourished, well developed in no acute distress HEENT: Normal NECK: No JVD; No carotid bruits LYMPHATICS: No lymphadenopathy CARDIAC: RRR, no murmurs, rubs, gallops RESPIRATORY:  Clear to auscultation without rales, wheezing or rhonchi  ABDOMEN: Soft, non-tender, non-distended MUSCULOSKELETAL:  No edema; No deformity  SKIN: Warm and dry NEUROLOGIC:  Alert and oriented x 3 PSYCHIATRIC:  Normal affect   ASSESSMENT/Plan   1.  Coronary artery disease  - nonobstructive by cath in 2016, no chest pain today.  Left arm pain appears to be muscular in nature.  Her pain is reproduced through arm extension overhead. Continue 81 mg aspirin daily  Continue atorvastatin 40 mg tablet daily  Continue carvedilol 6.25 mg twice daily on Monday Wednesday Friday and Sunday-patient educated about importance of taking medication as prescribed.  She missed evening and this morning's dose of carvedilol. Increase physical activity as tolerated Maintain a heart healthy low-sodium diet Keep appointment with new oncologist  2.  Chronic diastolic heart failure- echocardiogram 02/2018 LVEF 50 to 55%, left ventricle mildly dilated, mild LVH, left atrium moderately dilated.  No increased shortness of breath or activity intolerance Continue carvedilol 6.25 mg twice daily on Monday Wednesday Friday and Sunday. Low-sodium heart healthy diet Increase physical activity as tolerated   3.  Essential hypertension- 130/60 today, well controlled Continue carvedilol 6.25 mg twice daily on Monday Wednesday Friday and Sunday. Low-sodium heart healthy diet Increase physical activity as tolerated    Medication Adjustments/Labs and Tests Ordered: Current medicines are reviewed at length with the patient today.  Concerns regarding medicines are outlined above.  Orders Placed This Encounter  Procedures  . EKG 12-Lead   No orders of the defined types were placed in this encounter.  Disposition: Follow-up with Dr.  Martinique in 6 months.  Alicia Amel NP-C 12/17/2018 11:05 AM    [facility]

## 2018-12-17 ENCOUNTER — Ambulatory Visit (INDEPENDENT_AMBULATORY_CARE_PROVIDER_SITE_OTHER): Payer: Medicare Other | Admitting: General Practice

## 2018-12-17 ENCOUNTER — Other Ambulatory Visit: Payer: Self-pay

## 2018-12-17 ENCOUNTER — Encounter: Payer: Self-pay | Admitting: Physician Assistant

## 2018-12-17 VITALS — BP 130/60 | HR 92 | Ht 65.0 in | Wt 271.2 lb

## 2018-12-17 DIAGNOSIS — I2510 Atherosclerotic heart disease of native coronary artery without angina pectoris: Secondary | ICD-10-CM

## 2018-12-17 DIAGNOSIS — I428 Other cardiomyopathies: Secondary | ICD-10-CM | POA: Diagnosis not present

## 2018-12-17 DIAGNOSIS — I5022 Chronic systolic (congestive) heart failure: Secondary | ICD-10-CM | POA: Diagnosis not present

## 2018-12-17 DIAGNOSIS — I10 Essential (primary) hypertension: Secondary | ICD-10-CM

## 2018-12-17 NOTE — Patient Instructions (Signed)
Medication Instructions:  Your physician recommends that you continue on your current medications as directed. Please refer to the Current Medication list given to you today.  If you need a refill on your cardiac medications before your next appointment, please call your pharmacy.    Follow-Up: At [facility] you and your health needs are our priority.  As part of our continuing mission to provide you with exceptional heart care, we have created designated Provider Care Teams.  These Care Teams include your primary Cardiologist (physician) and Advanced Practice Providers (APPs -  Physician Assistants and Nurse Practitioners) who all work together to provide you with the care you need, when you need it. You will need a follow up appointment in 6 months.  Please call our office 2 months in advance to schedule this appointment.  You may see Peter Martinique, MD or one of the following Advanced Practice Providers on your designated Care Team: [place], [place] . Fabian Sharp, PA-C

## 2018-12-24 NOTE — Telephone Encounter (Signed)
Appointment scheduled with Coletta Memos NP 12/17/18.

## 2018-12-26 ENCOUNTER — Ambulatory Visit: Payer: Medicare Other | Admitting: Physician Assistant

## 2019-01-01 ENCOUNTER — Other Ambulatory Visit: Payer: Self-pay

## 2019-01-01 ENCOUNTER — Telehealth: Payer: Self-pay | Admitting: Family Medicine

## 2019-01-01 ENCOUNTER — Ambulatory Visit (INDEPENDENT_AMBULATORY_CARE_PROVIDER_SITE_OTHER): Payer: Medicare Other

## 2019-01-01 DIAGNOSIS — Z0000 Encounter for general adult medical examination without abnormal findings: Secondary | ICD-10-CM

## 2019-01-01 DIAGNOSIS — Z0279 Encounter for issue of other medical certificate: Secondary | ICD-10-CM

## 2019-01-01 NOTE — Telephone Encounter (Signed)
Pt daughter dropped off handicap parking forms to be filled out. Placed in Dr. Ellen Henri color folder upfront. Please contact pt daughter when complete at [phone]

## 2019-01-01 NOTE — Patient Instructions (Addendum)
  Ms. Reed-Grier , Thank you for taking time to come for your Medicare Wellness Visit. I appreciate your ongoing commitment to your health goals. Please review the following plan we discussed and let me know if I can assist you in the future.   These are the goals we discussed: Goals    . Follow up with Primary Care Provider     Complete health maintenance gaps        This is a list of the screening recommended for you and due dates:  Health Maintenance  Topic Date Due  . Urine Protein Check  04/14/1951  . Eye exam for diabetics  12/19/2017  . Hemoglobin A1C  09/02/2018  . Flu Shot  07/15/2019*  . DEXA scan (bone density measurement)  01/01/2020*  . Colon Cancer Screening  01/01/2020*  . Tetanus Vaccine  01/01/2020*  . Complete foot exam   04/03/2019  . Pneumonia vaccines (2 of 2 - PCV13) 04/22/2019  *Topic was postponed. The date shown is not the original due date.

## 2019-01-01 NOTE — Progress Notes (Signed)
Subjective:   Jordan [name] is a 78 y.o. female who presents for an Initial Medicare Annual Wellness Visit.  Review of Systems    No ROS.  Medicare Wellness Virtual Visit.  Visual/audio telehealth visit, UTA vital signs.   See social history for additional risk factors.    Cardiac Risk Factors include: advanced age (>79men, >23 women);hypertension;diabetes mellitus     Objective:    Today's Vitals   There is no height or weight on file to calculate BMI.  Advanced Directives 01/01/2019 09/29/2018 05/29/2018 04/18/2018 03/03/2018 03/03/2018 11/21/2016  Does Patient Have a Medical Advance Directive? No No No No No No No  Would patient like information on creating a medical advance directive? No - Patient declined No - Patient declined No - Patient declined - No - Patient declined - No - Patient declined    Current Medications (verified) Outpatient Encounter Medications as of 01/01/2019  Medication Sig  . albuterol (PROVENTIL HFA;VENTOLIN HFA) 108 (90 Base) MCG/ACT inhaler Inhale 2 puffs into the lungs every 6 (six) hours as needed for wheezing or shortness of breath.  Marland Kitchen aspirin EC 81 MG tablet Take 81 mg by mouth See admin instructions. Take one tablet (81 mg) by mouth every Monday, Wednesday, Friday and Sunday morning (non-dialysis days)  . atorvastatin (LIPITOR) 40 MG tablet TAKE 1 TABLET BY MOUTH EVERY DAY AT 6PM  . Calcium Carbonate Antacid (TUMS ULTRA 1000 PO) Take 1,000 mg by mouth 3 (three) times daily with meals.  . carvedilol (COREG) 6.25 MG tablet Take 1 tablet (6.25 mg total) by mouth See admin instructions. Take 6.25 mg by mouth twice daily on Monday, Wednesday, Friday and Sunday. (Patient taking differently: Take 6.25 mg by mouth See admin instructions. Take one tablet ( 6.25 mg) by mouth twice daily on Monday, Wednesday, Friday and Sunday. (non-dialysis days))  . clotrimazole (LOTRIMIN) 1 % cream Apply 1 application topically 2 (two) times daily as needed (rash under  breasts).  . fluconazole (DIFLUCAN) 150 MG tablet Take 1 tablet (150 mg total) by mouth every 3 (three) days.  Marland Kitchen gabapentin (NEURONTIN) 300 MG capsule Take 1 capsule (300 mg total) by mouth See admin instructions. Take one capsule (300 mg) by mouth every Monday, Wednesday, Friday and Sunday morning (non-dialysis days)  . glucose blood (ONETOUCH VERIO) test strip Test once daily  . Insulin Pen Needle (BD PEN NEEDLE NANO U/F) 32G X 4 MM MISC Use as directed  . ketoconazole (NIZORAL) 2 % cream Apply 1 application topically daily.  . Lancets (ONETOUCH ULTRASOFT) lancets Use to check blood sugars 2-3 times daily as directed.  Marland Kitchen LEVEMIR FLEXTOUCH 100 UNIT/ML Pen INJECT 55 UNITS INTO THE SKIN AT BEDTIME. (Patient taking differently: Inject 55 Units into the skin every evening. )  . midodrine (PROAMATINE) 5 MG tablet Take 5 mg by mouth See admin instructions. Take one tablet (5 mg) by mouth during dialysis (Tuesday, Thursday, Saturday) as needed for low blood pressure <79/30  . Multiple Vitamin (MULTIVITAMIN WITH MINERALS) TABS tablet Take 1 tablet by mouth See admin instructions. Take one tablet by mouth every Monday, Wednesday, Friday and Sunday morning (non-dialysis days) - [facility]  . omeprazole (PRILOSEC) 40 MG capsule    No facility-administered encounter medications on file as of 01/01/2019.     Allergies (verified) Vicodin [hydrocodone-acetaminophen] and Adhesive [tape]   History: Past Medical History:  Diagnosis Date  . Arthritis   . Breast cancer ([place])   . CAD (coronary artery disease)  a. minimal CAD by cath in 10/2014.  Marland Kitchen CKD (chronic kidney disease)   . COPD (chronic obstructive pulmonary disease) ([place])   . Diabetes mellitus with ESRD (end-stage renal disease) ([place])   . Enterococcal bacteremia 12/26/2014  . GERD (gastroesophageal reflux disease)   . Hypertension   . Renal disorder    Family reports acute renal failure  . Sleep apnea    does not wear CPAP  . Systolic CHF,  chronic ([place]) 12/23/2014   Past Surgical History:  Procedure Laterality Date  . ARTERIOVENOUS GRAFT PLACEMENT Right 10/05/15  . AV FISTULA PLACEMENT Right 10/26/2014   Procedure: Right Brachiocephalic Arteriovenous FISTULA CREATION;  Surgeon: Conrad [place], MD;  Location: [place];  Service: Vascular;  Laterality: Right;  . AV FISTULA PLACEMENT Right 10/05/2015   Procedure: INSERTION OF  ARTERIOVENOUS (AV) GORE-TEX GRAFT RIGHT ARM;  Surgeon: Conrad [place], MD;  Location: [place];  Service: Vascular;  Laterality: Right;  . BASCILIC VEIN TRANSPOSITION Right 04/13/2015   Procedure: RIGHT FIRST STAGE BASCILIC VEIN TRANSPOSITION;  Surgeon: Conrad Heeia, MD;  Location: [place];  Service: Vascular;  Laterality: Right;  . BASCILIC VEIN TRANSPOSITION Right 07/11/2015   Procedure: SECOND STAGE BASILIC VEIN TRANSPOSITION;  Surgeon: Conrad Austell, MD;  Location: [place];  Service: Vascular;  Laterality: Right;  . BREAST SURGERY Left   . CARDIAC CATHETERIZATION [name] 10/19/2014   Procedure: Right/Left Heart Cath and Coronary Angiography;  Surgeon: Peter M Martinique, MD;  Location: [place] [facility];  Service: Cardiovascular;  Laterality: [name];  . FISTULA SUPERFICIALIZATION Right 01/26/2015   Procedure: Right BRACHIOCEPHALIC ARTERIOVENOUS FISTULA SUPERFICIALIZATION with side branch ligation;  Surgeon: Conrad [place], MD;  Location: [place];  Service: Vascular;  Laterality: Right;  . IR DIALY SHUNT INTRO [place] W/IMG RIGHT Right 08/23/2016  . IR GENERIC HISTORICAL  04/30/2016   IR REMOVAL TUN CV CATH W/O FL 04/30/2016 Aletta Edouard, MD [facility]-INTERV RAD  . IR US GUIDE VASC ACCESS RIGHT  08/23/2016  . LIGATION OF ARTERIOVENOUS  FISTULA Right 03/07/2015   Procedure: LIGATION OF RIGHT BRACHIOCEPHALIC ARTERIOVENOUS  FISTULA;  Surgeon: Conrad Manchester, MD;  Location: [place];  Service: Vascular;  Laterality: Right;  . LIGATION OF ARTERIOVENOUS  FISTULA Right 10/05/2015   Procedure: LIGATION OF RIGHT BASILIC VEIN TRANSPOSITION;  EXCISION OF CICATRIX;  Surgeon: Conrad Guion, MD;  Location: [place];  Service: Vascular;  Laterality: Right;  . PERIPHERAL VASCULAR CATHETERIZATION [name] 01/24/2015   Procedure: Fistulagram;  Surgeon: Conrad [place], MD;  Location: [facility] [facility];  Service: Cardiovascular;  Laterality: [name];  . PERIPHERAL VASCULAR CATHETERIZATION [name] 08/29/2015   Procedure: Fistulagram;  Surgeon: Conrad Alvin, MD;  Location: [place] [facility];  Service: Cardiovascular;  Laterality: [name];  . PERIPHERAL VASCULAR CATHETERIZATION Right 08/29/2015   Procedure: Peripheral Vascular Balloon Angioplasty;  Surgeon: Conrad [place], MD;  Location: [place] [facility];  Service: Cardiovascular;  Laterality: Right;  arm fistula  . TEE WITHOUT CARDIOVERSION [name] 12/27/2014   Procedure: TRANSESOPHAGEAL ECHOCARDIOGRAM (TEE);  Surgeon: Thayer Headings, MD;  Location: [place];  Service: Cardiovascular;  Laterality: [name];  . THROMBECTOMY W/ EMBOLECTOMY Right 03/07/2015   Procedure: THROMBECTOMY OF RIGHT BRACHIOCEPHALIC ARTERIOVENOUS FISTULA;  Surgeon: Conrad Spencer, MD;  Location: [place];  Service: Vascular;  Laterality: Right;  . THROMBECTOMY W/ EMBOLECTOMY Right 01/27/2016   Procedure: THROMBECTOMY AND REVISION OF RIGHT UPPER ARM ARTERIOVENOUS GORE-TEX GRAFT;  Surgeon: Angelia Mould, MD;  Location: [place];  Service: Vascular;  Laterality: Right;   Family History  Problem Relation Age of Onset  . Diabetes Mother   . Hypertension Mother   . Stroke Sister   . Hypertension Sister   . Heart attack Neg Hx    Social History   Socioeconomic History  . Marital status: Widowed    Spouse name: Not on file  . Number of children: Not on file  . Years of education: Not on file  . Highest education level: Not on file  Occupational History  . Not on file  Social Needs  . Financial resource strain: Not hard at all  . Food insecurity    Worry: Never true    Inability: Never true  . Transportation needs    Medical: No     Non-medical: No  Tobacco Use  . Smoking status: Former Research scientist (life sciences)  . Smokeless tobacco: Never Used  Substance and Sexual Activity  . Alcohol use: No    Alcohol/week: 0.0 standard drinks  . Drug use: No  . Sexual activity: Not on file  Lifestyle  . Physical activity    Days per week: 2 days    Minutes per session: 20 min  . Stress: Not at all  Relationships  . Social Herbalist on phone: Not on file    Gets together: Not on file    Attends religious service: Not on file    Active member of club or organization: Not on file    Attends meetings of clubs or organizations: Not on file    Relationship status: Not on file  Other Topics Concern  . Not on file  Social History Narrative  . Not on file    Tobacco Counseling Counseling given: Not Answered   Clinical Intake:  Pre-visit preparation completed: Yes        Diabetes: No  How often do you need to have someone help you when you read instructions, pamphlets, or other written materials from your doctor or pharmacy?: 1 - Never  Interpreter Needed?: No      Activities of Daily Living In your present state of health, do you have any difficulty performing the following activities: 01/01/2019 04/19/2018  Hearing? N N  Vision? N N  Difficulty concentrating or making decisions? N N  Walking or climbing stairs? N Y  Dressing or bathing? N N  Doing errands, shopping? Y N  Preparing Food and eating ? N -  Using the Toilet? N -  In the past six months, have you accidently leaked urine? N -  Do you have problems with loss of bowel control? N -  Managing your Medications? N -  Managing your Finances? N -  Housekeeping or managing your Housekeeping? N -  Some recent data might be hidden     Immunizations and Health Maintenance Immunization History  Administered Date(s) Administered  . Influenza, High Dose Seasonal PF 01/14/2018  . Pneumococcal Polysaccharide-23 04/21/2018   Health Maintenance Due  Topic Date  Due  . URINE MICROALBUMIN  04/14/1951  . OPHTHALMOLOGY EXAM  12/19/2017  . HEMOGLOBIN A1C  09/02/2018    Patient Care Team: Leone Haven, MD as PCP - General (Family Medicine) Martinique, Peter M, MD as PCP - Cardiology (Cardiology) Rexene Agent, MD as Attending Physician (Nephrology) Martinique, Peter M, MD as Consulting Physician (Cardiology) [facility] any recent Medical Services you may have received from other than [facility] providers in the past year (date  may be approximate).     Assessment:   This is a routine wellness examination for [facility].  I connected with patient 01/01/19 at 12:30 PM EDT by an audio enabled telemedicine application and verified that I am speaking with the correct person using two identifiers. Patient stated full name and DOB. Patient gave permission to continue with virtual visit. Patient's location was at home and Nurse's location was at [place] office.   Health Maintenance Due: Influenza vaccine 2020- discussed; to be completed in season with doctor or local pharmacy.   Tdap- discussed; to be completed with doctor in visit or local pharmacy.  Dexa- deferred per patient preference.  Colonoscopy- aged out. Cologuard deferred.  A1c- 03/04/18 (6.4) Update all pending maintenance due as appropriate.   See completed HM at the end of note.   Eye: Visual acuity not assessed. Virtual visit. Retinopathy- none reported  Dental: Dental- she plans to schedule  Hearing: Demonstrates normal hearing during visit. Hearing loss L ear; she plans to schedule   Safety:  Patient feels safe at home- yes Patient does have smoke detectors at home- yes Patient does wear sunscreen or protective clothing when in direct sunlight - yes Patient does wear seat belt when in a moving vehicle - yes Patient drives- no Adequate lighting in walkways free from debris- yes Grab bars and handrails used as appropriate- yes Ambulates with an  assistive device- walker Cell phone or lifeline/life alert/medic alert on person when ambulating outside of the home- yes  Social: Alcohol intake - no  Smoking history- former   Smokers in home? none Illicit drug use? none  Depression: PHQ 2 &9 complete. See screening below. Denies irritability, anhedonia, sadness/tearfullness.  Stable.   Falls: See screening below.    Medication: Taking as directed and without issues.   Covid-19: Precautions and sickness symptoms discussed. Wears mask, social distancing, hand hygiene as appropriate.   Activities of Daily Living Patient denies needing assistance with: household chores, feeding themselves, getting from bed to chair, getting to the toilet, bathing/showering, dressing, managing money, or preparing meals.  Daughter assists with home management as needed.  Memory: Patient is alert. Patient denies difficulty focusing or concentrating. Correctly identified the president of the [place], season and recall.  BMI- discussed the importance of a healthy diet, water intake and the benefits of aerobic exercise.  Educational material provided.  Physical activity- walking. No routine.   Diet:  Low Carb Fluids: 32 restriction Caffeine: 1 cup coffee  Advanced Directive: End of life planning; Advanced aging; Advanced directives discussed.  No HCPOA/Living Will.  Additional information declined at this time.  Other Providers Patient Care Team: Leone Haven, MD as PCP - General (Family Medicine) Martinique, Peter M, MD as PCP - Cardiology (Cardiology) Rexene Agent, MD as Attending Physician (Nephrology) Martinique, Peter M, MD as Consulting Physician (Cardiology) [facility] Hearing/Vision screen  Hearing Screening   125Hz  250Hz  500Hz  1000Hz  2000Hz  3000Hz  4000Hz  6000Hz  8000Hz   Right ear:           Left ear:           Comments: Patient is able to hear conversational tones without difficulty.  No issues  reported.  Vision Screening Comments: Visual acuity not assessed, virtual visit.      Dietary issues and exercise activities discussed: Current Exercise Habits: Home exercise routine, Type of exercise: walking, Intensity: Mild  Goals    . Follow up with Primary Care Provider     Complete  health maintenance gaps       Depression Screen PHQ 2/9 Scores 01/01/2019 04/29/2018 03/24/2018 08/03/2016  PHQ - 2 Score 0 3 0 0    Fall Risk Fall Risk  01/01/2019 04/29/2018 03/24/2018 08/03/2016  Falls in the past year? 0 0 0 No  Number falls in past yr: - 0 0 -  Injury with Fall? - 0 0 -    Timed Get Up and Go Performed no, virtual visit  Cognitive Function:     6CIT Screen 01/01/2019  What Year? 0 points  What month? 0 points  What time? 0 points  Count back from 20 0 points  Months in reverse 0 points  Repeat phrase 0 points  Total Score 0    Screening Tests Health Maintenance  Topic Date Due  . URINE MICROALBUMIN  04/14/1951  . OPHTHALMOLOGY EXAM  12/19/2017  . HEMOGLOBIN A1C  09/02/2018  . INFLUENZA VACCINE  07/15/2019 (Originally 11/15/2018)  . DEXA SCAN  01/01/2020 (Originally 04/14/2008)  . COLONOSCOPY  01/01/2020 (Originally 04/14/1993)  . TETANUS/TDAP  01/01/2020 (Originally 04/14/1962)  . FOOT EXAM  04/03/2019  . PNA vac Low Risk Adult (2 of 2 - PCV13) 04/22/2019     Plan:   Keep all routine maintenance appointments.   Medicare Attestation I have personally reviewed: The patient's medical and social history Their use of alcohol, tobacco or illicit drugs Their current medications and supplements The patient's functional ability including ADLs,fall risks, home safety risks, cognitive, and hearing and visual impairment Diet and physical activities Evidence for depression   In addition, I have reviewed and discussed with patient certain preventive protocols, quality metrics, and best practice recommendations. A written personalized care plan for preventive services as  well as general preventive health recommendations were provided to patient via mail.     Varney Biles, LPN   1/70/0174

## 2019-01-02 NOTE — Telephone Encounter (Signed)
I put form in your sign basket.  Nina,cma

## 2019-01-02 NOTE — Telephone Encounter (Signed)
Please call the patient to confirm that she uses portable oxygen. If she does not please see if she uses an assistive device to help her walk or if she can walk more than 200 feet with out stopping. Thanks.

## 2019-01-02 NOTE — Telephone Encounter (Signed)
Pt called to request an update regarding her paper work. Pt states she does use a portable oxygen tank. Pt states she is leaving for out of town tomorrow. Please advise.

## 2019-01-05 NOTE — Telephone Encounter (Signed)
called to request an update regarding her paper work. Pt states she does use a portable oxygen tank.  Odette Watanabe,cma

## 2019-01-06 NOTE — Telephone Encounter (Signed)
Completed. Please fill in the office information and make available for patient pick up. Thanks.

## 2019-01-07 NOTE — Telephone Encounter (Signed)
Called patient and informed her that her handicap placard form is ready for pickup, she states her daughter will pick up the form for her.  Micahel Omlor,cma

## 2020-01-04 ENCOUNTER — Ambulatory Visit: Payer: Medicare Other

## 2020-02-15 DEATH — deceased
